# Patient Record
Sex: Female | Born: 1935 | Race: White | Hispanic: No | Marital: Married | Smoking: Never smoker
Health system: Southern US, Community
[De-identification: ages and names within clinical notes are randomized; demographics above are authoritative.]

## PROBLEM LIST (undated history)

## (undated) DIAGNOSIS — R29898 Other symptoms and signs involving the musculoskeletal system: Secondary | ICD-10-CM

## (undated) DIAGNOSIS — T8859XA Other complications of anesthesia, initial encounter: Secondary | ICD-10-CM

## (undated) DIAGNOSIS — I5020 Unspecified systolic (congestive) heart failure: Secondary | ICD-10-CM

## (undated) DIAGNOSIS — R0609 Other forms of dyspnea: Secondary | ICD-10-CM

## (undated) DIAGNOSIS — H269 Unspecified cataract: Secondary | ICD-10-CM

## (undated) DIAGNOSIS — N189 Chronic kidney disease, unspecified: Secondary | ICD-10-CM

## (undated) DIAGNOSIS — Z7901 Long term (current) use of anticoagulants: Secondary | ICD-10-CM

## (undated) DIAGNOSIS — R0600 Dyspnea, unspecified: Secondary | ICD-10-CM

## (undated) DIAGNOSIS — I499 Cardiac arrhythmia, unspecified: Secondary | ICD-10-CM

## (undated) DIAGNOSIS — E119 Type 2 diabetes mellitus without complications: Secondary | ICD-10-CM

## (undated) DIAGNOSIS — Z9889 Other specified postprocedural states: Secondary | ICD-10-CM

## (undated) DIAGNOSIS — I4891 Unspecified atrial fibrillation: Secondary | ICD-10-CM

## (undated) DIAGNOSIS — M1990 Unspecified osteoarthritis, unspecified site: Secondary | ICD-10-CM

## (undated) DIAGNOSIS — Z87440 Personal history of urinary (tract) infections: Secondary | ICD-10-CM

## (undated) DIAGNOSIS — I509 Heart failure, unspecified: Secondary | ICD-10-CM

## (undated) DIAGNOSIS — Z794 Long term (current) use of insulin: Secondary | ICD-10-CM

## (undated) DIAGNOSIS — B019 Varicella without complication: Secondary | ICD-10-CM

## (undated) DIAGNOSIS — G709 Myoneural disorder, unspecified: Secondary | ICD-10-CM

## (undated) DIAGNOSIS — M48061 Spinal stenosis, lumbar region without neurogenic claudication: Secondary | ICD-10-CM

## (undated) DIAGNOSIS — I700 Atherosclerosis of aorta: Secondary | ICD-10-CM

## (undated) DIAGNOSIS — N184 Chronic kidney disease, stage 4 (severe): Secondary | ICD-10-CM

## (undated) DIAGNOSIS — I447 Left bundle-branch block, unspecified: Secondary | ICD-10-CM

## (undated) DIAGNOSIS — N390 Urinary tract infection, site not specified: Secondary | ICD-10-CM

## (undated) DIAGNOSIS — M519 Unspecified thoracic, thoracolumbar and lumbosacral intervertebral disc disorder: Secondary | ICD-10-CM

## (undated) DIAGNOSIS — E785 Hyperlipidemia, unspecified: Secondary | ICD-10-CM

## (undated) DIAGNOSIS — I480 Paroxysmal atrial fibrillation: Secondary | ICD-10-CM

## (undated) DIAGNOSIS — M4800 Spinal stenosis, site unspecified: Secondary | ICD-10-CM

## (undated) DIAGNOSIS — R32 Unspecified urinary incontinence: Secondary | ICD-10-CM

## (undated) DIAGNOSIS — M5090 Cervical disc disorder, unspecified, unspecified cervical region: Secondary | ICD-10-CM

## (undated) DIAGNOSIS — T4145XA Adverse effect of unspecified anesthetic, initial encounter: Secondary | ICD-10-CM

## (undated) DIAGNOSIS — I10 Essential (primary) hypertension: Secondary | ICD-10-CM

## (undated) DIAGNOSIS — T884XXA Failed or difficult intubation, initial encounter: Secondary | ICD-10-CM

## (undated) DIAGNOSIS — R112 Nausea with vomiting, unspecified: Secondary | ICD-10-CM

## (undated) DIAGNOSIS — Z79891 Long term (current) use of opiate analgesic: Secondary | ICD-10-CM

## (undated) DIAGNOSIS — M5430 Sciatica, unspecified side: Secondary | ICD-10-CM

## (undated) DIAGNOSIS — K439 Ventral hernia without obstruction or gangrene: Secondary | ICD-10-CM

## (undated) DIAGNOSIS — D649 Anemia, unspecified: Secondary | ICD-10-CM

## (undated) HISTORY — PX: CATARACT EXTRACTION, BILATERAL: SHX1313

## (undated) HISTORY — PX: EYE SURGERY: SHX253

## (undated) HISTORY — PX: SHOULDER SURGERY: SHX246

## (undated) HISTORY — PX: CARDIOVERSION: SHX1299

## (undated) HISTORY — PX: TONSILLECTOMY: SUR1361

## (undated) HISTORY — PX: JOINT REPLACEMENT: SHX530

## (undated) HISTORY — PX: CERVICAL LAMINECTOMY: SHX94

## (undated) HISTORY — PX: BACK SURGERY: SHX140

---
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
## 2005-05-07 ENCOUNTER — Ambulatory Visit: Payer: Self-pay | Admitting: Internal Medicine

## 2005-06-16 ENCOUNTER — Ambulatory Visit: Payer: Self-pay | Admitting: Internal Medicine

## 2006-08-04 ENCOUNTER — Ambulatory Visit: Payer: Self-pay | Admitting: Internal Medicine

## 2007-04-21 ENCOUNTER — Ambulatory Visit: Payer: Self-pay | Admitting: Internal Medicine

## 2007-11-10 ENCOUNTER — Ambulatory Visit: Payer: Self-pay | Admitting: Internal Medicine

## 2008-01-11 ENCOUNTER — Other Ambulatory Visit: Payer: Self-pay

## 2008-01-11 ENCOUNTER — Ambulatory Visit: Payer: Self-pay | Admitting: Unknown Physician Specialty

## 2008-01-17 ENCOUNTER — Ambulatory Visit: Payer: Self-pay | Admitting: Unknown Physician Specialty

## 2008-12-28 HISTORY — PX: ROTATOR CUFF REPAIR: SHX139

## 2009-06-20 ENCOUNTER — Ambulatory Visit: Payer: Self-pay | Admitting: Internal Medicine

## 2009-12-11 ENCOUNTER — Ambulatory Visit: Payer: Self-pay | Admitting: Ophthalmology

## 2010-01-15 ENCOUNTER — Ambulatory Visit: Payer: Self-pay | Admitting: Internal Medicine

## 2010-01-28 HISTORY — PX: LATERAL FUSION LUMBAR SPINE, TRANSVERSE: SUR635

## 2010-02-07 ENCOUNTER — Ambulatory Visit: Payer: Self-pay | Admitting: Unknown Physician Specialty

## 2010-02-13 ENCOUNTER — Inpatient Hospital Stay: Payer: Self-pay | Admitting: Unknown Physician Specialty

## 2010-07-10 ENCOUNTER — Ambulatory Visit: Payer: Self-pay | Admitting: Internal Medicine

## 2011-10-01 ENCOUNTER — Ambulatory Visit: Payer: Self-pay | Admitting: Internal Medicine

## 2012-04-19 ENCOUNTER — Inpatient Hospital Stay: Payer: Self-pay | Admitting: Internal Medicine

## 2012-04-19 LAB — PROTIME-INR: INR: 0.9

## 2012-04-19 LAB — COMPREHENSIVE METABOLIC PANEL
Albumin: 3.8 g/dL (ref 3.4–5.0)
BUN: 25 mg/dL — ABNORMAL HIGH (ref 7–18)
Bilirubin,Total: 0.8 mg/dL (ref 0.2–1.0)
Calcium, Total: 9 mg/dL (ref 8.5–10.1)
Co2: 28 mmol/L (ref 21–32)
Creatinine: 1 mg/dL (ref 0.60–1.30)
EGFR (African American): >60
Glucose: 183 mg/dL — ABNORMAL HIGH (ref 65–99)
Osmolality: 285 (ref 275–301)
Potassium: 4 mmol/L (ref 3.5–5.1)
SGOT(AST): 9 U/L — ABNORMAL LOW (ref 15–37)
Sodium: 138 mmol/L (ref 136–145)

## 2012-04-19 LAB — TROPONIN I
Troponin-I: <0.02 ng/mL
Troponin-I: <0.02 ng/mL

## 2012-04-19 LAB — TSH: Thyroid Stimulating Horm: 0.886 u[IU]/mL

## 2012-04-19 LAB — CBC WITH DIFFERENTIAL/PLATELET
Basophil #: 0.1 10*3/uL (ref 0.0–0.1)
Eosinophil #: 0.4 10*3/uL (ref 0.0–0.7)
Lymphocyte #: 2.2 10*3/uL (ref 1.0–3.6)
MCH: 26.9 pg (ref 26.0–34.0)
MCV: 82 fL (ref 80–100)
Neutrophil #: 17.5 10*3/uL — ABNORMAL HIGH (ref 1.4–6.5)
RDW: 15.7 % — ABNORMAL HIGH (ref 11.5–14.5)
WBC: 21.4 10*3/uL — ABNORMAL HIGH (ref 3.6–11.0)

## 2012-04-19 LAB — APTT: Activated PTT: 24.9 secs (ref 23.6–35.9)

## 2012-04-19 LAB — MAGNESIUM: Magnesium: 1.6 mg/dL — ABNORMAL LOW

## 2012-04-19 LAB — CK TOTAL AND CKMB (NOT AT ARMC): CK, Total: 14 U/L — ABNORMAL LOW (ref 21–215)

## 2012-04-20 LAB — BASIC METABOLIC PANEL
Anion Gap: 8 (ref 7–16)
Calcium, Total: 8.7 mg/dL (ref 8.5–10.1)
Chloride: 106 mmol/L (ref 98–107)
Co2: 26 mmol/L (ref 21–32)
Creatinine: 0.86 mg/dL (ref 0.60–1.30)
EGFR (African American): >60
Glucose: 163 mg/dL — ABNORMAL HIGH (ref 65–99)
Sodium: 140 mmol/L (ref 136–145)

## 2012-04-20 LAB — URINALYSIS, COMPLETE
Bacteria: NONE SEEN
Blood: NEGATIVE
Glucose,UR: NEGATIVE mg/dL (ref 0–75)
Ketone: NEGATIVE
Leukocyte Esterase: NEGATIVE
Nitrite: NEGATIVE
RBC,UR: NONE SEEN /HPF (ref 0–5)
Specific Gravity: 1.008 (ref 1.003–1.030)
Squamous Epithelial: <1
WBC UR: 1 /HPF (ref 0–5)

## 2012-04-20 LAB — CBC WITH DIFFERENTIAL/PLATELET
Basophil %: 0.6 %
Eosinophil #: 0.5 10*3/uL (ref 0.0–0.7)
Lymphocyte #: 2.8 10*3/uL (ref 1.0–3.6)
Lymphocyte %: 18.9 %
MCH: 27.3 pg (ref 26.0–34.0)
MCHC: 33.3 g/dL (ref 32.0–36.0)
Monocyte #: 0.8 x10 3/mm (ref 0.2–0.9)
Monocyte %: 5.3 %
Neutrophil #: 10.8 10*3/uL — ABNORMAL HIGH (ref 1.4–6.5)
Neutrophil %: 71.7 %

## 2012-04-20 LAB — CK TOTAL AND CKMB (NOT AT ARMC)
CK, Total: 16 U/L — ABNORMAL LOW (ref 21–215)
CK-MB: <0.5 ng/mL — ABNORMAL LOW (ref 0.5–3.6)

## 2012-04-20 LAB — TROPONIN I: Troponin-I: <0.02 ng/mL

## 2012-04-21 LAB — CBC WITH DIFFERENTIAL/PLATELET
Basophil #: 0.1 10*3/uL (ref 0.0–0.1)
Basophil %: 1 %
Eosinophil #: 0.3 10*3/uL (ref 0.0–0.7)
Eosinophil %: 4.5 %
HCT: 34.2 % — ABNORMAL LOW (ref 35.0–47.0)
HGB: 11.6 g/dL — ABNORMAL LOW (ref 12.0–16.0)
Monocyte #: 0.5 x10 3/mm (ref 0.2–0.9)
Monocyte %: 7.4 %
Neutrophil #: 3.5 10*3/uL (ref 1.4–6.5)
Neutrophil %: 51.3 %
Platelet: 180 10*3/uL (ref 150–440)
RBC: 4.15 10*6/uL (ref 3.80–5.20)

## 2012-05-17 ENCOUNTER — Ambulatory Visit: Payer: Self-pay | Admitting: Cardiology

## 2012-06-14 ENCOUNTER — Ambulatory Visit: Payer: Self-pay | Admitting: Internal Medicine

## 2013-01-11 ENCOUNTER — Ambulatory Visit: Payer: Self-pay | Admitting: Internal Medicine

## 2014-01-17 ENCOUNTER — Ambulatory Visit: Payer: Self-pay | Admitting: Internal Medicine

## 2015-01-24 DIAGNOSIS — N186 End stage renal disease: Secondary | ICD-10-CM

## 2015-02-01 ENCOUNTER — Ambulatory Visit: Payer: Self-pay | Admitting: Cardiology

## 2015-02-06 ENCOUNTER — Ambulatory Visit: Payer: Self-pay | Admitting: Internal Medicine

## 2015-02-27 DIAGNOSIS — I2510 Atherosclerotic heart disease of native coronary artery without angina pectoris: Secondary | ICD-10-CM

## 2015-02-27 HISTORY — DX: Atherosclerotic heart disease of native coronary artery without angina pectoris: I25.10

## 2015-03-13 ENCOUNTER — Ambulatory Visit: Payer: Self-pay | Admitting: Cardiology

## 2015-03-13 HISTORY — PX: LEFT HEART CATH AND CORONARY ANGIOGRAPHY: CATH118249

## 2015-04-17 ENCOUNTER — Ambulatory Visit
Admit: 2015-04-17 | Disposition: A | Discharge status: Home / Self Care | Payer: Self-pay | Attending: Rheumatology | Admitting: Rheumatology

## 2015-04-21 NOTE — Consult Note (Signed)
General Aspect patient is a 81 year old female with history of diabetes, hyperlipidemia and hypertension as well as spinal stenosis who was admitted with atrial fibrillation with rapid ventricular response.  Patient said palpitations the past but no sustained atrial fibrillation and was able to be documented.  Her husband was a physician has noted irregular heartbeats in the past, however prior to getting to where elective cartographic to be taken, the patient always converted to sinus rhythm.  She currently remains in atrial fibrillation with controlled ventricular response.  She has no obvious contraindications to anticoagulation.  She is ruled out for myocardial infarction is currently stable however feels fatigue.  She states his fatigue has been present for several days to week.   Physical Exam:   GEN no acute distress    HEENT PERRL    NECK supple  No masses    RESP clear BS    CARD Irregular rate and rhythm  Tachycardic  Murmur    Murmur Systolic    Systolic Murmur axilla    ABD denies tenderness  normal BS    LYMPH negative neck, negative axillae    EXTR negative cyanosis/clubbing, negative edema    SKIN normal to palpation    NEURO cranial nerves intact, motor/sensory function intact    PSYCH A+O to time, place, person   Review of Systems:   Subjective/Chief Complaint weakness, fatigue and rapid heart rate    General: Fatigue  Weakness    Skin: No Complaints    ENT: No Complaints    Eyes: No Complaints    Neck: No Complaints    Respiratory: Short of breath    Cardiovascular: Palpitations    Gastrointestinal: No Complaints    Genitourinary: No Complaints    Vascular: No Complaints    Musculoskeletal: No Complaints    Neurologic: No Complaints    Hematologic: No Complaints    Endocrine: No Complaints    Psychiatric: No Complaints    Review of Systems: All other systems were reviewed and found to be negative    Medications/Allergies Reviewed  Medications/Allergies reviewed     Hypertension:    Back Pain, Chronic:    Diabetes Mellitus, Type II (NIDD):    Cervical Disk Surgery:    Rotator Cuff Surgery:    Cataract Extraction:   EKG:   Interpretation aatrial fibrillation with variable ventricular response    Codeine: N/V/Diarrhea    Impression 81 yo  female with no documented atrial fibrillation who was admitted with atrial fibrillation with variable ventricular response.  Patient states she has been weak and fatigued for several days.  Today she noted rapid irregular heartbeat.  She presented to her primary care provider's office and was noted B&H fibrillation with rapid ventricular response.  She was admitted for rate control and further workup of the etiology of her atrial fibrillation.  She does have some weakness and fatigue and mild shortness of breath but no obvious chest discomfort.  She is ruled out for myocardial infarction thus far.  We'll need to evaluate for evidence of structural and/or valvular and/or ischemic heart disease.  She was an echocardiogram as well as a functional study will continue control rate with beta blockers and consider chronic anticoagulation with Coumadin versus newer anticoagulant agents.    Plan 1.  Continue to control rate as you're doing 2.  IV heparin with conversion to oral Xarelto at time of discharge 3.  Echocardiogram to evaluate for structural or valvular disease 4.  Functional study to  assess for ischemic etiology of symptoms 5.  Rule out for myocardial infarction 6.  Further recommendations pending patient's course   Electronic Signatures: Teodoro Spray (MD)  (Signed 15-May-13 09:34)  Authored: General Aspect/Present Illness, History and Physical Exam, Review of System, Past Medical History, EKG , Allergies, Impression/Plan   Last Updated: 15-May-13 09:34 by Teodoro Spray (MD)

## 2015-04-21 NOTE — Discharge Summary (Signed)
PATIENT NAME:  Tiffany [name], Tiffany [name] MR#:  [id number] DATE OF BIRTH:  10/31/1933  DATE OF ADMISSION:  04/19/2012 DATE OF DISCHARGE:  04/21/2012   FINAL DIAGNOSES:  1. Atrial fibrillation with rapid ventricular rate.  2. Hypertension.  3. Hyperlipidemia.  4. Diabetes mellitus, uncontrolled.  5. Spinal stenosis with radiculopathy.   HISTORY AND PHYSICAL: Please see dictated admission history and physical.   Kelly: The patient was admitted with atrial fibrillation, worrisome for unstable angina given the nausea and fatigue that she was experiencing. Cardiac enzymes were followed, which were negative. She underwent echocardiogram which revealed preserved LV function. She underwent Myoview Lexiscan, which was negative for ischemia. Cardiology saw the patient and medications were adjusted for rate control in hopes that she would convert. Her rate improved into the 80's and 90's but she remained in atrial fibrillation. She was initially placed on Lovenox and was then started on Xarelto secondary to her increased stroke risk, [place] score of 3. She tolerated this well. With her heart rate in the 80's, she was able to ambulate with much improved energy level with resolution of the nausea. At this time we will anticipate having her discharged to home in stable condition with her physical activity to be up as tolerated. She will follow a 2 gram sodium, 1800 calorie ADA diet. We will anticipate her following up with Dr. Ubaldo Glassing in the next few weeks. Will need to monitor for any bradycardia should she break into normal sinus rhythm. This was discussed with the patient. The increased risk of bleeding and the lack of an anecdote to Xarelto was also discussed with the patient. She will check her blood sugars daily and record these as her blood sugars were uncontrolled during her hospitalization, however, we had to hold her metformin as we were concerned that she might need to go for cardiac catheterization.    DISCHARGE MEDICATIONS:  1. Norco 5/325 mg 1 p.o. q.6 hours p.r.n. severe pain.  2. Ranitidine 150 mg p.o. daily.  3. Lisinopril 40 mg p.o. b.i.d.  4. Lovastatin 40 mg p.o. at bedtime.  5. Lopressor 100 mg p.o. b.i.d.  6. Digoxin 0.125 mg p.o. daily. 7. Xarelto 20 mg p.o. daily.  8. Metformin 500 mg p.o. b.i.d.   DO NOT TAKE: The patient should stop Macrobid, nabumetone, and amlodipine.   Urinalysis was checked. There was no evidence of urinary tract infection. She was noted to have leukocytosis on admission, however, it was thought that this might represent the overall stress of situation and no other clear etiology was found. The white blood cell count normalized over the course of her hospitalization.  ____________________________ Adin Hector, MD bjk:drc D: 04/21/2012 13:19:48 ET T: 04/21/2012 15:36:20 ET JOB#: [id number]  cc: Adin Hector, MD, <Dictator> Ramonita Lab MD ELECTRONICALLY SIGNED 04/22/2012 13:17

## 2015-04-21 NOTE — Consult Note (Signed)
Brief Consult Note: Diagnosis: atrial fibrillation with lbbb.   Patient was seen by consultant.   Recommend further assessment or treatment.   Discussed with Attending MD.   Comments: Pt with history of brief episode of afib in the past now admitted after presenting to pcp office with weakness and dizziness. Noted to be in afib with fairly rapid ventricular response. Denies chest pain. No prior cardiac disease. Being evaluated for spinal stenosis with consideration for surgery in the near future. Will rule out for mi, proceed with echo to evaluate lv and atrial anatomy. Will proceed with functional study in am if she rules out for am. Will need lexiscan stress due to back pain and lbbb. Agree with lovenox. Will defer chronic oral anticoagulation until decisions regarding invasive evalluiatgion and/or surgical intervention is determined. Will control rate. Full note to follow.  Electronic Signatures: Teodoro Spray (MD)  (Signed 23-Apr-13 13:15)  Authored: Brief Consult Note   Last Updated: 23-Apr-13 13:15 by Teodoro Spray (MD)

## 2015-04-21 NOTE — H&P (Signed)
PATIENT NAME:  Tiffany [name], Tiffany [name] MR#:  [id number] DATE OF BIRTH:  21-Nov-1933  DATE OF ADMISSION:  04/19/2012  CHIEF COMPLAINT: Nausea and fatigue.   HISTORY OF PRESENT ILLNESS: 81 year old female with history of diabetes, hypertension, hyperlipidemia, spinal stenosis, who is undergoing evaluation for spinal stenosis through Dr. Mauri Pole. She has had an episode about a year ago where there was a question of whether she was in transient atrial fibrillation, by the time she arrived to the doctor this had resolved. She has history of left bundle branch block. This morning she awoke feeling generally fatigued, with some nausea, without chest pain. She had significant dyspnea on exertion, actually symptoms started yesterday morning. Her husband, who is a retired Engineer, drilling, checked her heart rate noticed it to be irregular and rapid. She came to the office today for evaluation, where EKG revealed atrial fibrillation with rapid ventricular rate, at 113, with left bundle branch block limiting the interpretation of this otherwise. She is admitted now with new diagnosis of atrial fibrillation as well as possible unstable angina, likely rate related.   PAST MEDICAL HISTORY:  1. Degenerative joint disease with spinal stenosis.  2. Diabetes mellitus.  3. Hypertension.  4. Hyperlipidemia.  5. History of anemia.  6. Recurrent urinary tract infections.  7. History of cervical laminectomy.  8. History of right rotator cuff surgery.  9. History of lumbar laminectomy.  10. History of cataract surgery.   ALLERGIES: Codeine causes unknown reaction.   MEDICATIONS:  1. Macrobid 100 mg p.o. b.i.d.  2. Tylenol as needed.  3. Lovastatin 40 mg p.o. at bedtime.  4. Metformin 500 mg p.o. b.i.d.  5. Lopressor 50 mg p.o. b.i.d.  6. Lisinopril 40 mg p.o. b.i.d.  7. Nabumetone 750 mg p.o. daily as needed for pain.  8. Amlodipine 5 mg p.o. daily.  9. Lasix 20 mg p.o. daily as needed for edema.  10. Norco 5/325 mg, 1 p.o.  every six hours p.r.n. severe pain.   SOCIAL HISTORY: No tobacco or alcohol.   FAMILY HISTORY: Patient is adopted.   REVIEW OF SYSTEMS: Please see history of present illness. No vision changes; no dysphagia; no neurologic changes; no bowel or bladder issues. Remainder of complete review of systems is negative.   PHYSICAL EXAMINATION:  VITAL SIGNS: Blood pressure 140/90, temperature 98.8, pulse 113, saturation 97% on room air.   GENERAL: Well-developed, well-nourished female, appears fatigued.   EYES: Pupils round and reactive to light. Extraocular motion intact.   EARS, NOSE, AND THROAT: External examination unremarkable. Oropharynx is moist without lesions.   NECK: Supple. Trachea midline. No thyromegaly.   CARDIOVASCULAR: Irregularly irregular without murmurs, gallops, or rubs. Carotid and radial pulses 2+.   LUNGS: Clear bilaterally. No retractions.   ABDOMEN: Soft, nontender, nondistended. Positive bowel sounds. No guard or rebound.   EXTREMITIES: No clubbing, cyanosis, or significant edema. Arthritic changes present.   NEUROLOGIC: Cranial nerves intact. Right leg antalgia with decreased light touch in the right lower extremity.   DERMATOLOGIC: No significant rashes or nodules.  LYMPH: No cervical or supraclavicular nodes.   IMPRESSION AND PLAN:  1. Atrial fibrillation with rapid ventricular rate/unstable angina. Will increase Lopressor to t.i.d. see if we can break the rhythm. Echocardiogram to be obtained. Will ask cardiology to see her as we need to consider stress testing in the event that she does wind up needing to go for surgery as well as to further investigate source of her atrial fibrillation. Will get electrolytes, thyroid and replace or  adjust any treatment that might be needed based on this.  2. Hypertension. Will hold amlodipine while we adjust the above, allowing [place] to use Cardizem if we need to add this.  3. Diabetes mellitus. Hold metformin in the event that  she winds up needing cardiac catheterization. Cover with sliding scale insulin.  4. History of anemia. Await her CBC. We will tentatively plan to put her on Lovenox for now and will readdress anticoagulation based on blood counts as well as findings on the echocardiogram.  5. Spinal stenosis. Pain control.   ____________________________ Adin Hector, MD bjk:cms D: 04/19/2012 10:24:58 ET T: 04/19/2012 10:53:43 ET JOB#: [id number]  cc: Adin Hector, MD, <Dictator> Ramonita Lab MD ELECTRONICALLY SIGNED 04/22/2012 13:17

## 2015-04-24 DIAGNOSIS — M5416 Radiculopathy, lumbar region: Secondary | ICD-10-CM | POA: Insufficient documentation

## 2016-01-30 DIAGNOSIS — N183 Chronic kidney disease, stage 3 (moderate): Secondary | ICD-10-CM | POA: Diagnosis not present

## 2016-01-30 DIAGNOSIS — M4806 Spinal stenosis, lumbar region: Secondary | ICD-10-CM | POA: Diagnosis not present

## 2016-01-30 DIAGNOSIS — D649 Anemia, unspecified: Secondary | ICD-10-CM | POA: Diagnosis not present

## 2016-01-30 DIAGNOSIS — I10 Essential (primary) hypertension: Secondary | ICD-10-CM | POA: Diagnosis not present

## 2016-01-30 DIAGNOSIS — I480 Paroxysmal atrial fibrillation: Secondary | ICD-10-CM | POA: Diagnosis not present

## 2016-01-30 DIAGNOSIS — E782 Mixed hyperlipidemia: Secondary | ICD-10-CM | POA: Diagnosis not present

## 2016-01-30 DIAGNOSIS — E119 Type 2 diabetes mellitus without complications: Secondary | ICD-10-CM | POA: Diagnosis not present

## 2016-04-02 DIAGNOSIS — M5136 Other intervertebral disc degeneration, lumbar region: Secondary | ICD-10-CM | POA: Diagnosis not present

## 2016-04-02 DIAGNOSIS — M4806 Spinal stenosis, lumbar region: Secondary | ICD-10-CM | POA: Diagnosis not present

## 2016-04-02 DIAGNOSIS — M5416 Radiculopathy, lumbar region: Secondary | ICD-10-CM | POA: Diagnosis not present

## 2016-06-03 DIAGNOSIS — N183 Chronic kidney disease, stage 3 (moderate): Secondary | ICD-10-CM | POA: Diagnosis not present

## 2016-06-03 DIAGNOSIS — D649 Anemia, unspecified: Secondary | ICD-10-CM | POA: Diagnosis not present

## 2016-06-03 DIAGNOSIS — E784 Other hyperlipidemia: Secondary | ICD-10-CM | POA: Diagnosis not present

## 2016-06-03 DIAGNOSIS — I480 Paroxysmal atrial fibrillation: Secondary | ICD-10-CM | POA: Diagnosis not present

## 2016-06-03 DIAGNOSIS — E1122 Type 2 diabetes mellitus with diabetic chronic kidney disease: Secondary | ICD-10-CM | POA: Diagnosis not present

## 2016-06-03 DIAGNOSIS — I10 Essential (primary) hypertension: Secondary | ICD-10-CM | POA: Diagnosis not present

## 2016-06-03 DIAGNOSIS — Z23 Encounter for immunization: Secondary | ICD-10-CM | POA: Diagnosis not present

## 2016-06-03 DIAGNOSIS — M150 Primary generalized (osteo)arthritis: Secondary | ICD-10-CM | POA: Diagnosis not present

## 2016-06-03 DIAGNOSIS — M4806 Spinal stenosis, lumbar region: Secondary | ICD-10-CM | POA: Diagnosis not present

## 2016-06-03 DIAGNOSIS — M1990 Unspecified osteoarthritis, unspecified site: Secondary | ICD-10-CM | POA: Diagnosis not present

## 2016-07-30 DIAGNOSIS — E1165 Type 2 diabetes mellitus with hyperglycemia: Secondary | ICD-10-CM | POA: Diagnosis not present

## 2016-07-30 DIAGNOSIS — E1122 Type 2 diabetes mellitus with diabetic chronic kidney disease: Secondary | ICD-10-CM | POA: Diagnosis not present

## 2016-07-30 DIAGNOSIS — N183 Chronic kidney disease, stage 3 (moderate): Secondary | ICD-10-CM | POA: Diagnosis not present

## 2016-09-02 DIAGNOSIS — E1122 Type 2 diabetes mellitus with diabetic chronic kidney disease: Secondary | ICD-10-CM | POA: Diagnosis not present

## 2016-09-02 DIAGNOSIS — D649 Anemia, unspecified: Secondary | ICD-10-CM | POA: Diagnosis not present

## 2016-09-02 DIAGNOSIS — M1990 Unspecified osteoarthritis, unspecified site: Secondary | ICD-10-CM | POA: Diagnosis not present

## 2016-09-02 DIAGNOSIS — N183 Chronic kidney disease, stage 3 (moderate): Secondary | ICD-10-CM | POA: Diagnosis not present

## 2016-09-02 DIAGNOSIS — I10 Essential (primary) hypertension: Secondary | ICD-10-CM | POA: Diagnosis not present

## 2016-09-18 DIAGNOSIS — E1122 Type 2 diabetes mellitus with diabetic chronic kidney disease: Secondary | ICD-10-CM | POA: Diagnosis not present

## 2016-09-18 DIAGNOSIS — E1165 Type 2 diabetes mellitus with hyperglycemia: Secondary | ICD-10-CM | POA: Diagnosis not present

## 2016-09-18 DIAGNOSIS — N183 Chronic kidney disease, stage 3 (moderate): Secondary | ICD-10-CM | POA: Diagnosis not present

## 2016-09-29 DIAGNOSIS — Z23 Encounter for immunization: Secondary | ICD-10-CM | POA: Diagnosis not present

## 2016-11-02 DIAGNOSIS — N183 Chronic kidney disease, stage 3 (moderate): Secondary | ICD-10-CM | POA: Diagnosis not present

## 2016-11-02 DIAGNOSIS — E1122 Type 2 diabetes mellitus with diabetic chronic kidney disease: Secondary | ICD-10-CM | POA: Diagnosis not present

## 2016-11-02 DIAGNOSIS — E1165 Type 2 diabetes mellitus with hyperglycemia: Secondary | ICD-10-CM | POA: Diagnosis not present

## 2016-11-09 DIAGNOSIS — E119 Type 2 diabetes mellitus without complications: Secondary | ICD-10-CM | POA: Diagnosis not present

## 2016-11-11 DIAGNOSIS — M064 Inflammatory polyarthropathy: Secondary | ICD-10-CM | POA: Diagnosis not present

## 2016-11-11 DIAGNOSIS — M545 Low back pain: Secondary | ICD-10-CM | POA: Diagnosis not present

## 2016-11-11 DIAGNOSIS — E784 Other hyperlipidemia: Secondary | ICD-10-CM | POA: Diagnosis not present

## 2016-11-11 DIAGNOSIS — N183 Chronic kidney disease, stage 3 (moderate): Secondary | ICD-10-CM | POA: Diagnosis not present

## 2016-11-11 DIAGNOSIS — G8929 Other chronic pain: Secondary | ICD-10-CM | POA: Diagnosis not present

## 2016-11-11 DIAGNOSIS — Z780 Asymptomatic menopausal state: Secondary | ICD-10-CM | POA: Diagnosis not present

## 2016-11-11 DIAGNOSIS — I10 Essential (primary) hypertension: Secondary | ICD-10-CM | POA: Diagnosis not present

## 2016-11-11 DIAGNOSIS — E1122 Type 2 diabetes mellitus with diabetic chronic kidney disease: Secondary | ICD-10-CM | POA: Diagnosis not present

## 2016-11-11 DIAGNOSIS — M25512 Pain in left shoulder: Secondary | ICD-10-CM | POA: Diagnosis not present

## 2016-11-11 DIAGNOSIS — Z0000 Encounter for general adult medical examination without abnormal findings: Secondary | ICD-10-CM | POA: Diagnosis not present

## 2016-11-11 DIAGNOSIS — I480 Paroxysmal atrial fibrillation: Secondary | ICD-10-CM | POA: Diagnosis not present

## 2016-11-11 DIAGNOSIS — D649 Anemia, unspecified: Secondary | ICD-10-CM | POA: Diagnosis not present

## 2016-11-26 DIAGNOSIS — Z780 Asymptomatic menopausal state: Secondary | ICD-10-CM | POA: Diagnosis not present

## 2016-12-04 DIAGNOSIS — M5416 Radiculopathy, lumbar region: Secondary | ICD-10-CM | POA: Diagnosis not present

## 2016-12-04 DIAGNOSIS — M48062 Spinal stenosis, lumbar region with neurogenic claudication: Secondary | ICD-10-CM | POA: Diagnosis not present

## 2016-12-04 DIAGNOSIS — M5136 Other intervertebral disc degeneration, lumbar region: Secondary | ICD-10-CM | POA: Diagnosis not present

## 2016-12-30 DIAGNOSIS — E1165 Type 2 diabetes mellitus with hyperglycemia: Secondary | ICD-10-CM | POA: Diagnosis not present

## 2017-01-04 DIAGNOSIS — E1122 Type 2 diabetes mellitus with diabetic chronic kidney disease: Secondary | ICD-10-CM | POA: Diagnosis not present

## 2017-01-04 DIAGNOSIS — Z794 Long term (current) use of insulin: Secondary | ICD-10-CM | POA: Diagnosis not present

## 2017-01-04 DIAGNOSIS — E1165 Type 2 diabetes mellitus with hyperglycemia: Secondary | ICD-10-CM | POA: Diagnosis not present

## 2017-01-04 DIAGNOSIS — N183 Chronic kidney disease, stage 3 (moderate): Secondary | ICD-10-CM | POA: Diagnosis not present

## 2017-01-08 ENCOUNTER — Other Ambulatory Visit: Payer: Self-pay | Admitting: Cardiology

## 2017-01-08 DIAGNOSIS — I10 Essential (primary) hypertension: Secondary | ICD-10-CM | POA: Diagnosis not present

## 2017-01-08 DIAGNOSIS — E784 Other hyperlipidemia: Secondary | ICD-10-CM | POA: Diagnosis not present

## 2017-01-08 DIAGNOSIS — I480 Paroxysmal atrial fibrillation: Secondary | ICD-10-CM | POA: Diagnosis not present

## 2017-01-08 MED ORDER — HEPARIN SODIUM (PORCINE) 1000 UNIT/ML IJ SOLN
INTRAMUSCULAR | Status: AC
  Filled 2017-01-08: qty 1

## 2017-01-08 MED ORDER — CEFAZOLIN IN D5W 1 GM/50ML IV SOLN
INTRAVENOUS | Status: AC
  Filled 2017-01-08: qty 50

## 2017-01-08 MED ORDER — MIDAZOLAM HCL 2 MG/2ML IJ SOLN
INTRAMUSCULAR | Status: AC
Start: 2017-01-08 — End: 2017-01-08
  Filled 2017-01-08: qty 2

## 2017-01-08 MED ORDER — FENTANYL CITRATE (PF) 100 MCG/2ML IJ SOLN
INTRAMUSCULAR | Status: AC
  Filled 2017-01-08: qty 2

## 2017-01-12 ENCOUNTER — Encounter
Admission: RE | Disposition: A | Discharge status: Home / Self Care | Payer: Self-pay | Source: Ambulatory Visit | Attending: Cardiology

## 2017-01-12 ENCOUNTER — Encounter: Payer: Self-pay | Admitting: *Deleted

## 2017-01-12 ENCOUNTER — Ambulatory Visit: Payer: PPO | Admitting: Anesthesiology

## 2017-01-12 ENCOUNTER — Ambulatory Visit
Admission: RE | Admit: 2017-01-12 | Discharge: 2017-01-12 | Disposition: A | Discharge status: Home / Self Care | Payer: PPO | Source: Ambulatory Visit | Attending: Cardiology | Admitting: Cardiology

## 2017-01-12 DIAGNOSIS — Z7901 Long term (current) use of anticoagulants: Secondary | ICD-10-CM | POA: Diagnosis not present

## 2017-01-12 DIAGNOSIS — Z794 Long term (current) use of insulin: Secondary | ICD-10-CM | POA: Insufficient documentation

## 2017-01-12 DIAGNOSIS — E782 Mixed hyperlipidemia: Secondary | ICD-10-CM | POA: Diagnosis not present

## 2017-01-12 DIAGNOSIS — N189 Chronic kidney disease, unspecified: Secondary | ICD-10-CM | POA: Diagnosis not present

## 2017-01-12 DIAGNOSIS — I129 Hypertensive chronic kidney disease with stage 1 through stage 4 chronic kidney disease, or unspecified chronic kidney disease: Secondary | ICD-10-CM | POA: Insufficient documentation

## 2017-01-12 DIAGNOSIS — Z7984 Long term (current) use of oral hypoglycemic drugs: Secondary | ICD-10-CM | POA: Diagnosis not present

## 2017-01-12 DIAGNOSIS — I480 Paroxysmal atrial fibrillation: Secondary | ICD-10-CM | POA: Insufficient documentation

## 2017-01-12 DIAGNOSIS — N183 Chronic kidney disease, stage 3 (moderate): Secondary | ICD-10-CM | POA: Diagnosis not present

## 2017-01-12 DIAGNOSIS — Z79899 Other long term (current) drug therapy: Secondary | ICD-10-CM | POA: Diagnosis not present

## 2017-01-12 DIAGNOSIS — E1122 Type 2 diabetes mellitus with diabetic chronic kidney disease: Secondary | ICD-10-CM | POA: Insufficient documentation

## 2017-01-12 DIAGNOSIS — E785 Hyperlipidemia, unspecified: Secondary | ICD-10-CM | POA: Diagnosis not present

## 2017-01-12 DIAGNOSIS — I4891 Unspecified atrial fibrillation: Secondary | ICD-10-CM | POA: Diagnosis not present

## 2017-01-12 HISTORY — DX: Chronic kidney disease, unspecified: N18.9

## 2017-01-12 HISTORY — DX: Urinary tract infection, site not specified: N39.0

## 2017-01-12 HISTORY — DX: Essential (primary) hypertension: I10

## 2017-01-12 HISTORY — DX: Anemia, unspecified: D64.9

## 2017-01-12 HISTORY — DX: Hyperlipidemia, unspecified: E78.5

## 2017-01-12 HISTORY — DX: Varicella without complication: B01.9

## 2017-01-12 HISTORY — DX: Cardiac arrhythmia, unspecified: I49.9

## 2017-01-12 HISTORY — DX: Unspecified osteoarthritis, unspecified site: M19.90

## 2017-01-12 HISTORY — PX: ELECTROPHYSIOLOGIC STUDY: SHX172A

## 2017-01-12 HISTORY — DX: Other symptoms and signs involving the musculoskeletal system: R29.898

## 2017-01-12 HISTORY — DX: Unspecified cataract: H26.9

## 2017-01-12 HISTORY — DX: Spinal stenosis, site unspecified: M48.00

## 2017-01-12 HISTORY — DX: Type 2 diabetes mellitus without complications: E11.9

## 2017-01-12 HISTORY — DX: Unspecified atrial fibrillation: I48.91

## 2017-01-12 SURGERY — CARDIOVERSION (CATH LAB)
Anesthesia: General

## 2017-01-12 MED ORDER — SODIUM CHLORIDE 0.9 % IV SOLN
250.0000 mL | INTRAVENOUS | Status: DC
  Administered 2017-01-12: 07:00:00 via INTRAVENOUS

## 2017-01-12 MED ORDER — PROPOFOL 500 MG/50ML IV EMUL
INTRAVENOUS | Status: AC
Start: 2017-01-12 — End: 2017-01-12
  Filled 2017-01-12: qty 50

## 2017-01-12 MED ORDER — PROPOFOL 10 MG/ML IV BOLUS
INTRAVENOUS | Status: AC
  Filled 2017-01-12: qty 20

## 2017-01-12 MED ORDER — HYDROCORTISONE 1 % EX CREA
1.0000 "application " | TOPICAL_CREAM | Freq: Three times a day (TID) | CUTANEOUS | Status: DC | PRN
  Filled 2017-01-12: qty 28

## 2017-01-12 MED ORDER — PROPOFOL 10 MG/ML IV BOLUS
INTRAVENOUS | Status: DC | PRN
  Administered 2017-01-12: 50 mg via INTRAVENOUS

## 2017-01-12 MED ORDER — SODIUM CHLORIDE 0.9% FLUSH: 3.0000 mL | INTRAVENOUS | Status: DC | PRN

## 2017-01-12 MED ORDER — SODIUM CHLORIDE 0.9% FLUSH: 3.0000 mL | Freq: Two times a day (BID) | INTRAVENOUS | Status: DC

## 2017-01-12 NOTE — Transfer of Care (Signed)
Immediate Anesthesia Transfer of Care Note  Patient: Tiffany [name]  Procedure(s) Performed: Procedure(s): Cardioversion (N/A)  Patient Location: PACU  Anesthesia Type:General  Level of Consciousness: awake and sedated  Airway & Oxygen Therapy: Patient Spontanous Breathing and Patient connected to nasal cannula oxygen  Post-op Assessment: Report given to RN and Post -op Vital signs reviewed and stable  Post vital signs: Reviewed and stable  Last Vitals:  Vitals:   01/12/17 0717  BP: (!) 149/90  Pulse: 72  Resp: 16    Last Pain:  Vitals:   01/12/17 0713  TempSrc: Oral         Complications: No apparent anesthesia complications

## 2017-01-12 NOTE — CV Procedure (Signed)
Cardioversion  Procedure:  DCCV Indication:  Symptomatic afib Sedation:  Per dept of anesthesia  After informed consent, time out protocol and adequate sedation, pt received a 150J synchronized Biphasic DC shock  With conversion to nsr. No immediate complications.

## 2017-01-12 NOTE — Anesthesia Preprocedure Evaluation (Signed)
Anesthesia Evaluation  Patient identified by MRN, date of birth, ID band Patient awake    Reviewed: Allergy & Precautions, H&P , NPO status , Patient's Chart, lab work & pertinent test results  History of Anesthesia Complications Negative for: history of anesthetic complications  Airway Mallampati: III  TM Distance: <3 FB Neck ROM: limited    Dental  (+) Poor Dentition, Chipped, Caps   Pulmonary neg pulmonary ROS, neg shortness of breath,    Pulmonary exam normal breath sounds clear to auscultation       Cardiovascular Exercise Tolerance: Good hypertension, (-) angina(-) Past MI and (-) DOE Normal cardiovascular exam+ dysrhythmias Atrial Fibrillation  Rhythm:regular Rate:Normal     Neuro/Psych negative neurological ROS  negative psych ROS   GI/Hepatic negative GI ROS, Neg liver ROS,   Endo/Other  diabetes, Type 2, Insulin Dependent  Renal/GU Renal disease  negative genitourinary   Musculoskeletal  (+) Arthritis ,   Abdominal   Peds  Hematology negative hematology ROS (+)   Anesthesia Other Findings Past Medical History: No date: A-fib (HCC) No date: Anemia No date: Arthritis No date: Cataracts, both eyes No date: Chickenpox No date: Chronic kidney disease No date: Diabetes mellitus without complication (HCC) No date: Dysrhythmia No date: Hyperlipidemia No date: Hypertension No date: Right leg weakness No date: Spinal stenosis No date: UTI (urinary tract infection)  Past Surgical History: No date: BACK SURGERY No date: CARDIOVERSION No date: CATARACT EXTRACTION, BILATERAL No date: CERVICAL LAMINECTOMY No date: SHOULDER SURGERY  BMI    Body Mass Index:  31.76 kg/m      Reproductive/Obstetrics negative OB ROS                             Anesthesia Physical Anesthesia Plan  ASA: III  Anesthesia Plan: General   Post-op Pain Management:    Induction:   Airway  Management Planned:   Additional Equipment:   Intra-op Plan:   Post-operative Plan:   Informed Consent: I have reviewed the patients History and Physical, chart, labs and discussed the procedure including the risks, benefits and alternatives for the proposed anesthesia with the patient or authorized representative who has indicated his/her understanding and acceptance.   Dental Advisory Given  Plan Discussed with: Anesthesiologist, CRNA and Surgeon  Anesthesia Plan Comments:         Anesthesia Quick Evaluation

## 2017-01-12 NOTE — Anesthesia Post-op Follow-up Note (Signed)
Anesthesia QCDR form completed.        

## 2017-01-12 NOTE — OR Nursing (Signed)
2 second pauses noted, Dr Ubaldo Glassing notified, continue to monitor for extended time, may discharge at 9 if pt is stable and rhythm remains unchanged

## 2017-01-12 NOTE — Anesthesia Procedure Notes (Signed)
Date/Time: 01/12/2017 7:27 AM Performed by: Nelda Marseille Pre-anesthesia Checklist: Patient identified, Emergency Drugs available, Suction available, Patient being monitored and Timeout performed Oxygen Delivery Method: Nasal cannula

## 2017-01-12 NOTE — Addendum Note (Signed)
Addendum  created 01/12/17 1212 by Nelda Marseille, CRNA   Anesthesia Event edited

## 2017-01-12 NOTE — Anesthesia Postprocedure Evaluation (Signed)
Anesthesia Post Note  Patient: Tiffany [name]  Procedure(s) Performed: Procedure(s) (LRB): Cardioversion (N/A)  Patient location during evaluation: Cath Lab Anesthesia Type: General Level of consciousness: awake and alert Pain management: pain level controlled Vital Signs Assessment: post-procedure vital signs reviewed and stable Respiratory status: spontaneous breathing, nonlabored ventilation, respiratory function stable and patient connected to nasal cannula oxygen Cardiovascular status: blood pressure returned to baseline and stable Postop Assessment: no signs of nausea or vomiting Anesthetic complications: no     Last Vitals:  Vitals:   01/12/17 0845 01/12/17 0900  BP:  (!) 153/66  Pulse: (!) 58 (!) 59  Resp: 17 17    Last Pain:  Vitals:   01/12/17 0713  TempSrc: Oral                 Precious Haws Isidore Margraf

## 2017-01-12 NOTE — H&P (Signed)
Chief Complaint: Chief Complaint  Patient presents with  . Atrial Fibrillation  woke up with it  . Fatigue  when I have the afib Im just weak  . Shortness of Breath  I do have alittle  Date of Service: 01/08/2017 Date of Birth: [birth year] PCP: Cheryll Cockayne, MD  History of Present Illness: Tiffany [name] is a 83 y.o.female patient who returns for a: Follow-up visit. She has been in sinus rhythm after cardioversion on low-dose flecainide which she has been on for some time along with beta-blocker. She felt somewhat weak and felt that she may be back in atrial fibrillation. Electrocardiogram today reveals atrial fibrillation at a rate of 61 with a QRS duration 164 ms a QTC of 450 ms with QRS axis -39. She denies syncope chest pain but does have some weakness and fatigue. Her rate is well controlled.  Past Medical and Surgical History  Past Medical History Past Medical History:  Diagnosis Date  . Anemia  . Atrial fibrillation (CMS-HCC)  . Bilateral cataracts  . Chickenpox  . Chronic kidney disease, stage III  . DOE (dyspnea on exertion) 02/04/2015  . Hyperlipidemia  . Hypertension  . Osteoarthritis  . Right leg weakness  . Spinal stenosis  . Type 2 diabetes mellitus (CMS-HCC)  . Urinary tract bacterial infections   Past Surgical History She has a past surgical history that includes Back surgery (02/03/10); Shoulder arthroscopy distal clavicle excision and open rotator cuff repair; Cataract extraction (Bilateral); Cervical laminectomy; and Cardioversion External.   Medications and Allergies  Current Medications  Current Outpatient Prescriptions  Medication Sig Dispense Refill  . CONCENTRATED insulin glargine (TOUJEO SOLOSTAR) pen injector (concentration 300 units/mL) Inject 20 Units subcutaneously nightly. 4.5 mL 5  . DIGITEK 125 mcg tablet take 1 tablet by mouth once daily 90 tablet 4  . flecainide (TAMBOCOR) 100 MG tablet take 1/2 tablet by mouth twice a day 60 tablet 1  .  glimepiride (AMARYL) 2 MG tablet Take 1 tablet (2 mg total) by mouth daily with breakfast. 90 tablet 3  . HYDROcodone-acetaminophen (NORCO) 5-325 mg tablet 1/2-1 po bid prn 20 tablet 0  . ibuprofen (ADVIL,MOTRIN) 200 MG tablet Take 200 mg by mouth 2 (two) times daily.  . insulin ASPART (NOVOLOG FLEXPEN) pen injector (concentration 100 units/mL) Take 10 units before supper. Take 10 minutes before the meal. 15 mL 12  . MELATONIN ORAL Take 10 mg by mouth once daily. One at night   . metoprolol succinate (TOPROL-XL) 50 MG XL tablet take 1 tablet by mouth once daily 90 tablet 4  . valsartan-hydrochlorothiazide (DIOVAN-HCT) 320-12.5 mg tablet take 1 tablet by mouth once daily 90 tablet 4  . XARELTO 20 mg tablet take 1 tablet by mouth once daily with BREAKFAST 90 tablet 4  . blood glucose diagnostic (ONETOUCH VERIO) test strip Test TWICE daily 100 each 3   No current facility-administered medications for this visit.   Allergies: Amlodipine and Codeine  Social and Family History  Social History reports that she has never smoked. She has never used smokeless tobacco. She reports that she does not drink alcohol or use drugs.  Family History Family History  Problem Relation Age of Onset  . Adopted: Yes  . Family history unknown: Yes   Review of Systems  Review of Systems  Constitutional: Negative for chills, diaphoresis, fever and weight loss.  HENT: Negative for congestion, ear discharge, hearing loss and tinnitus.  Eyes: Negative for blurred vision.  Respiratory: Negative for cough,  hemoptysis, sputum production, shortness of breath and wheezing.  Cardiovascular: Negative for chest pain, palpitations, orthopnea, claudication, leg swelling and PND.  Gastrointestinal: Negative for abdominal pain, blood in stool, constipation, diarrhea, heartburn, melena, nausea and vomiting.  Genitourinary: Negative for dysuria, frequency, hematuria and urgency.  Musculoskeletal: Negative for back pain, falls,  joint pain and myalgias.  Skin: Negative for itching and rash.  Neurological: Negative for dizziness, tingling, focal weakness, loss of consciousness and headaches.  Endo/Heme/Allergies: Negative for polydipsia. Does not bruise/bleed easily.  Psychiatric/Behavioral: Negative for depression, memory loss and substance abuse. The patient is not nervous/anxious.    Physical Examination   Vitals:BP 110/68  Pulse 84  Resp 12  Ht 162.6 cm (5\' 4" )  Wt 84.4 kg (186 lb)  BMI 31.93 kg/m  Ht:162.6 cm (5\' 4" ) Wt:84.4 kg (186 lb) STM:HDQQ surface area is 1.95 meters squared. Body mass index is 31.93 kg/m.  Wt Readings from Last 3 Encounters:  01/08/17 84.4 kg (186 lb)  01/04/17 84.4 kg (186 lb)  12/04/16 84.4 kg (186 lb)   BP Readings from Last 3 Encounters:  01/08/17 110/68  01/04/17 144/82  12/04/16 (!) 182/72   General appearance appears in no acute distress  Head Mouth and Eye exam Normocephalic, without obvious abnormality, atraumatic Dentition is good Eyes appear anicteric   Neck exam Thyroid: normal  Nodes: no obvious adenopathy  LUNGS Breath Sounds: Normal Percussion: Normal  CARDIOVASCULAR JVP CV wave: no HJR: no Elevation at 90 degrees: None Carotid Pulse: normal pulsation bilaterally Bruit: None Apex: apical impulse normal  Auscultation Rhythm: atrial fibrillation S1: normal S2: normal Clicks: no Rub: no Murmurs: no murmurs  Gallop: None ABDOMEN Liver enlargement: no Pulsatile aorta: no Ascites: no Bruits: no  EXTREMITIES Clubbing: no Edema: trace to 1+ bilateral pedal edema Pulses: peripheral pulses symmetrical Femoral Bruits: no Amputation: no SKIN Rash: no Cyanosis: no Embolic phemonenon: no Bruising: no NEURO Alert and Oriented to person, place and time: yes Non focal: yes  PSYCH: Pt appears to have normal affect  LABS REVIEWED Last 3 CBC results: Lab Results  Component Value Date  WBC 7.3 09/02/2016  WBC 9.3 06/03/2016   WBC 9.5 01/30/2016   Lab Results  Component Value Date  HGB 11.5 (L) 09/02/2016  HGB 12.6 06/03/2016  HGB 12.5 01/30/2016   Lab Results  Component Value Date  HCT 34.0 (L) 09/02/2016  HCT 38.1 06/03/2016  HCT 38.1 01/30/2016   Lab Results  Component Value Date  PLT 244 09/02/2016  PLT 248 06/03/2016  PLT 269 01/30/2016   Lab Results  Component Value Date  CREATININE 1.6 (H) 12/30/2016  BUN 41 (H) 12/30/2016  NA 141 12/30/2016  K 4.3 12/30/2016  CL 102 12/30/2016  CO2 29.4 12/30/2016   Lab Results  Component Value Date  HGBA1C 8.5 (H) 12/30/2016   Lab Results  Component Value Date  ALT 14 09/02/2016  AST 10 09/02/2016  ALKPHOS 56 09/02/2016   Lab Results  Component Value Date  TSH 2.054 06/03/2016   Assessment and Plan   83 y.o. female with  ICD-10-CM ICD-9-CM  1. Essential hypertension-blood pressure is controlled currently with metoprolol as well as valsartan-hydrochlorothiazide. Will continue with this and dash diet. I10 401.9  2. Paroxysmal atrial fibrillation-currently back in recurrent atrial fibrillation with 50 mg twice daily of flecainide and metoprolol . Will continue to anticoagulate with Xarelto. We will proceed with reattempt at cardioversion with further recommendations based on results of this. Will increase flecainide to 100 twice daily. May  need to alter medications as patient gets older due to side effects however for now given her tolerance will continue with this. I48.0 427.31  3. Mixed hyperlipidemia the-continue with diet control E78.2 272.2  4. CKD (chronic kidney disease) stage 3, GFR 30-59 ml/min the-currently stable N18.3 585.3  5. Diabetes mellitus type 2, uncomplicated-continue with metformin and glimepiride with a hemoglobin A1c goal of less than 6. E11.9 250.00   Return in about 4 weeks (around 02/05/2017).  These notes generated with voice recognition software. I apologize for typographical errors.  Sydnee Levans, MD    H  and P reviewed. No changes.

## 2017-01-12 NOTE — Discharge Instructions (Signed)
Electrical Cardioversion, Care After °This sheet gives you information about how to care for yourself after your procedure. Your health care provider may also give you more specific instructions. If you have problems or questions, contact your health care provider. °What can I expect after the procedure? °After the procedure, it is common to have: °· Some redness on the skin where the shocks were given. °Follow these instructions at home: °· Do not drive for 24 hours if you were given a medicine to help you relax (sedative). °· Take over-the-counter and prescription medicines only as told by your health care provider. °· Ask your health care provider how to check your pulse. Check it often. °· Rest for 48 hours after the procedure or as told by your health care provider. °· Avoid or limit your caffeine use as told by your health care provider. °Contact a health care provider if: °· You feel like your heart is beating too quickly or your pulse is not regular. °· You have a serious muscle cramp that does not go away. °Get help right away if: °· You have discomfort in your chest. °· You are dizzy or you feel faint. °· You have trouble breathing or you are short of breath. °· Your speech is slurred. °· You have trouble moving an arm or leg on one side of your body. °· Your fingers or toes turn cold or blue. °This information is not intended to replace advice given to you by your health care provider. Make sure you discuss any questions you have with your health care provider. °Document Released: 10/04/2013 Document Revised: 07/17/2016 Document Reviewed: 06/19/2016 °Elsevier Interactive Patient Education © 2017 [employer] ° °

## 2017-01-18 DIAGNOSIS — I10 Essential (primary) hypertension: Secondary | ICD-10-CM | POA: Diagnosis not present

## 2017-01-18 DIAGNOSIS — I4891 Unspecified atrial fibrillation: Secondary | ICD-10-CM | POA: Diagnosis not present

## 2017-01-18 DIAGNOSIS — E784 Other hyperlipidemia: Secondary | ICD-10-CM | POA: Diagnosis not present

## 2017-01-28 NOTE — Discharge Summary (Signed)
Patient with symptomatic recurrent atrial fibrillation. She was treated with flecainide and anticoagulated with xarelto.  She underwent cardioversion to normal sinus rhythm. Were no complications. Patient was discharged on current home medications. Follow-up in one week

## 2017-03-05 DIAGNOSIS — Z794 Long term (current) use of insulin: Secondary | ICD-10-CM | POA: Diagnosis not present

## 2017-03-05 DIAGNOSIS — N183 Chronic kidney disease, stage 3 (moderate): Secondary | ICD-10-CM | POA: Diagnosis not present

## 2017-03-05 DIAGNOSIS — I10 Essential (primary) hypertension: Secondary | ICD-10-CM | POA: Diagnosis not present

## 2017-03-05 DIAGNOSIS — E1122 Type 2 diabetes mellitus with diabetic chronic kidney disease: Secondary | ICD-10-CM | POA: Diagnosis not present

## 2017-06-17 DIAGNOSIS — Z794 Long term (current) use of insulin: Secondary | ICD-10-CM | POA: Diagnosis not present

## 2017-06-17 DIAGNOSIS — N183 Chronic kidney disease, stage 3 (moderate): Secondary | ICD-10-CM | POA: Diagnosis not present

## 2017-06-17 DIAGNOSIS — E1122 Type 2 diabetes mellitus with diabetic chronic kidney disease: Secondary | ICD-10-CM | POA: Diagnosis not present

## 2017-06-24 DIAGNOSIS — I10 Essential (primary) hypertension: Secondary | ICD-10-CM | POA: Diagnosis not present

## 2017-06-24 DIAGNOSIS — E1122 Type 2 diabetes mellitus with diabetic chronic kidney disease: Secondary | ICD-10-CM | POA: Diagnosis not present

## 2017-06-24 DIAGNOSIS — Z794 Long term (current) use of insulin: Secondary | ICD-10-CM | POA: Diagnosis not present

## 2017-06-24 DIAGNOSIS — N183 Chronic kidney disease, stage 3 (moderate): Secondary | ICD-10-CM | POA: Diagnosis not present

## 2017-08-16 ENCOUNTER — Other Ambulatory Visit: Payer: Self-pay | Admitting: Internal Medicine

## 2017-08-16 DIAGNOSIS — I10 Essential (primary) hypertension: Secondary | ICD-10-CM | POA: Diagnosis not present

## 2017-08-16 DIAGNOSIS — N183 Chronic kidney disease, stage 3 (moderate): Secondary | ICD-10-CM | POA: Diagnosis not present

## 2017-08-16 DIAGNOSIS — W1800XA Striking against unspecified object with subsequent fall, initial encounter: Secondary | ICD-10-CM | POA: Diagnosis not present

## 2017-08-16 DIAGNOSIS — S199XXA Unspecified injury of neck, initial encounter: Secondary | ICD-10-CM | POA: Diagnosis not present

## 2017-08-16 DIAGNOSIS — M542 Cervicalgia: Secondary | ICD-10-CM | POA: Diagnosis not present

## 2017-08-16 DIAGNOSIS — D649 Anemia, unspecified: Secondary | ICD-10-CM | POA: Diagnosis not present

## 2017-08-16 DIAGNOSIS — M48061 Spinal stenosis, lumbar region without neurogenic claudication: Secondary | ICD-10-CM | POA: Diagnosis not present

## 2017-08-16 DIAGNOSIS — I480 Paroxysmal atrial fibrillation: Secondary | ICD-10-CM | POA: Diagnosis not present

## 2017-08-16 DIAGNOSIS — E11621 Type 2 diabetes mellitus with foot ulcer: Secondary | ICD-10-CM | POA: Diagnosis not present

## 2017-08-16 DIAGNOSIS — M064 Inflammatory polyarthropathy: Secondary | ICD-10-CM | POA: Diagnosis not present

## 2017-08-16 DIAGNOSIS — M5416 Radiculopathy, lumbar region: Secondary | ICD-10-CM | POA: Diagnosis not present

## 2017-08-16 DIAGNOSIS — Z1231 Encounter for screening mammogram for malignant neoplasm of breast: Secondary | ICD-10-CM

## 2017-08-16 DIAGNOSIS — Z0000 Encounter for general adult medical examination without abnormal findings: Secondary | ICD-10-CM | POA: Diagnosis not present

## 2017-08-16 DIAGNOSIS — E784 Other hyperlipidemia: Secondary | ICD-10-CM | POA: Diagnosis not present

## 2017-08-16 DIAGNOSIS — E1122 Type 2 diabetes mellitus with diabetic chronic kidney disease: Secondary | ICD-10-CM | POA: Diagnosis not present

## 2017-08-27 ENCOUNTER — Encounter: Payer: Self-pay | Admitting: Radiology

## 2017-08-27 ENCOUNTER — Ambulatory Visit
Admission: RE | Admit: 2017-08-27 | Discharge: 2017-08-27 | Disposition: A | Discharge status: Home / Self Care | Payer: PPO | Source: Ambulatory Visit | Attending: Internal Medicine | Admitting: Internal Medicine

## 2017-08-27 DIAGNOSIS — R928 Other abnormal and inconclusive findings on diagnostic imaging of breast: Secondary | ICD-10-CM | POA: Diagnosis not present

## 2017-08-27 DIAGNOSIS — Z1231 Encounter for screening mammogram for malignant neoplasm of breast: Secondary | ICD-10-CM | POA: Insufficient documentation

## 2017-08-27 DIAGNOSIS — R921 Mammographic calcification found on diagnostic imaging of breast: Secondary | ICD-10-CM | POA: Insufficient documentation

## 2017-09-01 ENCOUNTER — Other Ambulatory Visit: Payer: Self-pay | Admitting: Internal Medicine

## 2017-09-01 DIAGNOSIS — R921 Mammographic calcification found on diagnostic imaging of breast: Secondary | ICD-10-CM

## 2017-09-01 DIAGNOSIS — R928 Other abnormal and inconclusive findings on diagnostic imaging of breast: Secondary | ICD-10-CM

## 2017-09-09 ENCOUNTER — Ambulatory Visit
Admission: RE | Admit: 2017-09-09 | Discharge: 2017-09-09 | Disposition: A | Discharge status: Home / Self Care | Payer: PPO | Source: Ambulatory Visit | Attending: Internal Medicine | Admitting: Internal Medicine

## 2017-09-09 ENCOUNTER — Other Ambulatory Visit: Payer: Self-pay | Admitting: Internal Medicine

## 2017-09-09 DIAGNOSIS — R921 Mammographic calcification found on diagnostic imaging of breast: Secondary | ICD-10-CM

## 2017-09-09 DIAGNOSIS — R928 Other abnormal and inconclusive findings on diagnostic imaging of breast: Secondary | ICD-10-CM

## 2017-09-23 DIAGNOSIS — Z23 Encounter for immunization: Secondary | ICD-10-CM | POA: Diagnosis not present

## 2017-09-27 DIAGNOSIS — Z794 Long term (current) use of insulin: Secondary | ICD-10-CM | POA: Diagnosis not present

## 2017-09-27 DIAGNOSIS — E1122 Type 2 diabetes mellitus with diabetic chronic kidney disease: Secondary | ICD-10-CM | POA: Diagnosis not present

## 2017-09-27 DIAGNOSIS — N183 Chronic kidney disease, stage 3 (moderate): Secondary | ICD-10-CM | POA: Diagnosis not present

## 2017-10-04 DIAGNOSIS — E1122 Type 2 diabetes mellitus with diabetic chronic kidney disease: Secondary | ICD-10-CM | POA: Diagnosis not present

## 2017-10-04 DIAGNOSIS — I10 Essential (primary) hypertension: Secondary | ICD-10-CM | POA: Diagnosis not present

## 2017-10-04 DIAGNOSIS — Z794 Long term (current) use of insulin: Secondary | ICD-10-CM | POA: Diagnosis not present

## 2017-10-04 DIAGNOSIS — N183 Chronic kidney disease, stage 3 (moderate): Secondary | ICD-10-CM | POA: Diagnosis not present

## 2017-12-01 DIAGNOSIS — E119 Type 2 diabetes mellitus without complications: Secondary | ICD-10-CM | POA: Diagnosis not present

## 2018-01-31 DIAGNOSIS — J209 Acute bronchitis, unspecified: Secondary | ICD-10-CM | POA: Diagnosis not present

## 2018-01-31 DIAGNOSIS — R0602 Shortness of breath: Secondary | ICD-10-CM | POA: Diagnosis not present

## 2018-01-31 DIAGNOSIS — R062 Wheezing: Secondary | ICD-10-CM | POA: Diagnosis not present

## 2018-01-31 DIAGNOSIS — J219 Acute bronchiolitis, unspecified: Secondary | ICD-10-CM | POA: Diagnosis not present

## 2018-02-09 DIAGNOSIS — M064 Inflammatory polyarthropathy: Secondary | ICD-10-CM | POA: Diagnosis not present

## 2018-02-09 DIAGNOSIS — E11621 Type 2 diabetes mellitus with foot ulcer: Secondary | ICD-10-CM | POA: Diagnosis not present

## 2018-02-09 DIAGNOSIS — D649 Anemia, unspecified: Secondary | ICD-10-CM | POA: Diagnosis not present

## 2018-02-09 DIAGNOSIS — E1122 Type 2 diabetes mellitus with diabetic chronic kidney disease: Secondary | ICD-10-CM | POA: Diagnosis not present

## 2018-02-09 DIAGNOSIS — L97521 Non-pressure chronic ulcer of other part of left foot limited to breakdown of skin: Secondary | ICD-10-CM | POA: Diagnosis not present

## 2018-02-09 DIAGNOSIS — I480 Paroxysmal atrial fibrillation: Secondary | ICD-10-CM | POA: Diagnosis not present

## 2018-02-09 DIAGNOSIS — E7849 Other hyperlipidemia: Secondary | ICD-10-CM | POA: Diagnosis not present

## 2018-02-09 DIAGNOSIS — N183 Chronic kidney disease, stage 3 (moderate): Secondary | ICD-10-CM | POA: Diagnosis not present

## 2018-02-09 DIAGNOSIS — R001 Bradycardia, unspecified: Secondary | ICD-10-CM | POA: Diagnosis not present

## 2018-02-09 DIAGNOSIS — I10 Essential (primary) hypertension: Secondary | ICD-10-CM | POA: Diagnosis not present

## 2018-02-09 DIAGNOSIS — M48061 Spinal stenosis, lumbar region without neurogenic claudication: Secondary | ICD-10-CM | POA: Diagnosis not present

## 2018-02-16 DIAGNOSIS — D649 Anemia, unspecified: Secondary | ICD-10-CM | POA: Diagnosis not present

## 2018-02-16 DIAGNOSIS — I10 Essential (primary) hypertension: Secondary | ICD-10-CM | POA: Diagnosis not present

## 2018-02-16 DIAGNOSIS — M5416 Radiculopathy, lumbar region: Secondary | ICD-10-CM | POA: Diagnosis not present

## 2018-02-16 DIAGNOSIS — M150 Primary generalized (osteo)arthritis: Secondary | ICD-10-CM | POA: Diagnosis not present

## 2018-02-16 DIAGNOSIS — E1122 Type 2 diabetes mellitus with diabetic chronic kidney disease: Secondary | ICD-10-CM | POA: Diagnosis not present

## 2018-02-16 DIAGNOSIS — N183 Chronic kidney disease, stage 3 (moderate): Secondary | ICD-10-CM | POA: Diagnosis not present

## 2018-02-16 DIAGNOSIS — E7849 Other hyperlipidemia: Secondary | ICD-10-CM | POA: Diagnosis not present

## 2018-02-16 DIAGNOSIS — R001 Bradycardia, unspecified: Secondary | ICD-10-CM | POA: Diagnosis not present

## 2018-02-16 DIAGNOSIS — I480 Paroxysmal atrial fibrillation: Secondary | ICD-10-CM | POA: Diagnosis not present

## 2018-02-16 DIAGNOSIS — M064 Inflammatory polyarthropathy: Secondary | ICD-10-CM | POA: Diagnosis not present

## 2018-05-16 DIAGNOSIS — N183 Chronic kidney disease, stage 3 (moderate): Secondary | ICD-10-CM | POA: Diagnosis not present

## 2018-05-16 DIAGNOSIS — E1122 Type 2 diabetes mellitus with diabetic chronic kidney disease: Secondary | ICD-10-CM | POA: Diagnosis not present

## 2018-05-17 DIAGNOSIS — M1711 Unilateral primary osteoarthritis, right knee: Secondary | ICD-10-CM | POA: Diagnosis not present

## 2018-05-17 DIAGNOSIS — E669 Obesity, unspecified: Secondary | ICD-10-CM | POA: Diagnosis not present

## 2018-05-17 DIAGNOSIS — N183 Chronic kidney disease, stage 3 (moderate): Secondary | ICD-10-CM | POA: Diagnosis not present

## 2018-05-17 DIAGNOSIS — E1122 Type 2 diabetes mellitus with diabetic chronic kidney disease: Secondary | ICD-10-CM | POA: Diagnosis not present

## 2018-05-20 DIAGNOSIS — Z794 Long term (current) use of insulin: Secondary | ICD-10-CM | POA: Diagnosis not present

## 2018-05-20 DIAGNOSIS — E1122 Type 2 diabetes mellitus with diabetic chronic kidney disease: Secondary | ICD-10-CM | POA: Diagnosis not present

## 2018-05-20 DIAGNOSIS — N183 Chronic kidney disease, stage 3 (moderate): Secondary | ICD-10-CM | POA: Diagnosis not present

## 2018-06-07 DIAGNOSIS — E1122 Type 2 diabetes mellitus with diabetic chronic kidney disease: Secondary | ICD-10-CM | POA: Diagnosis not present

## 2018-06-07 DIAGNOSIS — M7542 Impingement syndrome of left shoulder: Secondary | ICD-10-CM | POA: Diagnosis not present

## 2018-06-07 DIAGNOSIS — E669 Obesity, unspecified: Secondary | ICD-10-CM | POA: Diagnosis not present

## 2018-06-07 DIAGNOSIS — N183 Chronic kidney disease, stage 3 (moderate): Secondary | ICD-10-CM | POA: Diagnosis not present

## 2018-09-07 DIAGNOSIS — Z794 Long term (current) use of insulin: Secondary | ICD-10-CM | POA: Diagnosis not present

## 2018-09-07 DIAGNOSIS — E1122 Type 2 diabetes mellitus with diabetic chronic kidney disease: Secondary | ICD-10-CM | POA: Diagnosis not present

## 2018-09-07 DIAGNOSIS — N183 Chronic kidney disease, stage 3 (moderate): Secondary | ICD-10-CM | POA: Diagnosis not present

## 2018-09-14 DIAGNOSIS — E11649 Type 2 diabetes mellitus with hypoglycemia without coma: Secondary | ICD-10-CM | POA: Diagnosis not present

## 2018-09-14 DIAGNOSIS — E1122 Type 2 diabetes mellitus with diabetic chronic kidney disease: Secondary | ICD-10-CM | POA: Diagnosis not present

## 2018-09-14 DIAGNOSIS — I10 Essential (primary) hypertension: Secondary | ICD-10-CM | POA: Diagnosis not present

## 2018-09-14 DIAGNOSIS — Z794 Long term (current) use of insulin: Secondary | ICD-10-CM | POA: Diagnosis not present

## 2018-09-14 DIAGNOSIS — N183 Chronic kidney disease, stage 3 (moderate): Secondary | ICD-10-CM | POA: Diagnosis not present

## 2018-10-05 DIAGNOSIS — E1122 Type 2 diabetes mellitus with diabetic chronic kidney disease: Secondary | ICD-10-CM | POA: Diagnosis not present

## 2018-10-05 DIAGNOSIS — M25562 Pain in left knee: Secondary | ICD-10-CM | POA: Diagnosis not present

## 2018-10-05 DIAGNOSIS — E669 Obesity, unspecified: Secondary | ICD-10-CM | POA: Diagnosis not present

## 2018-10-05 DIAGNOSIS — N183 Chronic kidney disease, stage 3 (moderate): Secondary | ICD-10-CM | POA: Diagnosis not present

## 2018-10-12 DIAGNOSIS — M5136 Other intervertebral disc degeneration, lumbar region: Secondary | ICD-10-CM | POA: Diagnosis not present

## 2018-10-12 DIAGNOSIS — M48062 Spinal stenosis, lumbar region with neurogenic claudication: Secondary | ICD-10-CM | POA: Diagnosis not present

## 2018-10-12 DIAGNOSIS — M5416 Radiculopathy, lumbar region: Secondary | ICD-10-CM | POA: Diagnosis not present

## 2018-10-18 DIAGNOSIS — N3945 Continuous leakage: Secondary | ICD-10-CM | POA: Diagnosis not present

## 2018-11-01 ENCOUNTER — Other Ambulatory Visit
Admission: RE | Admit: 2018-11-01 | Discharge: 2018-11-01 | Disposition: A | Discharge status: Home / Self Care | Payer: PPO | Source: Ambulatory Visit | Attending: Internal Medicine | Admitting: Internal Medicine

## 2018-11-01 DIAGNOSIS — I10 Essential (primary) hypertension: Secondary | ICD-10-CM | POA: Diagnosis not present

## 2018-11-01 DIAGNOSIS — R0609 Other forms of dyspnea: Secondary | ICD-10-CM | POA: Diagnosis not present

## 2018-11-01 DIAGNOSIS — D649 Anemia, unspecified: Secondary | ICD-10-CM | POA: Diagnosis not present

## 2018-11-01 DIAGNOSIS — E7849 Other hyperlipidemia: Secondary | ICD-10-CM | POA: Diagnosis not present

## 2018-11-01 DIAGNOSIS — I480 Paroxysmal atrial fibrillation: Secondary | ICD-10-CM | POA: Insufficient documentation

## 2018-11-01 DIAGNOSIS — M5416 Radiculopathy, lumbar region: Secondary | ICD-10-CM | POA: Diagnosis not present

## 2018-11-01 DIAGNOSIS — N183 Chronic kidney disease, stage 3 (moderate): Secondary | ICD-10-CM | POA: Diagnosis not present

## 2018-11-01 DIAGNOSIS — Z0000 Encounter for general adult medical examination without abnormal findings: Secondary | ICD-10-CM | POA: Diagnosis not present

## 2018-11-01 DIAGNOSIS — E1122 Type 2 diabetes mellitus with diabetic chronic kidney disease: Secondary | ICD-10-CM | POA: Diagnosis not present

## 2018-11-01 LAB — BRAIN NATRIURETIC PEPTIDE: B Natriuretic Peptide: 138 pg/mL — ABNORMAL HIGH (ref 0.0–100.0)

## 2018-11-02 ENCOUNTER — Other Ambulatory Visit: Payer: Self-pay | Admitting: Internal Medicine

## 2018-11-02 DIAGNOSIS — N184 Chronic kidney disease, stage 4 (severe): Secondary | ICD-10-CM

## 2018-11-08 ENCOUNTER — Ambulatory Visit
Admission: RE | Admit: 2018-11-08 | Discharge: 2018-11-08 | Disposition: A | Discharge status: Home / Self Care | Payer: PPO | Source: Ambulatory Visit | Attending: Internal Medicine | Admitting: Internal Medicine

## 2018-11-08 DIAGNOSIS — N184 Chronic kidney disease, stage 4 (severe): Secondary | ICD-10-CM | POA: Diagnosis not present

## 2018-11-08 DIAGNOSIS — G319 Degenerative disease of nervous system, unspecified: Secondary | ICD-10-CM | POA: Insufficient documentation

## 2018-11-14 DIAGNOSIS — I480 Paroxysmal atrial fibrillation: Secondary | ICD-10-CM | POA: Diagnosis not present

## 2018-11-16 DIAGNOSIS — D649 Anemia, unspecified: Secondary | ICD-10-CM | POA: Diagnosis not present

## 2018-11-16 DIAGNOSIS — Z794 Long term (current) use of insulin: Secondary | ICD-10-CM | POA: Diagnosis not present

## 2018-11-16 DIAGNOSIS — E1122 Type 2 diabetes mellitus with diabetic chronic kidney disease: Secondary | ICD-10-CM | POA: Diagnosis not present

## 2018-11-16 DIAGNOSIS — N184 Chronic kidney disease, stage 4 (severe): Secondary | ICD-10-CM | POA: Diagnosis not present

## 2018-11-16 DIAGNOSIS — E7849 Other hyperlipidemia: Secondary | ICD-10-CM | POA: Diagnosis not present

## 2018-11-16 DIAGNOSIS — I10 Essential (primary) hypertension: Secondary | ICD-10-CM | POA: Diagnosis not present

## 2018-11-16 DIAGNOSIS — I480 Paroxysmal atrial fibrillation: Secondary | ICD-10-CM | POA: Diagnosis not present

## 2018-11-16 DIAGNOSIS — I5021 Acute systolic (congestive) heart failure: Secondary | ICD-10-CM | POA: Diagnosis not present

## 2018-11-28 DIAGNOSIS — R079 Chest pain, unspecified: Secondary | ICD-10-CM | POA: Diagnosis not present

## 2018-11-29 DIAGNOSIS — Z794 Long term (current) use of insulin: Secondary | ICD-10-CM | POA: Diagnosis not present

## 2018-11-29 DIAGNOSIS — E669 Obesity, unspecified: Secondary | ICD-10-CM | POA: Diagnosis not present

## 2018-11-29 DIAGNOSIS — M1712 Unilateral primary osteoarthritis, left knee: Secondary | ICD-10-CM | POA: Diagnosis not present

## 2018-11-29 DIAGNOSIS — E1122 Type 2 diabetes mellitus with diabetic chronic kidney disease: Secondary | ICD-10-CM | POA: Diagnosis not present

## 2018-11-29 DIAGNOSIS — N184 Chronic kidney disease, stage 4 (severe): Secondary | ICD-10-CM | POA: Diagnosis not present

## 2018-12-07 ENCOUNTER — Other Ambulatory Visit: Payer: Self-pay | Admitting: Unknown Physician Specialty

## 2018-12-07 DIAGNOSIS — M25562 Pain in left knee: Secondary | ICD-10-CM

## 2018-12-14 ENCOUNTER — Ambulatory Visit: Payer: PPO

## 2018-12-16 ENCOUNTER — Ambulatory Visit
Admission: RE | Admit: 2018-12-16 | Discharge: 2018-12-16 | Disposition: A | Discharge status: Home / Self Care | Payer: PPO | Source: Ambulatory Visit | Attending: Unknown Physician Specialty | Admitting: Unknown Physician Specialty

## 2018-12-16 DIAGNOSIS — S83242A Other tear of medial meniscus, current injury, left knee, initial encounter: Secondary | ICD-10-CM | POA: Diagnosis not present

## 2018-12-16 DIAGNOSIS — M6752 Plica syndrome, left knee: Secondary | ICD-10-CM | POA: Diagnosis not present

## 2018-12-16 DIAGNOSIS — R600 Localized edema: Secondary | ICD-10-CM | POA: Diagnosis not present

## 2018-12-16 DIAGNOSIS — S83282A Other tear of lateral meniscus, current injury, left knee, initial encounter: Secondary | ICD-10-CM | POA: Diagnosis not present

## 2018-12-16 DIAGNOSIS — M1712 Unilateral primary osteoarthritis, left knee: Secondary | ICD-10-CM | POA: Diagnosis not present

## 2018-12-16 DIAGNOSIS — M25462 Effusion, left knee: Secondary | ICD-10-CM | POA: Insufficient documentation

## 2018-12-16 DIAGNOSIS — M65862 Other synovitis and tenosynovitis, left lower leg: Secondary | ICD-10-CM | POA: Diagnosis not present

## 2018-12-16 DIAGNOSIS — X58XXXA Exposure to other specified factors, initial encounter: Secondary | ICD-10-CM | POA: Insufficient documentation

## 2018-12-16 DIAGNOSIS — M25562 Pain in left knee: Secondary | ICD-10-CM | POA: Diagnosis present

## 2019-01-18 DIAGNOSIS — M25562 Pain in left knee: Secondary | ICD-10-CM | POA: Diagnosis not present

## 2019-01-18 DIAGNOSIS — M1712 Unilateral primary osteoarthritis, left knee: Secondary | ICD-10-CM | POA: Diagnosis not present

## 2019-01-18 DIAGNOSIS — G8929 Other chronic pain: Secondary | ICD-10-CM | POA: Diagnosis not present

## 2019-01-21 IMAGING — MR MR KNEE*L* W/O CM
6 series · 40 of 40 positions shown · non-contrast
Comparison: None.

CLINICAL DATA: Twisting knee injury 3 weeks ago with anterior pain.

EXAM:
MRI OF THE LEFT KNEE WITHOUT CONTRAST
TECHNIQUE: Multiplanar, multisequence MR imaging of the knee was performed. No
intravenous contrast was administered.

[Series 8: T2 fat-sat · axial · left · 4.0mm · 0.50mm/px · z∈[-87,+37]mm · 7 of 26 slices shown (1 of 3)]
[im 1/26]
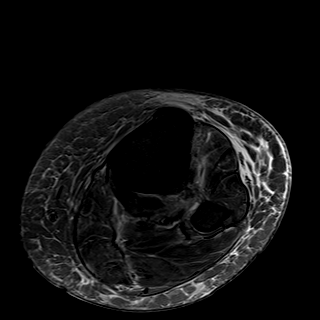
[im 5/26]
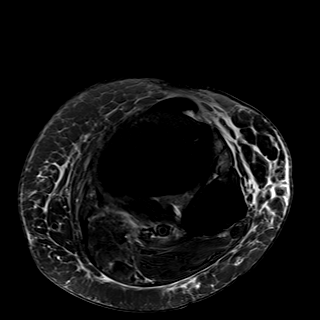
[im 9/26]
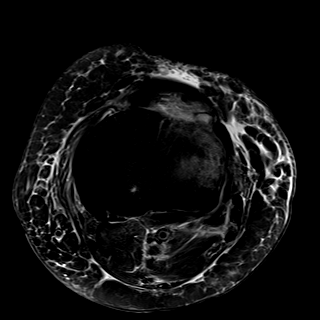
[im 13/26]
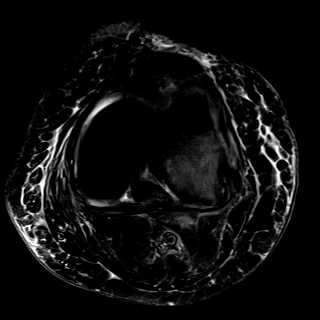
[im 17/26]
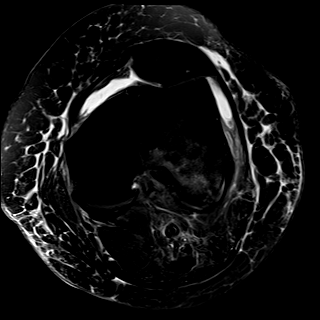
[im 21/26]
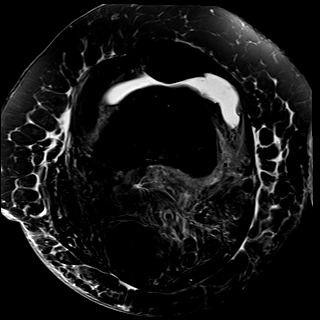
[im 26/26]
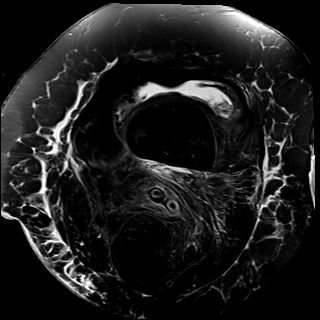

[Series 9: T1 · coronal · left · 4.0mm · 0.59mm/px · 7 of 24 slices shown]
[im 1/24]
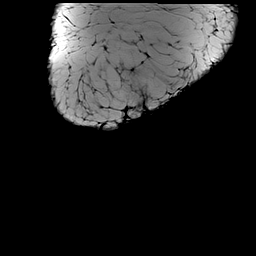
[im 4/24]
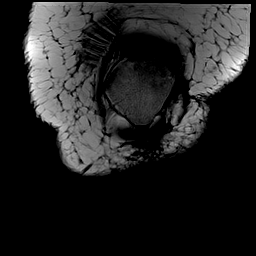
[im 8/24]
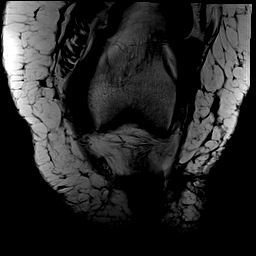
[im 12/24]
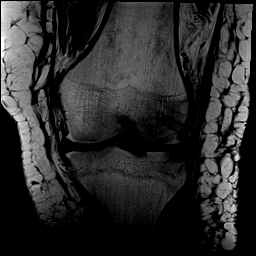
[im 16/24]
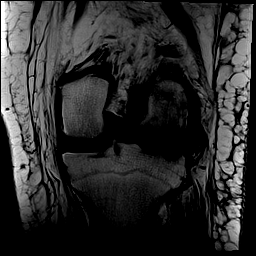
[im 20/24]
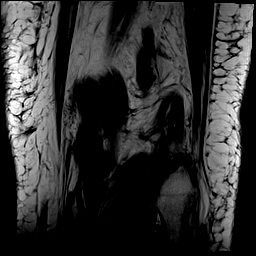
[im 24/24]
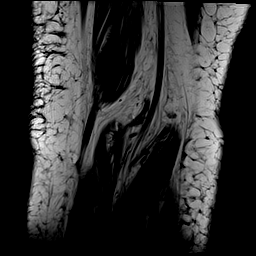

[Series 10: T2 fat-sat · coronal · left · 4.0mm · 0.59mm/px · 7 of 24 slices shown (2 of 3)]
[im 1/24]
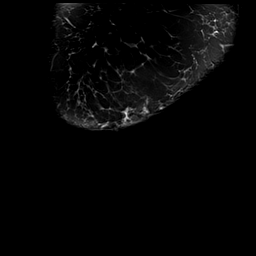
[im 4/24]
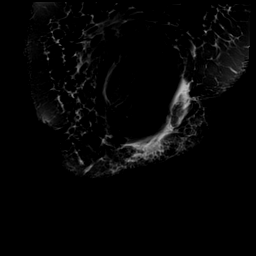
[im 8/24]
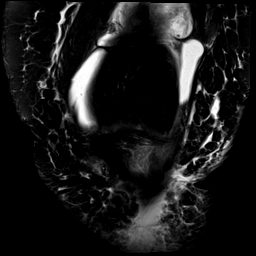
[im 12/24]
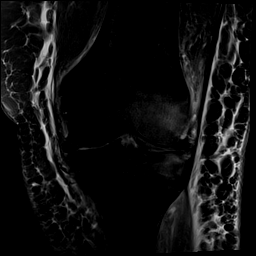
[im 16/24]
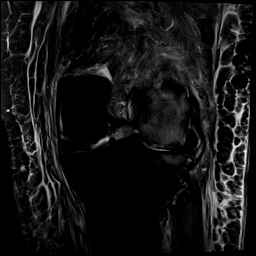
[im 20/24]
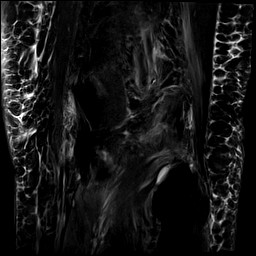
[im 24/24]
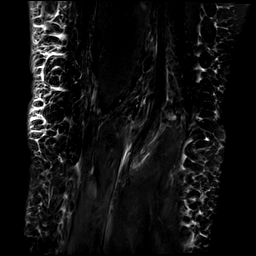

[Series 11: PD fat-sat · coronal · left · 4.0mm · 0.59mm/px · 7 of 24 slices shown (1 of 2)]
[im 1/24]
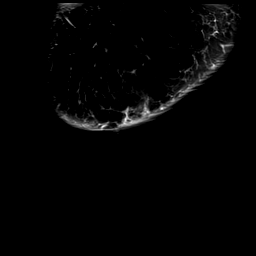
[im 4/24]
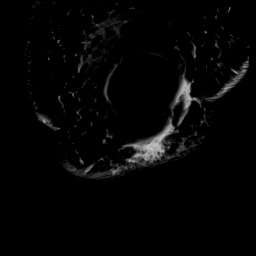
[im 8/24]
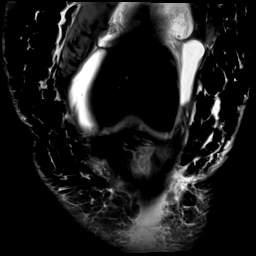
[im 12/24]
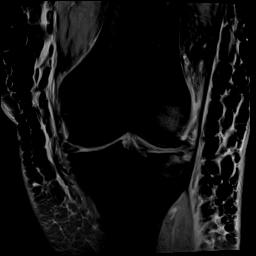
[im 16/24]
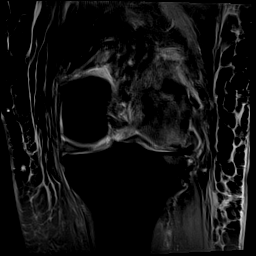
[im 20/24]
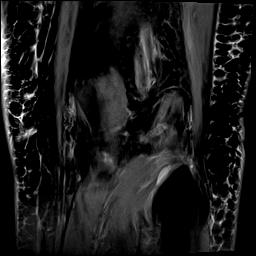
[im 24/24]
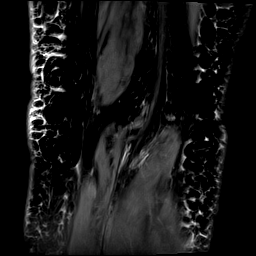

[Series 12: PD fat-sat · sagittal · left · 4.0mm · 0.59mm/px · 6 of 21 slices shown (2 of 2)]
[im 1/21]
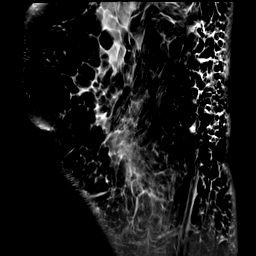
[im 5/21]
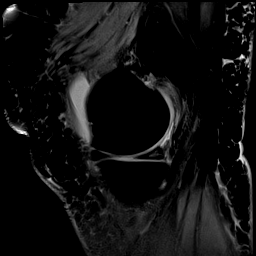
[im 9/21]
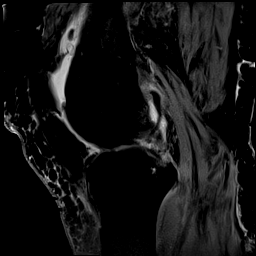
[im 13/21]
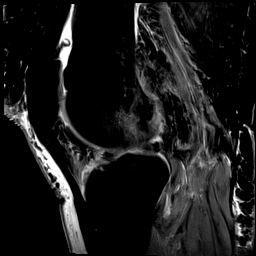
[im 17/21]
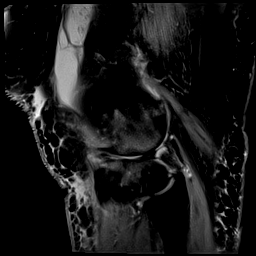
[im 21/21]
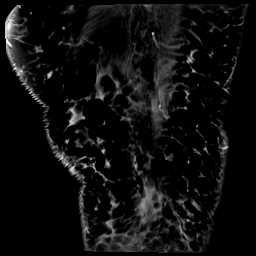

[Series 13: T2 fat-sat · sagittal · left · 4.0mm · 0.59mm/px · 6 of 22 slices shown (3 of 3)]
[im 1/22]
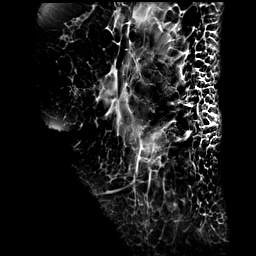
[im 5/22]
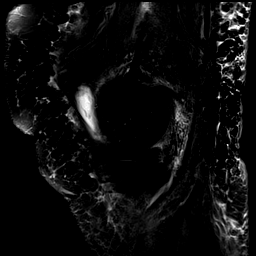
[im 9/22]
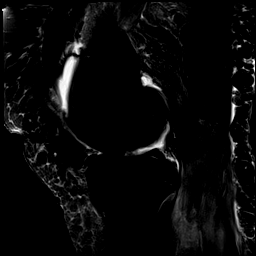
[im 13/22]
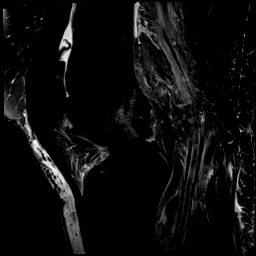
[im 17/22]
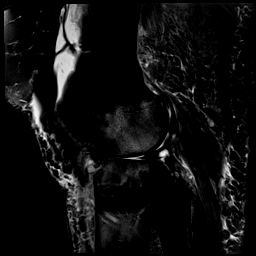
[im 22/22]
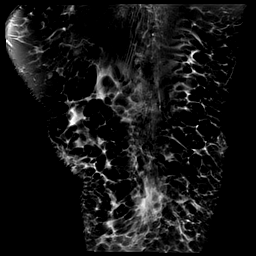

[40 of 40 positions shown; findings below may reference images not displayed]

FINDINGS: MENISCI

Medial meniscus: Considerable grade 1 signal throughout the medial
meniscus, without surface extension observed.

Lateral meniscus: Diminutive size and diffuse abnormal signal in the
anterior horn lateral meniscus compatible with degenerative tearing.
This extends back into the midbody especially along the free edge
and to the periphery. Flattened appearance of the anterior horn
lateral meniscus with some diminution in size. Lateral extrusion of
the meniscus.

LIGAMENTS

Cruciates:  Unremarkable

Collaterals:  Unremarkable

CARTILAGE

Patellofemoral: Full-thickness chondral defects along the posterior
patellar ridge with mild underlying subcortical marrow edema. Mild
chondral thinning and irregularity along the medial femoral
trochlear groove. Marginal spurring.

Medial:  Moderate degenerative chondral thinning.

Lateral: Non-fragmented osteochondral lesions or mild subcortical
stress fracture posteriorly along the lateral femoral condyle for
example on image [DATE], with surrounding marrow edema. Is also
marrow edema in the lateral tibial plateau. Severe degenerative
chondral thinning.

Joint:  Moderate knee effusion with synovitis.  Superior plica

Popliteal Fossa:  Infiltrative edema.

Extensor Mechanism:  Unremarkable

Bones: No significant extra-articular osseous abnormalities
identified.

Other: Diffuse subcutaneous edema with some confluence in the
prepatellar region.
IMPRESSION: 1. Extensive degenerative tearing of the anterior horn midbody
lateral meniscus. There is meniscal extrusion cause by the severe
chondral thinning in the lateral compartment.
2. Mild subcortical stress fracture or non-fragmented osteochondral
lesion posteriorly along the lateral femoral condyle with extensive
surrounding marrow edema. There is also adjacent marrow edema in the
lateral tibial plateau.
3. Full-thickness chondral defects with small foci of subcortical
marrow edema along the posterior patellar ridge.
4. Moderate degenerative chondral thinning in the medial
compartment.
5. Moderate knee effusion with synovitis and a superior plica.
6. Subcutaneous edema.

## 2019-01-23 DIAGNOSIS — M25569 Pain in unspecified knee: Secondary | ICD-10-CM | POA: Diagnosis not present

## 2019-01-23 DIAGNOSIS — I480 Paroxysmal atrial fibrillation: Secondary | ICD-10-CM | POA: Diagnosis not present

## 2019-01-23 DIAGNOSIS — D649 Anemia, unspecified: Secondary | ICD-10-CM | POA: Diagnosis not present

## 2019-01-23 DIAGNOSIS — E1122 Type 2 diabetes mellitus with diabetic chronic kidney disease: Secondary | ICD-10-CM | POA: Diagnosis not present

## 2019-01-23 DIAGNOSIS — E7849 Other hyperlipidemia: Secondary | ICD-10-CM | POA: Diagnosis not present

## 2019-01-23 DIAGNOSIS — N183 Chronic kidney disease, stage 3 (moderate): Secondary | ICD-10-CM | POA: Diagnosis not present

## 2019-01-31 DIAGNOSIS — I480 Paroxysmal atrial fibrillation: Secondary | ICD-10-CM | POA: Diagnosis not present

## 2019-01-31 DIAGNOSIS — M48061 Spinal stenosis, lumbar region without neurogenic claudication: Secondary | ICD-10-CM | POA: Diagnosis not present

## 2019-01-31 DIAGNOSIS — M25562 Pain in left knee: Secondary | ICD-10-CM | POA: Diagnosis not present

## 2019-01-31 DIAGNOSIS — Z794 Long term (current) use of insulin: Secondary | ICD-10-CM | POA: Diagnosis not present

## 2019-01-31 DIAGNOSIS — D649 Anemia, unspecified: Secondary | ICD-10-CM | POA: Diagnosis not present

## 2019-01-31 DIAGNOSIS — E669 Obesity, unspecified: Secondary | ICD-10-CM | POA: Diagnosis not present

## 2019-01-31 DIAGNOSIS — E7849 Other hyperlipidemia: Secondary | ICD-10-CM | POA: Diagnosis not present

## 2019-01-31 DIAGNOSIS — I10 Essential (primary) hypertension: Secondary | ICD-10-CM | POA: Diagnosis not present

## 2019-01-31 DIAGNOSIS — I5022 Chronic systolic (congestive) heart failure: Secondary | ICD-10-CM | POA: Diagnosis not present

## 2019-01-31 DIAGNOSIS — N184 Chronic kidney disease, stage 4 (severe): Secondary | ICD-10-CM | POA: Diagnosis not present

## 2019-01-31 DIAGNOSIS — E1122 Type 2 diabetes mellitus with diabetic chronic kidney disease: Secondary | ICD-10-CM | POA: Diagnosis not present

## 2019-02-06 DIAGNOSIS — N184 Chronic kidney disease, stage 4 (severe): Secondary | ICD-10-CM | POA: Diagnosis not present

## 2019-02-08 ENCOUNTER — Encounter: Payer: Self-pay | Admitting: Student in an Organized Health Care Education/Training Program

## 2019-02-08 ENCOUNTER — Ambulatory Visit
Payer: PPO | Attending: Student in an Organized Health Care Education/Training Program | Admitting: Student in an Organized Health Care Education/Training Program

## 2019-02-08 ENCOUNTER — Other Ambulatory Visit: Payer: Self-pay

## 2019-02-08 VITALS — BP 155/76 | HR 69 | Temp 97.7°F | Resp 18 | Ht 63.0 in | Wt 201.0 lb

## 2019-02-08 DIAGNOSIS — M23301 Other meniscus derangements, unspecified lateral meniscus, left knee: Secondary | ICD-10-CM | POA: Insufficient documentation

## 2019-02-08 DIAGNOSIS — G894 Chronic pain syndrome: Secondary | ICD-10-CM | POA: Diagnosis not present

## 2019-02-08 DIAGNOSIS — G8929 Other chronic pain: Secondary | ICD-10-CM

## 2019-02-08 DIAGNOSIS — M1712 Unilateral primary osteoarthritis, left knee: Secondary | ICD-10-CM | POA: Diagnosis not present

## 2019-02-08 DIAGNOSIS — M25562 Pain in left knee: Secondary | ICD-10-CM | POA: Insufficient documentation

## 2019-02-08 NOTE — Patient Instructions (Addendum)
1.Cardiac clearance to stop Xarelto 3 days prior to scheduled procedure  2. Tentative plan for LEFT KNEE GNB without sedation.    ____________________________________________________________________________________________  Preparing for your procedure (without sedation)  Instructions: . Oral Intake: Do not eat or drink anything for at least 3 hours prior to your procedure. . Transportation: Unless otherwise stated by your physician, you may drive yourself after the procedure. . Blood Pressure Medicine: Take your blood pressure medicine with a sip of water the morning of the procedure. . Blood thinners: Notify our staff if you are taking any blood thinners. Depending on which one you take, there will be specific instructions on how and when to stop it. . Diabetics on insulin: Notify the staff so that you can be scheduled 1st case in the morning. If your diabetes requires high dose insulin, take only  of your normal insulin dose the morning of the procedure and notify the staff that you have done so. . Preventing infections: Shower with an antibacterial soap the morning of your procedure.  . Build-up your immune system: Take 1000 mg of Vitamin C with every meal (3 times a day) the day prior to your procedure. Marland Kitchen Antibiotics: Inform the staff if you have a condition or reason that requires you to take antibiotics before dental procedures. . Pregnancy: If you are pregnant, call and cancel the procedure. . Sickness: If you have a cold, fever, or any active infections, call and cancel the procedure. . Arrival: You must be in the facility at least 30 minutes prior to your scheduled procedure. . Children: Do not bring any children with you. . Dress appropriately: Bring dark clothing that you would not mind if they get stained. . Valuables: Do not bring any jewelry or valuables.  Procedure appointments are reserved for interventional treatments only. Marland Kitchen No Prescription Refills. . No medication  changes will be discussed during procedure appointments. . No disability issues will be discussed.  Reasons to call and reschedule or cancel your procedure: (Following these recommendations will minimize the risk of a serious complication.) . Surgeries: Avoid having procedures within 2 weeks of any surgery. (Avoid for 2 weeks before or after any surgery). . Flu Shots: Avoid having procedures within 2 weeks of a flu shots or . (Avoid for 2 weeks before or after immunizations). . Barium: Avoid having a procedure within 7-10 days after having had a radiological study involving the use of radiological contrast. (Myelograms, Barium swallow or enema study). . Heart attacks: Avoid any elective procedures or surgeries for the initial 6 months after a "Myocardial Infarction" (Heart Attack). . Blood thinners: It is imperative that you stop these medications before procedures. Let us know if you if you take any blood thinner.  . Infection: Avoid procedures during or within two weeks of an infection (including chest colds or gastrointestinal problems). Symptoms associated with infections include: Localized redness, fever, chills, night sweats or profuse sweating, burning sensation when voiding, cough, congestion, stuffiness, runny nose, sore throat, diarrhea, nausea, vomiting, cold or Flu symptoms, recent or current infections. It is specially important if the infection is over the area that we intend to treat. Marland Kitchen Heart and lung problems: Symptoms that may suggest an active cardiopulmonary problem include: cough, chest pain, breathing difficulties or shortness of breath, dizziness, ankle swelling, uncontrolled high or unusually low blood pressure, and/or palpitations. If you are experiencing any of these symptoms, cancel your procedure and contact your primary care physician for an evaluation.  Remember:  Regular Business hours are:  Monday to Thursday 8:00 AM to 4:00 PM  Provider's Schedule: Milinda Pointer,  MD:  Procedure days: Tuesday and Thursday 7:30 AM to 4:00 PM  Gillis Santa, MD:  Procedure days: Monday and Wednesday 7:30 AM to 4:00 PM ____________________________________________________________________________________________

## 2019-02-08 NOTE — Progress Notes (Signed)
Safety precautions to be maintained throughout the outpatient stay will include: orient to surroundings, keep bed in low position, maintain call bell within reach at all times, provide assistance with transfer out of bed and ambulation.  

## 2019-02-08 NOTE — Progress Notes (Signed)
Patient's Name: Tiffany [name]  MRN: [id number]  Referring Provider: Leanor Kail, MD  DOB: 04-26-1933  PCP: Adin Hector, MD  DOS: 02/08/2019  Note by: Gillis Santa, MD  Service setting: Ambulatory outpatient  Specialty: Interventional Pain Management  Location: [facility] (AMB) Pain Management Facility  Visit type: Initial Patient Evaluation  Patient type: New Patient   Primary Reason(s) for Visit: Encounter for initial evaluation of one or more chronic problems (new to examiner) potentially causing chronic pain, and posing a threat to normal musculoskeletal function. (Level of risk: High) CC: New Patient (Initial Visit)  HPI  Tiffany [name] is a 85 y.o. year old, female patient, who comes today to see [place] for the first time for an initial evaluation of her chronic pain. She has Chronic pain of left knee; Primary osteoarthritis of left knee; Derangement of lateral meniscus, left; and Chronic pain syndrome on their problem list. Today she comes in for evaluation of her New Patient (Initial Visit)  Pain Assessment: Location: Left Knee Radiating: Radiates to back of knee and down to mid leg  Onset: More than a month ago Duration: Chronic pain Quality: Sharp, Stabbing, Throbbing Severity: 2/10 (subjective, self-reported pain score)  Note: Reported level is compatible with observation.                         When using our objective Pain Scale, levels between 6 and 10/10 are said to belong in an emergency room, as it progressively worsens from a 6/10, described as severely limiting, requiring emergency care not usually available at an outpatient pain management facility. At a 6/10 level, communication becomes difficult and requires great effort. Assistance to reach the emergency department may be required. Facial flushing and profuse sweating along with potentially dangerous increases in heart rate and blood pressure will be evident. Effect on ADL: "I have to sit in the chair all day"  Timing:  Intermittent Modifying factors: Sitting and tylenol  BP: (!) 155/76  HR: 69  Onset and Duration: Sudden and Present longer than 3 months Cause of pain: Trauma- Twisted knee Severity: Getting worse, NAS-11 at its worse: 10/10, NAS-11 at its best: 4/10, NAS-11 now: 10/10 and NAS-11 on the average: 8/10 Timing: Not influenced by the time of the day, During activity or exercise and After activity or exercise Aggravating Factors: Motion, Prolonged standing, Twisting, Walking, Walking uphill and Walking downhill Alleviating Factors: Cold packs, Medications, Sitting and Sleeping Associated Problems: Inability to control bladder (urine) Quality of Pain: Agonizing, Intermittent, Lancinating, Sharp, Shooting, Stabbing, Tender, Throbbing and Uncomfortable Previous Examinations or Tests: MRI scan, X-rays and Orthopedic evaluation Previous Treatments: The patient denies previous treatment   The patient comes into the clinics today for the first time for a chronic pain management evaluation.   Patient is 85 year old female with a history of left knee osteoarthritis.  MRI evidence of extensive degenerative tearing of anterior horn of mid body of lateral meniscus along with subcortical stress fractures along the lateral femoral condyle with extensive surrounding marrow edema.  Patient also has full-thickness chondral defects along the posterior patellar ridge.  Patient has tried intra-articular knee injections with Dr. Jefm Bryant as well as drainage of effusions.  She is referred here for consideration of left knee genicular nerve block and possible cooled radiofrequency ablation.   Meds   Current Outpatient Medications:  .  flecainide (TAMBOCOR) 100 MG tablet, Take 100 mg by mouth 2 (two) times daily., Disp: , Rfl:  .  glimepiride (AMARYL) 2 MG tablet, Take 2 mg by mouth daily with breakfast., Disp: , Rfl:  .  HYDROcodone-acetaminophen (NORCO/VICODIN) 5-325 MG tablet, Take 0.5 tablets by mouth daily as  needed for pain., Disp: , Rfl:  .  insulin aspart (NOVOLOG) 100 UNIT/ML injection, Inject 10 Units into the skin every evening., Disp: , Rfl:  .  Insulin Glargine (TOUJEO SOLOSTAR) 300 UNIT/ML SOPN, Inject 20 Units into the skin every evening., Disp: , Rfl:  .  Melatonin 5 MG TABS, Take 5 mg by mouth at bedtime., Disp: , Rfl:  .  rivaroxaban (XARELTO) 20 MG TABS tablet, Take 20 mg by mouth daily with breakfast., Disp: , Rfl:  .  valsartan-hydrochlorothiazide (DIOVAN-HCT) 320-12.5 MG tablet, Take 1 tablet by mouth daily., Disp: , Rfl:  .  DIGITEK 125 MCG tablet, Take 125 mcg by mouth daily., Disp: , Rfl:  .  docusate sodium (COLACE) 100 MG capsule, Take 100 mg by mouth 2 (two) times daily., Disp: , Rfl:  .  ibuprofen (ADVIL,MOTRIN) 200 MG tablet, Take 200 mg by mouth 2 (two) times daily., Disp: , Rfl:  .  metoprolol succinate (TOPROL-XL) 50 MG 24 hr tablet, Take 50 mg by mouth daily. Take with or immediately following a meal., Disp: , Rfl:   Imaging Review   Results for orders placed during the hospital encounter of 12/16/18  MR KNEE LEFT WO CONTRAST   Narrative CLINICAL DATA:  Twisting knee injury 3 weeks ago with anterior pain.  EXAM: MRI OF THE LEFT KNEE WITHOUT CONTRAST  TECHNIQUE: Multiplanar, multisequence MR imaging of the knee was performed. No intravenous contrast was administered.  COMPARISON:  None.  FINDINGS: MENISCI  Medial meniscus: Considerable grade 1 signal throughout the medial meniscus, without surface extension observed.  Lateral meniscus: Diminutive size and diffuse abnormal signal in the anterior horn lateral meniscus compatible with degenerative tearing. This extends back into the midbody especially along the free edge and to the periphery. Flattened appearance of the anterior horn lateral meniscus with some diminution in size. Lateral extrusion of the meniscus.  LIGAMENTS  Cruciates:  Unremarkable  Collaterals:   Unremarkable  CARTILAGE  Patellofemoral: Full-thickness chondral defects along the posterior patellar ridge with mild underlying subcortical marrow edema. Mild chondral thinning and irregularity along the medial femoral trochlear groove. Marginal spurring.  Medial:  Moderate degenerative chondral thinning.  Lateral: Non-fragmented osteochondral lesions or mild subcortical stress fracture posteriorly along the lateral femoral condyle for example on image 10/10, with surrounding marrow edema. Is also marrow edema in the lateral tibial plateau. Severe degenerative chondral thinning.  Joint:  Moderate knee effusion with synovitis.  Superior plica  Popliteal Fossa:  Infiltrative edema.  Extensor Mechanism:  Unremarkable  Bones: No significant extra-articular osseous abnormalities identified.  Other: Diffuse subcutaneous edema with some confluence in the prepatellar region.  IMPRESSION: 1. Extensive degenerative tearing of the anterior horn midbody lateral meniscus. There is meniscal extrusion cause by the severe chondral thinning in the lateral compartment. 2. Mild subcortical stress fracture or non-fragmented osteochondral lesion posteriorly along the lateral femoral condyle with extensive surrounding marrow edema. There is also adjacent marrow edema in the lateral tibial plateau. 3. Full-thickness chondral defects with small foci of subcortical marrow edema along the posterior patellar ridge. 4. Moderate degenerative chondral thinning in the medial compartment. 5. Moderate knee effusion with synovitis and a superior plica. 6. Subcutaneous edema.   Electronically Signed   By: Van Clines M.D.   On: 12/17/2018 10:15     Complexity Note:  Imaging results reviewed. Results shared with Ms. Eulas Post, using Layman's terms.                         ROS  Cardiovascular: Heart trouble, abnormal heart rhythm, high blood pressure, blood thinner: Xarelto  Pulmonary or  Respiratory: Shortness of breath Neurological: No reported neurological signs or symptoms such as seizures, abnormal skin sensations, urinary and/or fecal incontinence, being born with an abnormal open spine and/or a tethered spinal cord Review of Past Neurological Studies: No results found for this or any previous visit. Psychological-Psychiatric: No reported psychological or psychiatric signs or symptoms such as difficulty sleeping, anxiety, depression, delusions or hallucinations (schizophrenial), mood swings (bipolar disorders) or suicidal ideations or attempts Gastrointestinal: Irregular, infrequent bowel movements (Constipation) Genitourinary: Kidney disease, Difficulty producing urine (Renal failure) and Recurrent Urinary Tract infections Hematological: No reported hematological signs or symptoms such as prolonged bleeding, low or poor functioning platelets, bruising or bleeding easily, hereditary bleeding problems, low energy levels due to low hemoglobin or being anemic Endocrine: High blood sugar requiring insulin (IDDM) Rheumatologic: No reported rheumatological signs and symptoms such as fatigue, joint pain, tenderness, swelling, redness, heat, stiffness, decreased range of motion, with or without associated rash Musculoskeletal: Negative for myasthenia gravis, muscular dystrophy, multiple sclerosis or malignant hyperthermia Work History: Retired  Allergies  Ms. Kemmerer is allergic to codeine.  Laboratory Chemistry  Inflammation Markers (CRP: Acute Phase) (ESR: Chronic Phase) No results found for: CRP, ESRSEDRATE, LATICACIDVEN                       Rheumatology Markers No results found for: RF, ANA, LABURIC, URICUR, LYMEIGGIGMAB, LYMEABIGMQN, HLAB27                      Renal Function Markers Lab Results  Component Value Date   BUN 19 (H) 04/20/2012   CREATININE 0.86 04/20/2012   GFRAA >60 04/20/2012   GFRNONAA >60 04/20/2012                             Hepatic Function  Markers Lab Results  Component Value Date   AST 9 (L) 04/19/2012   ALT 20 04/19/2012   ALBUMIN 3.8 04/19/2012   ALKPHOS 67 04/19/2012                        Electrolytes Lab Results  Component Value Date   NA 140 04/20/2012   K 3.5 04/20/2012   CL 106 04/20/2012   CALCIUM 8.7 04/20/2012   MG 1.6 (L) 04/19/2012                        Neuropathy Markers No results found for: VITAMINB12, FOLATE, HGBA1C, HIV                      CNS Tests No results found for: COLORCSF, APPEARCSF, RBCCOUNTCSF, WBCCSF, POLYSCSF, LYMPHSCSF, EOSCSF, PROTEINCSF, GLUCCSF, JCVIRUS, CSFOLI, IGGCSF                      Bone Pathology Markers No results found for: [place], [id number], [id number], GB2010OF1, 25OHVITD1, 25OHVITD2, 25OHVITD3, TESTOFREE, TESTOSTERONE                       Coagulation Parameters Lab Results  Component Value Date   INR 0.9  04/19/2012   LABPROT 12.8 04/19/2012   APTT 24.9 04/19/2012   PLT 180 04/21/2012                        Cardiovascular Markers Lab Results  Component Value Date   BNP 138.0 (H) 11/01/2018   CKTOTAL 16 (L) 04/20/2012   CKMB < 0.5 (L) 04/20/2012   TROPONINI < 0.02 04/20/2012   HGB 11.6 (L) 04/21/2012   HCT 34.2 (L) 04/21/2012                         CA Markers No results found for: CEA, CA125, LABCA2                      Endocrine Markers Lab Results  Component Value Date   TSH 0.886 04/19/2012                        Note: Lab results reviewed.  [place]  Drug: Ms. Wales  reports current drug use. Alcohol:  reports current alcohol use. Tobacco:  reports that she has never smoked. She has never used smokeless tobacco. Medical:  has a past medical history of A-fib ([place]), Anemia, Arthritis, Cataracts, both eyes, Chickenpox, Chronic kidney disease, Diabetes mellitus without complication ([place]), Dysrhythmia, Hyperlipidemia, Hypertension, Right leg weakness, Spinal stenosis, and UTI (urinary tract infection). Family: family history is not on file.  She was adopted.  Past Surgical History:  Procedure Laterality Date  . BACK SURGERY    . CARDIOVERSION    . CATARACT EXTRACTION, BILATERAL    . CERVICAL LAMINECTOMY    . ELECTROPHYSIOLOGIC STUDY N/A 01/12/2017   Procedure: Cardioversion;  Surgeon: Teodoro Spray, MD;  Location: [facility] ORS;  Service: Cardiovascular;  Laterality: N/A;  . SHOULDER SURGERY     Active Ambulatory Problems    Diagnosis Date Noted  . Chronic pain of left knee 02/08/2019  . Primary osteoarthritis of left knee 02/08/2019  . Derangement of lateral meniscus, left 02/08/2019  . Chronic pain syndrome 02/08/2019   Resolved Ambulatory Problems    Diagnosis Date Noted  . No Resolved Ambulatory Problems   Past Medical History:  Diagnosis Date  . A-fib ([place])   . Anemia   . Arthritis   . Cataracts, both eyes   . Chickenpox   . Chronic kidney disease   . Diabetes mellitus without complication ([place])   . Dysrhythmia   . Hyperlipidemia   . Hypertension   . Right leg weakness   . Spinal stenosis   . UTI (urinary tract infection)    Constitutional Exam  General appearance: Well nourished, well developed, and well hydrated. In no apparent acute distress Vitals:   02/08/19 1323  BP: (!) 155/76  Pulse: 69  Resp: 18  Temp: 97.7 F (36.5 C)  SpO2: 99%  Weight: 201 lb (91.2 kg)  Height: '5\' 3"'  (1.6 m)   BMI Assessment: Estimated body mass index is 35.61 kg/m as calculated from the following:   Height as of this encounter: '5\' 3"'  (1.6 m).   Weight as of this encounter: 201 lb (91.2 kg).  BMI interpretation table: BMI level Category Range association with higher incidence of chronic pain  <18 kg/m2 Underweight   18.5-24.9 kg/m2 Ideal body weight   25-29.9 kg/m2 Overweight Increased incidence by 20%  30-34.9 kg/m2 Obese (Class I) Increased incidence by 68%  35-39.9 kg/m2 Severe obesity (Class II) Increased  incidence by 136%  >40 kg/m2 Extreme obesity (Class III) Increased incidence by 254%   Patient's  current BMI Ideal Body weight  Body mass index is 35.61 kg/m. Ideal body weight: 52.4 kg (115 lb 8.3 oz) Adjusted ideal body weight: 67.9 kg (149 lb 11.4 oz)   BMI Readings from Last 4 Encounters:  02/08/19 35.61 kg/m  01/12/17 31.76 kg/m   Wt Readings from Last 4 Encounters:  02/08/19 201 lb (91.2 kg)  01/12/17 185 lb (83.9 kg)  Psych/Mental status: Alert, oriented x 3 (person, place, & time)       Eyes: PERLA Respiratory: No evidence of acute respiratory distress  Cervical Spine Area Exam  Skin & Axial Inspection: No masses, redness, edema, swelling, or associated skin lesions Alignment: Symmetrical Functional ROM: Unrestricted ROM      Stability: No instability detected Muscle Tone/Strength: Functionally intact. No obvious neuro-muscular anomalies detected. Sensory (Neurological): Unimpaired Palpation: No palpable anomalies              Upper Extremity (UE) Exam    Side: Right upper extremity  Side: Left upper extremity  Skin & Extremity Inspection: Skin color, temperature, and hair growth are WNL. No peripheral edema or cyanosis. No masses, redness, swelling, asymmetry, or associated skin lesions. No contractures.  Skin & Extremity Inspection: Skin color, temperature, and hair growth are WNL. No peripheral edema or cyanosis. No masses, redness, swelling, asymmetry, or associated skin lesions. No contractures.  Functional ROM: Unrestricted ROM          Functional ROM: Unrestricted ROM          Muscle Tone/Strength: Functionally intact. No obvious neuro-muscular anomalies detected.  Muscle Tone/Strength: Functionally intact. No obvious neuro-muscular anomalies detected.  Sensory (Neurological): Unimpaired          Sensory (Neurological): Unimpaired          Palpation: No palpable anomalies              Palpation: No palpable anomalies              Provocative Test(s):  Phalen's test: deferred Tinel's test: deferred Apley's scratch test (touch opposite shoulder):  Action 1  (Across chest): deferred Action 2 (Overhead): deferred Action 3 (LB reach): deferred   Provocative Test(s):  Phalen's test: deferred Tinel's test: deferred Apley's scratch test (touch opposite shoulder):  Action 1 (Across chest): deferred Action 2 (Overhead): deferred Action 3 (LB reach): deferred    Thoracic Spine Area Exam  Skin & Axial Inspection: No masses, redness, or swelling Alignment: Symmetrical Functional ROM: Unrestricted ROM Stability: No instability detected Muscle Tone/Strength: Functionally intact. No obvious neuro-muscular anomalies detected. Sensory (Neurological): Unimpaired Muscle strength & Tone: No palpable anomalies  Lumbar Spine Area Exam  Skin & Axial Inspection: No masses, redness, or swelling Alignment: Symmetrical Functional ROM: Decreased ROM       Stability: No instability detected Muscle Tone/Strength: Functionally intact. No obvious neuro-muscular anomalies detected. Sensory (Neurological): Dermatomal pain pattern and musculoskeletal Palpation: No palpable anomalies       Provocative Tests: Hyperextension/rotation test: (+) due to pain. Lumbar quadrant test (Kemp's test): deferred today       Lateral bending test: deferred today       Patrick's Maneuver: deferred today                   FABER* test: deferred today                   S-I anterior distraction/compression test: deferred  today         S-I lateral compression test: deferred today         S-I Thigh-thrust test: deferred today         S-I Gaenslen's test: deferred today         *(Flexion, ABduction and External Rotation)  Gait & Posture Assessment  Ambulation: Patient brought in today on a stretcher Gait: Limited. Using assistive device to ambulate Posture: Difficulty standing up straight, due to pain   Lower Extremity Exam    Side: Right lower extremity  Side: Left lower extremity  Stability: No instability observed          Stability: No instability observed          Skin &  Extremity Inspection: Skin color, temperature, and hair growth are WNL. No peripheral edema or cyanosis. No masses, redness, swelling, asymmetry, or associated skin lesions. No contractures.  Skin & Extremity Inspection: Skin color, temperature, and hair growth are WNL. No peripheral edema or cyanosis. No masses, redness, swelling, asymmetry, or associated skin lesions. No contractures.  Functional ROM: Decreased ROM for hip and knee joints          Functional ROM: Decreased ROM for hip and knee joints          Muscle Tone/Strength: Mild-to-moderate deconditioning  Muscle Tone/Strength: Mild-to-moderate deconditioning  Sensory (Neurological): Arthropathic arthralgia        Sensory (Neurological): Arthropathic arthralgia        DTR: Patellar: 1+: trace Achilles: deferred today Plantar: deferred today  DTR: Patellar: 0: absent Achilles: deferred today Plantar: deferred today  Palpation: No palpable anomalies  Palpation: No palpable anomalies   Assessment  Primary Diagnosis & Pertinent Problem List: The primary encounter diagnosis was Chronic pain of left knee. Diagnoses of Primary osteoarthritis of left knee, Derangement of lateral meniscus, left, and Chronic pain syndrome were also pertinent to this visit.  Visit Diagnosis (New problems to examiner): 1. Chronic pain of left knee   2. Primary osteoarthritis of left knee   3. Derangement of lateral meniscus, left   4. Chronic pain syndrome    Patient is 85 year old female with a history of left knee osteoarthritis.  MRI evidence of extensive degenerative tearing of anterior horn of mid body of lateral meniscus along with subcortical stress fractures along the lateral femoral condyle with extensive surrounding marrow edema.  Patient also has full-thickness chondral defects along the posterior patellar ridge.  Patient has tried intra-articular knee injections with Dr. Jefm Bryant as well as drainage of effusions.  She is referred here for  consideration of left knee genicular nerve block and possible cooled radiofrequency ablation.  Risks and benefits of this procedure were discussed in great detail with the patient and patient would like to proceed.  Of note the patient will need cardiac clearance to stop her Xarelto 3 days prior to scheduled procedure.  Plan: -Left knee genicular nerve block without sedation.  Cardiac clearance to stop Xarelto 3 days prior to scheduled procedure.  Ordered Lab-work, Procedure(s), Referral(s), & Consult(s): Orders Placed This Encounter  Procedures  . GENICULAR NERVE BLOCK   Interventional management options: Ms. Finkel was informed that there is no guarantee that she would be a candidate for interventional therapies. The decision will be based on the results of diagnostic studies, as well as Ms. Paget's risk profile.  Procedure(s) under consideration:  Left knee genicular nerve block with possible cooled radiofrequency lesioning Lumbar epidural steroid injections Lumbar facet medial branch nerve blocks.  Provider-requested follow-up: Return in about 1 week (around 02/15/2019) for Procedure, Blood Thinner Protocol.  Future Appointments  Date Time Provider [place]  02/15/2019 11:00 AM Gillis Santa, MD [facility] None    Primary Care Physician: Adin Hector, MD Location: [facility] Note by: Gillis Santa, M.D, Date: 02/08/2019; Time: 3:03 PM  Patient Instructions  1.Cardiac clearance to stop Xarelto 3 days prior to scheduled procedure  2. Tentative plan for LEFT KNEE GNB without sedation.    ____________________________________________________________________________________________  Preparing for your procedure (without sedation)  Instructions: . Oral Intake: Do not eat or drink anything for at least 3 hours prior to your procedure. . Transportation: Unless otherwise stated by your physician, you may drive yourself after the  procedure. . Blood Pressure Medicine: Take your blood pressure medicine with a sip of water the morning of the procedure. . Blood thinners: Notify our staff if you are taking any blood thinners. Depending on which one you take, there will be specific instructions on how and when to stop it. . Diabetics on insulin: Notify the staff so that you can be scheduled 1st case in the morning. If your diabetes requires high dose insulin, take only  of your normal insulin dose the morning of the procedure and notify the staff that you have done so. . Preventing infections: Shower with an antibacterial soap the morning of your procedure.  . Build-up your immune system: Take 1000 mg of Vitamin C with every meal (3 times a day) the day prior to your procedure. Marland Kitchen Antibiotics: Inform the staff if you have a condition or reason that requires you to take antibiotics before dental procedures. . Pregnancy: If you are pregnant, call and cancel the procedure. . Sickness: If you have a cold, fever, or any active infections, call and cancel the procedure. . Arrival: You must be in the facility at least 30 minutes prior to your scheduled procedure. . Children: Do not bring any children with you. . Dress appropriately: Bring dark clothing that you would not mind if they get stained. . Valuables: Do not bring any jewelry or valuables.  Procedure appointments are reserved for interventional treatments only. Mar[place] Kitchen No Prescription Refills. . No medication changes will be discussed during procedure appointments. . No disability issues will be discussed.  Reasons to call and reschedule or cancel your procedure: (Following these recommendations will minimize the risk of a serious complication.) . Surgeries: Avoid having procedures within 2 weeks of any surgery. (Avoid for 2 weeks before or after any surgery). . Flu Shots: Avoid having procedures within 2 weeks of a flu shots or . (Avoid for 2 weeks before or after  immunizations). . Barium: Avoid having a procedure within 7-10 days after having had a radiological study involving the use of radiological contrast. (Myelograms, Barium swallow or enema study). . Heart attacks: Avoid any elective procedures or surgeries for the initial 6 months after a "Myocardial Infarction" (Heart Attack). . Blood thinners: It is imperative that you stop these medications before procedures. Let us know if you if you take any blood thinner.  . Infection: Avoid procedures during or within two weeks of an infection (including chest colds or gastrointestinal problems). Symptoms associated with infections include: Localized redness, fever, chills, night sweats or profuse sweating, burning sensation when voiding, cough, congestion, stuffiness, runny nose, sore throat, diarrhea, nausea, vomiting, cold or Flu symptoms, recent or current infections. It is specially important if the infection is over the area that we intend  to treat. Marland Kitchen Heart and lung problems: Symptoms that may suggest an active cardiopulmonary problem include: cough, chest pain, breathing difficulties or shortness of breath, dizziness, ankle swelling, uncontrolled high or unusually low blood pressure, and/or palpitations. If you are experiencing any of these symptoms, cancel your procedure and contact your primary care physician for an evaluation.  Remember:  Regular Business hours are:  Monday to Thursday 8:00 AM to 4:00 PM  Provider's Schedule: Milinda Pointer, MD:  Procedure days: Tuesday and Thursday 7:30 AM to 4:00 PM  Gillis Santa, MD:  Procedure days: Monday and Wednesday 7:30 AM to 4:00 PM ____________________________________________________________________________________________

## 2019-02-09 DIAGNOSIS — M150 Primary generalized (osteo)arthritis: Secondary | ICD-10-CM | POA: Diagnosis not present

## 2019-02-09 DIAGNOSIS — E1122 Type 2 diabetes mellitus with diabetic chronic kidney disease: Secondary | ICD-10-CM | POA: Diagnosis not present

## 2019-02-09 DIAGNOSIS — E790 Hyperuricemia without signs of inflammatory arthritis and tophaceous disease: Secondary | ICD-10-CM | POA: Diagnosis not present

## 2019-02-09 DIAGNOSIS — D649 Anemia, unspecified: Secondary | ICD-10-CM | POA: Diagnosis not present

## 2019-02-09 DIAGNOSIS — I10 Essential (primary) hypertension: Secondary | ICD-10-CM | POA: Diagnosis not present

## 2019-02-09 DIAGNOSIS — E7849 Other hyperlipidemia: Secondary | ICD-10-CM | POA: Diagnosis not present

## 2019-02-09 DIAGNOSIS — I480 Paroxysmal atrial fibrillation: Secondary | ICD-10-CM | POA: Diagnosis not present

## 2019-02-09 DIAGNOSIS — M5416 Radiculopathy, lumbar region: Secondary | ICD-10-CM | POA: Diagnosis not present

## 2019-02-09 DIAGNOSIS — I5022 Chronic systolic (congestive) heart failure: Secondary | ICD-10-CM | POA: Diagnosis not present

## 2019-02-09 DIAGNOSIS — M48061 Spinal stenosis, lumbar region without neurogenic claudication: Secondary | ICD-10-CM | POA: Diagnosis not present

## 2019-02-09 DIAGNOSIS — N184 Chronic kidney disease, stage 4 (severe): Secondary | ICD-10-CM | POA: Diagnosis not present

## 2019-02-15 ENCOUNTER — Ambulatory Visit: Payer: PPO | Admitting: Student in an Organized Health Care Education/Training Program

## 2019-02-20 ENCOUNTER — Other Ambulatory Visit: Payer: Self-pay

## 2019-02-20 ENCOUNTER — Encounter: Payer: Self-pay | Admitting: Student in an Organized Health Care Education/Training Program

## 2019-02-20 ENCOUNTER — Ambulatory Visit
Admission: RE | Admit: 2019-02-20 | Discharge: 2019-02-20 | Disposition: A | Discharge status: Home / Self Care | Payer: PPO | Source: Ambulatory Visit | Attending: Student in an Organized Health Care Education/Training Program | Admitting: Student in an Organized Health Care Education/Training Program

## 2019-02-20 ENCOUNTER — Ambulatory Visit (HOSPITAL_BASED_OUTPATIENT_CLINIC_OR_DEPARTMENT_OTHER): Payer: PPO | Admitting: Student in an Organized Health Care Education/Training Program

## 2019-02-20 VITALS — BP 173/97 | HR 78 | Temp 98.2°F | Resp 20 | Wt 201.0 lb

## 2019-02-20 DIAGNOSIS — M25562 Pain in left knee: Secondary | ICD-10-CM | POA: Diagnosis not present

## 2019-02-20 DIAGNOSIS — M1712 Unilateral primary osteoarthritis, left knee: Secondary | ICD-10-CM | POA: Insufficient documentation

## 2019-02-20 DIAGNOSIS — G8929 Other chronic pain: Secondary | ICD-10-CM

## 2019-02-20 MED ORDER — ROPIVACAINE HCL 2 MG/ML IJ SOLN
10.0000 mL | Freq: Once | INTRAMUSCULAR | Status: AC
  Administered 2019-02-20: 10 mL

## 2019-02-20 MED ORDER — LIDOCAINE HCL 2 % IJ SOLN
20.0000 mL | Freq: Once | INTRAMUSCULAR | Status: AC
  Administered 2019-02-20: 400 mg

## 2019-02-20 MED ORDER — DEXAMETHASONE SODIUM PHOSPHATE 10 MG/ML IJ SOLN
INTRAMUSCULAR | Status: AC
  Filled 2019-02-20: qty 1

## 2019-02-20 MED ORDER — DEXAMETHASONE SODIUM PHOSPHATE 10 MG/ML IJ SOLN
10.0000 mg | Freq: Once | INTRAMUSCULAR | Status: AC
  Administered 2019-02-20: 10 mg

## 2019-02-20 MED ORDER — LIDOCAINE HCL 2 % IJ SOLN
INTRAMUSCULAR | Status: AC
  Filled 2019-02-20: qty 20

## 2019-02-20 MED ORDER — ROPIVACAINE HCL 2 MG/ML IJ SOLN
INTRAMUSCULAR | Status: AC
  Filled 2019-02-20: qty 10

## 2019-02-20 NOTE — Progress Notes (Signed)
Patient's Name: Tiffany [name]  MRN: [id number]  Referring Provider: Adin Hector, MD  DOB: May 10, 1933  PCP: Adin Hector, MD  DOS: 02/20/2019  Note by: Gillis Santa, MD  Service setting: Ambulatory outpatient  Specialty: Interventional Pain Management  Patient type: Established  Location: [facility] (AMB) Pain Management Facility  Visit type: Interventional Procedure   Primary Reason for Visit: Interventional Pain Management Treatment. CC: Procedure and Knee Pain  Procedure:          Anesthesia, Analgesia, Anxiolysis:  Type: Genicular Nerves Block (Superior-lateral, Superior-medial, and Inferior-medial Genicular Nerves) #1  CPT: 40981      Primary Purpose: Diagnostic Region: Lateral, Anterior, and Medial aspects of the knee joint, above and below the knee joint proper. Level: Superior and inferior to the knee joint. Target Area: For Genicular Nerve block(s), the targets are: the superior-lateral genicular nerve, located in the lateral distal portion of the femoral shaft as it curves to form the lateral epicondyle, in the region of the distal femoral metaphysis; the superior-medial genicular nerve, located in the medial distal portion of the femoral shaft as it curves to form the medial epicondyle; and the inferior-medial genicular nerve, located in the medial, proximal portion of the tibial shaft, as it curves to form the medial epicondyle, in the region of the proximal tibial metaphysis. Approach: Anterior, percutaneous, ipsilateral approach. Laterality: Left knee   Local Anesthetic: Lidocaine 1-2%  Position: Modified Fowler's position with pillows under the targeted knee(s).   Indications: 1. Chronic pain of left knee   2. Primary osteoarthritis of left knee    Pain Score: Pre-procedure: 9 /10 Post-procedure: 0-No pain/10  Patient stopped Xarelto 3 days prior to scheduled procedure.  Pre-op Assessment:  Tiffany [name] is a 85 y.o. (year old), female patient, seen today for  interventional treatment. She  has a past surgical history that includes Cataract extraction, bilateral; Back surgery; Shoulder surgery; Cervical laminectomy; Cardioversion; and Cardiac catheterization (N/A, 01/12/2017). Tiffany [name] has a current medication list which includes the following prescription(s): acetaminophen, flecainide, glimepiride, hydrocodone-acetaminophen, insulin aspart, insulin glargine, melatonin, metoprolol succinate, digitek, docusate sodium, ibuprofen, rivaroxaban, and valsartan-hydrochlorothiazide. Her primarily concern today is the Procedure and Knee Pain  Initial Vital Signs:  Pulse/HCG Rate: 78ECG Heart Rate: 82 Temp: 98.2 F (36.8 C) Resp: 18 BP: (!) 148/118 SpO2: 98 %  BMI: Estimated body mass index is 35.61 kg/m as calculated from the following:   Height as of 02/08/19: 5\' 3"  (1.6 m).   Weight as of this encounter: 201 lb (91.2 kg).  Risk Assessment: Allergies: Reviewed. She is allergic to codeine.  Allergy Precautions: None required Coagulopathies: Reviewed. None identified.  Blood-thinner therapy: None at this time Active Infection(s): Reviewed. None identified. Tiffany [name] is afebrile  Site Confirmation: Tiffany [name] was asked to confirm the procedure and laterality before marking the site Procedure checklist: Completed Consent: Before the procedure and under the influence of no sedative(s), amnesic(s), or anxiolytics, the patient was informed of the treatment options, risks and possible complications. To fulfill our ethical and legal obligations, as recommended by the American Medical Association's Code of Ethics, I have informed the patient of my clinical impression; the nature and purpose of the treatment or procedure; the risks, benefits, and possible complications of the intervention; the alternatives, including doing nothing; the risk(s) and benefit(s) of the alternative treatment(s) or procedure(s); and the risk(s) and benefit(s) of doing nothing. The  patient was provided information about the general risks and possible complications associated with the  procedure. These may include, but are not limited to: failure to achieve desired goals, infection, bleeding, organ or nerve damage, allergic reactions, paralysis, and death. In addition, the patient was informed of those risks and complications associated to the procedure, such as failure to decrease pain; infection; bleeding; organ or nerve damage with subsequent damage to sensory, motor, and/or autonomic systems, resulting in permanent pain, numbness, and/or weakness of one or several areas of the body; allergic reactions; (i.e.: anaphylactic reaction); and/or death. Furthermore, the patient was informed of those risks and complications associated with the medications. These include, but are not limited to: allergic reactions (i.e.: anaphylactic or anaphylactoid reaction(s)); adrenal axis suppression; blood sugar elevation that in diabetics may result in ketoacidosis or comma; water retention that in patients with history of congestive heart failure may result in shortness of breath, pulmonary edema, and decompensation with resultant heart failure; weight gain; swelling or edema; medication-induced neural toxicity; particulate matter embolism and blood vessel occlusion with resultant organ, and/or nervous system infarction; and/or aseptic necrosis of one or more joints. Finally, the patient was informed that Medicine is not an exact science; therefore, there is also the possibility of unforeseen or unpredictable risks and/or possible complications that may result in a catastrophic outcome. The patient indicated having understood very clearly. We have given the patient no guarantees and we have made no promises. Enough time was given to the patient to ask questions, all of which were answered to the patient's satisfaction. Tiffany [name] has indicated that she wanted to continue with the procedure. Attestation:  I, the ordering provider, attest that I have discussed with the patient the benefits, risks, side-effects, alternatives, likelihood of achieving goals, and potential problems during recovery for the procedure that I have provided informed consent. Date  Time: 02/20/2019 11:34 AM  Pre-Procedure Preparation:  Monitoring: As per clinic protocol. Respiration, ETCO2, SpO2, BP, heart rate and rhythm monitor placed and checked for adequate function Safety Precautions: Patient was assessed for positional comfort and pressure points before starting the procedure. Time-out: I initiated and conducted the "Time-out" before starting the procedure, as per protocol. The patient was asked to participate by confirming the accuracy of the "Time Out" information. Verification of the correct person, site, and procedure were performed and confirmed by me, the nursing staff, and the patient. "Time-out" conducted as per Joint Commission's Universal Protocol (UP.01.01.01). Time: 1230  Description of Procedure:          Area Prepped: Entire knee area, from mid-thigh to mid-shin, lateral, anterior, and medial aspects. Prepping solution: ChloraPrep (2% chlorhexidine gluconate and 70% isopropyl alcohol) Safety Precautions: Aspiration looking for blood return was conducted prior to all injections. At no point did we inject any substances, as a needle was being advanced. No attempts were made at seeking any paresthesias. Safe injection practices and needle disposal techniques used. Medications properly checked for expiration dates. SDV (single dose vial) medications used. Description of the Procedure: Protocol guidelines were followed. The patient was placed in position over the procedure table. The target area was identified and the area prepped in the usual manner. Skin & deeper tissues infiltrated with local anesthetic. Appropriate amount of time allowed to pass for local anesthetics to take effect. The procedure needles were  then advanced to the target area. Proper needle placement secured. Negative aspiration confirmed. Solution injected in intermittent fashion, asking for systemic symptoms every 0.5cc of injectate. The needles were then removed and the area cleansed, making sure to leave some of the prepping solution  back to take advantage of its long term bactericidal properties.  Vitals:   02/20/19 1152 02/20/19 1230 02/20/19 1235 02/20/19 1237  BP: (!) 158/90 (!) 178/97 (!) 173/107 (!) 173/97  Pulse: 78     Resp:  20 20 20   Temp:      SpO2:  100% 99% 98%  Weight:        Start Time: 1230 hrs. End Time: 1237 hrs. Materials:  Needle(s) Type: Spinal Needle Gauge: 25G Length: 3.5-in Medication(s): Please see orders for medications and dosing details. 6 cc solution consisting of 5 cc of 0.2% ropivacaine, 1 cc of Decadron 10 mg/cc.  2 cc injected at each level above for the left knee.  Imaging Guidance (Non-Spinal):          Type of Imaging Technique: Fluoroscopy Guidance (Non-Spinal) Indication(s): Assistance in needle guidance and placement for procedures requiring needle placement in or near specific anatomical locations not easily accessible without such assistance. Exposure Time: Please see nurses notes. Contrast: None used. Fluoroscopic Guidance: I was personally present during the use of fluoroscopy. "Tunnel Vision Technique" used to obtain the best possible view of the target area. Parallax error corrected before commencing the procedure. "Direction-depth-direction" technique used to introduce the needle under continuous pulsed fluoroscopy. Once target was reached, antero-posterior, oblique, and lateral fluoroscopic projection used confirm needle placement in all planes. Images permanently stored in EMR. Interpretation: No contrast injected. I personally interpreted the imaging intraoperatively. Adequate needle placement confirmed in multiple planes. Permanent images saved into the patient's  record.  Antibiotic Prophylaxis:   Anti-infectives (From admission, onward)   None     Indication(s): None identified  Post-operative Assessment:  Post-procedure Vital Signs:  Pulse/HCG Rate: 7885 Temp: 98.2 F (36.8 C) Resp: 20 BP: (!) 173/97 SpO2: 98 %  EBL: None  Complications: No immediate post-treatment complications observed by team, or reported by patient.  Note: The patient tolerated the entire procedure well. A repeat set of vitals were taken after the procedure and the patient was kept under observation following institutional policy, for this type of procedure. Post-procedural neurological assessment was performed, showing return to baseline, prior to discharge. The patient was provided with post-procedure discharge instructions, including a section on how to identify potential problems. Should any problems arise concerning this procedure, the patient was given instructions to immediately contact us, at any time, without hesitation. In any case, we plan to contact the patient by telephone for a follow-up status report regarding this interventional procedure.  Comments:  No additional relevant information.  Plan of Care   Imaging Orders     DG C-Arm 1-60 Min-No Report Procedure Orders    No procedure(s) ordered today   Patient instructed to restart Xarelto tomorrow so long as she is not having any new onset lower extremity weakness or excessive bleeding/bruising around her knee.  Medications ordered for procedure: Meds ordered this encounter  Medications  . ropivacaine (PF) 2 mg/mL (0.2%) (NAROPIN) injection 10 mL  . lidocaine (XYLOCAINE) 2 % (with pres) injection 400 mg  . dexamethasone (DECADRON) injection 10 mg   Medications administered: We administered ropivacaine (PF) 2 mg/mL (0.2%), lidocaine, and dexamethasone.  See the medical record for exact dosing, route, and time of administration.  Disposition: Discharge home  Discharge Date & Time: 02/20/2019;  1245 hrs.   Physician-requested Follow-up: Return in about 3 weeks (around 03/13/2019) for Post Procedure Evaluation.  Future Appointments  Date Time Provider [place]  03/15/2019  1:45 PM Gillis Santa, MD [facility] None  Primary Care Physician: Adin Hector, MD Location: [facility] Outpatient [facility] Management Facility Note by: Gillis Santa, MD Date: 02/20/2019; Time: 2:31 PM  Disclaimer:  Medicine is not an exact science. The only guarantee in medicine is that nothing is guaranteed. It is important to note that the decision to proceed with this intervention was based on the information collected from the patient. The Data and conclusions were drawn from the patient's questionnaire, the interview, and the physical examination. Because the information was provided in large part by the patient, it cannot be guaranteed that it has not been purposely or unconsciously manipulated. Every effort has been made to obtain as much relevant data as possible for this evaluation. It is important to note that the conclusions that lead to this procedure are derived in large part from the available data. Always take into account that the treatment will also be dependent on availability of resources and existing treatment guidelines, considered by other Pain Management Practitioners as being common knowledge and practice, at the time of the intervention. For Medico-Legal purposes, it is also important to point out that variation in procedural techniques and pharmacological choices are the acceptable norm. The indications, contraindications, technique, and results of the above procedure should only be interpreted and judged by a Board-Certified Interventional Pain Specialist with extensive familiarity and expertise in the same exact procedure and technique.

## 2019-02-20 NOTE — Patient Instructions (Addendum)
You may restart your Xarelto tomorrow. ____________________________________________________________________________________________  Post-procedure Information What to expect: Most procedures involve the use of a local anesthetic (numbing medicine), and a steroid (anti-inflammatory medicine).  The local anesthetics may cause temporary numbness and weakness of the legs or arms, depending on the location of the block. This numbness/weakness may last 4-6 hours, depending on the local anesthetic used. In rare instances, it can last up to 24 hours. While numb, you must be very careful not to injure the extremity.  After any procedure, you could expect the pain to get better within 15-20 minutes. This relief is temporary and may last 4-6 hours. Once the local anesthetics wears off, you could experience discomfort, possibly more than usual, for up to 10 (ten) days. In the case of radiofrequencies, it may last up to 6 weeks. Surgeries may take up to 8 weeks for the healing process. The discomfort is due to the irritation caused by needles going through skin and muscle. To minimize the discomfort, we recommend using ice the first day, and heat from then on. The ice should be applied for 15 minutes on, and 15 minutes off. Keep repeating this cycle until bedtime. Avoid applying the ice directly to the skin, to prevent frostbite. Heat should be used daily, until the pain improves (4-10 days). Be careful not to burn yourself.  Occasionally you may experience muscle spasms or cramps. These occur as a consequence of the irritation caused by the needle sticks to the muscle and the blood that will inevitably be lost into the surrounding muscle tissue. Blood tends to be very irritating to tissues, which tend to react by going into spasm. These spasms may start the same day of your procedure, but they may also take days to develop. This late onset type of spasm or cramp is usually caused by electrolyte imbalances triggered  by the steroids, at the level of the kidney. Cramps and spasms tend to respond well to muscle relaxants, multivitamins (some are triggered by the procedure, but may have their origins in vitamin deficiencies), and "Gatorade", or any sports drinks that can replenish any electrolyte imbalances. (If you are a diabetic, ask your pharmacist to get you a sugar-free brand.) Warm showers or baths may also be helpful. Stretching exercises are highly recommended.  General Instructions:  Be alert for signs of possible infection: redness, swelling, heat, red streaks, elevated temperature, and/or fever. These typically appear 4 to 6 days after the procedure. Immediately notify your doctor if you experience unusual bleeding, difficulty breathing, or loss of bowel or bladder control. If you experience increased pain, do not increase your pain medicine intake, unless instructed by your pain physician.  Post-Procedure Care:  Be careful in moving about. Muscle spasms in the area of the injection may occur. Applying ice or heat to the area is often helpful. The incidence of spinal headaches after epidural injections ranges between 1.4% and 6%. If you develop a headache that does not seem to respond to conservative therapy, please let your physician know. This can be treated with an epidural blood patch.   Post-procedure numbness or redness is to be expected, however it should average 4 to 6 hours. If numbness and weakness of your extremities begins to develop 4 to 6 hours after your procedure, and is felt to be progressing and worsening, immediately contact your physician.  Diet:  If you experience nausea, do not eat until this sensation goes away. If you had a "Stellate Ganglion Block" for upper extremity "Reflex  Sympathetic Dystrophy", do not eat or drink until your hoarseness goes away. In any case, always start with liquids first and if you tolerate them well, then slowly progress to more solid foods.  Activity:  For  the first 4 to 6 hours after the procedure, use caution in moving about as you may experience numbness and/or weakness. Use caution in cooking, using household electrical appliances, and climbing steps. If you need to reach your Doctor call our office: [phone] (During business hours) or [phone] (After business hours).  Business Hours: Monday-Thursday 8:00 am - 4:00 PM    Fridays: Closed     In case of an emergency: In case of emergency, call 911 or go to the nearest emergency room and have the physician there call us.  Interpretation of Procedure Every nerve block has two components: a diagnostic component, and a treatment component. Unrealistic expectations are the most common causes of "perceived failure".  In a perfect world, a single nerve block should be able to completely and permanently eliminate the pain. Sadly, the world is not perfect.  Most pain management nerve blocks are performed using local anesthetics and steroids. Steroids are responsible for any long-term benefit that you may experience. Their purpose is to decrease any chronic swelling that may exist in the area. Steroids begin to work immediately after being injected. However, most patients will not experience any benefits until 5 to 10 days after the injection, when the swelling has come down to the point where they can tell a difference. Steroids will only help if there is swelling to be treated. As such, they can assist with the diagnosis. If effective, they suggest an inflammatory component to the pain, and if ineffective, they rule out inflammation as the main cause or component of the problem. If the problem is one of mechanical compression, you will get no benefit from those steroids.   In the case of local anesthetics, they have a crucial role in the diagnosis of your condition. Most will begin to work within15 to 20 minutes after injection. The duration will depend on the type used (short- vs. Long-acting).  It is of outmost importance that patients keep tract of their pain, after the procedure. To assist with this matter, a "Post-procedure Pain Diary" is provided. Make sure to complete it and to bring it back to your follow-up appointment.  As long as the patient keeps accurate, detailed records of their symptoms after every procedure, and returns to have those interpreted, every procedure will provide [place] with invaluable information. Even a block that does not provide the patient with any relief, will always provide [place] with information about the mechanism and the origin of the pain. The only time a nerve block can be considered a waste of time is when patients do not keep track of the results, or do not keep their post-procedure appointment.  Reporting the results back to your physician The Pain Score  Pain is a subjective complaint. It cannot be seen, touched, or measured. We depend entirely on the patient's report of the pain in order to assess your condition and treatment. To evaluate the pain, we use a pain scale, where "0" means "No Pain", and a "10" is "the worst possible pain that you can even imagine" (i.e. something like been eaten alive by a shark or being torn apart by a lion).   Use the Pain Scale provided. You will frequently be asked to rate your pain. Please be accurate, remember that medical  decisions will be based on your responses. Please do not rate your pain above a 10. Doing so is actually interpreted as "symptom magnification" (exaggeration). To put this into perspective, when you tell us that your pain is at a 10 (ten), what you are saying is that there is nothing we can do to make this pain any worse. (Carefully think about that.) ____________________________________________________________________________________________   Pain Management Discharge Instructions  General Discharge Instructions :  If you need to reach your doctor call: Monday-Friday 8:00 am - 4:00 pm at  [phone] or toll free [phone].  After clinic hours [phone] to have operator reach doctor.  Bring all of your medication bottles to all your appointments in the pain clinic.  To cancel or reschedule your appointment with Pain Management please remember to call 24 hours in advance to avoid a fee.  Refer to the educational materials which you have been given on: General Risks, I had my Procedure. Discharge Instructions, Post Sedation.  Post Procedure Instructions:  Please notify your doctor immediately if you have any unusual bleeding, trouble breathing or pain that is not related to your normal pain.  Depending on the type of procedure that was done, some parts of your body may feel week and/or numb.  This usually clears up by tonight or the next day.  Walk with the use of an assistive device or accompanied by an adult for the 24 hours.  You may use ice on the affected area for the first 24 hours.  Put ice in a Ziploc bag and cover with a towel and place against area 15 minutes on 15 minutes off.  You may switch to heat after 24 hours.

## 2019-02-20 NOTE — Progress Notes (Signed)
Safety precautions to be maintained throughout the outpatient stay will include: orient to surroundings, keep bed in low position, maintain call bell within reach at all times, provide assistance with transfer out of bed and ambulation.  

## 2019-02-21 ENCOUNTER — Telehealth: Payer: Self-pay

## 2019-02-21 NOTE — Telephone Encounter (Signed)
Post procedure phone call. Patient states she is doing good.  

## 2019-02-23 DIAGNOSIS — E1129 Type 2 diabetes mellitus with other diabetic kidney complication: Secondary | ICD-10-CM | POA: Diagnosis not present

## 2019-02-23 DIAGNOSIS — N184 Chronic kidney disease, stage 4 (severe): Secondary | ICD-10-CM | POA: Diagnosis not present

## 2019-02-23 DIAGNOSIS — I129 Hypertensive chronic kidney disease with stage 1 through stage 4 chronic kidney disease, or unspecified chronic kidney disease: Secondary | ICD-10-CM | POA: Diagnosis not present

## 2019-02-23 DIAGNOSIS — R600 Localized edema: Secondary | ICD-10-CM | POA: Diagnosis not present

## 2019-03-03 DIAGNOSIS — M1712 Unilateral primary osteoarthritis, left knee: Secondary | ICD-10-CM | POA: Diagnosis not present

## 2019-03-07 ENCOUNTER — Telehealth: Payer: Self-pay

## 2019-03-07 NOTE — Telephone Encounter (Signed)
FYI

## 2019-03-07 NOTE — Telephone Encounter (Signed)
Her husband called in to cancel her follow up appt. He said that her knee is worse than it was before the genicular RF and she feels like she cant do it again and wants to have a knee replacement.  They will call back to schedule a follow up if she cant get cleared for surgery or if her knee starts feeling a lot better soon. They just wanted Dr. Holley Raring to know.

## 2019-03-09 DIAGNOSIS — I10 Essential (primary) hypertension: Secondary | ICD-10-CM | POA: Diagnosis not present

## 2019-03-09 DIAGNOSIS — Z794 Long term (current) use of insulin: Secondary | ICD-10-CM | POA: Diagnosis not present

## 2019-03-09 DIAGNOSIS — N184 Chronic kidney disease, stage 4 (severe): Secondary | ICD-10-CM | POA: Diagnosis not present

## 2019-03-09 DIAGNOSIS — E1122 Type 2 diabetes mellitus with diabetic chronic kidney disease: Secondary | ICD-10-CM | POA: Diagnosis not present

## 2019-03-09 DIAGNOSIS — I480 Paroxysmal atrial fibrillation: Secondary | ICD-10-CM | POA: Diagnosis not present

## 2019-03-09 DIAGNOSIS — I5022 Chronic systolic (congestive) heart failure: Secondary | ICD-10-CM | POA: Diagnosis not present

## 2019-03-15 ENCOUNTER — Ambulatory Visit: Payer: PPO | Admitting: Student in an Organized Health Care Education/Training Program

## 2019-03-16 DIAGNOSIS — R079 Chest pain, unspecified: Secondary | ICD-10-CM | POA: Diagnosis not present

## 2019-03-27 DIAGNOSIS — M1712 Unilateral primary osteoarthritis, left knee: Secondary | ICD-10-CM | POA: Diagnosis not present

## 2019-05-03 DIAGNOSIS — Z794 Long term (current) use of insulin: Secondary | ICD-10-CM | POA: Diagnosis not present

## 2019-05-03 DIAGNOSIS — E790 Hyperuricemia without signs of inflammatory arthritis and tophaceous disease: Secondary | ICD-10-CM | POA: Diagnosis not present

## 2019-05-03 DIAGNOSIS — E1122 Type 2 diabetes mellitus with diabetic chronic kidney disease: Secondary | ICD-10-CM | POA: Diagnosis not present

## 2019-05-03 DIAGNOSIS — E7849 Other hyperlipidemia: Secondary | ICD-10-CM | POA: Diagnosis not present

## 2019-05-03 DIAGNOSIS — D649 Anemia, unspecified: Secondary | ICD-10-CM | POA: Diagnosis not present

## 2019-05-03 DIAGNOSIS — N184 Chronic kidney disease, stage 4 (severe): Secondary | ICD-10-CM | POA: Diagnosis not present

## 2019-05-10 DIAGNOSIS — D649 Anemia, unspecified: Secondary | ICD-10-CM | POA: Diagnosis not present

## 2019-05-10 DIAGNOSIS — I480 Paroxysmal atrial fibrillation: Secondary | ICD-10-CM | POA: Diagnosis not present

## 2019-05-10 DIAGNOSIS — N184 Chronic kidney disease, stage 4 (severe): Secondary | ICD-10-CM | POA: Diagnosis not present

## 2019-05-10 DIAGNOSIS — I5022 Chronic systolic (congestive) heart failure: Secondary | ICD-10-CM | POA: Diagnosis not present

## 2019-05-10 DIAGNOSIS — E7849 Other hyperlipidemia: Secondary | ICD-10-CM | POA: Diagnosis not present

## 2019-05-10 DIAGNOSIS — E1122 Type 2 diabetes mellitus with diabetic chronic kidney disease: Secondary | ICD-10-CM | POA: Diagnosis not present

## 2019-05-10 DIAGNOSIS — I10 Essential (primary) hypertension: Secondary | ICD-10-CM | POA: Diagnosis not present

## 2019-05-10 DIAGNOSIS — M5416 Radiculopathy, lumbar region: Secondary | ICD-10-CM | POA: Diagnosis not present

## 2019-05-10 DIAGNOSIS — M48061 Spinal stenosis, lumbar region without neurogenic claudication: Secondary | ICD-10-CM | POA: Diagnosis not present

## 2019-05-10 DIAGNOSIS — Z794 Long term (current) use of insulin: Secondary | ICD-10-CM | POA: Diagnosis not present

## 2019-05-18 ENCOUNTER — Other Ambulatory Visit: Payer: Self-pay

## 2019-05-18 ENCOUNTER — Encounter
Admission: RE | Admit: 2019-05-18 | Discharge: 2019-05-18 | Disposition: A | Discharge status: Home / Self Care | Payer: PPO | Source: Ambulatory Visit | Attending: Surgery | Admitting: Surgery

## 2019-05-18 DIAGNOSIS — Z01812 Encounter for preprocedural laboratory examination: Secondary | ICD-10-CM | POA: Diagnosis not present

## 2019-05-18 DIAGNOSIS — Z1159 Encounter for screening for other viral diseases: Secondary | ICD-10-CM | POA: Diagnosis not present

## 2019-05-18 HISTORY — DX: Other specified postprocedural states: Z98.890

## 2019-05-18 HISTORY — DX: Other specified postprocedural states: R11.2

## 2019-05-18 HISTORY — DX: Dyspnea, unspecified: R06.00

## 2019-05-18 HISTORY — DX: Other complications of anesthesia, initial encounter: T88.59XA

## 2019-05-18 LAB — BASIC METABOLIC PANEL
Anion gap: 11 (ref 5–15)
BUN: 33 mg/dL — ABNORMAL HIGH (ref 8–23)
CO2: 21 mmol/L — ABNORMAL LOW (ref 22–32)
Calcium: 8.8 mg/dL — ABNORMAL LOW (ref 8.9–10.3)
Chloride: 107 mmol/L (ref 98–111)
Creatinine, Ser: 1.7 mg/dL — ABNORMAL HIGH (ref 0.44–1.00)
GFR calc Af Amer: 32 mL/min — ABNORMAL LOW (ref >60)
GFR calc non Af Amer: 27 mL/min — ABNORMAL LOW (ref >60)
Glucose, Bld: 144 mg/dL — ABNORMAL HIGH (ref 70–99)
Potassium: 4.3 mmol/L (ref 3.5–5.1)
Sodium: 139 mmol/L (ref 135–145)

## 2019-05-18 LAB — CBC
HCT: 42.7 % (ref 36.0–46.0)
Hemoglobin: 13.5 g/dL (ref 12.0–15.0)
MCH: 27.2 pg (ref 26.0–34.0)
MCHC: 31.6 g/dL (ref 30.0–36.0)
MCV: 85.9 fL (ref 80.0–100.0)
Platelets: 215 10*3/uL (ref 150–400)
RBC: 4.97 MIL/uL (ref 3.87–5.11)
RDW: 14.1 % (ref 11.5–15.5)
WBC: 9.3 10*3/uL (ref 4.0–10.5)
nRBC: 0 % (ref 0.0–0.2)

## 2019-05-18 LAB — URINALYSIS, ROUTINE W REFLEX MICROSCOPIC
Bilirubin Urine: NEGATIVE
Glucose, UA: NEGATIVE mg/dL
Hgb urine dipstick: NEGATIVE
Ketones, ur: NEGATIVE mg/dL
Leukocytes,Ua: NEGATIVE
Nitrite: NEGATIVE
Protein, ur: NEGATIVE mg/dL
Specific Gravity, Urine: 1.016 (ref 1.005–1.030)
pH: 5 (ref 5.0–8.0)

## 2019-05-18 LAB — TYPE AND SCREEN
ABO/RH(D): A POS
Antibody Screen: NEGATIVE

## 2019-05-18 NOTE — Patient Instructions (Signed)
Your procedure is scheduled on: 05-23-19 TUESDAY Report to Same Day Surgery 2nd floor medical mall [facility] Entrance-take elevator on left to 2nd floor.  Check in with surgery information desk.) To find out your arrival time please call [phone] between 1PM - 3PM on 05-19-19 FRIDAY  Remember: Instructions that are not followed completely may result in serious medical risk, up to and including death, or upon the discretion of your surgeon and anesthesiologist your surgery may need to be rescheduled.    _x___ 1. Do not eat food after midnight the night before your procedure. NO GUM OR CANDY AFTER MIDNIGHT. You may drink WATER up to 2 hours before you are scheduled to arrive at the hospital for your procedure.  Do not drink WATER  within 2 hours of your scheduled arrival to the hospital.  Type 1 and type 2 diabetics should only drink water.   ____Ensure clear carbohydrate drink on the way to the hospital for bariatric patients  ____Ensure clear carbohydrate drink 3 hours before surgery for Dr Dwyane Luo patients if physician instructed.     __x__ 2. No Alcohol for 24 hours before or after surgery.   __x__3. No Smoking or e-cigarettes for 24 prior to surgery.  Do not use any chewable tobacco products for at least 6 hour prior to surgery   ____  4. Bring all medications with you on the day of surgery if instructed.    __x__ 5. Notify your doctor if there is any change in your medical condition     (cold, fever, infections).    x___6. On the morning of surgery brush your teeth with toothpaste and water.  You may rinse your mouth with mouth wash if you wish.  Do not swallow any toothpaste or mouthwash.   Do not wear jewelry, make-up, hairpins, clips or nail polish.  Do not wear lotions, powders, or perfumes. You may wear deodorant.  Do not shave 48 hours prior to surgery. Men may shave face and neck.  Do not bring valuables to the hospital.    [facility] is not responsible for any  belongings or valuables.               Contacts, dentures or bridgework may not be worn into surgery.  Leave your suitcase in the car. After surgery it may be brought to your room.  For patients admitted to the hospital, discharge time is determined by your treatment team.  _  Patients discharged the day of surgery will not be allowed to drive home.  You will need someone to drive you home and stay with you the night of your procedure.    Please read over the following fact sheets that you were given:   [facility] Preparing for Surgery and or MRSA Information   _x___ TAKE THE FOLLOWING MEDICATION THE MORNING OF SURGERY WITH A SMALL SIP OF WATER. These include:  1. FLECAINIDE  2. METOPROLOL  3.  4.  5.  6.  ____Fleets enema or Magnesium Citrate as directed.   _x___ Use CHG Soap or sage wipes as directed on instruction sheet   _X___ Use inhalers on the day of surgery and bring to hospital day of surgery-BRING YOUR ALBUTEROL [place]  ____ Stop Metformin and Janumet 2 days prior to surgery.    _X___ Take 1/2 of usual insulin dose the night before surgery and none on the morning surgery-TAKE HALF OF YOUR TOUJEO THE NIGHT BEFORE SURGERY AND NO INSULIN THE MORNING  OF SURGERY  _x___ Follow recommendations from Cardiologist, Pulmonologist or PCP regarding stopping Aspirin, Coumadin, Plavix ,Eliquis, Effient, or Pradaxa, and Pletal-STOP XARELTO 3 DAYS PRIOR TO SURGERY AS INSTRUCTED BY OFFICE-LAST DOSE ON Friday, MAY 22ND  X____Stop Anti-inflammatories such as Advil, Aleve, Ibuprofen, Motrin, Naproxen, Naprosyn, Goodies powders or aspirin products NOW-OK to take Tylenol OR HYDROCODONE IF NEEDED   ____ Stop supplements until after surgery.    ____ Bring C-Pap to the hospital.

## 2019-05-19 LAB — URINE CULTURE: Culture: 40000 — AB

## 2019-05-19 LAB — SURGICAL PCR SCREEN
MRSA, PCR: NEGATIVE
Staphylococcus aureus: NEGATIVE

## 2019-05-19 LAB — NOVEL CORONAVIRUS, NAA (HOSP ORDER, SEND-OUT TO REF LAB; TAT 18-24 HRS): SARS-CoV-2, NAA: NOT DETECTED

## 2019-05-19 NOTE — Pre-Procedure Instructions (Signed)
NM myocardial perfusion SPECT multiple (stress and rest)03/16/2019 [place] Result Impression  Negative Lexiscan stress. LV function normal. No evidence of ischemia.  Low risk study.  Result Narrative  CARDIOLOGY DEPARTMENT [facility] [address], [place], [place] [address] [phone]  Procedure: Pharmacologic Myocardial Perfusion Imaging  ONE day procedure  Indication: Chest pain with moderate risk of acute coronary syndrome Plan: NM myocardial perfusion SPECT multiple (stress     and rest), ECG stress test only  Ordering Physician:   Dr. Bartholome Bill   Clinical History: 85 y.o. year old female Vitals: Height: 104 in Weight: 209 lb Cardiac risk factors include:   AFIB, CHF, Hyperlipidemia, Diabetes, HTN and Obesity    Procedure:  Pharmacologic stress testing was performed with Regadenoson using a single  use 0.4mg /66ml (0.08 mg/ml) prefilled syringe intravenously infused as a  bolus dose. The stress test was stopped due to Infusion completion. Blood  pressure response was normal. The patient did not develop any symptoms  other than fatigue during the procedure.   Rest HR: 68bpm Rest BP: 120/47mmHg Max HR: 83bpm Min BP: 118/19mmHg  Stress Test Administered by: Esperanza Richters, CMA  ECG Interpretation: Rest ECG: normal sinus rhythm, left bundle branch block (LBBB) Stress ECG: normal sinus rhythm, no arrhythmia or ischemia Recovery ECG: normal sinus rhythm ECG Interpretation: non-diagnostic due to pharmacologic testing.   Administrations This Visit   regadenoson (LEXISCAN) 0.4 mg/5 mL inj syringe 0.4 mg   Admin Date 03/16/2019 [place] Given Dose 0.4 mg Route Intravenous Administered By Herbert Seta, CNMT     technetium Tc44m sestamibi (CARDIOLITE) injection 94.58 millicurie   Admin Date 03/16/2019 Action Given Dose 59.29 millicurie Route Intravenous Administered By Herbert Seta, CNMT     technetium Tc45m sestamibi (CARDIOLITE) injection 2.44 millicurie   Admin Date 03/16/2019 Action Given Dose 6.28 millicurie Route Intravenous Administered By Herbert Seta, CNMT       Gated post-stress perfusion imaging was performed 30 minutes after stress.  Rest images were performed 30 minutes after injection.  Gated LV Analysis:  TID Ratio: 1.11  LVEF= 56%  FINDINGS: Regional wall motion: reveals normal myocardial thickening and wall  motion. The overall quality of the study is good.  Artifacts noted: no Left ventricular cavity: normal.  Perfusion Analysis: SPECT images demonstrate homogeneous tracer  distribution throughout the myocardium.   Status Results Details   Encounter Summary

## 2019-05-19 NOTE — Pre-Procedure Instructions (Signed)
Progress Notes - documented in this encounter Fath, Aloha Gell, MD - 03/09/2019 10:45 AM EDT Formatting of this note might be different from the original.   Chief Complaint: Chief Complaint  Patient presents with  . Follow-up  a.fib. pulse rate went up at 1:40am this morning, not feeling good at all during episode  . Edema  legs and feet (chronic)  Date of Service: 03/09/2019 Date of Birth: January 14, 1933 PCP: Cheryll Cockayne, MD  History of Present Illness: Tiffany [name] is a 85 y.o.female patient who returns for follow-up visit. She that she was in atrial fibrillation earlier this morning. She did not feel well during the episode. She recently has changed from furosemide to torsemide. She has minor reduced LV function EF 40 to 45%. She is contemplating undergoing knee surgery and has a functional study scheduled for next week to evaluate her ischemic risk for elective surgery. She has noted brief episodes of atrial fibrillation but none that are very sustained. Her husband who is a retired physician has been keeping track of her heart rhythm . She denies syncope or presyncope. She has been getting injections of steroids in her joints recently. He has been compliant with reduced dose metoprolol and valsartan-hydrochlorothiazide. She was taken off of digoxin due to bradycardia. She continues with flecainide 100 mg twice daily. Discussed taking an extra 50 mg of flecainide for breakthrough A. fib. She is anticoagulated with rivaroxaban at 20 mg daily. He is being followed by nephrology for chronic kidney disease grade 4. KG today revealed sinus rhythm with first-degree AV block and left bundle branch block at a rate of 79 with a PR interval of 266 ms, QRS duration 176 ms with a QTC of 534 ms and a QRS axis of -36 degrees. There is no ischemia. Past Medical and Surgical History  Past Medical History Past Medical History:  Diagnosis Date  . Anemia  . Atrial fibrillation (CMS-HCC)  . Bilateral  cataracts  . Chickenpox  . Chronic kidney disease, stage III  . DOE (dyspnea on exertion) 02/04/2015  . Hyperlipidemia  . Hypertension  . Osteoarthritis  . Right leg weakness  . Spinal stenosis  . Type 2 diabetes mellitus (CMS-HCC)  . Urinary tract bacterial infections   Past Surgical History She has a past surgical history that includes Back surgery (02/03/10); Shoulder arthroscopy distal clavicle excision and open rotator cuff repair; Cataract extraction (Bilateral); Cervical laminectomy; and Cardioversion External.   Medications and Allergies  Current Medications  Current Outpatient Medications  Medication Sig Dispense Refill  . acetaminophen (TYLENOL) 500 MG tablet Take 500 mg by mouth 2 (two) times daily Takes twice a day and then adds additional as needed  . albuterol sulfate (PROAIR RESPICLICK) 90 mcg/actuation AePB Inhale 2 inhalations into the lungs 4 (four) times daily as needed 1 each 11  . CONCENTRATED insulin glargine (TOUJEO SOLOSTAR U-300 INSULIN) pen injector (concentration 300 units/mL) Inject 20 Units subcutaneously nightly (Patient taking differently: Inject 20 Units subcutaneously nightly Averages 15 units (bases on blood sugar reading)) 4.5 mL 5  . flecainide (TAMBOCOR) 100 MG tablet TAKE 1 TABLET BY MOUTH TWICE DAILY 180 tablet 1  . glimepiride (AMARYL) 2 MG tablet TAKE 1 TABLET BY MOUTH ONCE DAILY WITH BREAKFAST 90 tablet 3  . HYDROcodone-acetaminophen (NORCO) 5-325 mg tablet Take 0.5 tablets by mouth every 8 (eight) hours as needed for Pain 30 tablet 0  . insulin ASPART (NOVOLOG FLEXPEN U-100 INSULIN) pen injector (concentration 100 units/mL) Take 8 units before supper.  Take 10 minutes before the meal. 15 mL 5  . metoprolol succinate (TOPROL-XL) 50 MG XL tablet Take 1 tablet (50 mg total) by mouth once daily (Patient taking differently: Take 25 mg by mouth once daily ) 90 tablet 4  . ONETOUCH VERIO test strip TEST TWICE DAILY 200 each 3  . pen needle, diabetic (BD  ULTRA-FINE NANO PEN NEEDLE) 32 gauge x 5/32" Ndle USE WITH INSULIN PEN TWICE DAILY OR AS DIRECTED 200 each 3  . polyethylene glycol (MIRALAX) powder Take 17 g by mouth once daily Mix in 4-8ounces of fluid prior to taking.  . rivaroxaban (XARELTO) 20 mg tablet Take 1 tablet (20 mg total) by mouth daily with breakfast 90 tablet 4  . TORsemide (DEMADEX) 20 MG tablet Take 20 mg by mouth once daily   No current facility-administered medications for this visit.   Allergies: Amlodipine; Codeine; and Doxycycline  Social and Family History  Social History reports that she has never smoked. She has never used smokeless tobacco. She reports that she does not drink alcohol or use drugs.  Family History Family History  Adopted: Yes  Family history unknown: Yes   Review of Systems  Review of Systems  Constitutional: Negative for chills, diaphoresis, fever and weight loss.  HENT: Negative for congestion, ear discharge, hearing loss and tinnitus.  Eyes: Negative for blurred vision.  Respiratory: Negative for cough, hemoptysis, sputum production, shortness of breath and wheezing.  Cardiovascular: Positive for palpitations and leg swelling. Negative for chest pain, orthopnea, claudication and PND.  Gastrointestinal: Negative for abdominal pain, blood in stool, constipation, diarrhea, heartburn, melena, nausea and vomiting.  Genitourinary: Negative for dysuria, frequency, hematuria and urgency.  Musculoskeletal: Positive for joint pain. Negative for back pain, falls and myalgias.  Skin: Negative for itching and rash.  Neurological: Negative for dizziness, tingling, focal weakness, loss of consciousness and headaches.  Endo/Heme/Allergies: Negative for polydipsia. Does not bruise/bleed easily.  Psychiatric/Behavioral: Negative for depression, memory loss and substance abuse. The patient is not nervous/anxious.    Physical Examination   Vitals:BP 118/72  Pulse 78  Ht 157.5 cm (5\' 2" )  Wt 94.8 kg  (209 lb)  SpO2 97%  BMI 38.23 kg/m  Ht:157.5 cm (5\' 2" ) Wt:94.8 kg (209 lb) VZS:MOLM surface area is 2.04 meters squared. Body mass index is 38.23 kg/m.  Wt Readings from Last 3 Encounters:  03/09/19 94.8 kg (209 lb)  03/03/19 97.1 kg (214 lb)  02/09/19 97.1 kg (214 lb)   BP Readings from Last 3 Encounters:  03/09/19 118/72  03/03/19 110/76  02/09/19 120/78   General appearance appears in no acute distress  Head Mouth and Eye exam Normocephalic, without obvious abnormality, atraumatic Dentition is good Eyes appear anicteric   LUNGS Breath Sounds: Normal Percussion: Normal  CARDIOVASCULAR JVP CV wave: no HJR: no Elevation at 90 degrees: None Carotid Pulse: normal pulsation bilaterally Bruit: None Apex: apical impulse normal  Auscultation Rhythm: normal sinus rhythm S1: normal S2: normal Clicks: no Rub: no Murmurs: 1/6 to 2/6 systolic murmur radiating to the left lower sternal border and axilla. Gallop: None ABDOMEN Liver enlargement: no Pulsatile aorta: no Ascites: no Bruits: no  EXTREMITIES Clubbing: no Edema: 2-3 + bilateral pedal edema Pulses: peripheral pulses symmetrical Femoral Bruits: no Amputation: no SKIN Rash: no Cyanosis: no Embolic phemonenon: no Bruising: no NEURO Alert and Oriented to person, place and time: yes Non focal: yes  PSYCH: Pt appears to have normal affect  LABS REVIEWED Last 3 CBC results: Lab Results  Component Value Date  WBC 12.7 (H) 01/23/2019  WBC 7.9 11/01/2018  WBC 11.4 (H) 02/09/2018   Lab Results  Component Value Date  HGB 13.0 01/23/2019  HGB 12.8 11/01/2018  HGB 12.6 02/09/2018   Lab Results  Component Value Date  HCT 41.2 01/23/2019  HCT 40.5 11/01/2018  HCT 39.7 02/09/2018   Lab Results  Component Value Date  PLT 274 01/23/2019  PLT 228 11/01/2018  PLT 299 02/09/2018   Lab Results  Component Value Date  CREATININE 1.8 (H) 02/06/2019  BUN 30 (H) 02/06/2019  NA 140 02/06/2019  K  4.1 02/06/2019  CL 104 02/06/2019  CO2 27.5 02/06/2019   Lab Results  Component Value Date  HGBA1C 7.0 (H) 09/07/2018   Lab Results  Component Value Date  ALT 12 01/23/2019  AST 9 01/23/2019  ALKPHOS 54 01/23/2019   Lab Results  Component Value Date  TSH 4.757 01/23/2019   Assessment and Plan   85 y.o. female with  ICD-10-CM ICD-9-CM  1. Essential hypertension-blood pressure is controlled currently with reduced dose metoprolol as well as reduced dose valsartan-hydrochlorothiazide. Will continue with this and dash diet. I10 401.9  2. Paroxysmal atrial fibrillation-currently back in sinus rhythm after cardioversion. Will continue with 100 mg twice daily of flecainide. Will continue to anticoagulate with Xarelto. May need to alter medications as patient gets older due to side effects however for now given her tolerance will continue with this. I48.0 427.31  3. Mixed hyperlipidemia the-continue with diet control E78.2 272.2  4. CKD (chronic kidney disease) stage 3, GFR 30-59 ml/min the-currently stable. We will carefully diurese with Lasix closely following her renal function. N18.3 585.3  5. Diabetes mellitus type 2, uncomplicated-continue with metformin and glimepiride with a hemoglobin A1c goal of less than 6. E11.9 250.00  6. Will proceed with myoview to determine if EF is diminished or if she has ischemia. If so we will need to come off of flecainide.  Return in about 6 months (around 09/09/2019).  These notes generated with voice recognition software. I apologize for typographical errors.  Sydnee Levans, MD    Electronically signed by Sydnee Levans, MD at 03/09/2019 11:17 AM EDT

## 2019-05-19 NOTE — Pre-Procedure Instructions (Signed)
ECG 12-lead3/11/2019 [place] Component Name Value Ref Range  Vent Rate (bpm) 79   PR Interval (msec) 266   QRS Interval (msec) 176   QT Interval (msec) 466   QTc (msec) 534   Other Result Information  This result has an attachment that is not available.  Result Narrative  Sinus rhythm 1st degree AV block Left axis deviation Left bundle branch block   When compared with ECG of 09-Feb-2018 12:29, Vent. rate has increased BY 36 BPM    I reviewed and concur with this report. Electronically signed RF:VOHK MD, KEN [phone]) on 03/21/2019 3:50:44 PM  Status Results Details

## 2019-05-22 MED ORDER — CEFAZOLIN SODIUM-DEXTROSE 2-4 GM/100ML-% IV SOLN
2.0000 g | Freq: Once | INTRAVENOUS | Status: AC
  Administered 2019-05-23: 11:00:00 2 g via INTRAVENOUS

## 2019-05-22 NOTE — TOC Progression Note (Signed)
Transition of Care [facility]) - Progression Note    Patient Details  Name: Tiffany [name] MRN: [id number] Date of Birth: 06/05/1933  Transition of Care [facility]) CM/SW [place], RN Phone Number: 05/22/2019, 9:43 AM  Clinical Narrative:     Lovenox price check requested from [facility] via email, will notify the patient of the price once obtained       Expected Discharge Plan and Services                                                 Social Determinants of Health (SDOH) Interventions    Readmission Risk Interventions No flowsheet data found.

## 2019-05-23 ENCOUNTER — Inpatient Hospital Stay: Payer: PPO

## 2019-05-23 ENCOUNTER — Inpatient Hospital Stay: Payer: PPO | Admitting: Anesthesiology

## 2019-05-23 ENCOUNTER — Other Ambulatory Visit: Payer: Self-pay

## 2019-05-23 ENCOUNTER — Encounter
Admission: RE | Disposition: A | Discharge status: Skilled Nursing Facility | Payer: Self-pay | Source: Home / Self Care | Attending: Surgery

## 2019-05-23 ENCOUNTER — Inpatient Hospital Stay
Admission: RE | Admit: 2019-05-23 | Discharge: 2019-05-26 | DRG: 470 | Disposition: A | Discharge status: Skilled Nursing Facility | Payer: PPO | Attending: Surgery | Admitting: Surgery

## 2019-05-23 DIAGNOSIS — Z9841 Cataract extraction status, right eye: Secondary | ICD-10-CM | POA: Diagnosis not present

## 2019-05-23 DIAGNOSIS — I5022 Chronic systolic (congestive) heart failure: Secondary | ICD-10-CM | POA: Diagnosis not present

## 2019-05-23 DIAGNOSIS — N390 Urinary tract infection, site not specified: Secondary | ICD-10-CM | POA: Diagnosis not present

## 2019-05-23 DIAGNOSIS — I129 Hypertensive chronic kidney disease with stage 1 through stage 4 chronic kidney disease, or unspecified chronic kidney disease: Secondary | ICD-10-CM | POA: Diagnosis present

## 2019-05-23 DIAGNOSIS — M1712 Unilateral primary osteoarthritis, left knee: Secondary | ICD-10-CM | POA: Diagnosis not present

## 2019-05-23 DIAGNOSIS — Z96652 Presence of left artificial knee joint: Secondary | ICD-10-CM

## 2019-05-23 DIAGNOSIS — I130 Hypertensive heart and chronic kidney disease with heart failure and stage 1 through stage 4 chronic kidney disease, or unspecified chronic kidney disease: Secondary | ICD-10-CM | POA: Diagnosis not present

## 2019-05-23 DIAGNOSIS — E1122 Type 2 diabetes mellitus with diabetic chronic kidney disease: Secondary | ICD-10-CM | POA: Diagnosis not present

## 2019-05-23 DIAGNOSIS — Z9842 Cataract extraction status, left eye: Secondary | ICD-10-CM | POA: Diagnosis not present

## 2019-05-23 DIAGNOSIS — Z794 Long term (current) use of insulin: Secondary | ICD-10-CM | POA: Diagnosis not present

## 2019-05-23 DIAGNOSIS — D649 Anemia, unspecified: Secondary | ICD-10-CM | POA: Diagnosis not present

## 2019-05-23 DIAGNOSIS — I4891 Unspecified atrial fibrillation: Secondary | ICD-10-CM | POA: Diagnosis present

## 2019-05-23 DIAGNOSIS — M25812 Other specified joint disorders, left shoulder: Secondary | ICD-10-CM | POA: Diagnosis not present

## 2019-05-23 DIAGNOSIS — N183 Chronic kidney disease, stage 3 (moderate): Secondary | ICD-10-CM | POA: Diagnosis not present

## 2019-05-23 DIAGNOSIS — R5381 Other malaise: Secondary | ICD-10-CM | POA: Diagnosis not present

## 2019-05-23 DIAGNOSIS — G894 Chronic pain syndrome: Secondary | ICD-10-CM | POA: Diagnosis not present

## 2019-05-23 DIAGNOSIS — Z87440 Personal history of urinary (tract) infections: Secondary | ICD-10-CM

## 2019-05-23 DIAGNOSIS — M5416 Radiculopathy, lumbar region: Secondary | ICD-10-CM | POA: Diagnosis not present

## 2019-05-23 DIAGNOSIS — Z7401 Bed confinement status: Secondary | ICD-10-CM | POA: Diagnosis not present

## 2019-05-23 DIAGNOSIS — E785 Hyperlipidemia, unspecified: Secondary | ICD-10-CM | POA: Diagnosis present

## 2019-05-23 DIAGNOSIS — Z79899 Other long term (current) drug therapy: Secondary | ICD-10-CM | POA: Diagnosis not present

## 2019-05-23 DIAGNOSIS — Z20828 Contact with and (suspected) exposure to other viral communicable diseases: Secondary | ICD-10-CM | POA: Diagnosis not present

## 2019-05-23 DIAGNOSIS — I480 Paroxysmal atrial fibrillation: Secondary | ICD-10-CM | POA: Diagnosis not present

## 2019-05-23 DIAGNOSIS — Z471 Aftercare following joint replacement surgery: Secondary | ICD-10-CM | POA: Diagnosis not present

## 2019-05-23 DIAGNOSIS — M1711 Unilateral primary osteoarthritis, right knee: Secondary | ICD-10-CM | POA: Diagnosis not present

## 2019-05-23 DIAGNOSIS — N189 Chronic kidney disease, unspecified: Secondary | ICD-10-CM | POA: Diagnosis not present

## 2019-05-23 DIAGNOSIS — M2550 Pain in unspecified joint: Secondary | ICD-10-CM | POA: Diagnosis not present

## 2019-05-23 DIAGNOSIS — M1990 Unspecified osteoarthritis, unspecified site: Secondary | ICD-10-CM | POA: Diagnosis not present

## 2019-05-23 HISTORY — PX: TOTAL KNEE ARTHROPLASTY: SHX125

## 2019-05-23 LAB — GLUCOSE, CAPILLARY
Glucose-Capillary: 149 mg/dL — ABNORMAL HIGH (ref 70–99)
Glucose-Capillary: 121 mg/dL — ABNORMAL HIGH (ref 70–99)
Glucose-Capillary: 185 mg/dL — ABNORMAL HIGH (ref 70–99)

## 2019-05-23 LAB — ABO/RH: ABO/RH(D): A POS

## 2019-05-23 SURGERY — ARTHROPLASTY, KNEE, TOTAL
Anesthesia: Spinal | Laterality: Left

## 2019-05-23 MED ORDER — BUPIVACAINE LIPOSOME 1.3 % IJ SUSP: 20.0000 mL | Freq: Once | INTRAMUSCULAR | Status: DC

## 2019-05-23 MED ORDER — DOCUSATE SODIUM 100 MG PO CAPS
100.0000 mg | ORAL_CAPSULE | Freq: Two times a day (BID) | ORAL | Status: DC
  Administered 2019-05-23 – 2019-05-26 (×6): 100 mg via ORAL
  Filled 2019-05-23 (×6): qty 1

## 2019-05-23 MED ORDER — FAMOTIDINE 20 MG PO TABS
20.0000 mg | ORAL_TABLET | Freq: Once | ORAL | Status: AC
  Administered 2019-05-23: 09:00:00 20 mg via ORAL

## 2019-05-23 MED ORDER — FLECAINIDE ACETATE 100 MG PO TABS
100.0000 mg | ORAL_TABLET | Freq: Two times a day (BID) | ORAL | Status: DC
  Administered 2019-05-23 – 2019-05-26 (×6): 100 mg via ORAL
  Filled 2019-05-23 (×7): qty 1

## 2019-05-23 MED ORDER — ONDANSETRON HCL 4 MG PO TABS
4.0000 mg | ORAL_TABLET | Freq: Four times a day (QID) | ORAL | Status: DC | PRN
  Administered 2019-05-25: 4 mg via ORAL
  Filled 2019-05-23: qty 1

## 2019-05-23 MED ORDER — BISACODYL 10 MG RE SUPP
10.0000 mg | Freq: Every day | RECTAL | Status: DC | PRN
  Administered 2019-05-25: 10 mg via RECTAL
  Filled 2019-05-23: qty 1

## 2019-05-23 MED ORDER — BUPIVACAINE-EPINEPHRINE (PF) 0.5% -1:200000 IJ SOLN
INTRAMUSCULAR | Status: DC | PRN
  Administered 2019-05-23: 30 mL

## 2019-05-23 MED ORDER — ALBUTEROL SULFATE (2.5 MG/3ML) 0.083% IN NEBU: 2.5000 mg | INHALATION_SOLUTION | RESPIRATORY_TRACT | Status: DC | PRN

## 2019-05-23 MED ORDER — ONDANSETRON HCL 4 MG/2ML IJ SOLN
4.0000 mg | Freq: Four times a day (QID) | INTRAMUSCULAR | Status: DC | PRN
  Administered 2019-05-24: 07:00:00 4 mg via INTRAVENOUS
  Filled 2019-05-23: qty 2

## 2019-05-23 MED ORDER — BUPIVACAINE HCL (PF) 0.5 % IJ SOLN
INTRAMUSCULAR | Status: AC
  Filled 2019-05-23: qty 10

## 2019-05-23 MED ORDER — EPINEPHRINE PF 1 MG/ML IJ SOLN
INTRAMUSCULAR | Status: AC
  Filled 2019-05-23: qty 1

## 2019-05-23 MED ORDER — INSULIN ASPART 100 UNIT/ML ~~LOC~~ SOLN
8.0000 [IU] | Freq: Every evening | SUBCUTANEOUS | Status: DC
  Administered 2019-05-24: 8 [IU] via SUBCUTANEOUS
  Filled 2019-05-23: qty 1

## 2019-05-23 MED ORDER — TRAMADOL HCL 50 MG PO TABS
50.0000 mg | ORAL_TABLET | Freq: Four times a day (QID) | ORAL | Status: DC | PRN
  Administered 2019-05-24: 50 mg via ORAL
  Filled 2019-05-23: qty 1

## 2019-05-23 MED ORDER — TORSEMIDE 20 MG PO TABS
10.0000 mg | ORAL_TABLET | Freq: Every day | ORAL | Status: DC
  Administered 2019-05-24 – 2019-05-26 (×3): 10 mg via ORAL
  Filled 2019-05-23 (×4): qty 1

## 2019-05-23 MED ORDER — BUPIVACAINE-EPINEPHRINE (PF) 0.5% -1:200000 IJ SOLN
INTRAMUSCULAR | Status: AC
  Filled 2019-05-23: qty 30

## 2019-05-23 MED ORDER — FAMOTIDINE 20 MG PO TABS
ORAL_TABLET | ORAL | Status: AC
  Filled 2019-05-23: qty 1

## 2019-05-23 MED ORDER — TRANEXAMIC ACID 1000 MG/10ML IV SOLN
INTRAVENOUS | Status: AC
  Filled 2019-05-23: qty 10

## 2019-05-23 MED ORDER — MAGNESIUM HYDROXIDE 400 MG/5ML PO SUSP
30.0000 mL | Freq: Every day | ORAL | Status: DC | PRN
  Administered 2019-05-26: 30 mL via ORAL
  Filled 2019-05-23 (×3): qty 30

## 2019-05-23 MED ORDER — ACETAMINOPHEN 325 MG PO TABS: 325.0000 mg | ORAL_TABLET | Freq: Four times a day (QID) | ORAL | Status: DC | PRN

## 2019-05-23 MED ORDER — METOCLOPRAMIDE HCL 5 MG/ML IJ SOLN: 5.0000 mg | Freq: Three times a day (TID) | INTRAMUSCULAR | Status: DC | PRN

## 2019-05-23 MED ORDER — SODIUM CHLORIDE 0.9 % IV SOLN
INTRAVENOUS | Status: DC | PRN
  Administered 2019-05-23: 12:00:00 60 mL

## 2019-05-23 MED ORDER — METOPROLOL SUCCINATE ER 25 MG PO TB24
25.0000 mg | ORAL_TABLET | ORAL | Status: DC
  Administered 2019-05-24 – 2019-05-26 (×3): 25 mg via ORAL
  Filled 2019-05-23 (×4): qty 1

## 2019-05-23 MED ORDER — HYDROMORPHONE HCL 1 MG/ML IJ SOLN: 0.2500 mg | INTRAMUSCULAR | Status: DC | PRN

## 2019-05-23 MED ORDER — LIDOCAINE HCL (PF) 2 % IJ SOLN
INTRAMUSCULAR | Status: AC
  Filled 2019-05-23: qty 10

## 2019-05-23 MED ORDER — SODIUM CHLORIDE 0.9 % IV SOLN
INTRAVENOUS | Status: DC
  Administered 2019-05-23: 18:00:00 via INTRAVENOUS

## 2019-05-23 MED ORDER — ACETAMINOPHEN 500 MG PO TABS
1000.0000 mg | ORAL_TABLET | Freq: Four times a day (QID) | ORAL | Status: AC
  Administered 2019-05-23 – 2019-05-24 (×3): 1000 mg via ORAL
  Filled 2019-05-23 (×3): qty 2

## 2019-05-23 MED ORDER — TRANEXAMIC ACID 1000 MG/10ML IV SOLN
INTRAVENOUS | Status: DC | PRN
  Administered 2019-05-23: 180 mg via TOPICAL

## 2019-05-23 MED ORDER — PROPOFOL 500 MG/50ML IV EMUL
INTRAVENOUS | Status: AC
  Filled 2019-05-23: qty 50

## 2019-05-23 MED ORDER — FLEET ENEMA 7-19 GM/118ML RE ENEM: 1.0000 | ENEMA | Freq: Once | RECTAL | Status: DC | PRN

## 2019-05-23 MED ORDER — CEFAZOLIN SODIUM-DEXTROSE 2-4 GM/100ML-% IV SOLN
2.0000 g | Freq: Four times a day (QID) | INTRAVENOUS | Status: AC
  Administered 2019-05-23 – 2019-05-24 (×3): 2 g via INTRAVENOUS
  Filled 2019-05-23 (×2): qty 100

## 2019-05-23 MED ORDER — PROPOFOL 500 MG/50ML IV EMUL
INTRAVENOUS | Status: DC | PRN
  Administered 2019-05-23: 25 ug/kg/min via INTRAVENOUS

## 2019-05-23 MED ORDER — CEFAZOLIN SODIUM-DEXTROSE 2-4 GM/100ML-% IV SOLN
INTRAVENOUS | Status: AC
  Filled 2019-05-23: qty 100

## 2019-05-23 MED ORDER — GLIMEPIRIDE 2 MG PO TABS
2.0000 mg | ORAL_TABLET | Freq: Every day | ORAL | Status: DC
  Administered 2019-05-24 – 2019-05-26 (×3): 2 mg via ORAL
  Filled 2019-05-23 (×3): qty 1

## 2019-05-23 MED ORDER — DIPHENHYDRAMINE HCL 12.5 MG/5ML PO ELIX
12.5000 mg | ORAL_SOLUTION | ORAL | Status: DC | PRN
  Filled 2019-05-23: qty 10

## 2019-05-23 MED ORDER — MIDAZOLAM HCL 5 MG/5ML IJ SOLN
INTRAMUSCULAR | Status: DC | PRN
  Administered 2019-05-23: 1 mg via INTRAVENOUS

## 2019-05-23 MED ORDER — GENTAMICIN SULFATE 40 MG/ML IJ SOLN
INTRAMUSCULAR | Status: AC
  Filled 2019-05-23: qty 8

## 2019-05-23 MED ORDER — METOCLOPRAMIDE HCL 10 MG PO TABS
5.0000 mg | ORAL_TABLET | Freq: Three times a day (TID) | ORAL | Status: DC | PRN
  Filled 2019-05-23: qty 1

## 2019-05-23 MED ORDER — ONDANSETRON HCL 4 MG/2ML IJ SOLN
INTRAMUSCULAR | Status: AC
  Filled 2019-05-23: qty 2

## 2019-05-23 MED ORDER — RIVAROXABAN 20 MG PO TABS
20.0000 mg | ORAL_TABLET | Freq: Every day | ORAL | Status: DC
  Administered 2019-05-24: 20 mg via ORAL
  Filled 2019-05-23: qty 1

## 2019-05-23 MED ORDER — MIDAZOLAM HCL 2 MG/2ML IJ SOLN
INTRAMUSCULAR | Status: AC
  Filled 2019-05-23: qty 2

## 2019-05-23 MED ORDER — PROPOFOL 10 MG/ML IV BOLUS
INTRAVENOUS | Status: DC | PRN
  Administered 2019-05-23: 10 mg via INTRAVENOUS
  Administered 2019-05-23: 40 mg via INTRAVENOUS

## 2019-05-23 MED ORDER — SODIUM CHLORIDE (PF) 0.9 % IJ SOLN
INTRAMUSCULAR | Status: AC
  Filled 2019-05-23: qty 50

## 2019-05-23 MED ORDER — SODIUM CHLORIDE 0.9 % IV SOLN
INTRAVENOUS | Status: DC | PRN
  Administered 2019-05-23: 11:00:00 30 ug/min via INTRAVENOUS

## 2019-05-23 MED ORDER — EPHEDRINE SULFATE 50 MG/ML IJ SOLN
INTRAMUSCULAR | Status: DC | PRN
  Administered 2019-05-23 (×3): 10 mg via INTRAVENOUS

## 2019-05-23 MED ORDER — LIDOCAINE HCL (PF) 2 % IJ SOLN
INTRAMUSCULAR | Status: DC | PRN
  Administered 2019-05-23: 25 mg

## 2019-05-23 MED ORDER — EPINEPHRINE PF 1 MG/ML IJ SOLN
INTRAMUSCULAR | Status: DC | PRN
  Administered 2019-05-23: .0001 mL via INTRATHECAL

## 2019-05-23 MED ORDER — OXYCODONE HCL 5 MG PO TABS
5.0000 mg | ORAL_TABLET | ORAL | Status: DC | PRN
  Administered 2019-05-23 – 2019-05-24 (×3): 10 mg via ORAL
  Administered 2019-05-24 – 2019-05-25 (×3): 5 mg via ORAL
  Filled 2019-05-23: qty 1
  Filled 2019-05-23: qty 2
  Filled 2019-05-23: qty 1
  Filled 2019-05-23: qty 2
  Filled 2019-05-23 (×3): qty 1

## 2019-05-23 MED ORDER — SODIUM CHLORIDE 0.9 % IV SOLN
INTRAVENOUS | Status: DC
  Administered 2019-05-23: 12:00:00 via INTRAVENOUS
  Administered 2019-05-23: 1000 mL via INTRAVENOUS
  Administered 2019-05-23: 13:00:00 via INTRAVENOUS

## 2019-05-23 MED ORDER — ONDANSETRON HCL 4 MG/2ML IJ SOLN
INTRAMUSCULAR | Status: DC | PRN
  Administered 2019-05-23: 4 mg via INTRAVENOUS

## 2019-05-23 MED ORDER — FENTANYL CITRATE (PF) 100 MCG/2ML IJ SOLN: 25.0000 ug | INTRAMUSCULAR | Status: DC | PRN

## 2019-05-23 MED ORDER — PHENYLEPHRINE HCL (PRESSORS) 10 MG/ML IV SOLN
INTRAVENOUS | Status: DC | PRN
  Administered 2019-05-23 (×4): 100 ug via INTRAVENOUS

## 2019-05-23 MED ORDER — BUPIVACAINE HCL (PF) 0.5 % IJ SOLN
INTRAMUSCULAR | Status: DC | PRN
  Administered 2019-05-23: 3 mL via INTRATHECAL

## 2019-05-23 MED ORDER — BUPIVACAINE LIPOSOME 1.3 % IJ SUSP
INTRAMUSCULAR | Status: AC
  Filled 2019-05-23: qty 20

## 2019-05-23 MED ORDER — POLYETHYLENE GLYCOL 3350 17 G PO PACK: 17.0000 g | PACK | Freq: Every day | ORAL | Status: DC | PRN

## 2019-05-23 MED ORDER — INSULIN ASPART 100 UNIT/ML ~~LOC~~ SOLN
0.0000 [IU] | Freq: Three times a day (TID) | SUBCUTANEOUS | Status: DC
  Administered 2019-05-23 – 2019-05-24 (×4): 3 [IU] via SUBCUTANEOUS
  Administered 2019-05-25: 12:00:00 8 [IU] via SUBCUTANEOUS
  Administered 2019-05-25 – 2019-05-26 (×3): 3 [IU] via SUBCUTANEOUS
  Filled 2019-05-23 (×8): qty 1

## 2019-05-23 MED ORDER — SODIUM CHLORIDE 0.9 % IV SOLN
INTRAVENOUS | Status: DC | PRN
  Administered 2019-05-23: 12:00:00 100 mL

## 2019-05-23 SURGICAL SUPPLY — 63 items
BANDAGE ELASTIC 6 LF NS (GAUZE/BANDAGES/DRESSINGS) ×3 IMPLANT
BEARING TIBIAL KNEE AS 10X75 (Knees) ×1 IMPLANT
BEARING TIBIAL KNEE AS 10X75MM (Knees) ×1 IMPLANT
BLADE SAW SAG 25X90X1.19 (BLADE) ×3 IMPLANT
BLADE SURG SZ20 CARB STEEL (BLADE) ×3 IMPLANT
CANISTER SUCT 1200ML W/VALVE (MISCELLANEOUS) ×3 IMPLANT
CANISTER SUCT 3000ML PPV (MISCELLANEOUS) ×3 IMPLANT
CEMENT BONE R 1X40 (Cement) ×6 IMPLANT
CEMENT VACUUM MIXING SYSTEM (MISCELLANEOUS) ×3 IMPLANT
CHLORAPREP W/TINT 26 (MISCELLANEOUS) ×3 IMPLANT
COOLER POLAR GLACIER W/PUMP (MISCELLANEOUS) ×3 IMPLANT
COVER MAYO STAND STRL (DRAPES) ×3 IMPLANT
COVER WAND RF STERILE (DRAPES) ×3 IMPLANT
CUFF TOURN SGL QUICK 24 (TOURNIQUET CUFF)
CUFF TOURN SGL QUICK 30 (TOURNIQUET CUFF) ×2
CUFF TRNQT CYL 24X4X16.5-23 (TOURNIQUET CUFF) IMPLANT
CUFF TRNQT CYL 30X4X21-28X (TOURNIQUET CUFF) IMPLANT
DRAPE IMP U-DRAPE 54X76 (DRAPES) ×3 IMPLANT
DRAPE SHEET LG 3/4 BI-LAMINATE (DRAPES) ×3 IMPLANT
DRSG OPSITE POSTOP 4X10 (GAUZE/BANDAGES/DRESSINGS) ×3 IMPLANT
DRSG OPSITE POSTOP 4X8 (GAUZE/BANDAGES/DRESSINGS) ×1 IMPLANT
ELECT CAUTERY BLADE 6.4 (BLADE) ×3 IMPLANT
ELECT REM PT RETURN 9FT ADLT (ELECTROSURGICAL) ×3
ELECTRODE REM PT RTRN 9FT ADLT (ELECTROSURGICAL) ×1 IMPLANT
FEMORAL CR LEFT  70MM (Joint) ×2 IMPLANT
FEMORAL CR LEFT 70MM (Joint) IMPLANT
GLOVE BIO SURGEON STRL SZ7.5 (GLOVE) ×12 IMPLANT
GLOVE BIO SURGEON STRL SZ8 (GLOVE) ×12 IMPLANT
GLOVE BIOGEL PI IND STRL 8 (GLOVE) ×1 IMPLANT
GLOVE BIOGEL PI INDICATOR 8 (GLOVE) ×2
GLOVE INDICATOR 8.0 STRL GRN (GLOVE) ×3 IMPLANT
GOWN STRL REUS W/ TWL LRG LVL3 (GOWN DISPOSABLE) ×1 IMPLANT
GOWN STRL REUS W/ TWL XL LVL3 (GOWN DISPOSABLE) ×1 IMPLANT
GOWN STRL REUS W/TWL LRG LVL3 (GOWN DISPOSABLE) ×2
GOWN STRL REUS W/TWL XL LVL3 (GOWN DISPOSABLE) ×2
HOLDER FOLEY CATH W/STRAP (MISCELLANEOUS) ×3 IMPLANT
HOOD PEEL AWAY FLYTE STAYCOOL (MISCELLANEOUS) ×9 IMPLANT
IMMBOLIZER KNEE 19 BLUE UNIV (SOFTGOODS) ×3 IMPLANT
KIT TURNOVER KIT A (KITS) ×3 IMPLANT
NDL SAFETY ECLIPSE 18X1.5 (NEEDLE) ×2 IMPLANT
NDL SPNL 20GX3.5 QUINCKE YW (NEEDLE) ×1 IMPLANT
NEEDLE HYPO 18GX1.5 SHARP (NEEDLE) ×4
NEEDLE SPNL 20GX3.5 QUINCKE YW (NEEDLE) ×3 IMPLANT
NS IRRIG 1000ML POUR BTL (IV SOLUTION) ×3 IMPLANT
PACK TOTAL KNEE (MISCELLANEOUS) ×3 IMPLANT
PAD WRAPON POLAR KNEE (MISCELLANEOUS) ×1 IMPLANT
PATELLA STD 34X8.5 (Orthopedic Implant) ×2 IMPLANT
PLATE KNEE TIBIAL 75MM FIXED (Plate) ×2 IMPLANT
PULSAVAC PLUS IRRIG FAN TIP (DISPOSABLE) ×3
SOL .9 NS 3000ML IRR  AL (IV SOLUTION) ×2
SOL .9 NS 3000ML IRR UROMATIC (IV SOLUTION) ×1 IMPLANT
STAPLER SKIN PROX 35W (STAPLE) ×3 IMPLANT
SUCTION FRAZIER HANDLE 10FR (MISCELLANEOUS) ×2
SUCTION TUBE FRAZIER 10FR DISP (MISCELLANEOUS) ×1 IMPLANT
SUT VIC AB 0 CT1 36 (SUTURE) ×9 IMPLANT
SUT VIC AB 2-0 CT1 27 (SUTURE) ×8
SUT VIC AB 2-0 CT1 TAPERPNT 27 (SUTURE) ×3 IMPLANT
SYR 10ML LL (SYRINGE) ×3 IMPLANT
SYR 20CC LL (SYRINGE) ×3 IMPLANT
SYR 30ML LL (SYRINGE) ×9 IMPLANT
TIP FAN IRRIG PULSAVAC PLUS (DISPOSABLE) ×1 IMPLANT
TRAY FOLEY MTR SLVR 16FR STAT (SET/KITS/TRAYS/PACK) ×3 IMPLANT
WRAPON POLAR PAD KNEE (MISCELLANEOUS) ×3

## 2019-05-23 NOTE — Anesthesia Preprocedure Evaluation (Addendum)
Anesthesia Evaluation  Patient identified by MRN, date of birth, ID band Patient awake    Reviewed: Allergy & Precautions, H&P , NPO status , Patient's Chart, lab work & pertinent test results  History of Anesthesia Complications (+) PONV and history of anesthetic complications  Airway Mallampati: II  TM Distance: >3 FB Neck ROM: full    Dental  (+) Teeth Intact   Pulmonary neg pulmonary ROS, neg COPD,           Cardiovascular hypertension, (-) angina(-) Past MI, (-) Cardiac Stents, (-) CABG and (-) CHF + dysrhythmias (on Xarelto, last dose 5 days ago) Atrial Fibrillation      Neuro/Psych H/o spinal stenosis s/p lumbar surgery L3-4 decompression, L5-6 hardware Denies LE weakness/numbness/tingling negative psych ROS   GI/Hepatic Neg liver ROS, GERD  Controlled,  Endo/Other  diabetes, Insulin Dependent  Renal/GU CRFRenal disease     Musculoskeletal   Abdominal   Peds  Hematology negative hematology ROS (+)   Anesthesia Other Findings Obesity  Past Medical History: No date: A-fib (HCC) No date: Anemia No date: Arthritis No date: Cataracts, both eyes No date: Chickenpox No date: Chronic kidney disease No date: Complication of anesthesia No date: Diabetes mellitus without complication (HCC) No date: Dyspnea     Comment:  with exertion No date: Dysrhythmia No date: Hyperlipidemia No date: Hypertension No date: PONV (postoperative nausea and vomiting)     Comment:  with 1st pregnancy 50 years ago and no problem since               then No date: Right leg weakness No date: Spinal stenosis No date: UTI (urinary tract infection)  Past Surgical History: No date: BACK SURGERY     Comment:  x2 -cervical fusion and mid-lower back surgery No date: CARDIOVERSION No date: CATARACT EXTRACTION, BILATERAL No date: CERVICAL LAMINECTOMY 01/12/2017: ELECTROPHYSIOLOGIC STUDY; N/A     Comment:  Procedure: Cardioversion;   Surgeon: Teodoro Spray, MD;               Location: [facility] ORS;  Service: Cardiovascular;                Laterality: N/A; No date: SHOULDER SURGERY  BMI    Body Mass Index:  37.49 kg/m      Reproductive/Obstetrics negative OB ROS                           Anesthesia Physical Anesthesia Plan  ASA: III  Anesthesia Plan: Spinal   Post-op Pain Management:    Induction:   PONV Risk Score and Plan:   Airway Management Planned:   Additional Equipment:   Intra-op Plan:   Post-operative Plan:   Informed Consent: I have reviewed the patients History and Physical, chart, labs and discussed the procedure including the risks, benefits and alternatives for the proposed anesthesia with the patient or authorized representative who has indicated his/her understanding and acceptance.     Dental Advisory Given  Plan Discussed with: Anesthesiologist and CRNA  Anesthesia Plan Comments: (Discussed risks/benefits in detail of spinal vs GETA.  Pt prefers to try spinal, will plan for L3-L4.  If unsuccessful, GETA)        Anesthesia Quick Evaluation

## 2019-05-23 NOTE — Transfer of Care (Signed)
Immediate Anesthesia Transfer of Care Note  Patient: Tiffany [name]  Procedure(s) Performed: TOTAL KNEE ARTHROPLASTY - LEFT - DIABETIC (Left )  Patient Location: PACU  Anesthesia Type:Spinal  Level of Consciousness: awake and alert   Airway & Oxygen Therapy: Patient Spontanous Breathing  Post-op Assessment: Report given to RN and Post -op Vital signs reviewed and stable  Post vital signs: Reviewed  Last Vitals:  Vitals Value Taken Time  BP 108/91 05/23/2019 12:42 PM  Temp    Pulse 71 05/23/2019 12:44 PM  Resp 19 05/23/2019 12:44 PM  SpO2 99 % 05/23/2019 12:44 PM  Vitals shown include unvalidated device data.  Last Pain:  Vitals:   05/23/19 0838  TempSrc: Temporal         Complications: No apparent anesthesia complications

## 2019-05-23 NOTE — Anesthesia Postprocedure Evaluation (Signed)
Anesthesia Post Note  Patient: Tiffany [name]  Procedure(s) Performed: TOTAL KNEE ARTHROPLASTY - LEFT - DIABETIC (Left )  Patient location during evaluation: PACU Anesthesia Type: Spinal Level of consciousness: awake and alert Pain management: pain level controlled Vital Signs Assessment: post-procedure vital signs reviewed and stable Respiratory status: spontaneous breathing, nonlabored ventilation and respiratory function stable Cardiovascular status: blood pressure returned to baseline and stable Postop Assessment: no apparent nausea or vomiting Anesthetic complications: no     Last Vitals:  Vitals:   05/23/19 1415 05/23/19 1430  BP: 130/66 119/85  Pulse: 80 68  Resp: (!) 22 14  Temp:    SpO2: 98% 98%    Last Pain:  Vitals:   05/23/19 1350  TempSrc:   PainSc: 0-No pain                 Durenda Hurt

## 2019-05-23 NOTE — Anesthesia Procedure Notes (Signed)
Spinal  Patient location during procedure: OR Staffing Anesthesiologist: Durenda Hurt, MD Resident/CRNA: Rolla Plate, CRNA Performed: resident/CRNA  Preanesthetic Checklist Completed: patient identified, site marked, surgical consent, pre-op evaluation, timeout performed, IV checked, risks and benefits discussed and monitors and equipment checked Spinal Block Patient position: sitting Prep: ChloraPrep and site prepped and draped Patient monitoring: heart rate, continuous pulse ox, blood pressure and cardiac monitor Approach: midline Location: L3-4 Injection technique: single-shot Needle Needle type: Introducer and Pencan  Needle gauge: 24 G Needle length: 9 cm Additional Notes Negative paresthesia. Negative blood return. Positive free-flowing CSF. Expiration date of kit checked and confirmed. Patient tolerated procedure well, without complications.

## 2019-05-23 NOTE — H&P (Signed)
Paper H&P to be scanned into permanent record. H&P reviewed and patient re-examined. No changes. 

## 2019-05-23 NOTE — Op Note (Signed)
05/23/2019  12:39 PM  Patient:   Tiffany [name]  Pre-Op Diagnosis:   Degenerative joint disease, left knee.  Post-Op Diagnosis:   Same  Procedure:   Left TKA using all-cemented [employer] Vanguard system with a 70 mm PCR femur, a 75 mm tibial tray with a 10 mm anterior stabilized e-poly insert, and a 34 x 8.5 mm all-poly 3-pegged domed patella.  Surgeon:   Pascal Lux, MD  Assistant:   Cameron Proud, PA-C   Anesthesia:   Spinal  Findings:   As above  Complications:   None  EBL:   200 cc  Fluids:   1700 cc crystalloid  UOP:   500 cc  TT:   71 minutes at 300 mmHg  Drains:   None  Closure:   Staples  Implants:   As above  Brief Clinical Note:   The patient is an 85 year old female with a long history of progressively worsening left knee pain. The patient's symptoms have progressed despite medications, activity modification, injections, etc. The patient's history and examination were consistent with advanced degenerative joint disease of the right knee confirmed by plain radiographs. The patient presents at this time for a left total knee arthroplasty.  Procedure:   The patient was brought into the operating room. After adequate spinal anesthesia was obtained, the patient was lain in the supine position. A Foley catheter was placed by the nurse before the right lower extremity was prepped with ChloraPrep solution and draped sterilely. Preoperative antibiotics were administered. After verifying the proper laterality with a surgical timeout, the limb was exsanguinated with an Esmarch and the tourniquet inflated to 300 mmHg. A standard anterior approach to the knee was made through an approximately 7 inch incision. The incision was carried down through the subcutaneous tissues to expose superficial retinaculum. This was split the length of the incision and the medial flap elevated sufficiently to expose the medial retinaculum. The medial retinaculum was incised, leaving a 3-4 mm cuff  of tissue on the patella. This was extended distally along the medial border of the patellar tendon and proximally through the medial third of the quadriceps tendon. A subtotal fat pad excision was performed before the soft tissues were elevated off the anteromedial and anterolateral aspects of the proximal tibia to the level of the collateral ligaments. The anterior portions of the medial and lateral menisci were removed, as was the anterior cruciate ligament. With the knee flexed to 90, the external tibial guide was positioned and the appropriate proximal tibial cut made. This piece was taken to the back table where it was measured and found to be optimally replicated by a 75 mm component.  Attention was directed to the distal femur. The intramedullary canal was accessed through a 3/8" drill hole. The intramedullary guide was inserted and positioned in order to obtain a neutral flexion gap. The intercondylar block was positioned with care taken to avoid notching the anterior cortex of the femur. The appropriate cut was made. Next, the distal cutting block was placed at 6 of valgus alignment. Using the 9 mm slot, the distal cut was made. The distal femur was measured and found to be optimally replicated by the 70 mm component. The 70 mm 4-in-1 cutting block was positioned and first the posterior, then the posterior chamfer, the anterior chamfer, and finally the anterior cuts were made. At this point, the posterior portions medial and lateral menisci were removed. A trial reduction was performed using the appropriate femoral and tibial components with  the 10 mm insert. This demonstrated excellent stability to varus and valgus stressing both in flexion and extension while permitting full extension. Patella tracking was assessed and found to be excellent. Therefore, the tibial guide position was marked on the proximal tibia. The patella thickness was measured and found to be 22 mm. Therefore, the appropriate cut  was made. The patellar surface was measured and found to be optimally replicated by the 34 mm component. The three peg holes were drilled in place before the trial button was inserted. Patella tracking was assessed and found to be excellent, passing the "no thumb test". The lug holes were drilled into the distal femur before the trial component was removed, leaving only the tibial tray. The keel was then created using the appropriate tower, reamer, and punch.  The bony surfaces were prepared for cementing by irrigating them thoroughly with bacitracin saline solution via the jet lavage system. A bone plug was fashioned from some of the bone that had been removed previously and used to plug the distal femoral canal. In addition, 20 cc of Exparel diluted out to 60 cc with normal saline and 30 cc of 0.5% Sensorcaine were injected into the postero-medial and postero-lateral aspects of the knee, the medial and lateral gutter regions, and the peri-incisional tissues to help with postoperative analgesia. Meanwhile, the cement was being mixed on the back table. When it was ready, the tibial tray was cemented in first. The excess cement was removed using [employer] fast streamer. Next, the femoral component was impacted into place. Again, the excess cement was removed using [employer] fast streamer. The 10 mm trial insert was positioned and the knee brought into extension while the cement hardened. Finally, the patella was cemented into place and secured using the patellar clamp. Again, the excess cement was removed using [employer] fast streamer. Once the cement had hardened, the knee was placed through a range of motion with the findings as described above. There appeared to be slight anteroposterior laxity with anterior drawer testing. Therefore, the trial insert was removed and, after verifying that no cement had been retained posteriorly, the permanent 10 mm anterior stabilized E-polyethylene insert was positioned and secured using the  appropriate key locking mechanism. Again the knee was placed through a range of motion with the findings as described above.  The wound was copiously irrigated with bacitracin saline solution using the jet lavage system before the quadriceps tendon and retinacular layer were reapproximated using #0 Vicryl interrupted sutures. The superficial retinacular layer also was closed using a running #0 Vicryl suture. A total of 10 cc of transexemic acid (TXA) was injected intra-articularly before the subcutaneous tissues were closed in several layers using 2-0 Vicryl interrupted sutures. The skin was closed using staples. A sterile honeycomb dressing was applied to the skin before the leg was wrapped with an Ace wrap to accommodate the Polar Care device. The patient was then awakened and returned to the recovery room in satisfactory condition after tolerating the procedure well.

## 2019-05-23 NOTE — TOC Benefit Eligibility Note (Signed)
Transition of Care [facility]) Benefit Eligibility Note    Patient Details  Name: Tiffany [name] MRN: [id number] Date of Birth: 06-Nov-1933   Medication/Dose: Lovenox 40mg  once daily for 14 days  Covered?: No   Prescription Coverage Preferred Pharmacy: Any retail pharmacy  Spoke with Person/Company/Phone Number:: Caren Griffins with [employer]   Prior Approval: (PA required for name brand: [phone])  Deductible: (Per rep, no deductible on plan.)  Additional Notes: Generic Enoxaparin covered with no PA required. Considered Tier 4 medication.  Estimated copay $63.46.     Dannette Barbara Phone Number: [phone] or [phone] 05/23/2019, 9:21 AM

## 2019-05-23 NOTE — Evaluation (Signed)
Physical Therapy Evaluation Patient Details Name: Tiffany [name] MRN: [id number] DOB: [birth year] Today's Date: 05/23/2019   History of Present Illness  85 y/o female s/p L TKA 05/23/19.  Clinical Impression  Pt did well with POD0 PT exam.  She was still having some numbness, but showed good quad control and showed great effort with ~15 minutes of exercises apart from exam.  She was able to tolerate weight through L LE w/o buckling or unsteadiness and did well with limited first effort of ambulation and was able to get to the recliner with good effort.  Pt had no LOBs or unsteadiness, did have brief lightheadedness on sitting that quickly passed.  O2 and HR stable t/o the effort.  Pt hoping to be able to go home on discharge and has husband (retired MD) that will be able to provide 24/7 assist.  Per today's performance it appears when she is ready for d/c this will be a safe and appropriate plan per continued trajectory.        Follow Up Recommendations Home health PT;Supervision for mobility/OOB    Equipment Recommendations  3in1 (PT)    Recommendations for Other Services       Precautions / Restrictions Precautions Precautions: Knee;Fall Precaution Booklet Issued: Yes (comment) Restrictions Weight Bearing Restrictions: Yes LLE Weight Bearing: Weight bearing as tolerated      Mobility  Bed Mobility Overal bed mobility: Needs Assistance Bed Mobility: Supine to Sit     Supine to sit: Min assist     General bed mobility comments: Pt with good effort in getting to sitting, did need assist to fully elevate torso, able to scoot to EOB with some effort but minimal additional phyiscal assist  Transfers Overall transfer level: Needs assistance Equipment used: Rolling walker (2 wheeled) Transfers: Sit to/from Stand Sit to Stand: Min guard         General transfer comment: Cuing for set up, foot/hand placement, sequencing.  She showed great effort and with heavy UE assist was  able to rise to standing with only CGA and VCs  Ambulation/Gait Ambulation/Gait assistance: Min guard Gait Distance (Feet): 5 Feet Assistive device: Rolling walker (2 wheeled)       General Gait Details: Pt was able to take a few side steps along EOB with no buckling and good confidence, able to then take forward/turning steps to get to recliner.   Stairs            Wheelchair Mobility    Modified Rankin (Stroke Patients Only)       Balance Overall balance assessment: Needs assistance   Sitting balance-Leahy Scale: Good Sitting balance - Comments: able to maintain sitting balance w/o assist     Standing balance-Leahy Scale: Good Standing balance comment: reliant on walker but able to maintain balance w/o issue                             Pertinent Vitals/Pain Pain Assessment: 0-10 Pain Score: 2  Pain Location: still with some numbness, pain very tolerable    Home Living Family/patient expects to be discharged to:: Private residence Living Arrangements: Spouse/significant other Available Help at Discharge: Family;Available 24 hours/day Type of Home: House Home Access: Stairs to enter Entrance Stairs-Rails: Right;Left(too wide to use both) Entrance Stairs-Number of Steps: 5 Home Layout: Able to live on main level with bedroom/bathroom Home Equipment: Walker - 2 wheels;Cane - single point      Prior Function Level  of Independence: Independent with assistive device(s)(has need RW since October 2/2 knee pain)         Comments: prior to October she had needed cane but was active and independent     Hand Dominance        Extremity/Trunk Assessment   Upper Extremity Assessment Upper Extremity Assessment: Overall WFL for tasks assessed    Lower Extremity Assessment Lower Extremity Assessment: LLE deficits/detail LLE Deficits / Details: Expected weakness post-op.  Unable to do SLRs, but otherwise 3+ to 4-/5 strength       Communication    Communication: No difficulties  Cognition Arousal/Alertness: Awake/alert Behavior During Therapy: WFL for tasks assessed/performed Overall Cognitive Status: Within Functional Limits for tasks assessed                                        General Comments      Exercises Total Joint Exercises Ankle Circles/Pumps: AROM;10 reps Quad Sets: Strengthening;10 reps Short Arc Quad: AROM;10 reps Heel Slides: AAROM;5 reps Hip ABduction/ADduction: AAROM;AROM;10 reps Straight Leg Raises: PROM;10 reps(unable to raise w/o assist) Knee Flexion: PROM;5 reps Goniometric ROM: 0-72   Assessment/Plan    PT Assessment Patient needs continued PT services  PT Problem List Decreased strength;Decreased range of motion;Decreased activity tolerance;Decreased balance;Decreased mobility;Decreased coordination;Decreased knowledge of use of DME;Decreased safety awareness;Pain       PT Treatment Interventions DME instruction;Gait training;Stair training;Functional mobility training;Therapeutic activities;Therapeutic exercise;Balance training;Neuromuscular re-education;Patient/family education    PT Goals (Current goals can be found in the Care Plan section)  Acute Rehab PT Goals Patient Stated Goal: go home  PT Goal Formulation: With patient Time For Goal Achievement: 06/06/19 Potential to Achieve Goals: Good    Frequency BID   Barriers to discharge        Co-evaluation               AM-PAC PT "6 Clicks" Mobility  Outcome Measure Help needed turning from your back to your side while in a flat bed without using bedrails?: A Little Help needed moving from lying on your back to sitting on the side of a flat bed without using bedrails?: A Little Help needed moving to and from a bed to a chair (including a wheelchair)?: A Little Help needed standing up from a chair using your arms (e.g., wheelchair or bedside chair)?: A Little Help needed to walk in hospital room?: A  Little Help needed climbing 3-5 steps with a railing? : A Lot 6 Click Score: 17    End of Session Equipment Utilized During Treatment: Gait belt Activity Tolerance: Patient limited by fatigue Patient left: with call bell/phone within reach;with chair alarm set   PT Visit Diagnosis: Muscle weakness (generalized) (M62.81);Difficulty in walking, not elsewhere classified (R26.2);Pain Pain - Right/Left: Left Pain - part of body: Knee    Time: 5597-4163 PT Time Calculation (min) (ACUTE ONLY): 35 min   Charges:   PT Evaluation $PT Eval Low Complexity: 1 Low PT Treatments $Therapeutic Exercise: 8-22 mins        Kreg Shropshire, DPT 05/23/2019, 6:12 PM

## 2019-05-23 NOTE — Anesthesia Post-op Follow-up Note (Signed)
Anesthesia QCDR form completed.        

## 2019-05-24 ENCOUNTER — Encounter: Payer: Self-pay | Admitting: Surgery

## 2019-05-24 LAB — GLUCOSE, CAPILLARY
Glucose-Capillary: 168 mg/dL — ABNORMAL HIGH (ref 70–99)
Glucose-Capillary: 178 mg/dL — ABNORMAL HIGH (ref 70–99)
Glucose-Capillary: 166 mg/dL — ABNORMAL HIGH (ref 70–99)
Glucose-Capillary: 76 mg/dL (ref 70–99)

## 2019-05-24 LAB — BASIC METABOLIC PANEL
Anion gap: 8 (ref 5–15)
BUN: 25 mg/dL — ABNORMAL HIGH (ref 8–23)
CO2: 22 mmol/L (ref 22–32)
Calcium: 7.9 mg/dL — ABNORMAL LOW (ref 8.9–10.3)
Chloride: 108 mmol/L (ref 98–111)
Creatinine, Ser: 1.42 mg/dL — ABNORMAL HIGH (ref 0.44–1.00)
GFR calc Af Amer: 39 mL/min — ABNORMAL LOW (ref >60)
GFR calc non Af Amer: 34 mL/min — ABNORMAL LOW (ref >60)
Glucose, Bld: 183 mg/dL — ABNORMAL HIGH (ref 70–99)
Potassium: 4.2 mmol/L (ref 3.5–5.1)
Sodium: 138 mmol/L (ref 135–145)

## 2019-05-24 LAB — CBC
HCT: 30 % — ABNORMAL LOW (ref 36.0–46.0)
Hemoglobin: 9.4 g/dL — ABNORMAL LOW (ref 12.0–15.0)
MCH: 27.2 pg (ref 26.0–34.0)
MCHC: 31.3 g/dL (ref 30.0–36.0)
MCV: 86.7 fL (ref 80.0–100.0)
Platelets: 169 10*3/uL (ref 150–400)
RBC: 3.46 MIL/uL — ABNORMAL LOW (ref 3.87–5.11)
RDW: 14.3 % (ref 11.5–15.5)
WBC: 8.5 10*3/uL (ref 4.0–10.5)
nRBC: 0 % (ref 0.0–0.2)

## 2019-05-24 MED ORDER — RIVAROXABAN 15 MG PO TABS
15.0000 mg | ORAL_TABLET | Freq: Every day | ORAL | Status: DC
  Administered 2019-05-25 – 2019-05-26 (×2): 15 mg via ORAL
  Filled 2019-05-24 (×2): qty 1

## 2019-05-24 NOTE — NC FL2 (Addendum)
[place] LEVEL OF CARE SCREENING TOOL     IDENTIFICATION  Patient Name: Tiffany [name] Birthdate: 1933/04/08 Sex: female Admission Date (Current Location): 05/23/2019  [place] and [place] Number:  Engineering geologist and Address:  [facility], [address], [place], [place] [address]      Provider Number: [id number]  Attending Physician Name and Address:  Corky Mull, MD  Relative Name and Phone Number:  Yannis Gumbs [phone]    Current Level of Care: Hospital Recommended Level of Care: [place] Prior Approval Number:    Date Approved/Denied: 05/24/19 PASRR Number: [id number]  Discharge Plan: SNF    Current Diagnoses: Patient Active Problem List   Diagnosis Date Noted  . Status post total knee replacement using cement, left 05/23/2019  . Chronic pain of left knee 02/08/2019  . Primary osteoarthritis of left knee 02/08/2019  . Derangement of lateral meniscus, left 02/08/2019  . Chronic pain syndrome 02/08/2019    Orientation RESPIRATION BLADDER Height & Weight     Self, Time, Situation, Place  Normal Continent Weight: 93 kg Height:  5\' 2"  (157.5 cm)  BEHAVIORAL SYMPTOMS/MOOD NEUROLOGICAL BOWEL NUTRITION STATUS      Continent Diet(regular)  AMBULATORY STATUS COMMUNICATION OF NEEDS Skin   Extensive Assist Verbally Normal, Surgical wounds                       Personal Care Assistance Level of Assistance  Bathing, Dressing Bathing Assistance: Limited assistance   Dressing Assistance: Limited assistance     Functional Limitations Info  Sight, Hearing, Speech Sight Info: Adequate Hearing Info: Adequate Speech Info: Adequate    SPECIAL CARE FACTORS FREQUENCY  PT (By licensed PT)     PT Frequency: 5 times a week              Contractures Contractures Info: Not present    Additional Factors Info  Code Status Code Status Info: full             Current Medications  (05/24/2019):  This is the current hospital active medication list Current Facility-Administered Medications  Medication Dose Route Frequency Provider Last Rate Last Dose  . 0.9 %  sodium chloride infusion   Intravenous Continuous Poggi, Marshall Cork, MD   Stopped at 05/24/19 1420  . acetaminophen (TYLENOL) tablet 1,000 mg  1,000 mg Oral Q6H Poggi, Marshall Cork, MD   1,000 mg at 05/24/19 1201  . acetaminophen (TYLENOL) tablet 325-650 mg  325-650 mg Oral Q6H PRN Poggi, Marshall Cork, MD      . albuterol (PROVENTIL) (2.5 MG/3ML) 0.083% nebulizer solution 2.5 mg  2.5 mg Inhalation Q4H PRN Poggi, Marshall Cork, MD      . bisacodyl (DULCOLAX) suppository 10 mg  10 mg Rectal Daily PRN Poggi, Marshall Cork, MD      . bupivacaine liposome (EXPAREL) 1.3 % injection 266 mg  20 mL Other Once Poggi, Marshall Cork, MD      . diphenhydrAMINE (BENADRYL) 12.5 MG/5ML elixir 12.5-25 mg  12.5-25 mg Oral Q4H PRN Poggi, Marshall Cork, MD      . docusate sodium (COLACE) capsule 100 mg  100 mg Oral BID Corky Mull, MD   100 mg at 05/24/19 0907  . flecainide (TAMBOCOR) tablet 100 mg  100 mg Oral BID Corky Mull, MD   100 mg at 05/24/19 1093  . glimepiride (AMARYL) tablet 2 mg  2 mg Oral Q breakfast Poggi, Marshall Cork, MD  2 mg at 05/24/19 0820  . HYDROmorphone (DILAUDID) injection 0.25-0.5 mg  0.25-0.5 mg Intravenous Q2H PRN Poggi, Marshall Cork, MD      . insulin aspart (novoLOG) injection 0-15 Units  0-15 Units Subcutaneous TID WC Poggi, Marshall Cork, MD   3 Units at 05/24/19 1201  . insulin aspart (novoLOG) injection 8 Units  8 Units Subcutaneous QPM Poggi, Marshall Cork, MD      . magnesium hydroxide (MILK OF MAGNESIA) suspension 30 mL  30 mL Oral Daily PRN Poggi, Marshall Cork, MD      . metoCLOPramide (REGLAN) tablet 5-10 mg  5-10 mg Oral Q8H PRN Poggi, Marshall Cork, MD       Or  . metoCLOPramide (REGLAN) injection 5-10 mg  5-10 mg Intravenous Q8H PRN Poggi, Marshall Cork, MD      . metoprolol succinate (TOPROL-XL) 24 hr tablet 25 mg  25 mg Oral Ellison Hughs, MD   25 mg at 05/24/19 4235  .  ondansetron (ZOFRAN) tablet 4 mg  4 mg Oral Q6H PRN Poggi, Marshall Cork, MD       Or  . ondansetron [facility]) injection 4 mg  4 mg Intravenous Q6H PRN Poggi, Marshall Cork, MD   4 mg at 05/24/19 0631  . oxyCODONE (Oxy IR/ROXICODONE) immediate release tablet 5-10 mg  5-10 mg Oral Q4H PRN Poggi, Marshall Cork, MD   5 mg at 05/24/19 1309  . polyethylene glycol (MIRALAX / GLYCOLAX) packet 17 g  17 g Oral Daily PRN Poggi, Marshall Cork, MD      . Derrill Memo ON 05/25/2019] Rivaroxaban (XARELTO) tablet 15 mg  15 mg Oral Q breakfast Poggi, Marshall Cork, MD      . sodium phosphate (FLEET) 7-19 GM/118ML enema 1 enema  1 enema Rectal Once PRN Poggi, Marshall Cork, MD      . torsemide [facility]) tablet 10 mg  10 mg Oral Daily Poggi, Marshall Cork, MD   10 mg at 05/24/19 3614  . traMADol (ULTRAM) tablet 50 mg  50 mg Oral Q6H PRN Poggi, Marshall Cork, MD         Discharge Medications: Please see discharge summary for a list of discharge medications.  Relevant Imaging Results:  Relevant Lab Results:   Additional Information [id number]  Su Hilt, RN

## 2019-05-24 NOTE — Progress Notes (Signed)
PHARMACY NOTE:   RENAL DOSAGE ADJUSTMENT  As per policy approved by the Pharmacy & Therapeutics and Medical Executive Committees, the anticoagulant dosage will be adjusted accordingly.  Current anticoagulant dosage:  Xarelto 20 mg po Daily  Indication: Afib  Renal Function:  Estimated Creatinine Clearance: 31.9 mL/min ([name]) (by C-G formula based on SCr of 1.42 mg/dL (H)).  *For Xarelto use Total Body Weight to calculate Crcl: Crcl= 44 ml/min with TBW (93 kg)     Dosage has been changed to:  Xarelto 15 mg PO daily  Additional comments:  Thank you for allowing pharmacy to be [name] part of this patient's care.  Tiffany [name], [facility] 05/24/2019 9:43 AM

## 2019-05-24 NOTE — TOC Initial Note (Signed)
Transition of Care [facility]) - Initial/Assessment Note    Patient Details  Name: Tiffany [name] MRN: [id number] Date of Birth: [birth year]  Transition of Care [facility]) CM/SW Contact:    Su Hilt, RN Phone Number: 05/24/2019, 2:39 PM  Clinical Narrative:                 Met with the patient to discuss DC plana and needs, she stated that she is not very comfortable deciding right now, she feels that she needs a few days to decide what she wants to do.  I explained to her that unless there is a medical reason that we are not allowed to keep patient for several days for  A total knee surgery.,  I explained that usually the max for a knee is 2 days and that there has to be a medical reason to stay longer.  She gave permission to start the bed request and search, I explained that this does not commit her to a choice.  She agreed to review the bed offers once obtained, FL2 and PASSR completed, Bed search sent  Expected Discharge Plan: [place] Barriers to Discharge: Continued Medical Work up   Patient Goals and CMS Choice Patient states their goals for this hospitalization and ongoing recovery are:: go home      Expected Discharge Plan and Services Expected Discharge Plan: [place]   Discharge Planning Services: CM Consult   Living arrangements for the past 2 months: Single Family Home                                      Prior Living Arrangements/Services Living arrangements for the past 2 months: Single Family Home Lives with:: Spouse Patient language and need for interpreter reviewed:: No Do you feel safe going back to the place where you live?: Yes      Need for Family Participation in Patient Care: Yes (Comment) Care giver support system in place?: No (comment)   Criminal Activity/Legal Involvement Pertinent to Current Situation/Hospitalization: No - Comment as needed  Activities of Daily Living Home Assistive Devices/Equipment:  Walker (specify type) ADL Screening (condition at time of admission) Patient's cognitive ability adequate to safely complete daily activities?: Yes Is the patient deaf or have difficulty hearing?: No Does the patient have difficulty seeing, even when wearing glasses/contacts?: No Does the patient have difficulty concentrating, remembering, or making decisions?: Yes Patient able to express need for assistance with ADLs?: Yes Does the patient have difficulty dressing or bathing?: Yes Independently performs ADLs?: No Does the patient have difficulty walking or climbing stairs?: Yes Weakness of Legs: Left Weakness of Arms/Hands: None  Permission Sought/Granted Permission sought to share information with : Case Manager Permission granted to share information with : Yes, Verbal Permission Granted     Permission granted to share info w AGENCY: Any SNF        Emotional Assessment Appearance:: Appears stated age Attitude/Demeanor/Rapport: Engaged Affect (typically observed): Accepting Orientation: : Oriented to Self, Oriented to Place, Oriented to  Time, Oriented to Situation Alcohol / Substance Use: Never Used Psych Involvement: No (comment)  Admission diagnosis:  PRIMARY OSTEOARTHRITIS OF LEFT KNEE Patient Active Problem List   Diagnosis Date Noted  . Status post total knee replacement using cement, left 05/23/2019  . Chronic pain of left knee 02/08/2019  . Primary osteoarthritis of left knee 02/08/2019  . Derangement of lateral  meniscus, left 02/08/2019  . Chronic pain syndrome 02/08/2019   PCP:  Adin Hector, MD Pharmacy:   [facility] [place], [place] [address] [place] [address] Phone: [phone] Fax: [phone]     Social Determinants of Health (SDOH) Interventions    Readmission Risk Interventions No flowsheet data found.

## 2019-05-24 NOTE — Progress Notes (Addendum)
Physical Therapy Treatment Patient Details Name: Tiffany [name] MRN: [id number] DOB: April 05, 1933 Today's Date: 05/24/2019    History of Present Illness 85 y/o female s/p L TKA 05/23/19.    PT Comments    Patient agreeable to PT, reported 6/10 L knee pain at rest. RN informed and in room to administer pain medication to maximize pt participation. Therapeutic exercises performed, AAROM on LLE due increased pain for all exercises. Supine to sit with minA to manage LLE, reported some dizziness EOB that improved with pain, exhibited fair sitting balance. Sit <> stand x2 this session, first attempt minAx2 with RW, pt requesting to sit and rest due to increased pain. Second attempt with RW and CGAx2, stand pivot transfer to chair, minA to control descent to chair. Pt in chair with PA in room, in no acute distress. This session very limited by pt pain, but PT will re-assess this PM, and pending progress discharge plans may need to be updated for now HHPT with superivision for mobility/OOB    Follow Up Recommendations  Home health PT;Supervision for mobility/OOB     Equipment Recommendations  3in1 (PT)    Recommendations for Other Services OT consult     Precautions / Restrictions Precautions Precautions: Knee;Fall Restrictions Weight Bearing Restrictions: Yes LLE Weight Bearing: Weight bearing as tolerated    Mobility  Bed Mobility Overal bed mobility: Needs Assistance Bed Mobility: Supine to Sit     Supine to sit: Min assist     General bed mobility comments: Assist for LLE, HOB elevated, use of bed rails  Transfers Overall transfer level: Needs assistance Equipment used: Rolling walker (2 wheeled) Transfers: Sit to/from Stand Sit to Stand: Min assist;Min guard;+2 safety/equipment         General transfer comment: first attempt with 2 assist, minAx2. Second attempt CGAx2. Pt very limited by reported L knee pain  Ambulation/Gait Ambulation/Gait assistance: Min guard;+2  safety/equipment Gait Distance (Feet): 2 Feet Assistive device: Rolling walker (2 wheeled)       General Gait Details: Step to gait pattern, extensive verbal cues and use of UE support. Pt with significantly decreased weight bearing through LLE. Constant motivation needed to maximize pt participation   Stairs             Wheelchair Mobility    Modified Rankin (Stroke Patients Only)       Balance Overall balance assessment: Needs assistance Sitting-balance support: Feet supported Sitting balance-Leahy Scale: Fair       Standing balance-Leahy Scale: Poor Standing balance comment: reliant on RW for balance, and pt improved performance with 2 person assist                            Cognition Arousal/Alertness: Awake/alert Behavior During Therapy: WFL for tasks assessed/performed Overall Cognitive Status: Within Functional Limits for tasks assessed                                        Exercises Total Joint Exercises Ankle Circles/Pumps: AROM;10 reps Quad Sets: Strengthening;10 reps Gluteal Sets: AROM;Both;15 reps Heel Slides: AAROM;15 reps;Left Hip ABduction/ADduction: AAROM;Left;15 reps Long Arc Quad: AAROM;Strengthening;Left;10 reps    General Comments        Pertinent Vitals/Pain Pain Assessment: 0-10 Pain Score: 6  Pain Location: Anterior L knee, pt very limited by pain this session Pain Descriptors / Indicators: Moaning;Guarding;Grimacing Pain Intervention(s):  Limited activity within patient's tolerance;Monitored during session;Repositioned;RN gave pain meds during session;Ice applied    Home Living                      Prior Function            PT Goals (current goals can now be found in the care plan section) Progress towards PT goals: Not progressing toward goals - comment(Pt limited by pain this AM)    Frequency    BID      PT Plan Current plan remains appropriate    Co-evaluation               AM-PAC PT "6 Clicks" Mobility   Outcome Measure  Help needed turning from your back to your side while in a flat bed without using bedrails?: A Little Help needed moving from lying on your back to sitting on the side of a flat bed without using bedrails?: A Little Help needed moving to and from a bed to a chair (including a wheelchair)?: A Little Help needed standing up from a chair using your arms (e.g., wheelchair or bedside chair)?: A Little Help needed to walk in hospital room?: A Lot   6 Click Score: 14    End of Session Equipment Utilized During Treatment: Gait belt Activity Tolerance: Patient limited by fatigue;Patient limited by pain Patient left: with call bell/phone within reach;with chair alarm set;Other (comment)(PA in room) Nurse Communication: Mobility status PT Visit Diagnosis: Muscle weakness (generalized) (M62.81);Difficulty in walking, not elsewhere classified (R26.2);Pain Pain - Right/Left: Left Pain - part of body: Knee     Time: 5697-9480 PT Time Calculation (min) (ACUTE ONLY): 38 min  Charges:  $Therapeutic Exercise: 23-37 mins $Therapeutic Activity: 8-22 mins                    Lieutenant Diego PT, DPT 9:47 AM,05/24/19 [phone]

## 2019-05-24 NOTE — Progress Notes (Signed)
  Subjective: 1 Day Post-Op Procedure(s) (LRB): TOTAL KNEE ARTHROPLASTY - LEFT - DIABETIC (Left) Patient reports pain as moderate.   Patient is well, and has had no acute complaints or problems Plan is to go Home after hospital stay. Negative for chest pain and shortness of breath Fever: no Gastrointestinal:Negative for nausea and vomiting  Objective: Vital signs in last 24 hours: Temp:  [97 F (36.1 C)-98.8 F (37.1 C)] 98.8 F (37.1 C) (05/27 0307) Pulse Rate:  [64-85] 85 (05/27 0307) Resp:  [11-25] 16 (05/27 0307) BP: (108-171)/(61-97) 148/61 (05/27 0307) SpO2:  [93 %-100 %] 100 % (05/27 0307)  Intake/Output from previous day:  Intake/Output Summary (Last 24 hours) at 05/24/2019 0938 Last data filed at 05/24/2019 0500 Gross per 24 hour  Intake 3717.06 ml  Output 1400 ml  Net 2317.06 ml    Intake/Output this shift: No intake/output data recorded.  Labs: No results for input(s): HGB in the last 72 hours. No results for input(s): WBC, RBC, HCT, PLT in the last 72 hours. Recent Labs    05/24/19 0436  NA 138  K 4.2  CL 108  CO2 22  BUN 25*  CREATININE 1.42*  GLUCOSE 183*  CALCIUM 7.9*   No results for input(s): LABPT, INR in the last 72 hours.   EXAM General - Patient is Alert, Appropriate and Oriented Extremity - ABD soft Sensation intact distally Intact pulses distally Dorsiflexion/Plantar flexion intact Incision: dressing C/D/I No cellulitis present Dressing/Incision - clean, dry, no drainage Motor Function - intact, moving foot and toes well on exam.  Abdomen is soft with normal BS. Negative Homan's to the left leg.  Past Medical History:  Diagnosis Date  . A-fib ([place])   . Anemia   . Arthritis   . Cataracts, both eyes   . Chickenpox   . Chronic kidney disease   . Complication of anesthesia   . Diabetes mellitus without complication ([place])   . Dyspnea    with exertion  . Dysrhythmia   . Hyperlipidemia   . Hypertension   . PONV  (postoperative nausea and vomiting)    with 1st pregnancy 50 years ago and no problem since then  . Right leg weakness   . Spinal stenosis   . UTI (urinary tract infection)     Assessment/Plan: 1 Day Post-Op Procedure(s) (LRB): TOTAL KNEE ARTHROPLASTY - LEFT - DIABETIC (Left) Active Problems:   Status post total knee replacement using cement, left  Estimated body mass index is 37.49 kg/m as calculated from the following:   Height as of this encounter: 5\' 2"  (1.575 m).   Weight as of this encounter: 93 kg. Advance diet Up with therapy D/C IV fluids when tolerating po intake.  Labs reviewed, CBC ordered for today and tomorrow morning. Up with therapy today. Reporting moderate pain to the left knee, will adjust tramadol prescription. Begin working on BM.  DVT Prophylaxis - Foot Pumps, TED hose and Xarelto Weight-Bearing as tolerated to left leg  J. Cameron Proud, PA-C [facility] Orthopaedic Surgery 05/24/2019, 9:38 AM

## 2019-05-24 NOTE — TOC Progression Note (Addendum)
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  I explained that if [facility] agreed with the discharge then the patient would be financially responsible for the hospital bill form anytime past the DC day.  I explained that is why PT recommended rehab so she would have more care than she would at home.  He stated that he called [facility] and spoke to [place], she told him that she would offer a bed.  I explained that I did send the request to her and that the bed offer has not been made yet.  He asked why I did not call and talk with him, I explained that the patient was alert and oriented and was able to have the conversation with me with no difficulty and that as long as the patient has the capacity then we talk with them, I also explained that the patient told me that she was going to talk to him about it and no decisions have been made to this point.  He stated "good ole [employer]" he thanked me for the information and said he would talk to me tomorrow  Expected  Discharge Plan: [place] Barriers to Discharge: Continued Medical Work up  Expected Discharge Plan and Services Expected Discharge Plan: [place]   Discharge Planning Services: CM Consult   Living arrangements for the past 2 months: Single Family Home                                       Social Determinants of Health (SDOH) Interventions    Readmission Risk Interventions No flowsheet data found.

## 2019-05-24 NOTE — Evaluation (Signed)
Occupational Therapy Evaluation Patient Details Name: Tiffany [name] MRN: [id number] DOB: 21-Jun-1933 Today's Date: 05/24/2019    History of Present Illness 85 y/o female s/p L TKA 05/23/19.   Clinical Impression   Tiffany [name] was seen for OT evaluation this date, POD#1 from above surgery. Pt was independent in all ADLs prior to October, 2019 when increased pain and decreased ROM in her LLE began to impact her ability to engage in daily occupations. Pt currently using a 2WW in the home for mobility due to L knee pain. Pt lives with her husband in a multi-level home with 5 steps to enter. Pt endorses her husband assists with all ADL/IADL tasks because she "lives in my recliner" due to significant knee pain. Pt is eager to return to PLOF with less pain and improved safety and independence, however, she declined any OOB/EOB activity 2/2 increased pain with movement of the LUE. Pt educated on importance of early mobilization after surgery. Pt currently requires mod assist for LB dressing while in seated position due to pain and limited AROM of L knee. Pt instructed in polar care mgt, falls prevention strategies, home/routines modifications, DME/AE for LB bathing and dressing tasks, and compression stocking mgt. Pt would benefit from skilled OT services including additional instruction in dressing techniques with or without assistive devices for dressing and bathing skills to support recall and carryover prior to discharge and ultimately to maximize safety, independence, and minimize falls risk and caregiver burden. Recommend STR upon hospital DC to maximize pt safety and return to PLOF.    Follow Up Recommendations  SNF    Equipment Recommendations  (TBD)    Recommendations for Other Services       Precautions / Restrictions Precautions Precautions: Knee;Fall Precaution Booklet Issued: No Restrictions Weight Bearing Restrictions: Yes LLE Weight Bearing: Weight bearing as tolerated       Mobility Bed Mobility Overal bed mobility: Needs Assistance             General bed mobility comments: deferred pt refusing at this time due to increased pain  Transfers Overall transfer level: Needs assistance               General transfer comment: deferred due to pain this PM    Balance                                           ADL either performed or assessed with clinical judgement   ADL Overall ADL's : Needs assistance/impaired                                       General ADL Comments: Pt req mod assist for LB ADLs in seated position due to pain and limited ROM of the L knee. Currently requires set-up assist for grooming, self-feeding, etc. due to limited functional mobility.      Vision         Perception     Praxis      Pertinent Vitals/Pain Pain Assessment: Faces Pain Score: 6  Faces Pain Scale: Hurts even more Pain Location: L knee  Pain Descriptors / Indicators: Moaning;Guarding;Grimacing;Discomfort Pain Intervention(s): Limited activity within patient's tolerance;Monitored during session;Utilized relaxation techniques;Premedicated before session     Hand Dominance     Extremity/Trunk Assessment Upper Extremity Assessment Upper  Extremity Assessment: Overall WFL for tasks assessed   Lower Extremity Assessment Lower Extremity Assessment: LLE deficits/detail;Defer to PT evaluation LLE Deficits / Details: Expected weakness post-op.  LLE: Unable to fully assess due to pain       Communication Communication Communication: No difficulties   Cognition Arousal/Alertness: Lethargic;Suspect due to medications Behavior During Therapy: [facility] for tasks assessed/performed Overall Cognitive Status: Within Functional Limits for tasks assessed                                 General Comments: Pt declined OOB 2/2 pain. At times would begin to close eyes/drift to sleep during conversation but easily  roused. Suspect due to medications.    General Comments       Exercises  Other Exercises: Pt educated in polar care mgt, cognitive behavioral pain mgt including deep breathing and distraction techniques, falls prevention strategies, safe use of AE for LB ADLs, compression stocking mgt and routines modification to promote safety and improved functional return upon hospital DC. Pt verbalized understanding of information provided but would benefit from additional reinforcement of education and practice with AE once pain is more controlled and pt agreeable to mobilize.   Shoulder Instructions      Home Living Family/patient expects to be discharged to:: Private residence Living Arrangements: Spouse/significant other Available Help at Discharge: Family;Available 24 hours/day Type of Home: House Home Access: Stairs to enter [employer] of Steps: 5 Entrance Stairs-Rails: Right;Left(Could not reach both. ) Home Layout: Able to live on main level with bedroom/bathroom;Multi-level Alternate Level Stairs-Number of Steps: flight to basement.    Bathroom Shower/Tub: Walk-in shower(on first floor. )   Bathroom Toilet: Standard     Home Equipment: Environmental consultant - 2 wheels;Cane - single point;Shower seat          Prior Functioning/Environment Level of Independence: Independent with assistive device(s)(Has used RW since October 2/2 to L Knee pain. )        Comments: prior to October she had needed cane but was active and independent. Since October pt states, "I live in my recliner". Husband assists with ADLs/IADLs. Pt would like to get back to more active/indep life.         OT Problem List: Decreased strength;Impaired balance (sitting and/or standing);Pain;Decreased range of motion;Decreased safety awareness;Decreased activity tolerance;Decreased coordination;Decreased knowledge of use of DME or AE      OT Treatment/Interventions: Self-care/ADL training;Therapeutic exercise;Balance  training;Patient/family education;Therapeutic activities;DME and/or AE instruction    OT Goals(Current goals can be found in the care plan section) Acute Rehab OT Goals Patient Stated Goal: go home  OT Goal Formulation: With patient Time For Goal Achievement: 06/07/19 Potential to Achieve Goals: Good ADL Goals Pt Will Perform Lower Body Bathing: with set-up;sit to/from stand;with adaptive equipment(With LRAD PRN for safety and improved functional independence.) Pt Will Perform Lower Body Dressing: with adaptive equipment;sit to/from stand;with min assist(With LRAD PRN for safety and improved functional independence.) Pt Will Transfer to Toilet: bedside commode;ambulating;with min guard assist(With LRAD PRN for safety and improved functional independence.)  OT Frequency: Min 1X/week   Barriers to D/C: Inaccessible home environment  Pt with 5 steps to enter the home.        Co-evaluation              AM-PAC OT "6 Clicks" Daily Activity     Outcome Measure Help from another person eating meals?: None Help from another person taking care  of personal grooming?: A Little Help from another person toileting, which includes using toliet, bedpan, or urinal?: A Lot Help from another person bathing (including washing, rinsing, drying)?: A Lot Help from another person to put on and taking off regular upper body clothing?: A Little Help from another person to put on and taking off regular lower body clothing?: A Lot 6 Click Score: 16   End of Session    Activity Tolerance: Patient limited by fatigue;Patient limited by pain Patient left: in bed;with call bell/phone within reach;with bed alarm set;with SCD's reapplied;Other (comment)(With polar care in place. )  OT Visit Diagnosis: Other abnormalities of gait and mobility (R26.89);Pain Pain - Right/Left: Left Pain - part of body: Knee                Time: 6950-7225 OT Time Calculation (min): 31 min Charges:  OT General Charges $OT  Visit: 1 Visit OT Evaluation $OT Eval Low Complexity: 1 Low OT Treatments $Self Care/Home Management : 23-37 mins  Shara Blazing, M.S., OTR/L Ascom: [phone] 05/24/19, 4:22 PM

## 2019-05-24 NOTE — Progress Notes (Signed)
Inpatient Diabetes Program Recommendations  AACE/ADA: New Consensus Statement on Inpatient Glycemic Control (2015)  Target Ranges:  Prepandial:   less than 140 mg/dL      Peak postprandial:   less than 180 mg/dL (1-2 hours)      Critically ill patients:  140 - 180 mg/dL   Lab Results  Component Value Date   GLUCAP 178 (H) 05/24/2019    Review of Glycemic Control Results for Tiffany, [name] [facility] (MRN [id number]) as of 05/24/2019 13:18  Ref. Range 05/23/2019 08:57 05/23/2019 12:59 05/23/2019 18:09 05/24/2019 08:10 05/24/2019 11:52  Glucose-Capillary Latest Ref Range: 70 - 99 mg/dL 149 (H) 121 (H) 185 (H) 168 (H) 178 (H)   Diabetes history: DM 2 Outpatient Diabetes medications:  Amaryl 2 mg daily, Novolog 8 units with PM dose, Toujeo 5-20 units every evening Current orders for Inpatient glycemic control:  Novolog moderate tid with meals Amaryl 2 mg daily Novolog 8 units with supper (1800) Inpatient Diabetes Program Recommendations:     Please d/c PM dose of Novolog 8 units with dinner.   Thanks  Adah Perl, RN, BC-ADM Inpatient Diabetes Coordinator Pager [phone] (8a-5p)

## 2019-05-24 NOTE — Progress Notes (Signed)
Physical Therapy Treatment Patient Details Name: Tiffany [name] MRN: [id number] DOB: October 14, 1933 Today's Date: 05/24/2019    History of Present Illness 85 y/o female s/p L TKA 05/23/19.    PT Comments    RN and PT coordinated medication prior to PT session. Pt initially willing to perform some bed level exercises with encouragement. Had returned to bed with nursing recently. AAROM for most exercises along with constant verbal cues to maximize pt participation. Pt exhibited signs/symptoms of pain and requesting PT to be done for the day. Despite encouragement, active listening and education pt continued to defer working further with PT due to elevated L knee pain. Pt discharge plans updated to STR due to current limitations and assistance needed (per this AM session) pending pt progress. RN informed of pt status and pain.    Follow Up Recommendations  SNF     Equipment Recommendations  3in1 (PT)    Recommendations for Other Services OT consult     Precautions / Restrictions Precautions Precautions: Knee;Fall Restrictions Weight Bearing Restrictions: Yes LLE Weight Bearing: Weight bearing as tolerated    Mobility  Bed Mobility               General bed mobility comments: deferred pt refusing at this time due to increased pain  Transfers                 General transfer comment: deferred due to pain this PM  Ambulation/Gait                 Stairs             Wheelchair Mobility    Modified Rankin (Stroke Patients Only)       Balance                                            Cognition Arousal/Alertness: Awake/alert Behavior During Therapy: WFL for tasks assessed/performed Overall Cognitive Status: Within Functional Limits for tasks assessed                                        Exercises Total Joint Exercises Ankle Circles/Pumps: AROM;15 reps Quad Sets: Strengthening;10 reps Gluteal Sets:  AROM;Both;15 reps Hip ABduction/ADduction: AAROM;Left;5 reps Goniometric ROM: 0, pt refusing to bend knee or sit on side of bed to evaluate knee flexion    General Comments        Pertinent Vitals/Pain Pain Assessment: Faces Faces Pain Scale: Hurts even more Pain Location: Anterior L knee, pt very limited by pain this session Pain Descriptors / Indicators: Moaning;Guarding;Grimacing;Discomfort Pain Intervention(s): Limited activity within patient's tolerance;Monitored during session;Repositioned;Patient requesting pain meds-RN notified;Ice applied    Home Living                      Prior Function            PT Goals (current goals can now be found in the care plan section) Progress towards PT goals: Not progressing toward goals - comment    Frequency    BID      PT Plan Discharge plan needs to be updated    Co-evaluation              AM-PAC PT "6 Clicks" Mobility   Outcome Measure  Help needed turning from your back to your side while in a flat bed without using bedrails?: A Little Help needed moving from lying on your back to sitting on the side of a flat bed without using bedrails?: A Little Help needed moving to and from a bed to a chair (including a wheelchair)?: A Lot Help needed standing up from a chair using your arms (e.g., wheelchair or bedside chair)?: A Lot Help needed to walk in hospital room?: Total Help needed climbing 3-5 steps with a railing? : Total 6 Click Score: 12    End of Session Equipment Utilized During Treatment: Gait belt Activity Tolerance: Patient limited by pain Patient left: with chair alarm set;Other (comment);in bed;with bed alarm set;with SCD's reapplied Nurse Communication: Mobility status PT Visit Diagnosis: Muscle weakness (generalized) (M62.81);Difficulty in walking, not elsewhere classified (R26.2);Pain Pain - Right/Left: Left Pain - part of body: Knee     Time: 1341-1409 PT Time Calculation (min) (ACUTE  ONLY): 28 min  Charges:  $Therapeutic Exercise: 23-37 mins                     Lieutenant Diego PT, DPT [phone] PM,05/24/19 [phone]

## 2019-05-24 NOTE — TOC Progression Note (Addendum)
Transition of Care [facility]) - Progression Note    Patient Details  Name: Tiffany [name] MRN: [id number] Date of Birth: October 30, 1933  Transition of Care [facility]) CM/SW Contact  Su Hilt, RN Phone Number: 05/24/2019, 4:41 PM  Clinical Narrative:     Magda Paganini with [facility] offered a bed for rehab, I called and spoke to [place] the patient's husband and let him know that The bed offer he anticipated has been made, I asked if he was ok if I started the insurance auth request, he stated yes. I explained I would also be speaking with the patient to get her approval, I went into the room and spoke with the patient I explained I had talked to her husband and that I made him aware of the bed offer and he agrees, I also made her aware that I had to request insurance auth and needed her permission, she agreed to go ahead and start the process and was pleased that they had a bed to offer.  I explained that her husband would be calling her.  I  Called [employer] oncall phone and started the auth process with the anticipated DC for tomorrow I spoke to [place] at  [facility].   Expected Discharge Plan: [place] Barriers to Discharge: Continued Medical Work up  Expected Discharge Plan and Services Expected Discharge Plan: [place]   Discharge Planning Services: CM Consult   Living arrangements for the past 2 months: Single Family Home                                       Social Determinants of Health (SDOH) Interventions    Readmission Risk Interventions No flowsheet data found.

## 2019-05-25 LAB — CBC
HCT: 31.8 % — ABNORMAL LOW (ref 36.0–46.0)
Hemoglobin: 10.2 g/dL — ABNORMAL LOW (ref 12.0–15.0)
MCH: 27.3 pg (ref 26.0–34.0)
MCHC: 32.1 g/dL (ref 30.0–36.0)
MCV: 85.3 fL (ref 80.0–100.0)
Platelets: 177 10*3/uL (ref 150–400)
RBC: 3.73 MIL/uL — ABNORMAL LOW (ref 3.87–5.11)
RDW: 14.1 % (ref 11.5–15.5)
WBC: 13.1 10*3/uL — ABNORMAL HIGH (ref 4.0–10.5)
nRBC: 0 % (ref 0.0–0.2)

## 2019-05-25 LAB — URINALYSIS, COMPLETE (UACMP) WITH MICROSCOPIC
Bilirubin Urine: NEGATIVE
Glucose, UA: NEGATIVE mg/dL
Ketones, ur: 5 mg/dL — AB
Nitrite: NEGATIVE
Protein, ur: NEGATIVE mg/dL
Specific Gravity, Urine: 1.015 (ref 1.005–1.030)
pH: 5 (ref 5.0–8.0)

## 2019-05-25 LAB — GLUCOSE, CAPILLARY
Glucose-Capillary: 175 mg/dL — ABNORMAL HIGH (ref 70–99)
Glucose-Capillary: 172 mg/dL — ABNORMAL HIGH (ref 70–99)
Glucose-Capillary: 278 mg/dL — ABNORMAL HIGH (ref 70–99)
Glucose-Capillary: 194 mg/dL — ABNORMAL HIGH (ref 70–99)
Glucose-Capillary: 188 mg/dL — ABNORMAL HIGH (ref 70–99)

## 2019-05-25 LAB — BASIC METABOLIC PANEL
Anion gap: 11 (ref 5–15)
BUN: 22 mg/dL (ref 8–23)
CO2: 22 mmol/L (ref 22–32)
Calcium: 8.8 mg/dL — ABNORMAL LOW (ref 8.9–10.3)
Chloride: 104 mmol/L (ref 98–111)
Creatinine, Ser: 1.44 mg/dL — ABNORMAL HIGH (ref 0.44–1.00)
GFR calc Af Amer: 39 mL/min — ABNORMAL LOW (ref >60)
GFR calc non Af Amer: 33 mL/min — ABNORMAL LOW (ref >60)
Glucose, Bld: 185 mg/dL — ABNORMAL HIGH (ref 70–99)
Potassium: 4 mmol/L (ref 3.5–5.1)
Sodium: 137 mmol/L (ref 135–145)

## 2019-05-25 MED ORDER — NITROFURANTOIN MONOHYD MACRO 100 MG PO CAPS: 100.0000 mg | ORAL_CAPSULE | Freq: Two times a day (BID) | ORAL | 0 refills | Status: DC

## 2019-05-25 MED ORDER — HYDROCODONE-ACETAMINOPHEN 5-325 MG PO TABS: 1.0000 | ORAL_TABLET | ORAL | Status: DC | PRN

## 2019-05-25 MED ORDER — HYDROCODONE-ACETAMINOPHEN 5-325 MG PO TABS: 1.0000 | ORAL_TABLET | ORAL | 0 refills | Status: DC | PRN

## 2019-05-25 MED ORDER — TRAMADOL HCL 50 MG PO TABS: 50.0000 mg | ORAL_TABLET | Freq: Four times a day (QID) | ORAL | 0 refills | Status: DC | PRN

## 2019-05-25 MED ORDER — INSULIN GLARGINE 100 UNIT/ML ~~LOC~~ SOLN
8.0000 [IU] | Freq: Every evening | SUBCUTANEOUS | Status: DC
  Administered 2019-05-25: 8 [IU] via SUBCUTANEOUS
  Filled 2019-05-25 (×2): qty 0.08

## 2019-05-25 MED ORDER — TRAMADOL HCL 50 MG PO TABS
50.0000 mg | ORAL_TABLET | Freq: Four times a day (QID) | ORAL | Status: DC | PRN
  Administered 2019-05-25 – 2019-05-26 (×2): 100 mg via ORAL
  Filled 2019-05-25 (×2): qty 2

## 2019-05-25 MED ORDER — NITROFURANTOIN MONOHYD MACRO 100 MG PO CAPS
100.0000 mg | ORAL_CAPSULE | Freq: Two times a day (BID) | ORAL | Status: DC
  Administered 2019-05-25 – 2019-05-26 (×3): 100 mg via ORAL
  Filled 2019-05-25 (×4): qty 1

## 2019-05-25 NOTE — TOC Progression Note (Signed)
Transition of Care [facility]) - Progression Note    Patient Details  Name: Shamaine Mulkern MRN: [id number] Date of Birth: 1933-10-21  Transition of Care [facility]) CM/SW [place], RN Phone Number: 05/25/2019, 10:15 AM  Clinical Narrative:    Jari Pigg from [employer] called and stated that the auth was approved for 7 days auth number [id number], I let her know anticipate DC tomorrow, notified the patient that her Josem Kaufmann was approved and she has asked that I call her husband to let him know, I called her husband Mikki Santee and notified him that it was approved by insurance and that we have accepted the bed at [facility] for the patient, he is in agreeance.    Expected Discharge Plan: [place] Barriers to Discharge: Continued Medical Work up  Expected Discharge Plan and Services Expected Discharge Plan: [place]   Discharge Planning Services: CM Consult   Living arrangements for the past 2 months: Single Family Home                                       Social Determinants of Health (SDOH) Interventions    Readmission Risk Interventions No flowsheet data found.

## 2019-05-25 NOTE — Progress Notes (Signed)
Physical Therapy Treatment Patient Details Name: Tiffany [name] MRN: [id number] DOB: 1933-04-09 Today's Date: 05/25/2019    History of Present Illness 85 y/o female s/p L TKA 05/23/19.    PT Comments    Patient with significant difficulty tolerating WBing through L LE for formal mobility training (due to pain, fear); somewhat self-limiting despite max cuing/encouragement/instruction from therapist. Intermittent confusion evident throughout session; RN aware.     Follow Up Recommendations  SNF     Equipment Recommendations       Recommendations for Other Services       Precautions / Restrictions Precautions Precautions: Knee;Fall Restrictions Weight Bearing Restrictions: Yes LLE Weight Bearing: Weight bearing as tolerated    Mobility  Bed Mobility               General bed mobility comments: on BSC upon arrival to session; in recliner end of session  Transfers Overall transfer level: Needs assistance Equipment used: Rolling walker (2 wheeled) Transfers: Sit to/from Stand Sit to Stand: Min assist;Mod assist         General transfer comment: constant cuing for hand placement and mechanics, sequencing; limited carry-over between repetitions.  Often choosing to pull on RW with ascent, 'hanging' on RW with descent  Ambulation/Gait   Gait Distance (Feet): (3-4 steps) Assistive device: Rolling walker (2 wheeled)       General Gait Details: patient able to take 3-4 steps between surfaces for BSC/bed and bed/recliner transfer; significant cuing for sequencing.  Physical assist to advance bilat LEs, refusing further distances and initiating spontaneous sit with continued efforts.   Stairs             Wheelchair Mobility    Modified Rankin (Stroke Patients Only)       Balance Overall balance assessment: Needs assistance Sitting-balance support: No upper extremity supported;Feet supported Sitting balance-Leahy Scale: Fair     Standing balance  support: Bilateral upper extremity supported Standing balance-Leahy Scale: Poor                              Cognition Arousal/Alertness: Awake/alert Behavior During Therapy: WFL for tasks assessed/performed Overall Cognitive Status: Within Functional Limits for tasks assessed                                 General Comments: intermittent confusion (medication/UTI related?)      Exercises Total Joint Exercises Goniometric ROM: L knee: 0-75 degrees, act assist Other Exercises Other Exercises: Sit/stand x7-8 reps from various surfaces (bed/BSC/recliner) wiht RW, min/mod assist +1.  Poor carry-over and willingness to comply with instruction for hand placement; limited active use of L LE Other Exercises: Multiple attempts at gait trials (beyond transfers between surfaces), min/mod assist for weight shift.  Patient unable/unwilling to accept weight in L LE for completion of any formal steps/gait training.    General Comments        Pertinent Vitals/Pain Pain Assessment: Faces Faces Pain Scale: Hurts even more Pain Location: L knee  Pain Descriptors / Indicators: Moaning;Guarding;Grimacing;Discomfort Pain Intervention(s): Limited activity within patient's tolerance;Monitored during session;Repositioned;Patient requesting pain meds-RN notified    Home Living                      Prior Function            PT Goals (current goals can now be found in the  care plan section) Acute Rehab PT Goals Patient Stated Goal: go home  PT Goal Formulation: With patient Time For Goal Achievement: 06/06/19 Potential to Achieve Goals: Good Progress towards PT goals: Not progressing toward goals - comment    Frequency    BID      PT Plan Current plan remains appropriate    Co-evaluation              AM-PAC PT "6 Clicks" Mobility   Outcome Measure  Help needed turning from your back to your side while in a flat bed without using bedrails?: A  Lot Help needed moving from lying on your back to sitting on the side of a flat bed without using bedrails?: A Lot Help needed moving to and from a bed to a chair (including a wheelchair)?: A Lot Help needed standing up from a chair using your arms (e.g., wheelchair or bedside chair)?: A Lot Help needed to walk in hospital room?: A Lot Help needed climbing 3-5 steps with a railing? : Total 6 Click Score: 11    End of Session Equipment Utilized During Treatment: Gait belt Activity Tolerance: Patient limited by pain Patient left: in chair;with call bell/phone within reach;with chair alarm set Nurse Communication: Mobility status PT Visit Diagnosis: Muscle weakness (generalized) (M62.81);Difficulty in walking, not elsewhere classified (R26.2);Pain Pain - Right/Left: Left Pain - part of body: Knee     Time: 1130-1212 PT Time Calculation (min) (ACUTE ONLY): 42 min  Charges:  $Therapeutic Activity: 38-52 mins                     Denean Pavon H. Owens Shark, PT, DPT, NCS 05/25/19, 1:30 PM [phone]

## 2019-05-25 NOTE — Plan of Care (Signed)
  Problem: Education: Goal: Knowledge of General Education information will improve Description Including pain rating scale, medication(s)/side effects and non-pharmacologic comfort measures Outcome: Progressing   Problem: Clinical Measurements: Goal: Will remain free from infection Outcome: Progressing   Problem: Nutrition: Goal: Adequate nutrition will be maintained Outcome: Progressing   Problem: Elimination: Goal: Will not experience complications related to urinary retention Outcome: Progressing   Problem: Pain Managment: Goal: General experience of comfort will improve Outcome: Progressing

## 2019-05-25 NOTE — Progress Notes (Signed)
Physical Therapy Treatment Patient Details Name: Tiffany [name] MRN: [id number] DOB: 25-Apr-1933 Today's Date: 05/25/2019    History of Present Illness 85 y/o female s/p L TKA 05/23/19.    PT Comments    Remains limited by pain, requiring act assist for all isolated movement of L knee; extremely guarded and resistant to full ROM or WBing necessary for progressive mobility. L LE noted to be weeping upon return to bed; RN informed/aware.    Follow Up Recommendations  SNF     Equipment Recommendations       Recommendations for Other Services       Precautions / Restrictions Precautions Precautions: Knee;Fall Restrictions Weight Bearing Restrictions: Yes LLE Weight Bearing: Weight bearing as tolerated    Mobility  Bed Mobility Overal bed mobility: Needs Assistance Bed Mobility: Sit to Supine       Sit to supine: Max assist   General bed mobility comments: Max assist for LE management/elevation over edge of bed, trunk control and overall positioning  Transfers Overall transfer level: Needs assistance Equipment used: Rolling walker (2 wheeled) Transfers: Sit to/from Stand Sit to Stand: Min assist;Mod assist         General transfer comment: constant cuing for hand placement and mechanics, sequencing; limited carry-over between repetitions.  Often choosing to pull on RW with ascent, 'hanging' on RW with descent  Ambulation/Gait   Gait Distance (Feet): (3-4 steps) Assistive device: Rolling walker (2 wheeled)       General Gait Details: continued to decline gait efforts despite max cuing from therapist.  States unable to advance LEs due to pain, though able to fully unweight/advance LEs with return to bed.   Stairs             Wheelchair Mobility    Modified Rankin (Stroke Patients Only)       Balance Overall balance assessment: Needs assistance Sitting-balance support: No upper extremity supported;Feet supported Sitting balance-Leahy Scale:  Fair     Standing balance support: Bilateral upper extremity supported Standing balance-Leahy Scale: Poor                              Cognition Arousal/Alertness: Awake/alert Behavior During Therapy: WFL for tasks assessed/performed Overall Cognitive Status: Within Functional Limits for tasks assessed                                 General Comments: intermittent confusion (medication/UTI related?)      Exercises Total Joint Exercises Goniometric ROM: L knee: 0-75 degrees, act assist Other Exercises Other Exercises: Seated LE therex, 1x10, act assist ROM: ankle pumps, LAQs, marching, iso hip adduct.  Limited isolated movement of L LE due to pain. Other Exercises: Multiple attempts at gait trials (beyond transfers between surfaces), min/mod assist for weight shift.  Patient unable/unwilling to accept weight in L LE for completion of any formal steps/gait training.    General Comments        Pertinent Vitals/Pain Pain Assessment: 0-10 Pain Score: 6  Faces Pain Scale: Hurts even more Pain Location: L knee  Pain Descriptors / Indicators: Moaning;Guarding;Grimacing;Discomfort Pain Intervention(s): Limited activity within patient's tolerance;Monitored during session;Repositioned    Home Living                      Prior Function            PT Goals (  current goals can now be found in the care plan section) Acute Rehab PT Goals Patient Stated Goal: go home  PT Goal Formulation: With patient Time For Goal Achievement: 06/06/19 Potential to Achieve Goals: Good Progress towards PT goals: Not progressing toward goals - comment    Frequency    BID      PT Plan Current plan remains appropriate    Co-evaluation              AM-PAC PT "6 Clicks" Mobility   Outcome Measure  Help needed turning from your back to your side while in a flat bed without using bedrails?: A Lot Help needed moving from lying on your back to sitting on  the side of a flat bed without using bedrails?: A Lot Help needed moving to and from a bed to a chair (including a wheelchair)?: A Lot Help needed standing up from a chair using your arms (e.g., wheelchair or bedside chair)?: A Lot Help needed to walk in hospital room?: A Lot Help needed climbing 3-5 steps with a railing? : Total 6 Click Score: 11    End of Session Equipment Utilized During Treatment: Gait belt Activity Tolerance: Patient limited by pain Patient left: with call bell/phone within reach;in bed;with bed alarm set Nurse Communication: Mobility status PT Visit Diagnosis: Muscle weakness (generalized) (M62.81);Difficulty in walking, not elsewhere classified (R26.2);Pain Pain - Right/Left: Left Pain - part of body: Knee     Time: 1533-1550 PT Time Calculation (min) (ACUTE ONLY): 17 min  Charges:  $Therapeutic Exercise: 8-22 mins $Therapeutic Activity: 38-52 mins                     Meliyah Simon H. Owens Shark, PT, DPT, NCS 05/25/19, 4:48 PM [phone]

## 2019-05-25 NOTE — Discharge Summary (Addendum)
Physician Discharge Summary  Patient ID: Tiffany [name] MRN: [id number] DOB/AGE: 26-Feb-1933 85 y.o.  Admit date: 05/23/2019 Discharge date: 05/26/2019  Admission Diagnoses:  PRIMARY OSTEOARTHRITIS OF LEFT KNEE  Discharge Diagnoses: Patient Active Problem List   Diagnosis Date Noted  . Status post total knee replacement using cement, left 05/23/2019  . Chronic pain of left knee 02/08/2019  . Primary osteoarthritis of left knee 02/08/2019  . Derangement of lateral meniscus, left 02/08/2019  . Chronic pain syndrome 02/08/2019    Past Medical History:  Diagnosis Date  . A-fib ([place])   . Anemia   . Arthritis   . Cataracts, both eyes   . Chickenpox   . Chronic kidney disease   . Complication of anesthesia   . Diabetes mellitus without complication ([place])   . Dyspnea    with exertion  . Dysrhythmia   . Hyperlipidemia   . Hypertension   . PONV (postoperative nausea and vomiting)    with 1st pregnancy 50 years ago and no problem since then  . Right leg weakness   . Spinal stenosis   . UTI (urinary tract infection)      Transfusion: None.   Consultants (if any):   Discharged Condition: Improved  Hospital Course: Tiffany [name] is an 85 y.o. female who was admitted 05/23/2019 with a diagnosis of left knee osteoarthritis and went to the operating room on 05/23/2019 and underwent the above named procedures.    Surgeries: Procedure(s): TOTAL KNEE ARTHROPLASTY - LEFT - DIABETIC on 05/23/2019 Patient tolerated the surgery well. Taken to PACU where she was stabilized and then transferred to the orthopedic floor.  Continued on Xarelto 15mg  daily. Foot pumps applied bilaterally at 80 mm. Heels elevated on bed with rolled towels. No evidence of DVT. Negative Homan. Physical therapy started on day #1 for gait training and transfer. OT started day #1 for ADL and assisted devices.  On POD2 UA did demonstrate signs of a UTI, she was initially started on Macrobid however due to CrCl  she was switched to Bactrim for 3 days upon discharge.  Patient's IV , foley and hemovac was d/c on day #2.  Implants: Left TKA using all-cemented Bi[employer] Vanguard system with a 70 mm PCR femur, a 75 mm tibial tray with a 10 mm anterior stabilized e-poly insert, and a 34 x 8.5 mm all-poly 3-pegged domed patella.  She was given perioperative antibiotics:  Anti-infectives (From admission, onward)   Start     Dose/Rate Route Frequency Ordered Stop   05/26/19 0000  sulfamethoxazole-trimethoprim (BACTRIM DS) 800-160 MG tablet     1 tablet Oral 2 times daily 05/26/19 1144     05/23/19 1610  ceFAZolin (ANCEF) 2-4 GM/100ML-% IVPB    Note to Pharmacy:  Alysia Penna   : cabinet override      05/23/19 1610 05/24/19 0414   05/23/19 1600  ceFAZolin (ANCEF) IVPB 2g/100 mL premix     2 g 200 mL/hr over 30 Minutes Intravenous Every 6 hours 05/23/19 1548 05/24/19 0341   05/23/19 1115  gentamicin (GARAMYCIN) 80 mg in sodium chloride 0.9 % 500 mL irrigation  Status:  Discontinued       As needed 05/23/19 1115 05/23/19 1239   05/23/19 0832  ceFAZolin (ANCEF) 2-4 GM/100ML-% IVPB    Note to Pharmacy:  Vesta Mixer   : cabinet override      05/23/19 0832 05/23/19 1030   05/22/19 2200  ceFAZolin (ANCEF) IVPB 2g/100 mL premix     2  g 200 mL/hr over 30 Minutes Intravenous  Once 05/22/19 2152 05/23/19 1100    .  She was given sequential compression devices, early ambulation, and Xarelto for DVT prophylaxis.  She benefited maximally from the hospital stay and there were no complications.    Recent vital signs:  Vitals:   05/26/19 0503 05/26/19 0747  BP: (!) 159/91 (!) 156/72  Pulse: 96 (!) 102  Resp: 20 (!) 22  Temp: 99.2 F (37.3 C) 98.2 F (36.8 C)  SpO2: 96% 100%   Recent laboratory studies:  Lab Results  Component Value Date   HGB 9.1 (L) 05/26/2019   HGB 10.2 (L) 05/25/2019   HGB 9.4 (L) 05/24/2019   Lab Results  Component Value Date   WBC 13.1 (H) 05/26/2019   PLT 187 05/26/2019    Lab Results  Component Value Date   INR 0.9 04/19/2012   Lab Results  Component Value Date   NA 138 05/26/2019   K 4.1 05/26/2019   CL 102 05/26/2019   CO2 24 05/26/2019   BUN 29 (H) 05/26/2019   CREATININE 1.79 (H) 05/26/2019   GLUCOSE 134 (H) 05/26/2019   Discharge Medications:   Allergies as of 05/26/2019      Reactions   Codeine Nausea And Vomiting      Medication List    TAKE these medications   acetaminophen 500 MG tablet Commonly known as:  TYLENOL Take 500 mg by mouth every 6 (six) hours as needed (pain).   albuterol 108 (90 Base) MCG/ACT inhaler Commonly known as:  VENTOLIN HFA Inhale 1-2 puffs into the lungs every 4 (four) hours as needed for wheezing or shortness of breath. ONLY USES FOR SOB-DENIES ASTHMA/COPD   flecainide 100 MG tablet Commonly known as:  TAMBOCOR Take 100 mg by mouth 2 (two) times daily.   glimepiride 2 MG tablet Commonly known as:  AMARYL Take 2 mg by mouth daily with breakfast.   HYDROcodone-acetaminophen 5-325 MG tablet Commonly known as:  NORCO/VICODIN Take 1 tablet by mouth every 4 (four) hours as needed for severe pain. What changed:    how much to take  when to take this  reasons to take this   insulin aspart 100 UNIT/ML injection Commonly known as:  novoLOG Inject 8 Units into the skin every evening. With supper   metoprolol succinate 50 MG 24 hr tablet Commonly known as:  TOPROL-XL Take 25 mg by mouth every morning. Take with or immediately following a meal.   polyethylene glycol 17 g packet Commonly known as:  MIRALAX / GLYCOLAX Take 17 g by mouth daily as needed for moderate constipation.   rivaroxaban 20 MG Tabs tablet Commonly known as:  XARELTO Take 20 mg by mouth daily with breakfast.   sulfamethoxazole-trimethoprim 800-160 MG tablet Commonly known as:  BACTRIM DS Take 1 tablet by mouth 2 (two) times daily.   torsemide 20 MG tablet Commonly known as:  DEMADEX Take 10 mg by mouth daily.   Toujeo  SoloStar 300 UNIT/ML Sopn Generic drug:  Insulin Glargine Inject 5-20 Units into the skin every evening.   traMADol 50 MG tablet Commonly known as:  ULTRAM Take 1 tablet (50 mg total) by mouth every 6 (six) hours as needed for moderate pain.            Durable Medical Equipment  (From admission, onward)         Start     Ordered   05/23/19 1549  DME Bedside commode  Once  Question:  Patient needs a bedside commode to treat with the following condition  Answer:  Status post total knee replacement using cement, left   05/23/19 1548   05/23/19 1549  DME 3 n 1  Once     05/23/19 1548   05/23/19 1549  DME Walker rolling  Once    Question:  Patient needs a walker to treat with the following condition  Answer:  Status post total knee replacement using cement, left   05/23/19 1548         Diagnostic Studies: Dg Knee Left Port  Result Date: 05/23/2019 CLINICAL DATA:  Followup knee replacement. EXAM: PORTABLE LEFT KNEE - 1-2 VIEW COMPARISON:  None. FINDINGS: Previous total knee arthroplasty. Components appear well positioned. Air and fluid in the joint as expected. IMPRESSION: Good appearance following total knee arthroplasty. Electronically Signed   By: Nelson Chimes M.D.   On: 05/23/2019 13:30   Disposition: Discharge disposition: 03-Skilled [place] for discharge to rehab on either 05/25/19 or 05/26/19 pending progress with PT and having a BM.   Contact information for follow-up providers    Lattie Corns, PA-C On 06/07/2019.   Specialty:  Physician Assistant Why:  [employer].; @ 1:15 pm Contact information: [place] [address] [phone]            Contact information for after-discharge care    [facility] .   Service:  Skilled Nursing Contact information: [address] [place] [phone]                  Signed: Judson Roch PA-C 05/26/2019, 11:45 AM

## 2019-05-25 NOTE — Progress Notes (Signed)
Subjective: 2 Days Post-Op Procedure(s) (LRB): TOTAL KNEE ARTHROPLASTY - LEFT - DIABETIC (Left) Patient reports pain as moderate.   Patient is well this morning but does have some confusion. She is able to answer where she is and my name however she began talking about seeing alligators in a marsh at her sons house when asked about stomach pain. PT and Care management to assist with discharge planning, original plan was for discharge home but will likely need d/c to SNF at this time. Negative for chest pain and shortness of breath Fever: no Gastrointestinal:Negative for nausea and vomiting Denies any urinary symptoms.  Objective: Vital signs in last 24 hours: Temp:  [98.2 F (36.8 C)-98.4 F (36.9 C)] 98.2 F (36.8 C) (05/28 0757) Pulse Rate:  [91-110] 95 (05/28 0757) Resp:  [16-18] 16 (05/28 0757) BP: (135-175)/(77-100) 171/89 (05/28 0757) SpO2:  [95 %-100 %] 97 % (05/28 0757)  Intake/Output from previous day:  Intake/Output Summary (Last 24 hours) at 05/25/2019 0758 Last data filed at 05/24/2019 1817 Gross per 24 hour  Intake 1198.7 ml  Output -  Net 1198.7 ml    Intake/Output this shift: No intake/output data recorded.  Labs: Recent Labs    05/24/19 0436 05/25/19 0419  HGB 9.4* 10.2*   Recent Labs    05/24/19 0436 05/25/19 0419  WBC 8.5 13.1*  RBC 3.46* 3.73*  HCT 30.0* 31.8*  PLT 169 177   Recent Labs    05/24/19 0436 05/25/19 0419  NA 138 137  K 4.2 4.0  CL 108 104  CO2 22 22  BUN 25* 22  CREATININE 1.42* 1.44*  GLUCOSE 183* 185*  CALCIUM 7.9* 8.8*   No results for input(s): LABPT, INR in the last 72 hours.   EXAM General - Patient is Alert and Oriented with some confusion. Extremity - ABD soft Sensation intact distally Intact pulses distally Dorsiflexion/Plantar flexion intact Incision: scant drainage No cellulitis present Dressing/Incision - Mild blood tinged drainage to the left knee. Motor Function - intact, moving foot and toes well  on exam.  Abdomen is soft with normal BS. Negative Homan's to the left leg. No facial droop this AM, able to smile and lift eyebrows.  No slurred speech.  Past Medical History:  Diagnosis Date  . A-fib ([place])   . Anemia   . Arthritis   . Cataracts, both eyes   . Chickenpox   . Chronic kidney disease   . Complication of anesthesia   . Diabetes mellitus without complication ([place])   . Dyspnea    with exertion  . Dysrhythmia   . Hyperlipidemia   . Hypertension   . PONV (postoperative nausea and vomiting)    with 1st pregnancy 50 years ago and no problem since then  . Right leg weakness   . Spinal stenosis   . UTI (urinary tract infection)     Assessment/Plan: 2 Days Post-Op Procedure(s) (LRB): TOTAL KNEE ARTHROPLASTY - LEFT - DIABETIC (Left) Active Problems:   Status post total knee replacement using cement, left  Estimated body mass index is 37.49 kg/m as calculated from the following:   Height as of this encounter: 5\' 2"  (1.575 m).   Weight as of this encounter: 93 kg. Advance diet Up with therapy   Labs reviewed, WBC 13.1 this morning.  Denies any SOB, chest pain or urinary symptoms. Encouraged continued use of the incentive spirometer.  Will order UA to check for UTI.   Mild confusion this morning.  Will change oxycodone to Norco.  Plan on using Tylenol and Tramadol primarily and avoid Norco unless necessary. Up with therapy today.  Care management to assist with discharge planning. Continue to work on BM. Possible discharge tomorrow pending progress today.  DVT Prophylaxis - Foot Pumps, TED hose and Xarelto Weight-Bearing as tolerated to left leg  J. Cameron Proud, PA-C [facility] Orthopaedic Surgery 05/25/2019, 7:58 AM

## 2019-05-25 NOTE — Discharge Instructions (Signed)

## 2019-05-26 DIAGNOSIS — Z96652 Presence of left artificial knee joint: Secondary | ICD-10-CM | POA: Diagnosis not present

## 2019-05-26 DIAGNOSIS — Z471 Aftercare following joint replacement surgery: Secondary | ICD-10-CM | POA: Diagnosis not present

## 2019-05-26 DIAGNOSIS — D649 Anemia, unspecified: Secondary | ICD-10-CM | POA: Diagnosis not present

## 2019-05-26 DIAGNOSIS — Z7401 Bed confinement status: Secondary | ICD-10-CM | POA: Diagnosis not present

## 2019-05-26 DIAGNOSIS — I480 Paroxysmal atrial fibrillation: Secondary | ICD-10-CM | POA: Diagnosis not present

## 2019-05-26 DIAGNOSIS — E1122 Type 2 diabetes mellitus with diabetic chronic kidney disease: Secondary | ICD-10-CM | POA: Diagnosis not present

## 2019-05-26 DIAGNOSIS — E785 Hyperlipidemia, unspecified: Secondary | ICD-10-CM | POA: Diagnosis not present

## 2019-05-26 DIAGNOSIS — I5022 Chronic systolic (congestive) heart failure: Secondary | ICD-10-CM | POA: Diagnosis not present

## 2019-05-26 DIAGNOSIS — M1990 Unspecified osteoarthritis, unspecified site: Secondary | ICD-10-CM | POA: Diagnosis not present

## 2019-05-26 DIAGNOSIS — M25812 Other specified joint disorders, left shoulder: Secondary | ICD-10-CM | POA: Diagnosis not present

## 2019-05-26 DIAGNOSIS — I129 Hypertensive chronic kidney disease with stage 1 through stage 4 chronic kidney disease, or unspecified chronic kidney disease: Secondary | ICD-10-CM | POA: Diagnosis not present

## 2019-05-26 DIAGNOSIS — N183 Chronic kidney disease, stage 3 (moderate): Secondary | ICD-10-CM | POA: Diagnosis not present

## 2019-05-26 DIAGNOSIS — I4891 Unspecified atrial fibrillation: Secondary | ICD-10-CM | POA: Diagnosis not present

## 2019-05-26 DIAGNOSIS — M5416 Radiculopathy, lumbar region: Secondary | ICD-10-CM | POA: Diagnosis not present

## 2019-05-26 DIAGNOSIS — N390 Urinary tract infection, site not specified: Secondary | ICD-10-CM | POA: Insufficient documentation

## 2019-05-26 DIAGNOSIS — I130 Hypertensive heart and chronic kidney disease with heart failure and stage 1 through stage 4 chronic kidney disease, or unspecified chronic kidney disease: Secondary | ICD-10-CM | POA: Diagnosis not present

## 2019-05-26 DIAGNOSIS — G894 Chronic pain syndrome: Secondary | ICD-10-CM | POA: Diagnosis not present

## 2019-05-26 DIAGNOSIS — R5381 Other malaise: Secondary | ICD-10-CM | POA: Diagnosis not present

## 2019-05-26 DIAGNOSIS — M1711 Unilateral primary osteoarthritis, right knee: Secondary | ICD-10-CM | POA: Diagnosis not present

## 2019-05-26 DIAGNOSIS — E119 Type 2 diabetes mellitus without complications: Secondary | ICD-10-CM | POA: Insufficient documentation

## 2019-05-26 DIAGNOSIS — N184 Chronic kidney disease, stage 4 (severe): Secondary | ICD-10-CM | POA: Insufficient documentation

## 2019-05-26 DIAGNOSIS — Z20828 Contact with and (suspected) exposure to other viral communicable diseases: Secondary | ICD-10-CM | POA: Diagnosis not present

## 2019-05-26 DIAGNOSIS — M2550 Pain in unspecified joint: Secondary | ICD-10-CM | POA: Diagnosis not present

## 2019-05-26 LAB — CBC
HCT: 28.1 % — ABNORMAL LOW (ref 36.0–46.0)
Hemoglobin: 9.1 g/dL — ABNORMAL LOW (ref 12.0–15.0)
MCH: 27.4 pg (ref 26.0–34.0)
MCHC: 32.4 g/dL (ref 30.0–36.0)
MCV: 84.6 fL (ref 80.0–100.0)
Platelets: 187 10*3/uL (ref 150–400)
RBC: 3.32 MIL/uL — ABNORMAL LOW (ref 3.87–5.11)
RDW: 14.3 % (ref 11.5–15.5)
WBC: 13.1 10*3/uL — ABNORMAL HIGH (ref 4.0–10.5)
nRBC: 0 % (ref 0.0–0.2)

## 2019-05-26 LAB — GLUCOSE, CAPILLARY
Glucose-Capillary: 111 mg/dL — ABNORMAL HIGH (ref 70–99)
Glucose-Capillary: 179 mg/dL — ABNORMAL HIGH (ref 70–99)

## 2019-05-26 LAB — BASIC METABOLIC PANEL
Anion gap: 12 (ref 5–15)
BUN: 29 mg/dL — ABNORMAL HIGH (ref 8–23)
CO2: 24 mmol/L (ref 22–32)
Calcium: 8.7 mg/dL — ABNORMAL LOW (ref 8.9–10.3)
Chloride: 102 mmol/L (ref 98–111)
Creatinine, Ser: 1.79 mg/dL — ABNORMAL HIGH (ref 0.44–1.00)
GFR calc Af Amer: 30 mL/min — ABNORMAL LOW (ref >60)
GFR calc non Af Amer: 26 mL/min — ABNORMAL LOW (ref >60)
Glucose, Bld: 134 mg/dL — ABNORMAL HIGH (ref 70–99)
Potassium: 4.1 mmol/L (ref 3.5–5.1)
Sodium: 138 mmol/L (ref 135–145)

## 2019-05-26 MED ORDER — SULFAMETHOXAZOLE-TRIMETHOPRIM 800-160 MG PO TABS: 1.0000 | ORAL_TABLET | Freq: Two times a day (BID) | ORAL | 0 refills | Status: DC

## 2019-05-26 NOTE — Progress Notes (Signed)
Physical Therapy Treatment Patient Details Name: Tiffany [name] MRN: [id number] DOB: 20-Oct-1933 Today's Date: 05/26/2019    History of Present Illness 85 y/o female s/p L TKA 05/23/19.    PT Comments    Pt is making limited progress towards goals due to pain and fearful attitude. Upon arrival, towel roll under knee. Educated pt on ROM goals, left pt in bed with towel roll under ankle to promote L knee extension. Limited progress with HEP. Able to side step at bedside with +2 and RW. Minimal progress with ROM. Pt very pleasant. Will continue to progress. Noticed increased weeping on distal L LE with wash cloth under leg upon arrival, notified RN.  Follow Up Recommendations  SNF     Equipment Recommendations  3in1 (PT)    Recommendations for Other Services OT consult     Precautions / Restrictions Precautions Precautions: Knee;Fall Precaution Booklet Issued: Yes (comment) Restrictions Weight Bearing Restrictions: Yes LLE Weight Bearing: Weight bearing as tolerated    Mobility  Bed Mobility Overal bed mobility: Needs Assistance Bed Mobility: Supine to Sit;Sit to Supine     Supine to sit: Mod assist;+2 for physical assistance Sit to supine: Max assist;+2 for physical assistance   General bed mobility comments: needs cues for sequencing, translation of B LE over bed, assist with trunk. Poor sitting tolerance once EOB, painful  Transfers Overall transfer level: Needs assistance Equipment used: Rolling walker (2 wheeled) Transfers: Sit to/from Stand Sit to Stand: Mod assist;+2 physical assistance         General transfer comment: tries to pull on RW, poor carry over. Slow lift off. Once standing, able to stand with upright posture and improved weight acceptance on surgical leg  Ambulation/Gait Ambulation/Gait assistance: Min assist;+2 physical assistance Gait Distance (Feet): 4 Feet Assistive device: Rolling walker (2 wheeled) Gait Pattern/deviations: Step-to  pattern     General Gait Details: heavy cues for encouragement. Slow and short steps using RW. Fatigues quickly and reports increased pain. Only able to side step a few steps   Stairs             Wheelchair Mobility    Modified Rankin (Stroke Patients Only)       Balance Overall balance assessment: Needs assistance Sitting-balance support: Feet supported Sitting balance-Leahy Scale: Fair     Standing balance support: Bilateral upper extremity supported Standing balance-Leahy Scale: Poor                              Cognition Arousal/Alertness: Awake/alert Behavior During Therapy: WFL for tasks assessed/performed Overall Cognitive Status: Within Functional Limits for tasks assessed                                        Exercises Total Joint Exercises Goniometric ROM: L knee 3-80 degrees Other Exercises Other Exercises: supine ther-ex performed including ankle pumps, quad sets, heel slides, SAQ, and SLRs. Mod/max assist and cues for technique. Other Exercises: Once standing, pt urinated in floor, needing multiple transfer for sit<>stand improving weight shifting. RW used    General Comments        Pertinent Vitals/Pain Pain Assessment: 0-10 Pain Score: 5  Pain Location: L knee  Pain Descriptors / Indicators: Moaning;Guarding;Grimacing;Discomfort Pain Intervention(s): Limited activity within patient's tolerance;Ice applied    Home Living  Prior Function            PT Goals (current goals can now be found in the care plan section) Acute Rehab PT Goals Patient Stated Goal: go home  PT Goal Formulation: With patient Time For Goal Achievement: 06/06/19 Potential to Achieve Goals: Good Progress towards PT goals: Progressing toward goals    Frequency    BID      PT Plan Current plan remains appropriate    Co-evaluation              AM-PAC PT "6 Clicks" Mobility   Outcome  Measure  Help needed turning from your back to your side while in a flat bed without using bedrails?: A Lot Help needed moving from lying on your back to sitting on the side of a flat bed without using bedrails?: A Lot Help needed moving to and from a bed to a chair (including a wheelchair)?: A Lot Help needed standing up from a chair using your arms (e.g., wheelchair or bedside chair)?: A Lot Help needed to walk in hospital room?: A Lot Help needed climbing 3-5 steps with a railing? : Total 6 Click Score: 11    End of Session Equipment Utilized During Treatment: Gait belt Activity Tolerance: Patient limited by pain Patient left: in bed;with bed alarm set;with call bell/phone within reach;with SCD's reapplied Nurse Communication: Mobility status PT Visit Diagnosis: Muscle weakness (generalized) (M62.81);Difficulty in walking, not elsewhere classified (R26.2);Pain Pain - Right/Left: Left Pain - part of body: Knee     Time: 0925-0958 PT Time Calculation (min) (ACUTE ONLY): 33 min  Charges:  $Gait Training: 8-22 mins $Therapeutic Exercise: 8-22 mins                     Greggory Stallion, PT, DPT [phone]    Tiffany [name] 05/26/2019, 10:45 AM

## 2019-05-26 NOTE — Progress Notes (Signed)
Called report to [employer] spoke to [employer] gave report on pt.

## 2019-05-26 NOTE — TOC Transition Note (Signed)
Transition of Care [facility]) - CM/SW Discharge Note   Patient Details  Name: Tiffany [name] MRN: [id number] Date of Birth: 12/04/1933  [facility] CM/SW Contact:  Su Hilt, RN Phone Number: 05/26/2019, 9:04 AM   Clinical Narrative:     Patient to DC to [place] room 503, Bedside nurse to call EMS when ready for transport, Husband Mikki Santee was notified of the DC and would like to be called by Bedside nurse once EMS arrives, Report to be called by Bedside nurse at [phone]   Final next level of care: Skilled Nursing Facility Barriers to Discharge: Barriers Resolved   Patient Goals and CMS Choice Patient states their goals for this hospitalization and ongoing recovery are:: go home      Discharge Placement                       Discharge Plan and Services   Discharge Planning Services: CM Consult                                 Social Determinants of Health (SDOH) Interventions     Readmission Risk Interventions No flowsheet data found.

## 2019-05-26 NOTE — Progress Notes (Signed)
Subjective: 3 Days Post-Op Procedure(s) (LRB): TOTAL KNEE ARTHROPLASTY - LEFT - DIABETIC (Left) Patient reports pain as moderate.   Patient is well this morning, confusion has improved. Denies any SOB, chest pain, abdominal pain. Current plan is for discharge today to SNF. Negative for chest pain and shortness of breath Fever: 99.2 last night. Gastrointestinal:Negative for nausea and vomiting Denies any urinary symptoms.  Objective: Vital signs in last 24 hours: Temp:  [98.2 F (36.8 C)-99.2 F (37.3 C)] 98.2 F (36.8 C) (05/29 0747) Pulse Rate:  [91-102] 102 (05/29 0747) Resp:  [19-22] 22 (05/29 0747) BP: (152-159)/(72-91) 156/72 (05/29 0747) SpO2:  [96 %-100 %] 100 % (05/29 0747)  Intake/Output from previous day:  Intake/Output Summary (Last 24 hours) at 05/26/2019 1139 Last data filed at 05/26/2019 1022 Gross per 24 hour  Intake 240 ml  Output 228 ml  Net 12 ml    Intake/Output this shift: Total I/O In: 120 [P.O.:120] Out: -   Labs: Recent Labs    05/24/19 0436 05/25/19 0419 05/26/19 0431  HGB 9.4* 10.2* 9.1*   Recent Labs    05/25/19 0419 05/26/19 0431  WBC 13.1* 13.1*  RBC 3.73* 3.32*  HCT 31.8* 28.1*  PLT 177 187   Recent Labs    05/25/19 0419 05/26/19 0431  NA 137 138  K 4.0 4.1  CL 104 102  CO2 22 24  BUN 22 29*  CREATININE 1.44* 1.79*  GLUCOSE 185* 134*  CALCIUM 8.8* 8.7*   No results for input(s): LABPT, INR in the last 72 hours.   EXAM General - Patient is Alert, Appropriate and Oriented Extremity - ABD soft Sensation intact distally Intact pulses distally Dorsiflexion/Plantar flexion intact Incision: scant drainage No cellulitis present Dressing/Incision - Mild increase in bloody drainage to the left knee. Motor Function - intact, moving foot and toes well on exam.  Abdomen is soft with normal BS. Negative Homan's to the left leg.  Past Medical History:  Diagnosis Date  . A-fib ([place])   . Anemia   . Arthritis   .  Cataracts, both eyes   . Chickenpox   . Chronic kidney disease   . Complication of anesthesia   . Diabetes mellitus without complication ([place])   . Dyspnea    with exertion  . Dysrhythmia   . Hyperlipidemia   . Hypertension   . PONV (postoperative nausea and vomiting)    with 1st pregnancy 50 years ago and no problem since then  . Right leg weakness   . Spinal stenosis   . UTI (urinary tract infection)     Assessment/Plan: 3 Days Post-Op Procedure(s) (LRB): TOTAL KNEE ARTHROPLASTY - LEFT - DIABETIC (Left) Active Problems:   Status post total knee replacement using cement, left  Estimated body mass index is 37.49 kg/m as calculated from the following:   Height as of this encounter: 5\' 2"  (1.575 m).   Weight as of this encounter: 93 kg. Advance diet Up with therapy   Labs reviewed, WBC 13.1 this morning.  Denies any SOB, chest pain or urinary symptoms. Will discharge on oral Abx for UTI, mild signs of UTI on UA.  Will switch from Macrobid to Bactrim per UpToDate guidelines. Continue with current pain management regimen. Patient has had a BM. Plan for discharge to rehab this afternoon.  DVT Prophylaxis - Foot Pumps, TED hose and Xarelto Weight-Bearing as tolerated to left leg  J. Cameron Proud, PA-C [facility] Orthopaedic Surgery 05/26/2019, 11:39 AM

## 2019-05-26 NOTE — Progress Notes (Signed)
EMS here to transport patient to room [place]. LM for husband on his cell phone letting him know that his wife left.

## 2019-05-29 DIAGNOSIS — N184 Chronic kidney disease, stage 4 (severe): Secondary | ICD-10-CM | POA: Diagnosis not present

## 2019-05-29 DIAGNOSIS — E1122 Type 2 diabetes mellitus with diabetic chronic kidney disease: Secondary | ICD-10-CM | POA: Diagnosis not present

## 2019-05-29 DIAGNOSIS — M1711 Unilateral primary osteoarthritis, right knee: Secondary | ICD-10-CM | POA: Diagnosis not present

## 2019-05-29 DIAGNOSIS — I480 Paroxysmal atrial fibrillation: Secondary | ICD-10-CM | POA: Diagnosis not present

## 2019-05-29 DIAGNOSIS — I5022 Chronic systolic (congestive) heart failure: Secondary | ICD-10-CM | POA: Diagnosis not present

## 2019-05-29 DIAGNOSIS — M8949 Other hypertrophic osteoarthropathy, multiple sites: Secondary | ICD-10-CM | POA: Diagnosis not present

## 2019-05-29 DIAGNOSIS — Z794 Long term (current) use of insulin: Secondary | ICD-10-CM | POA: Diagnosis not present

## 2019-05-29 DIAGNOSIS — I130 Hypertensive heart and chronic kidney disease with heart failure and stage 1 through stage 4 chronic kidney disease, or unspecified chronic kidney disease: Secondary | ICD-10-CM | POA: Insufficient documentation

## 2019-05-30 DIAGNOSIS — E1122 Type 2 diabetes mellitus with diabetic chronic kidney disease: Secondary | ICD-10-CM | POA: Diagnosis not present

## 2019-05-30 DIAGNOSIS — I129 Hypertensive chronic kidney disease with stage 1 through stage 4 chronic kidney disease, or unspecified chronic kidney disease: Secondary | ICD-10-CM | POA: Diagnosis not present

## 2019-05-30 DIAGNOSIS — Z4801 Encounter for change or removal of surgical wound dressing: Secondary | ICD-10-CM | POA: Diagnosis not present

## 2019-05-30 DIAGNOSIS — M4800 Spinal stenosis, site unspecified: Secondary | ICD-10-CM | POA: Diagnosis not present

## 2019-05-30 DIAGNOSIS — I4891 Unspecified atrial fibrillation: Secondary | ICD-10-CM | POA: Diagnosis not present

## 2019-05-30 DIAGNOSIS — D631 Anemia in chronic kidney disease: Secondary | ICD-10-CM | POA: Diagnosis not present

## 2019-05-30 DIAGNOSIS — G894 Chronic pain syndrome: Secondary | ICD-10-CM | POA: Diagnosis not present

## 2019-05-30 DIAGNOSIS — E785 Hyperlipidemia, unspecified: Secondary | ICD-10-CM | POA: Diagnosis not present

## 2019-05-30 DIAGNOSIS — N183 Chronic kidney disease, stage 3 (moderate): Secondary | ICD-10-CM | POA: Diagnosis not present

## 2019-05-30 DIAGNOSIS — Z7984 Long term (current) use of oral hypoglycemic drugs: Secondary | ICD-10-CM | POA: Diagnosis not present

## 2019-05-30 DIAGNOSIS — Z7901 Long term (current) use of anticoagulants: Secondary | ICD-10-CM | POA: Diagnosis not present

## 2019-05-30 DIAGNOSIS — Z96652 Presence of left artificial knee joint: Secondary | ICD-10-CM | POA: Diagnosis not present

## 2019-05-30 DIAGNOSIS — Z471 Aftercare following joint replacement surgery: Secondary | ICD-10-CM | POA: Diagnosis not present

## 2019-05-30 DIAGNOSIS — M1990 Unspecified osteoarthritis, unspecified site: Secondary | ICD-10-CM | POA: Diagnosis not present

## 2019-06-16 DIAGNOSIS — M48061 Spinal stenosis, lumbar region without neurogenic claudication: Secondary | ICD-10-CM | POA: Diagnosis not present

## 2019-06-16 DIAGNOSIS — E7849 Other hyperlipidemia: Secondary | ICD-10-CM | POA: Diagnosis not present

## 2019-06-16 DIAGNOSIS — M1711 Unilateral primary osteoarthritis, right knee: Secondary | ICD-10-CM | POA: Diagnosis not present

## 2019-06-16 DIAGNOSIS — I10 Essential (primary) hypertension: Secondary | ICD-10-CM | POA: Diagnosis not present

## 2019-06-16 DIAGNOSIS — D649 Anemia, unspecified: Secondary | ICD-10-CM | POA: Diagnosis not present

## 2019-06-16 DIAGNOSIS — Z794 Long term (current) use of insulin: Secondary | ICD-10-CM | POA: Diagnosis not present

## 2019-06-16 DIAGNOSIS — N184 Chronic kidney disease, stage 4 (severe): Secondary | ICD-10-CM | POA: Diagnosis not present

## 2019-06-16 DIAGNOSIS — E1122 Type 2 diabetes mellitus with diabetic chronic kidney disease: Secondary | ICD-10-CM | POA: Diagnosis not present

## 2019-06-16 DIAGNOSIS — M5416 Radiculopathy, lumbar region: Secondary | ICD-10-CM | POA: Diagnosis not present

## 2019-06-16 DIAGNOSIS — I5022 Chronic systolic (congestive) heart failure: Secondary | ICD-10-CM | POA: Diagnosis not present

## 2019-06-16 DIAGNOSIS — I480 Paroxysmal atrial fibrillation: Secondary | ICD-10-CM | POA: Diagnosis not present

## 2019-07-10 DIAGNOSIS — N184 Chronic kidney disease, stage 4 (severe): Secondary | ICD-10-CM | POA: Diagnosis not present

## 2019-07-10 DIAGNOSIS — Z794 Long term (current) use of insulin: Secondary | ICD-10-CM | POA: Diagnosis not present

## 2019-07-10 DIAGNOSIS — E1122 Type 2 diabetes mellitus with diabetic chronic kidney disease: Secondary | ICD-10-CM | POA: Diagnosis not present

## 2019-07-10 DIAGNOSIS — M1712 Unilateral primary osteoarthritis, left knee: Secondary | ICD-10-CM | POA: Diagnosis not present

## 2019-07-10 DIAGNOSIS — D649 Anemia, unspecified: Secondary | ICD-10-CM | POA: Diagnosis not present

## 2019-08-21 DIAGNOSIS — Z96652 Presence of left artificial knee joint: Secondary | ICD-10-CM | POA: Diagnosis not present

## 2019-08-21 DIAGNOSIS — M1711 Unilateral primary osteoarthritis, right knee: Secondary | ICD-10-CM | POA: Diagnosis not present

## 2019-10-16 DIAGNOSIS — Z794 Long term (current) use of insulin: Secondary | ICD-10-CM | POA: Diagnosis not present

## 2019-10-16 DIAGNOSIS — D649 Anemia, unspecified: Secondary | ICD-10-CM | POA: Diagnosis not present

## 2019-10-16 DIAGNOSIS — E7849 Other hyperlipidemia: Secondary | ICD-10-CM | POA: Diagnosis not present

## 2019-10-16 DIAGNOSIS — I480 Paroxysmal atrial fibrillation: Secondary | ICD-10-CM | POA: Diagnosis not present

## 2019-10-16 DIAGNOSIS — I10 Essential (primary) hypertension: Secondary | ICD-10-CM | POA: Diagnosis not present

## 2019-10-16 DIAGNOSIS — E1122 Type 2 diabetes mellitus with diabetic chronic kidney disease: Secondary | ICD-10-CM | POA: Diagnosis not present

## 2019-10-16 DIAGNOSIS — N184 Chronic kidney disease, stage 4 (severe): Secondary | ICD-10-CM | POA: Diagnosis not present

## 2019-10-16 DIAGNOSIS — M48061 Spinal stenosis, lumbar region without neurogenic claudication: Secondary | ICD-10-CM | POA: Diagnosis not present

## 2019-10-16 DIAGNOSIS — I5022 Chronic systolic (congestive) heart failure: Secondary | ICD-10-CM | POA: Diagnosis not present

## 2019-11-27 DIAGNOSIS — M1712 Unilateral primary osteoarthritis, left knee: Secondary | ICD-10-CM | POA: Diagnosis not present

## 2019-11-27 DIAGNOSIS — Z96652 Presence of left artificial knee joint: Secondary | ICD-10-CM | POA: Diagnosis not present

## 2019-12-26 DIAGNOSIS — Z794 Long term (current) use of insulin: Secondary | ICD-10-CM | POA: Diagnosis not present

## 2019-12-26 DIAGNOSIS — E7849 Other hyperlipidemia: Secondary | ICD-10-CM | POA: Diagnosis not present

## 2019-12-26 DIAGNOSIS — E1122 Type 2 diabetes mellitus with diabetic chronic kidney disease: Secondary | ICD-10-CM | POA: Diagnosis not present

## 2019-12-26 DIAGNOSIS — N184 Chronic kidney disease, stage 4 (severe): Secondary | ICD-10-CM | POA: Diagnosis not present

## 2019-12-26 DIAGNOSIS — I10 Essential (primary) hypertension: Secondary | ICD-10-CM | POA: Diagnosis not present

## 2019-12-26 DIAGNOSIS — D649 Anemia, unspecified: Secondary | ICD-10-CM | POA: Diagnosis not present

## 2020-01-02 DIAGNOSIS — Z20828 Contact with and (suspected) exposure to other viral communicable diseases: Secondary | ICD-10-CM | POA: Diagnosis not present

## 2020-01-02 DIAGNOSIS — Z20822 Contact with and (suspected) exposure to covid-19: Secondary | ICD-10-CM | POA: Diagnosis not present

## 2020-04-22 DIAGNOSIS — E1122 Type 2 diabetes mellitus with diabetic chronic kidney disease: Secondary | ICD-10-CM | POA: Diagnosis not present

## 2020-04-22 DIAGNOSIS — N184 Chronic kidney disease, stage 4 (severe): Secondary | ICD-10-CM | POA: Diagnosis not present

## 2020-04-22 DIAGNOSIS — Z794 Long term (current) use of insulin: Secondary | ICD-10-CM | POA: Diagnosis not present

## 2020-04-22 DIAGNOSIS — I480 Paroxysmal atrial fibrillation: Secondary | ICD-10-CM | POA: Diagnosis not present

## 2020-04-22 DIAGNOSIS — I10 Essential (primary) hypertension: Secondary | ICD-10-CM | POA: Diagnosis not present

## 2020-04-22 DIAGNOSIS — E7849 Other hyperlipidemia: Secondary | ICD-10-CM | POA: Diagnosis not present

## 2020-04-29 DIAGNOSIS — E1122 Type 2 diabetes mellitus with diabetic chronic kidney disease: Secondary | ICD-10-CM | POA: Diagnosis not present

## 2020-04-29 DIAGNOSIS — I10 Essential (primary) hypertension: Secondary | ICD-10-CM | POA: Diagnosis not present

## 2020-04-29 DIAGNOSIS — M48061 Spinal stenosis, lumbar region without neurogenic claudication: Secondary | ICD-10-CM | POA: Diagnosis not present

## 2020-04-29 DIAGNOSIS — Z79899 Other long term (current) drug therapy: Secondary | ICD-10-CM | POA: Diagnosis not present

## 2020-04-29 DIAGNOSIS — M5416 Radiculopathy, lumbar region: Secondary | ICD-10-CM | POA: Diagnosis not present

## 2020-04-29 DIAGNOSIS — Z0000 Encounter for general adult medical examination without abnormal findings: Secondary | ICD-10-CM | POA: Diagnosis not present

## 2020-04-29 DIAGNOSIS — I5022 Chronic systolic (congestive) heart failure: Secondary | ICD-10-CM | POA: Diagnosis not present

## 2020-04-29 DIAGNOSIS — D649 Anemia, unspecified: Secondary | ICD-10-CM | POA: Diagnosis not present

## 2020-04-29 DIAGNOSIS — N184 Chronic kidney disease, stage 4 (severe): Secondary | ICD-10-CM | POA: Diagnosis not present

## 2020-04-29 DIAGNOSIS — I480 Paroxysmal atrial fibrillation: Secondary | ICD-10-CM | POA: Diagnosis not present

## 2020-04-29 DIAGNOSIS — E7849 Other hyperlipidemia: Secondary | ICD-10-CM | POA: Diagnosis not present

## 2020-07-24 DIAGNOSIS — I10 Essential (primary) hypertension: Secondary | ICD-10-CM | POA: Diagnosis not present

## 2020-07-24 DIAGNOSIS — D649 Anemia, unspecified: Secondary | ICD-10-CM | POA: Diagnosis not present

## 2020-07-24 DIAGNOSIS — E7849 Other hyperlipidemia: Secondary | ICD-10-CM | POA: Diagnosis not present

## 2020-07-24 DIAGNOSIS — N184 Chronic kidney disease, stage 4 (severe): Secondary | ICD-10-CM | POA: Diagnosis not present

## 2020-07-24 DIAGNOSIS — Z794 Long term (current) use of insulin: Secondary | ICD-10-CM | POA: Diagnosis not present

## 2020-07-24 DIAGNOSIS — Z79899 Other long term (current) drug therapy: Secondary | ICD-10-CM | POA: Diagnosis not present

## 2020-07-24 DIAGNOSIS — E1122 Type 2 diabetes mellitus with diabetic chronic kidney disease: Secondary | ICD-10-CM | POA: Diagnosis not present

## 2020-07-29 DIAGNOSIS — I5022 Chronic systolic (congestive) heart failure: Secondary | ICD-10-CM | POA: Diagnosis not present

## 2020-07-29 DIAGNOSIS — E7849 Other hyperlipidemia: Secondary | ICD-10-CM | POA: Diagnosis not present

## 2020-07-29 DIAGNOSIS — E1122 Type 2 diabetes mellitus with diabetic chronic kidney disease: Secondary | ICD-10-CM | POA: Diagnosis not present

## 2020-07-29 DIAGNOSIS — I10 Essential (primary) hypertension: Secondary | ICD-10-CM | POA: Diagnosis not present

## 2020-07-29 DIAGNOSIS — Z993 Dependence on wheelchair: Secondary | ICD-10-CM | POA: Diagnosis not present

## 2020-07-29 DIAGNOSIS — N184 Chronic kidney disease, stage 4 (severe): Secondary | ICD-10-CM | POA: Diagnosis not present

## 2020-07-29 DIAGNOSIS — I480 Paroxysmal atrial fibrillation: Secondary | ICD-10-CM | POA: Diagnosis not present

## 2020-07-29 DIAGNOSIS — Z794 Long term (current) use of insulin: Secondary | ICD-10-CM | POA: Diagnosis not present

## 2020-07-29 DIAGNOSIS — Z79891 Long term (current) use of opiate analgesic: Secondary | ICD-10-CM | POA: Diagnosis not present

## 2020-07-29 DIAGNOSIS — M48061 Spinal stenosis, lumbar region without neurogenic claudication: Secondary | ICD-10-CM | POA: Diagnosis not present

## 2020-08-07 DIAGNOSIS — M48062 Spinal stenosis, lumbar region with neurogenic claudication: Secondary | ICD-10-CM | POA: Diagnosis not present

## 2020-08-07 DIAGNOSIS — M5416 Radiculopathy, lumbar region: Secondary | ICD-10-CM | POA: Diagnosis not present

## 2020-08-07 DIAGNOSIS — M62830 Muscle spasm of back: Secondary | ICD-10-CM | POA: Diagnosis not present

## 2020-08-07 DIAGNOSIS — M5136 Other intervertebral disc degeneration, lumbar region: Secondary | ICD-10-CM | POA: Diagnosis not present

## 2020-10-02 DIAGNOSIS — M5416 Radiculopathy, lumbar region: Secondary | ICD-10-CM | POA: Diagnosis not present

## 2020-10-02 DIAGNOSIS — M48062 Spinal stenosis, lumbar region with neurogenic claudication: Secondary | ICD-10-CM | POA: Diagnosis not present

## 2020-10-02 DIAGNOSIS — M5136 Other intervertebral disc degeneration, lumbar region: Secondary | ICD-10-CM | POA: Diagnosis not present

## 2020-10-02 DIAGNOSIS — M62830 Muscle spasm of back: Secondary | ICD-10-CM | POA: Diagnosis not present

## 2020-11-13 ENCOUNTER — Other Ambulatory Visit
Admission: RE | Admit: 2020-11-13 | Discharge: 2020-11-13 | Disposition: A | Discharge status: Home / Self Care | Payer: PPO | Source: Ambulatory Visit | Attending: Internal Medicine | Admitting: Internal Medicine

## 2020-11-13 DIAGNOSIS — I509 Heart failure, unspecified: Secondary | ICD-10-CM | POA: Diagnosis not present

## 2020-11-13 DIAGNOSIS — I5022 Chronic systolic (congestive) heart failure: Secondary | ICD-10-CM | POA: Insufficient documentation

## 2020-11-13 DIAGNOSIS — I480 Paroxysmal atrial fibrillation: Secondary | ICD-10-CM | POA: Diagnosis not present

## 2020-11-13 DIAGNOSIS — R0600 Dyspnea, unspecified: Secondary | ICD-10-CM | POA: Diagnosis not present

## 2020-11-13 DIAGNOSIS — Z794 Long term (current) use of insulin: Secondary | ICD-10-CM | POA: Diagnosis not present

## 2020-11-13 DIAGNOSIS — D649 Anemia, unspecified: Secondary | ICD-10-CM | POA: Diagnosis not present

## 2020-11-13 DIAGNOSIS — E1122 Type 2 diabetes mellitus with diabetic chronic kidney disease: Secondary | ICD-10-CM | POA: Diagnosis not present

## 2020-11-13 DIAGNOSIS — R600 Localized edema: Secondary | ICD-10-CM | POA: Diagnosis not present

## 2020-11-13 DIAGNOSIS — N184 Chronic kidney disease, stage 4 (severe): Secondary | ICD-10-CM | POA: Diagnosis not present

## 2020-11-13 LAB — TROPONIN I (HIGH SENSITIVITY): Troponin I (High Sensitivity): 23 ng/L — ABNORMAL HIGH (ref <18)

## 2020-11-14 ENCOUNTER — Other Ambulatory Visit: Payer: Self-pay | Admitting: Internal Medicine

## 2020-11-14 DIAGNOSIS — N179 Acute kidney failure, unspecified: Secondary | ICD-10-CM

## 2020-11-15 DIAGNOSIS — R0600 Dyspnea, unspecified: Secondary | ICD-10-CM | POA: Diagnosis not present

## 2020-11-15 DIAGNOSIS — I5022 Chronic systolic (congestive) heart failure: Secondary | ICD-10-CM | POA: Diagnosis not present

## 2020-11-15 DIAGNOSIS — R600 Localized edema: Secondary | ICD-10-CM | POA: Diagnosis not present

## 2020-11-18 DIAGNOSIS — E1122 Type 2 diabetes mellitus with diabetic chronic kidney disease: Secondary | ICD-10-CM | POA: Diagnosis not present

## 2020-11-18 DIAGNOSIS — I5022 Chronic systolic (congestive) heart failure: Secondary | ICD-10-CM | POA: Diagnosis not present

## 2020-11-18 DIAGNOSIS — N183 Chronic kidney disease, stage 3 unspecified: Secondary | ICD-10-CM | POA: Diagnosis not present

## 2020-11-18 DIAGNOSIS — N184 Chronic kidney disease, stage 4 (severe): Secondary | ICD-10-CM | POA: Diagnosis not present

## 2020-11-18 DIAGNOSIS — Z794 Long term (current) use of insulin: Secondary | ICD-10-CM | POA: Diagnosis not present

## 2020-11-18 DIAGNOSIS — I10 Essential (primary) hypertension: Secondary | ICD-10-CM | POA: Diagnosis not present

## 2020-11-25 ENCOUNTER — Ambulatory Visit
Admission: RE | Admit: 2020-11-25 | Discharge: 2020-11-25 | Disposition: A | Discharge status: Home / Self Care | Payer: PPO | Source: Ambulatory Visit | Attending: Internal Medicine | Admitting: Internal Medicine

## 2020-11-25 ENCOUNTER — Other Ambulatory Visit: Payer: Self-pay

## 2020-11-25 DIAGNOSIS — N179 Acute kidney failure, unspecified: Secondary | ICD-10-CM | POA: Insufficient documentation

## 2020-12-02 ENCOUNTER — Ambulatory Visit
Admission: RE | Admit: 2020-12-02 | Discharge: 2020-12-02 | Disposition: A | Discharge status: Home / Self Care | Payer: PPO | Source: Ambulatory Visit | Attending: Urology | Admitting: Urology

## 2020-12-02 ENCOUNTER — Other Ambulatory Visit: Payer: Self-pay | Admitting: Urology

## 2020-12-02 ENCOUNTER — Telehealth: Payer: Self-pay

## 2020-12-02 ENCOUNTER — Other Ambulatory Visit: Payer: Self-pay

## 2020-12-02 ENCOUNTER — Ambulatory Visit: Payer: PPO | Admitting: Urology

## 2020-12-02 ENCOUNTER — Encounter: Payer: Self-pay | Admitting: Urology

## 2020-12-02 VITALS — BP 157/80 | HR 86 | Ht 62.0 in

## 2020-12-02 DIAGNOSIS — N179 Acute kidney failure, unspecified: Secondary | ICD-10-CM

## 2020-12-02 DIAGNOSIS — Z01818 Encounter for other preprocedural examination: Secondary | ICD-10-CM

## 2020-12-02 DIAGNOSIS — N1330 Unspecified hydronephrosis: Secondary | ICD-10-CM | POA: Insufficient documentation

## 2020-12-02 DIAGNOSIS — N281 Cyst of kidney, acquired: Secondary | ICD-10-CM | POA: Diagnosis not present

## 2020-12-02 DIAGNOSIS — H353131 Nonexudative age-related macular degeneration, bilateral, early dry stage: Secondary | ICD-10-CM | POA: Diagnosis not present

## 2020-12-02 DIAGNOSIS — N132 Hydronephrosis with renal and ureteral calculous obstruction: Secondary | ICD-10-CM | POA: Diagnosis not present

## 2020-12-02 DIAGNOSIS — R3989 Other symptoms and signs involving the genitourinary system: Secondary | ICD-10-CM

## 2020-12-02 LAB — URINALYSIS, COMPLETE
Bilirubin, UA: NEGATIVE
Glucose, UA: NEGATIVE
Ketones, UA: NEGATIVE
Nitrite, UA: POSITIVE — AB
Specific Gravity, UA: 1.015 (ref 1.005–1.030)
Urobilinogen, Ur: 0.2 mg/dL (ref 0.2–1.0)
pH, UA: 5.5 (ref 5.0–7.5)

## 2020-12-02 LAB — MICROSCOPIC EXAMINATION: WBC, UA: >30 /hpf — AB (ref 0–5)

## 2020-12-02 MED ORDER — CIPROFLOXACIN HCL 500 MG PO TABS: 500.0000 mg | ORAL_TABLET | Freq: Two times a day (BID) | ORAL | 0 refills | Status: DC

## 2020-12-02 NOTE — Progress Notes (Signed)
 12/02/20 4:32 PM   Devin Ann Chumney 08/24/1933 [id number]  CC: Left hydronephrosis, AKI  HPI: I saw Ms. Sax in urology clinic today for evaluation of left hydronephrosis.  Her husband is a retired internal medicine doc, and is with her today.  She has CKD, and was recently found to have worsening renal function with creatinine of 2.9(EGFR 15) from a baseline creatinine of 1.8(EGFR 27).  A renal ultrasound was performed on 11/26/2020 and showed moderate left hydronephrosis with no visible stone.  She denies any flank pain or history of kidney stones.  She denies any gross hematuria or smoking history.  She denies any urinary symptoms of dysuria, urgency, frequency.  She denies any fevers or chills.  A renal ultrasound in 2019 showed no evidence of hydronephrosis.  I ordered a CT prior to our visit today to evaluate for stone.  I personally viewed and interpreted the CT that shows moderate left hydroureteronephrosis all the way down to the bladder, with no calculus or visible mass.  Urinalysis concerning for infection today.  Likely asymptomatic bacteriuria she is completely asymptomatic at this tim, but will need to sterilize before surgery.  Will send for culture.  PMH: Past Medical History:  Diagnosis Date  . A-fib ([facility])   . Anemia   . Arthritis   . Cataracts, both eyes   . Chickenpox   . Chronic kidney disease   . Complication of anesthesia   . Diabetes mellitus without complication ([facility])   . Dyspnea    with exertion  . Dysrhythmia   . Hyperlipidemia   . Hypertension   . PONV (postoperative nausea and vomiting)    with 1st pregnancy 50 years ago and no problem since then  . Right leg weakness   . Spinal stenosis   . UTI (urinary tract infection)     Surgical History: Past Surgical History:  Procedure Laterality Date  . BACK SURGERY     x2 -cervical fusion and mid-lower back surgery  . CARDIOVERSION    . CATARACT EXTRACTION, BILATERAL    . CERVICAL LAMINECTOMY     . ELECTROPHYSIOLOGIC STUDY N/A 01/12/2017   Procedure: Cardioversion;  Surgeon: Kenneth A Fath, MD;  Location: [facility] ORS;  Service: Cardiovascular;  Laterality: N/A;  . SHOULDER SURGERY    . TOTAL KNEE ARTHROPLASTY Left 05/23/2019   Procedure: TOTAL KNEE ARTHROPLASTY - LEFT - DIABETIC;  Surgeon: Poggi, John J, MD;  Location: [facility] ORS;  Service: Orthopedics;  Laterality: Left;    Family History: Family History  Adopted: Yes    Social History:  reports that she has never smoked. She has never used smokeless tobacco. She reports that she does not drink alcohol and does not use drugs.  Physical Exam: BP (!) 157/80 (BP Location: Left Wrist, Patient Position: Sitting, Cuff Size: Normal)   Pulse 86   Ht 5' 2" (1.575 m)   BMI 37.49 kg/m    Constitutional:  Alert and oriented, No acute distress. Cardiovascular: No clubbing, cyanosis, or edema. Respiratory: Normal respiratory effort, no increased work of breathing. GI: Abdomen is soft, nontender, nondistended, no abdominal masses   Laboratory Data: Reviewed, see HPI  Pertinent Imaging: I have personally viewed and interpreted the renal ultrasound and CT.  Left hydronephrosis all the way down to the bladder with no definitive calculus or mass seen as etiology of obstruction.  Assessment & Plan:   In summary she is an 87-year-old comorbid female with a history of CKD who was recently found to have   new left-sided hydronephrosis and worsening renal function.  Her CT today shows moderate left hydroureteronephrosis all the way down to the bladder with no evidence of stone or mass as etiology of obstruction.  We discussed possible etiologies at length including stricture, mass, or reflux.  I recommended diagnostic left ureteroscopy, laser lithotripsy, and stent placement in the setting of her worsening renal function.  Risks and benefits were discussed at length.  We specifically discussed the risks ureteroscopy including bleeding,  infection/sepsis, stent related symptoms including flank pain/urgency/frequency/incontinence/dysuria, ureteral injury, inability to access ureter, or need for staged or additional procedures.  Will need clearance from cardiology to hold anticoagulation in setting of possible biopsy Schedule left diagnostic ureteroscopy, possible biopsy, left ureteral stent placement this Friday Cipro 500 mg twice daily for bacteria in urine, follow-up culture   Tyann Niehaus, MD 12/02/2020  [facility] [address], [address] [place], [place] [address] [phone]   

## 2020-12-02 NOTE — Telephone Encounter (Signed)
-----   Message from Billey Co, MD sent at 12/02/2020  1:32 PM EST ----- Regarding: abx for uti Please start her on Cipro 500 mg twice daily for 5 days for bacteria in urine, need to sterilize urine before surgery this Friday  Brian Sninsky, MD 12/02/2020

## 2020-12-02 NOTE — H&P (View-Only) (Signed)
12/02/20 4:32 PM   Tiffany [name] 1933/05/17 [id number]  CC: Left hydronephrosis, AKI  HPI: I saw [name] in urology clinic today for evaluation of left hydronephrosis.  Her husband is a retired Tour manager, and is with her today.  She has CKD, and was recently found to have worsening renal function with creatinine of 2.9(EGFR 15) from a baseline creatinine of 1.8(EGFR 27).  A renal ultrasound was performed on 11/26/2020 and showed moderate left hydronephrosis with no visible stone.  She denies any flank pain or history of kidney stones.  She denies any gross hematuria or smoking history.  She denies any urinary symptoms of dysuria, urgency, frequency.  She denies any fevers or chills.  A renal ultrasound in 2019 showed no evidence of hydronephrosis.  I ordered a CT prior to our visit today to evaluate for stone.  I personally viewed and interpreted the CT that shows moderate left hydroureteronephrosis all the way down to the bladder, with no calculus or visible mass.  Urinalysis concerning for infection today.  Likely asymptomatic bacteriuria she is completely asymptomatic at this tim, but will need to sterilize before surgery.  Will send for culture.  PMH: Past Medical History:  Diagnosis Date  . A-fib ([place])   . Anemia   . Arthritis   . Cataracts, both eyes   . Chickenpox   . Chronic kidney disease   . Complication of anesthesia   . Diabetes mellitus without complication ([place])   . Dyspnea    with exertion  . Dysrhythmia   . Hyperlipidemia   . Hypertension   . PONV (postoperative nausea and vomiting)    with 1st pregnancy 50 years ago and no problem since then  . Right leg weakness   . Spinal stenosis   . UTI (urinary tract infection)     Surgical History: Past Surgical History:  Procedure Laterality Date  . BACK SURGERY     x2 -cervical fusion and mid-lower back surgery  . CARDIOVERSION    . CATARACT EXTRACTION, BILATERAL    . CERVICAL LAMINECTOMY     . ELECTROPHYSIOLOGIC STUDY N/A 01/12/2017   Procedure: Cardioversion;  Surgeon: Teodoro Spray, MD;  Location: [facility] ORS;  Service: Cardiovascular;  Laterality: N/A;  . SHOULDER SURGERY    . TOTAL KNEE ARTHROPLASTY Left 05/23/2019   Procedure: TOTAL KNEE ARTHROPLASTY - LEFT - DIABETIC;  Surgeon: Corky Mull, MD;  Location: [facility] ORS;  Service: Orthopedics;  Laterality: Left;    Family History: Family History  Adopted: Yes    Social History:  reports that she has never smoked. She has never used smokeless tobacco. She reports that she does not drink alcohol and does not use drugs.  Physical Exam: BP (!) 157/80 (BP Location: Left Wrist, Patient Position: Sitting, Cuff Size: Normal)   Pulse 86   Ht 5' 2" (1.575 m)   BMI 37.49 kg/m    Constitutional:  Alert and oriented, No acute distress. Cardiovascular: No clubbing, cyanosis, or edema. Respiratory: Normal respiratory effort, no increased work of breathing. GI: Abdomen is soft, nontender, nondistended, no abdominal masses   Laboratory Data: Reviewed, see HPI  Pertinent Imaging: I have personally viewed and interpreted the renal ultrasound and CT.  Left hydronephrosis all the way down to the bladder with no definitive calculus or mass seen as etiology of obstruction.  Assessment & Plan:   In summary she is an 86 year old comorbid female with a history of CKD who was recently found to have  new left-sided hydronephrosis and worsening renal function.  Her CT today shows moderate left hydroureteronephrosis all the way down to the bladder with no evidence of stone or mass as etiology of obstruction.  We discussed possible etiologies at length including stricture, mass, or reflux.  I recommended diagnostic left ureteroscopy, laser lithotripsy, and stent placement in the setting of her worsening renal function.  Risks and benefits were discussed at length.  We specifically discussed the risks ureteroscopy including bleeding,  infection/sepsis, stent related symptoms including flank pain/urgency/frequency/incontinence/dysuria, ureteral injury, inability to access ureter, or need for staged or additional procedures.  Will need clearance from cardiology to hold anticoagulation in setting of possible biopsy Schedule left diagnostic ureteroscopy, possible biopsy, left ureteral stent placement this Friday Cipro 500 mg twice daily for bacteria in urine, follow-up culture   Tiffany Madrid, MD 12/02/2020  [facility] [address] [place], [place] [address] [phone]

## 2020-12-02 NOTE — Patient Instructions (Signed)
Ureteroscopy Ureteroscopy is a procedure to check for and treat problems inside part of the urinary tract. In this procedure, a thin, tube-shaped instrument with a light at the end (ureteroscope) is used to look at the inside of the kidneys and the ureters, which are the tubes that carry urine from the kidneys to the bladder. The ureteroscope is inserted into one or both of the ureters. You may need this procedure if you have frequent urinary tract infections (UTIs), blood in your urine, or a stone in one of your ureters. A ureteroscopy can be done to find the cause of urine blockage in a ureter and to evaluate other abnormalities inside the ureters or kidneys. If stones are found, they can be removed during the procedure. Polyps, abnormal tissue, and some types of tumors can also be removed or treated. The ureteroscope may also have a tool to remove tissue to be checked for disease under a microscope (biopsy). Tell a health care provider about:  Any allergies you have.  All medicines you are taking, including vitamins, herbs, eye drops, creams, and over-the-counter medicines.  Any problems you or family members have had with anesthetic medicines.  Any blood disorders you have.  Any surgeries you have had.  Any medical conditions you have.  Whether you are pregnant or may be pregnant. What are the risks? Generally, this is a safe procedure. However, problems may occur, including:  Bleeding.  Infection.  Allergic reactions to medicines.  Scarring that narrows the ureter (stricture).  Creating a hole in the ureter (perforation). What happens before the procedure? Staying hydrated Follow instructions from your health care provider about hydration, which may include:  Up to 2 hours before the procedure - you may continue to drink clear liquids, such as water, clear fruit juice, black coffee, and plain tea. Eating and drinking restrictions Follow instructions from your health care  provider about eating and drinking, which may include:  8 hours before the procedure - stop eating heavy meals or foods such as meat, fried foods, or fatty foods.  6 hours before the procedure - stop eating light meals or foods, such as toast or cereal.  6 hours before the procedure - stop drinking milk or drinks that contain milk.  2 hours before the procedure - stop drinking clear liquids. Medicines  Ask your health care provider about: ? Changing or stopping your regular medicines. This is especially important if you are taking diabetes medicines or blood thinners. ? Taking medicines such as aspirin and ibuprofen. These medicines can thin your blood. Do not take these medicines before your procedure if your health care provider instructs you not to.  You may be given antibiotic medicine to help prevent infection. General instructions  You may have a urine sample taken to check for infection.  Plan to have someone take you home from the hospital or clinic. What happens during the procedure?   To reduce your risk of infection: ? Your health care team will wash or sanitize their hands. ? Your skin will be washed with soap.  An IV tube will be inserted into one of your veins.  You will be given one of the following: ? A medicine to help you relax (sedative). ? A medicine to make you fall asleep (general anesthetic). ? A medicine that is injected into your spine to numb the area below and slightly above the injection site (spinal anesthetic).  To lower your risk of infection, you may be given an antibiotic medicine   by an injection or through the IV tube.  The opening from which you urinate (urethra) will be cleaned with a germ-killing solution.  The ureteroscope will be passed through your urethra into your bladder.  A salt-water solution will flow through the ureteroscope to fill your bladder. This will help the health care provider see the openings of your ureters more  clearly.  Then, the ureteroscope will be passed into your ureter. ? If a growth is found, a piece of it may be removed so it can be examined under a microscope (biopsy). ? If a stone is found, it may be removed through the ureteroscope, or the stone may be broken up using a laser, shock waves, or electrical energy. ? In some cases, if the ureter is too small, a tube may be inserted that keeps the ureter open (ureteral stent). The stent may be left in place for 1 or 2 weeks to keep the ureter open, and then the ureteroscopy procedure will be performed.  The scope will be removed, and your bladder will be emptied. The procedure may vary among health care providers and hospitals. What happens after the procedure?  Your blood pressure, heart rate, breathing rate, and blood oxygen level will be monitored until the medicines you were given have worn off.  You may be asked to urinate.  Donot drive for 24 hours if you were given a sedative. This information is not intended to replace advice given to you by your health care provider. Make sure you discuss any questions you have with your health care provider. Document Revised: 11/26/2017 Document Reviewed: 09/25/2016 Elsevier Patient Education  2020 [employer]   Ureteral Stent Implantation  Ureteral stent implantation is a procedure to insert (implant) a flexible, soft, plastic tube (stent) into a ureter. Ureters are the tube-like parts of the body that drain urine from the kidneys. The stent supports the ureter while it heals and helps to drain urine. You may have a ureteral stent implanted after having a procedure to remove a blockage from the ureter (ureterolysis or pyeloplasty). You may also have a stent implanted to open the flow of urine when you have a blockage caused by a kidney stone, tumor, blood clot, or infection. You have two ureters, one on each side of the body. The ureters connect the kidneys to the organ that holds urine until it  passes out of the body (bladder). The stent is placed so that one end is in the kidney, and one end is in the bladder. The stent is usually taken out after your ureter has healed. Depending on your condition, you may have a stent for just a few weeks, or you may have a long-term stent that will need to be replaced every few months. Tell a health care provider about:  Any allergies you have.  All medicines you are taking, including vitamins, herbs, eye drops, creams, and over-the-counter medicines.  Any problems you or family members have had with anesthetic medicines.  Any blood disorders you have.  Any surgeries you have had.  Any medical conditions you have.  Whether you are pregnant or may be pregnant. What are the risks? Generally, this is a safe procedure. However, problems may occur, including:  Infection.  Bleeding.  Allergic reactions to medicines.  Damage to other structures or organs. Tearing (perforation) of the ureter is possible.  Movement of the stent away from where it is placed during surgery (migration). What happens before the procedure? Medicines Ask your health   care provider about:  Changing or stopping your regular medicines. This is especially important if you are taking diabetes medicines or blood thinners.  Taking medicines such as aspirin and ibuprofen. These medicines can thin your blood. Do not take these medicines unless your health care provider tells you to take them.  Taking over-the-counter medicines, vitamins, herbs, and supplements. Eating and drinking Follow instructions from your health care provider about eating and drinking, which may include:  8 hours before the procedure - stop eating heavy meals or foods, such as meat, fried foods, or fatty foods.  6 hours before the procedure - stop eating light meals or foods, such as toast or cereal.  6 hours before the procedure - stop drinking milk or drinks that contain milk.  2 hours before  the procedure - stop drinking clear liquids. Staying hydrated Follow instructions from your health care provider about hydration, which may include:  Up to 2 hours before the procedure - you may continue to drink clear liquids, such as water, clear fruit juice, black coffee, and plain tea. General instructions  Do not drink alcohol.  Do not use any products that contain nicotine or tobacco for at least 4 weeks before the procedure. These products include cigarettes, e-cigarettes, and chewing tobacco. If you need help quitting, ask your health care provider.  You may have an exam or testing, such as imaging or blood tests.  Ask your health care provider what steps will be taken to help prevent infection. These may include: ? Removing hair at the surgery site. ? Washing skin with a germ-killing soap. ? Taking antibiotic medicine.  Plan to have someone take you home from the hospital or clinic.  If you will be going home right after the procedure, plan to have someone with you for 24 hours. What happens during the procedure?  An IV will be inserted into one of your veins.  You may be given a medicine to help you relax (sedative).  You may be given a medicine to make you fall asleep (general anesthetic).  A thin, tube-shaped instrument with a light and tiny camera at the end (cystoscope) will be inserted into your urethra. The urethra is the tube that drains urine from the bladder out of the body. In men, the urethra opens at the end of the penis. In women, the urethra opens in front of the vaginal opening.  The cystoscope will be passed into your bladder.  A thin wire (guide wire) will be passed through your bladder and into your ureter. This is used to guide the stent into your ureter.  The stent will be inserted into your ureter.  The guide wire and the cystoscope will be removed.  A flexible tube (catheter) may be inserted through your urethra so that one end is in your  bladder. This helps to drain urine from your bladder. The procedure may vary among hospitals and health care providers. What happens after the procedure?  Your blood pressure, heart rate, breathing rate, and blood oxygen level will be monitored until you leave the hospital or clinic.  You may continue to receive medicine and fluids through an IV.  You may have some soreness or pain in your abdomen and urethra. Medicines will be available to help you.  You will be encouraged to get up and walk around as soon as you can.  You may have a catheter draining your urine.  You will have some blood in your urine.  Do not drive   for 24 hours if you were given a sedative during your procedure. Summary  Ureteral stent implantation is a procedure to insert a flexible, soft, plastic tube (stent) into a ureter.  You may have a stent implanted to support the ureter while it heals after a procedure or to open the flow of urine if there is a blockage.  Follow instructions from your health care provider about taking medicines and about eating and drinking before the procedure.  Depending on your condition, you may have a stent for just a few weeks, or you may have a long-term stent that will need to be replaced every few months. This information is not intended to replace advice given to you by your health care provider. Make sure you discuss any questions you have with your health care provider. Document Revised: 09/20/2018 Document Reviewed: 09/21/2018 Elsevier Patient Education  2020 Elsevier Inc.  

## 2020-12-02 NOTE — Telephone Encounter (Signed)
Called pt informed her of the information below. Pt gave verbal understanding. RX sent.  

## 2020-12-04 ENCOUNTER — Other Ambulatory Visit: Payer: Self-pay

## 2020-12-04 ENCOUNTER — Other Ambulatory Visit
Admission: RE | Admit: 2020-12-04 | Discharge: 2020-12-04 | Disposition: A | Discharge status: Home / Self Care | Payer: PPO | Source: Ambulatory Visit | Attending: Urology | Admitting: Urology

## 2020-12-04 DIAGNOSIS — Z01812 Encounter for preprocedural laboratory examination: Secondary | ICD-10-CM | POA: Insufficient documentation

## 2020-12-04 DIAGNOSIS — Z20822 Contact with and (suspected) exposure to covid-19: Secondary | ICD-10-CM | POA: Insufficient documentation

## 2020-12-04 LAB — SARS CORONAVIRUS 2 (TAT 6-24 HRS): SARS Coronavirus 2: NEGATIVE

## 2020-12-05 ENCOUNTER — Other Ambulatory Visit: Payer: Self-pay | Admitting: Urology

## 2020-12-05 ENCOUNTER — Encounter
Admission: RE | Admit: 2020-12-05 | Discharge: 2020-12-05 | Disposition: A | Discharge status: Home / Self Care | Payer: PPO | Source: Ambulatory Visit | Attending: Urology | Admitting: Urology

## 2020-12-05 ENCOUNTER — Telehealth: Payer: Self-pay

## 2020-12-05 DIAGNOSIS — N1330 Unspecified hydronephrosis: Secondary | ICD-10-CM

## 2020-12-05 DIAGNOSIS — R3989 Other symptoms and signs involving the genitourinary system: Secondary | ICD-10-CM

## 2020-12-05 LAB — CULTURE, URINE COMPREHENSIVE

## 2020-12-05 MED ORDER — SULFAMETHOXAZOLE-TRIMETHOPRIM 800-160 MG PO TABS: 1.0000 | ORAL_TABLET | Freq: Two times a day (BID) | ORAL | 0 refills | Status: AC

## 2020-12-05 NOTE — Patient Instructions (Addendum)
Your procedure is scheduled on: Friday, December 10 Report to the Registration Desk on the 1st floor of the [facility]. To find out your arrival time, please call [phone] between 1PM - 3PM on: Thursday, December 9  REMEMBER: Instructions that are not followed completely may result in serious medical risk, up to and including death; or upon the discretion of your surgeon and anesthesiologist your surgery may need to be rescheduled.  Do not eat food after midnight the night before surgery.  No gum chewing, lozengers or hard candies.  You may however, drink water up to 2 hours before you are scheduled to arrive for your surgery. Do not drink anything within 2 hours of your scheduled arrival time.  TAKE THESE MEDICATIONS THE MORNING OF SURGERY WITH A SIP OF WATER:  1.  proair inhaler 2.  Metoprolol  Use inhalers on the day of surgery and bring to the hospital.  No insulin on the morning of surgery.   Follow recommendations from Cardiologist regarding stopping Aspirin and xarelto.  One week prior to surgery: Stop Anti-inflammatories (NSAIDS) such as Advil, Aleve, Ibuprofen, Motrin, Naproxen, Naprosyn and Aspirin based products such as Excedrin, Goodys Powder, BC Powder. Stop ANY OVER THE COUNTER supplements until after surgery.  No Alcohol for 24 hours before or after surgery.  On the morning of surgery brush your teeth with toothpaste and water, you may rinse your mouth with mouthwash if you wish. Do not swallow any toothpaste or mouthwash.  Do not wear jewelry, make-up, hairpins, clips or nail polish.  Do not wear lotions, powders, or perfumes.   Do not bring valuables to the hospital. [facility] is not responsible for any missing/lost belongings or valuables.   Notify your doctor if there is any change in your medical condition (cold, fever, infection).  Wear comfortable clothing (specific to your surgery type) to the hospital.  If you are being discharged the day of  surgery, you will not be allowed to drive home. You will need a responsible adult (18 years or older) to drive you home and stay with you that night.   Please call the [facility] at [phone] if you have any questions about these instructions.  Visitation Policy:  Patients undergoing a surgery or procedure may have one family member or support person with them as long as that person is not COVID-19 positive or experiencing its symptoms.  That person may remain in the waiting area during the procedure.

## 2020-12-05 NOTE — Telephone Encounter (Signed)
Called pt informed her of the information below. Pt gave verbal understanding. RX sent.  

## 2020-12-05 NOTE — Telephone Encounter (Signed)
-----   Message from Billey Co, MD sent at 12/05/2020  4:51 PM EST ----- E. coli was resistant to Cipro, please start Bactrim DS twice daily x5 days.  She should start this medication tonight, and take another dose tomorrow morning before she comes to the hospital for surgery  Thanks Nickolas Madrid, MD 12/05/2020

## 2020-12-06 ENCOUNTER — Other Ambulatory Visit: Payer: Self-pay

## 2020-12-06 ENCOUNTER — Ambulatory Visit: Payer: PPO

## 2020-12-06 ENCOUNTER — Encounter
Admission: RE | Disposition: A | Discharge status: Home / Self Care | Payer: Self-pay | Source: Home / Self Care | Attending: Urology

## 2020-12-06 ENCOUNTER — Ambulatory Visit: Payer: PPO | Admitting: Certified Registered"

## 2020-12-06 ENCOUNTER — Ambulatory Visit
Admission: RE | Admit: 2020-12-06 | Discharge: 2020-12-06 | Disposition: A | Discharge status: Home / Self Care | Payer: PPO | Attending: Urology | Admitting: Urology

## 2020-12-06 ENCOUNTER — Encounter: Payer: Self-pay | Admitting: Urology

## 2020-12-06 DIAGNOSIS — I129 Hypertensive chronic kidney disease with stage 1 through stage 4 chronic kidney disease, or unspecified chronic kidney disease: Secondary | ICD-10-CM | POA: Diagnosis not present

## 2020-12-06 DIAGNOSIS — I4891 Unspecified atrial fibrillation: Secondary | ICD-10-CM | POA: Diagnosis not present

## 2020-12-06 DIAGNOSIS — H269 Unspecified cataract: Secondary | ICD-10-CM | POA: Diagnosis not present

## 2020-12-06 DIAGNOSIS — N1330 Unspecified hydronephrosis: Secondary | ICD-10-CM | POA: Insufficient documentation

## 2020-12-06 DIAGNOSIS — N179 Acute kidney failure, unspecified: Secondary | ICD-10-CM | POA: Diagnosis not present

## 2020-12-06 DIAGNOSIS — N189 Chronic kidney disease, unspecified: Secondary | ICD-10-CM | POA: Diagnosis not present

## 2020-12-06 HISTORY — PX: URETEROSCOPY: SHX842

## 2020-12-06 HISTORY — PX: CYSTOSCOPY WITH STENT PLACEMENT: SHX5790

## 2020-12-06 HISTORY — PX: CYSTOSCOPY W/ RETROGRADES: SHX1426

## 2020-12-06 LAB — POCT I-STAT, CHEM 8
BUN: 33 mg/dL — ABNORMAL HIGH (ref 8–23)
Calcium, Ion: 1.18 mmol/L (ref 1.15–1.40)
Chloride: 109 mmol/L (ref 98–111)
Creatinine, Ser: 2.2 mg/dL — ABNORMAL HIGH (ref 0.44–1.00)
Glucose, Bld: 136 mg/dL — ABNORMAL HIGH (ref 70–99)
HCT: 34 % — ABNORMAL LOW (ref 36.0–46.0)
Hemoglobin: 11.6 g/dL — ABNORMAL LOW (ref 12.0–15.0)
Potassium: 4.3 mmol/L (ref 3.5–5.1)
Sodium: 138 mmol/L (ref 135–145)
TCO2: 20 mmol/L — ABNORMAL LOW (ref 22–32)

## 2020-12-06 LAB — GLUCOSE, CAPILLARY: Glucose-Capillary: 122 mg/dL — ABNORMAL HIGH (ref 70–99)

## 2020-12-06 SURGERY — CYSTOSCOPY, WITH STENT INSERTION
Anesthesia: General

## 2020-12-06 MED ORDER — CEFAZOLIN SODIUM-DEXTROSE 2-4 GM/100ML-% IV SOLN
2.0000 g | INTRAVENOUS | Status: AC
  Administered 2020-12-06: 2 g via INTRAVENOUS

## 2020-12-06 MED ORDER — FENTANYL CITRATE (PF) 100 MCG/2ML IJ SOLN
INTRAMUSCULAR | Status: DC | PRN
  Administered 2020-12-06: 100 ug via INTRAVENOUS

## 2020-12-06 MED ORDER — PHENYLEPHRINE HCL (PRESSORS) 10 MG/ML IV SOLN
INTRAVENOUS | Status: DC | PRN
  Administered 2020-12-06 (×2): 200 ug via INTRAVENOUS

## 2020-12-06 MED ORDER — DEXAMETHASONE SODIUM PHOSPHATE 10 MG/ML IJ SOLN
INTRAMUSCULAR | Status: AC
  Filled 2020-12-06: qty 1

## 2020-12-06 MED ORDER — ACETAMINOPHEN 10 MG/ML IV SOLN
INTRAVENOUS | Status: DC | PRN
  Administered 2020-12-06: 1000 mg via INTRAVENOUS

## 2020-12-06 MED ORDER — DEXAMETHASONE SODIUM PHOSPHATE 10 MG/ML IJ SOLN
INTRAMUSCULAR | Status: DC | PRN
  Administered 2020-12-06: 10 mg via INTRAVENOUS

## 2020-12-06 MED ORDER — LIDOCAINE HCL (PF) 2 % IJ SOLN
INTRAMUSCULAR | Status: AC
  Filled 2020-12-06: qty 5

## 2020-12-06 MED ORDER — ARTIFICIAL TEARS OPHTHALMIC OINT
TOPICAL_OINTMENT | OPHTHALMIC | Status: AC
  Filled 2020-12-06: qty 3.5

## 2020-12-06 MED ORDER — SUCCINYLCHOLINE CHLORIDE 200 MG/10ML IV SOSY
PREFILLED_SYRINGE | INTRAVENOUS | Status: AC
  Filled 2020-12-06: qty 10

## 2020-12-06 MED ORDER — ONDANSETRON HCL 4 MG/2ML IJ SOLN
INTRAMUSCULAR | Status: DC | PRN
  Administered 2020-12-06: 4 mg via INTRAVENOUS

## 2020-12-06 MED ORDER — SODIUM CHLORIDE 0.9 % IV SOLN: INTRAVENOUS | Status: DC

## 2020-12-06 MED ORDER — CEFAZOLIN SODIUM-DEXTROSE 2-4 GM/100ML-% IV SOLN
INTRAVENOUS | Status: AC
  Filled 2020-12-06: qty 100

## 2020-12-06 MED ORDER — ONDANSETRON HCL 4 MG/2ML IJ SOLN: 4.0000 mg | Freq: Once | INTRAMUSCULAR | Status: DC | PRN

## 2020-12-06 MED ORDER — ONDANSETRON HCL 4 MG/2ML IJ SOLN
INTRAMUSCULAR | Status: AC
  Filled 2020-12-06: qty 2

## 2020-12-06 MED ORDER — ORAL CARE MOUTH RINSE: 15.0000 mL | Freq: Once | OROMUCOSAL | Status: AC

## 2020-12-06 MED ORDER — SUCCINYLCHOLINE CHLORIDE 20 MG/ML IJ SOLN
INTRAMUSCULAR | Status: DC | PRN
  Administered 2020-12-06: 100 mg via INTRAVENOUS

## 2020-12-06 MED ORDER — CHLORHEXIDINE GLUCONATE 0.12 % MT SOLN: 15.0000 mL | Freq: Once | OROMUCOSAL | Status: AC

## 2020-12-06 MED ORDER — PROPOFOL 10 MG/ML IV BOLUS
INTRAVENOUS | Status: DC | PRN
  Administered 2020-12-06: 100 mg via INTRAVENOUS

## 2020-12-06 MED ORDER — FAMOTIDINE 20 MG PO TABS
ORAL_TABLET | ORAL | Status: AC
  Filled 2020-12-06: qty 1

## 2020-12-06 MED ORDER — ACETAMINOPHEN 10 MG/ML IV SOLN
INTRAVENOUS | Status: AC
  Filled 2020-12-06: qty 100

## 2020-12-06 MED ORDER — CHLORHEXIDINE GLUCONATE 0.12 % MT SOLN
OROMUCOSAL | Status: AC
  Administered 2020-12-06: 15 mL via OROMUCOSAL
  Filled 2020-12-06: qty 15

## 2020-12-06 MED ORDER — FENTANYL CITRATE (PF) 100 MCG/2ML IJ SOLN: 25.0000 ug | INTRAMUSCULAR | Status: DC | PRN

## 2020-12-06 MED ORDER — PROPOFOL 10 MG/ML IV BOLUS
INTRAVENOUS | Status: AC
  Filled 2020-12-06: qty 20

## 2020-12-06 MED ORDER — FENTANYL CITRATE (PF) 100 MCG/2ML IJ SOLN
INTRAMUSCULAR | Status: AC
  Filled 2020-12-06: qty 2

## 2020-12-06 MED ORDER — LIDOCAINE HCL (CARDIAC) PF 100 MG/5ML IV SOSY
PREFILLED_SYRINGE | INTRAVENOUS | Status: DC | PRN
  Administered 2020-12-06: 50 mg via INTRAVENOUS

## 2020-12-06 MED ORDER — IOHEXOL 180 MG/ML  SOLN
INTRAMUSCULAR | Status: DC | PRN
  Administered 2020-12-06: 20 mL

## 2020-12-06 MED ORDER — FAMOTIDINE 20 MG PO TABS
20.0000 mg | ORAL_TABLET | Freq: Once | ORAL | Status: AC
  Administered 2020-12-06: 20 mg via ORAL

## 2020-12-06 SURGICAL SUPPLY — 41 items
BAG DRAIN CYSTO-URO LG1000N (MISCELLANEOUS) ×5 IMPLANT
BRUSH SCRUB EZ  4% CHG (MISCELLANEOUS) ×2
BRUSH SCRUB EZ 1% IODOPHOR (MISCELLANEOUS) ×5 IMPLANT
BRUSH SCRUB EZ 4% CHG (MISCELLANEOUS) ×3 IMPLANT
BULB IRRIG PATHFIND (MISCELLANEOUS) IMPLANT
CATH URETL 5X70 OPEN END (CATHETERS) ×5 IMPLANT
CNTNR SPEC 2.5X3XGRAD LEK (MISCELLANEOUS) ×3
CONRAY 43 FOR UROLOGY 50M (MISCELLANEOUS) ×5 IMPLANT
CONT SPEC 4OZ STER OR WHT (MISCELLANEOUS) ×2
CONT SPEC 4OZ STRL OR WHT (MISCELLANEOUS) ×3
CONTAINER SPEC 2.5X3XGRAD LEK (MISCELLANEOUS) ×3 IMPLANT
DRSG TELFA 4X3 1S NADH ST (GAUZE/BANDAGES/DRESSINGS) ×5 IMPLANT
ELECT REM PT RETURN 9FT ADLT (ELECTROSURGICAL) ×5
ELECTRODE REM PT RTRN 9FT ADLT (ELECTROSURGICAL) ×3 IMPLANT
GLOVE BIOGEL PI IND STRL 7.5 (GLOVE) ×3 IMPLANT
GLOVE BIOGEL PI INDICATOR 7.5 (GLOVE) ×2
GOWN STRL REUS W/ TWL LRG LVL3 (GOWN DISPOSABLE) ×3 IMPLANT
GOWN STRL REUS W/ TWL XL LVL3 (GOWN DISPOSABLE) ×3 IMPLANT
GOWN STRL REUS W/TWL LRG LVL3 (GOWN DISPOSABLE) ×5
GOWN STRL REUS W/TWL XL LVL3 (GOWN DISPOSABLE) ×5
GUIDEWIRE GREEN .038 145CM (MISCELLANEOUS) ×5 IMPLANT
GUIDEWIRE STR DUAL SENSOR (WIRE) ×5 IMPLANT
INFUSOR MANOMETER BAG 3000ML (MISCELLANEOUS) ×5 IMPLANT
INTRODUCER DILATOR DOUBLE (INTRODUCER) IMPLANT
KIT TURNOVER CYSTO (KITS) ×5 IMPLANT
MANIFOLD NEPTUNE II (INSTRUMENTS) ×5 IMPLANT
PACK CYSTO AR (MISCELLANEOUS) ×5 IMPLANT
SET CYSTO W/LG BORE CLAMP LF (SET/KITS/TRAYS/PACK) ×5 IMPLANT
SHEATH URETERAL 12FRX35CM (MISCELLANEOUS) IMPLANT
SOL .9 NS 3000ML IRR  AL (IV SOLUTION) ×2
SOL .9 NS 3000ML IRR AL (IV SOLUTION) ×3
SOL .9 NS 3000ML IRR UROMATIC (IV SOLUTION) ×3 IMPLANT
STENT URET 6FRX24 CONTOUR (STENTS) ×5 IMPLANT
STENT URET 6FRX26 CONTOUR (STENTS) IMPLANT
SURGILUBE 2OZ TUBE FLIPTOP (MISCELLANEOUS) ×5 IMPLANT
SYR TOOMEY IRRIG 70ML (MISCELLANEOUS)
SYRINGE TOOMEY IRRIG 70ML (MISCELLANEOUS) IMPLANT
TUBING ART PRESS 48 MALE/FEM (TUBING) IMPLANT
VALVE UROSEAL ADJ ENDO (VALVE) ×5 IMPLANT
WATER STERILE IRR 1000ML POUR (IV SOLUTION) ×5 IMPLANT
WATER STERILE IRR 3000ML UROMA (IV SOLUTION) ×5 IMPLANT

## 2020-12-06 NOTE — Op Note (Signed)
Date of procedure: 12/06/20  Preoperative diagnosis:  1. Left hydroureteronephrosis  Postoperative diagnosis:  1. Same  Procedure: 1. Cystoscopy, left diagnostic ureteroscopy, left retrograde pyelogram with intraoperative interpretation, left ureteral stent placement  Surgeon: Nickolas Madrid, MD  Anesthesia: General  Complications: None  Intraoperative findings:  1.  Thickened and erythematous bladder, but no suspicious lesions 2.  Left ureteral orifice clearly identified, right difficult to identify but appeared to be more posterior 3.  No evidence of obstruction or tumors on left diagnostic ureteroscopy or retrograde pyelogram 4.  Uncomplicated left ureteral stent placement  EBL: None  Specimens: None  Drains: Left 6 French by 24 cm Bard Optima stent  Indication: Keimani Laufer is a 86 y.o. patient with asymptomatic left hydroureteronephrosis down to the bladder and AKI.  After reviewing the management options for treatment, they elected to proceed with the above surgical procedure(s). We have discussed the potential benefits and risks of the procedure, side effects of the proposed treatment, the likelihood of the patient achieving the goals of the procedure, and any potential problems that might occur during the procedure or recuperation. Informed consent has been obtained.  Description of procedure:  The patient was taken to the operating room and general anesthesia was induced. SCDs were placed for DVT prophylaxis. The patient was placed in the dorsal lithotomy position, prepped and draped in the usual sterile fashion, and preoperative antibiotics(Ancef) were administered. A preoperative time-out was performed.   A 21 French rigid cystoscope was used to intubate the urethra and thorough cystoscopy was performed.  There was some thickening of the bladder with mild to moderate trabeculations, and some subtle erythema at the dome, but no suspicious lesions or tumors.  The left  ureteral orifice was patulous and clearly identified, the right ureteral orifice was difficult to identify but appeared to be more posterior.  I started by performing a retrograde pyelogram on the left side which showed moderate hydroureteronephrosis all the way down to the bladder with no evidence of stricture or mass.  A wire was then passed into the left kidney under fluoroscopic vision, and the access catheter advanced over the wire and an additional retrograde showed moderate dilation of the kidney.    The semirigid ureteroscope advanced alongside the wire easily into the left ureteral orifice and a normal-appearing ureter was followed proximally all the way up into the bladder.  The ureter was widely patent from the bladder all the way to the kidney with no abnormalities.  Pressure in the scope was minimized with her recent history of UTI.  The rigid cystoscope was backloaded over the wire and a 6 [place] by 24 cm Bard Optima stent was uneventfully placed with a curl in the renal pelvis under fluoroscopic vision, as well as under direct vision in the bladder.  The bladder was drained and this concluded our procedure.  Disposition: Stable to PACU  Plan: Finish course of Bactrim for recent E. coli UTI Follow-up in 2 to 3 weeks in clinic with BMP to check renal function May require long-term yearly stent exchanges  Nickolas Madrid, MD

## 2020-12-06 NOTE — Interval H&P Note (Signed)
UROLOGY H&P UPDATE  Agree with prior H&P dated 12/02/2020.  86 year old female with new left hydroureteronephrosis down to the bladder and AKI of unclear etiology.  Cardiac: RRR Lungs: CTA bilaterally  Laterality: Left Procedure: Cystoscopy, left diagnostic ureteroscopy, possible biopsy, possible balloon dilation, ureteral stent placement  Urine: Urine culture 12/6 with asymptomatic E. coli, was changed to culture appropriate Bactrim yesterday  Informed consent obtained, we specifically discussed the risks of bleeding, infection, possible need for long-term stent exchanges, possible new diagnosis of malignancy, stent related symptoms including urgency/frequency/dysuria, post-operative pain, need for additional procedures.  Billey Co, MD 12/06/2020

## 2020-12-06 NOTE — OR Nursing (Signed)
Per OR 10 staff, patient may resume taking Xarelto tomorrow per Dr. Diamantina Providence.  Added to d/c intructions.  Patient/spouse aware of same.

## 2020-12-06 NOTE — Transfer of Care (Signed)
Immediate Anesthesia Transfer of Care Note  Patient: Tiffany [name]  Procedure(s) Performed: CYSTOSCOPY WITH STENT PLACEMENT (Left ) DIAGNOSTIC URETEROSCOPY (N/A ) CYSTOSCOPY WITH RETROGRADE PYELOGRAM  Patient Location: PACU  Anesthesia Type:General  Level of Consciousness: awake  Airway & Oxygen Therapy: Patient Spontanous Breathing and Patient connected to face mask oxygen  Post-op Assessment: Report given to RN  Post vital signs: Reviewed and stable  Last Vitals:  Vitals Value Taken Time  BP 163/84 12/06/20 0925  Temp    Pulse 77 12/06/20 0928  Resp 19 12/06/20 0930  SpO2 100 % 12/06/20 0928  Vitals shown include unvalidated device data.  Last Pain:  Vitals:   12/06/20 0741  TempSrc: Oral  PainSc: 0-No pain         Complications: No complications documented.

## 2020-12-06 NOTE — Anesthesia Procedure Notes (Signed)
Procedure Name: Intubation Performed by: Gaynelle Cage, CRNA Pre-anesthesia Checklist: Patient identified, Emergency Drugs available, Suction available and Patient being monitored Patient Re-evaluated:Patient Re-evaluated prior to induction Oxygen Delivery Method: Circle system utilized Preoxygenation: Pre-oxygenation with 100% oxygen Induction Type: IV induction and Cricoid Pressure applied Laryngoscope Size: Glidescope and 3 Grade View: Grade I Tube type: Oral Tube size: 7.0 mm Number of attempts: 1 Airway Equipment and Method: Stylet and Oral airway Placement Confirmation: ETT inserted through vocal cords under direct vision,  positive ETCO2 and breath sounds checked- equal and bilateral Secured at: 21 cm Tube secured with: Tape Dental Injury: Teeth and Oropharynx as per pre-operative assessment

## 2020-12-06 NOTE — Discharge Instructions (Addendum)
AMBULATORY SURGERY  DISCHARGE INSTRUCTIONS   1) The drugs that you were given will stay in your system until tomorrow so for the next 24 hours you should not:  A) Drive an automobile B) Make any legal decisions C) Drink any alcoholic beverage   2) You may resume regular meals tomorrow.  Today it is better to start with liquids and gradually work up to solid foods.  You may eat anything you prefer, but it is better to start with liquids, then soup and crackers, and gradually work up to solid foods.   3) Please notify your doctor immediately if you have any unusual bleeding, trouble breathing, redness and pain at the surgery site, drainage, fever, or pain not relieved by medication.    4) Additional Instructions:  MAY RESUME TAKING XARELTO TOMORROW 12/07/20 PER DR. Diamantina Providence.        Please contact your physician with any problems or Same Day Surgery at [phone], Monday through Friday 6 am to 4 pm, or [place] at [facility] number at [phone].

## 2020-12-06 NOTE — Anesthesia Preprocedure Evaluation (Addendum)
Anesthesia Evaluation  Patient identified by MRN, date of birth, ID band Patient awake    Reviewed: Allergy & Precautions, H&P , NPO status , Patient's Chart, lab work & pertinent test results  History of Anesthesia Complications (+) PONV and history of anesthetic complications  Airway Mallampati: III  TM Distance: <3 FB Neck ROM: full    Dental  (+) Teeth Intact, Chipped, Caps   Pulmonary shortness of breath and with exertion, neg COPD, Not current smoker,           Cardiovascular hypertension, (-) angina(-) Past MI, (-) Cardiac Stents, (-) CABG and (-) CHF + dysrhythmias (on Xarelto, last dose 5 days ago) Atrial Fibrillation      Neuro/Psych H/o spinal stenosis s/p lumbar surgery L3-4 decompression, L5-6 hardware Denies LE weakness/numbness/tingling negative psych ROS   GI/Hepatic Neg liver ROS, GERD  Controlled,  Endo/Other  diabetes, Insulin Dependent  Renal/GU CRFRenal disease     Musculoskeletal  (+) Arthritis , Osteoarthritis,    Abdominal   Peds negative pediatric ROS (+)  Hematology negative hematology ROS (+) anemia ,   Anesthesia Other Findings Obesity  Past Medical History: No date: A-fib (HCC) No date: Anemia No date: Arthritis No date: Cataracts, both eyes No date: Chickenpox No date: Chronic kidney disease No date: Complication of anesthesia No date: Diabetes mellitus without complication (HCC) No date: Dyspnea     Comment:  with exertion No date: Dysrhythmia No date: Hyperlipidemia No date: Hypertension No date: PONV (postoperative nausea and vomiting)     Comment:  with 1st pregnancy 50 years ago and no problem since               then No date: Right leg weakness No date: Spinal stenosis No date: UTI (urinary tract infection)  Past Surgical History: No date: BACK SURGERY     Comment:  x2 -cervical fusion and mid-lower back surgery No date: CARDIOVERSION No date: CATARACT  EXTRACTION, BILATERAL No date: CERVICAL LAMINECTOMY 01/12/2017: ELECTROPHYSIOLOGIC STUDY; N/A     Comment:  Procedure: Cardioversion;  Surgeon: Teodoro Spray, MD;               Location: [facility] ORS;  Service: Cardiovascular;                Laterality: N/A; No date: SHOULDER SURGERY  BMI    Body Mass Index:  37.49 kg/m      Reproductive/Obstetrics negative OB ROS                            Anesthesia Physical  Anesthesia Plan  ASA: III  Anesthesia Plan: General   Post-op Pain Management:    Induction:   PONV Risk Score and Plan:   Airway Management Planned: Oral ETT and Video Laryngoscope Planned  Additional Equipment:   Intra-op Plan:   Post-operative Plan: Extubation in OR  Informed Consent: I have reviewed the patients History and Physical, chart, labs and discussed the procedure including the risks, benefits and alternatives for the proposed anesthesia with the patient or authorized representative who has indicated his/her understanding and acceptance.     Dental Advisory Given  Plan Discussed with: Anesthesiologist and CRNA  Anesthesia Plan Comments:        Anesthesia Quick Evaluation

## 2020-12-09 DIAGNOSIS — R600 Localized edema: Secondary | ICD-10-CM | POA: Diagnosis not present

## 2020-12-09 DIAGNOSIS — I10 Essential (primary) hypertension: Secondary | ICD-10-CM | POA: Diagnosis not present

## 2020-12-09 DIAGNOSIS — N184 Chronic kidney disease, stage 4 (severe): Secondary | ICD-10-CM | POA: Diagnosis not present

## 2020-12-09 DIAGNOSIS — N179 Acute kidney failure, unspecified: Secondary | ICD-10-CM | POA: Diagnosis not present

## 2020-12-09 DIAGNOSIS — E790 Hyperuricemia without signs of inflammatory arthritis and tophaceous disease: Secondary | ICD-10-CM | POA: Diagnosis not present

## 2020-12-11 NOTE — Anesthesia Postprocedure Evaluation (Signed)
Anesthesia Post Note  Patient: Tiffany [name]  Procedure(s) Performed: CYSTOSCOPY WITH STENT PLACEMENT (Left ) DIAGNOSTIC URETEROSCOPY (N/A ) CYSTOSCOPY WITH RETROGRADE PYELOGRAM  Patient location during evaluation: PACU Anesthesia Type: General Level of consciousness: awake and alert and oriented Pain management: pain level controlled Vital Signs Assessment: post-procedure vital signs reviewed and stable Respiratory status: spontaneous breathing Cardiovascular status: blood pressure returned to baseline Anesthetic complications: no   No complications documented.   Last Vitals:  Vitals:   12/06/20 0950 12/06/20 1000  BP:  (!) 168/72  Pulse:  80  Resp:  18  Temp: (!) 36 C (!) 36.3 C  SpO2:  98%    Last Pain:  Vitals:   12/09/20 0832  TempSrc:   PainSc: 2                  Akshaj Besancon

## 2020-12-27 ENCOUNTER — Other Ambulatory Visit: Payer: Self-pay | Admitting: Urology

## 2020-12-27 DIAGNOSIS — R3989 Other symptoms and signs involving the genitourinary system: Secondary | ICD-10-CM

## 2020-12-30 NOTE — Telephone Encounter (Signed)
Would need to give a UA and culture before re-starting any antibiotics.   Also- did they request to change to [facility]? She was supposed to have follow up with me 2-3 weeks after her surgery in early December, but looks like her next appt is 1/6 with [facility]? Thanks  Nickolas Madrid, MD 12/30/2020

## 2020-12-30 NOTE — Telephone Encounter (Signed)
Husband is requesting additional antibiotics for wife, please advise.

## 2021-01-01 DIAGNOSIS — M5416 Radiculopathy, lumbar region: Secondary | ICD-10-CM | POA: Diagnosis not present

## 2021-01-01 DIAGNOSIS — M5136 Other intervertebral disc degeneration, lumbar region: Secondary | ICD-10-CM | POA: Diagnosis not present

## 2021-01-01 DIAGNOSIS — M48062 Spinal stenosis, lumbar region with neurogenic claudication: Secondary | ICD-10-CM | POA: Diagnosis not present

## 2021-01-01 DIAGNOSIS — M62830 Muscle spasm of back: Secondary | ICD-10-CM | POA: Diagnosis not present

## 2021-01-02 ENCOUNTER — Other Ambulatory Visit: Payer: Self-pay

## 2021-01-02 ENCOUNTER — Encounter: Payer: Self-pay | Admitting: Urology

## 2021-01-02 ENCOUNTER — Ambulatory Visit: Payer: PPO | Admitting: Urology

## 2021-01-02 VITALS — BP 120/80 | HR 70 | Ht 62.0 in

## 2021-01-02 DIAGNOSIS — N1330 Unspecified hydronephrosis: Secondary | ICD-10-CM

## 2021-01-02 DIAGNOSIS — N184 Chronic kidney disease, stage 4 (severe): Secondary | ICD-10-CM | POA: Diagnosis not present

## 2021-01-02 NOTE — Progress Notes (Signed)
01/02/2021 6:16 PM   Matika Ann Stanczyk 09/30/1933 [id number]  Reason for visit: Follow up CKD, left hydronephrosis  HPI: I saw Ms. Westby and her husband back in urology clinic for follow-up of CKD and left hydronephrosis today.  She is an 87-year-old female with CKD and baseline creatinine of 1.8(EGFR 27) who was found to have worsening renal function with a creatinine of 2.9(EGFR 15) and a renal ultrasound was performed that showed new moderate left hydronephrosis.  A CT confirmed moderate hydronephrosis with no stone or other etiology of obstruction.  She was asymptomatic from a urinary perspective, but urinalysis did grow out E. Coli.  She underwent cystoscopy and left diagnostic ureteroscopy on 12/06/2020 that showed no ureteral abnormalities or etiology of obstruction.  A left ureteral stent was placed.  She has been tolerating the stent well and denies any flank pain or other complaints.  She thinks she had a UTI at the end of December, and she started antibiotics with improvement in her symptoms.  No urinalysis or culture was sent at that time.  A repeat BMP since surgery has not yet been drawn.  I had a long conversation with them about her unclear etiology of the left hydronephrosis and worsening renal function, and need for BMP to evaluate renal function now that the stent has been placed.  We discussed possible options pending the outcome of her renal function including yearly stent exchanges, versus considering removing the stent and following up with a renal ultrasound to see if hydronephrosis has resolved.  She is leaning toward yearly stent exchanges.  We discussed the risk and benefits at length.  Follow-up BMP and call with results, anticipate follow-up in 6 months for repeat BMP and symptom check, and consider long-term stent exchanges  I spent 30 total minutes on the day of the encounter including pre-visit review of the medical record, face-to-face time with the patient, and  post visit ordering of labs/imaging/tests.    C , MD  [facility] [address], [address] [place], [place] [address] [phone]   

## 2021-01-03 LAB — CBC WITH DIFFERENTIAL/PLATELET
Basophils Absolute: 0.1 10*3/uL (ref 0.0–0.2)
Basos: 1 %
EOS (ABSOLUTE): 0.3 10*3/uL (ref 0.0–0.4)
Eos: 3 %
Hematocrit: 41.8 % (ref 34.0–46.6)
Hemoglobin: 13.8 g/dL (ref 11.1–15.9)
Immature Grans (Abs): 0.2 10*3/uL — ABNORMAL HIGH (ref 0.0–0.1)
Immature Granulocytes: 2 %
Lymphocytes Absolute: 2.8 10*3/uL (ref 0.7–3.1)
Lymphs: 29 %
MCH: 28 pg (ref 26.6–33.0)
MCHC: 33 g/dL (ref 31.5–35.7)
MCV: 85 fL (ref 79–97)
Monocytes Absolute: 0.5 10*3/uL (ref 0.1–0.9)
Monocytes: 5 %
Neutrophils Absolute: 6.1 10*3/uL (ref 1.4–7.0)
Neutrophils: 60 %
Platelets: 313 10*3/uL (ref 150–450)
RBC: 4.93 x10E6/uL (ref 3.77–5.28)
RDW: 13.3 % (ref 11.7–15.4)
WBC: 10 10*3/uL (ref 3.4–10.8)

## 2021-01-03 LAB — BASIC METABOLIC PANEL
BUN/Creatinine Ratio: 16 (ref 12–28)
BUN: 43 mg/dL — ABNORMAL HIGH (ref 8–27)
CO2: 23 mmol/L (ref 20–29)
Calcium: 9.3 mg/dL (ref 8.7–10.3)
Chloride: 100 mmol/L (ref 96–106)
Creatinine, Ser: 2.65 mg/dL — ABNORMAL HIGH (ref 0.57–1.00)
GFR calc Af Amer: 18 mL/min/{1.73_m2} — ABNORMAL LOW (ref >59)
GFR calc non Af Amer: 16 mL/min/{1.73_m2} — ABNORMAL LOW (ref >59)
Glucose: 268 mg/dL — ABNORMAL HIGH (ref 65–99)
Potassium: 5 mmol/L (ref 3.5–5.2)
Sodium: 139 mmol/L (ref 134–144)

## 2021-01-07 ENCOUNTER — Telehealth: Payer: Self-pay

## 2021-01-07 DIAGNOSIS — N184 Chronic kidney disease, stage 4 (severe): Secondary | ICD-10-CM

## 2021-01-07 DIAGNOSIS — N1330 Unspecified hydronephrosis: Secondary | ICD-10-CM

## 2021-01-07 NOTE — Telephone Encounter (Signed)
Called pt informed her of the information below. Pt gave verbal understanding. Lab ordered, appt scheduled. U/S ordered.

## 2021-01-07 NOTE — Telephone Encounter (Signed)
-----   Message from Billey Co, MD sent at 01/07/2021  8:13 AM EST ----- Renal function has continued to decline despite stent placement, please order a repeat BMP and renal US for this week and follow up with me within 10 days to discuss, thanks  Nickolas Madrid, MD 01/07/2021

## 2021-01-17 ENCOUNTER — Other Ambulatory Visit: Payer: Self-pay

## 2021-01-20 ENCOUNTER — Telehealth: Payer: Self-pay | Admitting: *Deleted

## 2021-01-20 ENCOUNTER — Ambulatory Visit
Admission: RE | Admit: 2021-01-20 | Discharge: 2021-01-20 | Disposition: A | Discharge status: Home / Self Care | Payer: PPO | Source: Ambulatory Visit | Attending: Urology | Admitting: Urology

## 2021-01-20 ENCOUNTER — Other Ambulatory Visit: Payer: Self-pay

## 2021-01-20 DIAGNOSIS — I480 Paroxysmal atrial fibrillation: Secondary | ICD-10-CM | POA: Diagnosis not present

## 2021-01-20 DIAGNOSIS — N1330 Unspecified hydronephrosis: Secondary | ICD-10-CM | POA: Insufficient documentation

## 2021-01-20 DIAGNOSIS — Z794 Long term (current) use of insulin: Secondary | ICD-10-CM | POA: Diagnosis not present

## 2021-01-20 DIAGNOSIS — N184 Chronic kidney disease, stage 4 (severe): Secondary | ICD-10-CM | POA: Diagnosis not present

## 2021-01-20 DIAGNOSIS — E1122 Type 2 diabetes mellitus with diabetic chronic kidney disease: Secondary | ICD-10-CM | POA: Diagnosis not present

## 2021-01-20 DIAGNOSIS — N281 Cyst of kidney, acquired: Secondary | ICD-10-CM | POA: Diagnosis not present

## 2021-01-20 DIAGNOSIS — Z79891 Long term (current) use of opiate analgesic: Secondary | ICD-10-CM | POA: Diagnosis not present

## 2021-01-20 DIAGNOSIS — E7849 Other hyperlipidemia: Secondary | ICD-10-CM | POA: Diagnosis not present

## 2021-01-20 NOTE — Telephone Encounter (Signed)
Results are in for ultrasound.

## 2021-01-22 ENCOUNTER — Other Ambulatory Visit: Payer: Self-pay

## 2021-01-22 ENCOUNTER — Encounter: Payer: Self-pay | Admitting: Urology

## 2021-01-22 ENCOUNTER — Ambulatory Visit: Payer: PPO | Admitting: Urology

## 2021-01-22 VITALS — BP 172/94 | HR 73 | Ht 62.0 in | Wt 195.0 lb

## 2021-01-22 DIAGNOSIS — N1330 Unspecified hydronephrosis: Secondary | ICD-10-CM

## 2021-01-22 DIAGNOSIS — N184 Chronic kidney disease, stage 4 (severe): Secondary | ICD-10-CM

## 2021-01-22 MED ORDER — CEPHALEXIN 500 MG PO CAPS: 500.0000 mg | ORAL_CAPSULE | Freq: Every day | ORAL | 1 refills | Status: DC

## 2021-01-22 NOTE — Progress Notes (Signed)
   01/22/2021 1:01 PM   Tiffany [name] 11-May-1933 [id number]  Reason for visit: Follow up left hydronephrosis, CKD  HPI: I saw Tiffany [name] and her husband back in clinic today for the above issues.  She had a left ureteral stent placed on 12/06/2020 for left hydronephrosis down to the bladder of unclear etiology with worsening renal function and a creatinine of 2.9 from a baseline of ~1.7-1.9.  Cystoscopy showed no obvious lesions aside from a thickened bladder, and there were no obvious obstructions, and a ureteral stent was placed on the left side.  She had a follow-up BMP 1 month later that showed persistent creatinine elevation of 2.6, and we repeated this a week later and it had normalized to 2.0.  I personally viewed and interpreted the renal ultrasound that shows moderate left hydronephrosis with a stent in place.  We reviewed that the hydronephrosis is not uncommon as the stent can cause reflux.  Reassurance was provided regarding the improvement in renal function back to near her baseline.  She has been on multiple courses of antibiotics for bladder spasms and suspicious urinalysis via her PCP.  We had a long conversation today about the difficulty of interpreting a urinalysis with her ureteral stent in place, and the importance of a culture.  We also discussed that stent symptoms can be similar to UTI symptoms.  We also reviewed the concept of asymptomatic bacteriuria.  I will call you it sounds like she has had some spasms, and they recently started Bactrim a few days ago which have improved her bladder spasms.  She denies any flank pain or fevers.  She feels the antibiotics help with the bladder spasms.  We reviewed that Bactrim can cause a false elevation in the creatinine, which may explain why her creatinine was still elevated at 2.6 after stent placement when she was on Bactrim, then improved to 2.0 after stopping the Bactrim.  We also discussed possible AIN with Bactrim.  They strongly  would like to pursue a prolonged course of antibiotics to see if this helps with her urinary symptoms and spasms.  We discussed the risks and benefits at length.  Trial of Keflex 500 mg daily x1 month RTC 6 weeks for symptom check, if persistent symptoms will check UA and culture at that time Consider Myrbetriq if bladder spasms despite negative urine cultures Tentatively plan for yearly stent exchanges   Billey Co, [place] [address], [place], [place] [address] [phone]

## 2021-01-22 NOTE — Patient Instructions (Signed)
You have a ureteral stent in place.  This will always cause the urine to look infected on urinalysis, and it is important to send a culture if you are having burning with urination or any fevers.  We will try 1 month of Keflex daily to see if this improves some of your bladder spasms, then discontinue this medication.  We will have follow-up in 6 weeks to see how the urinary symptoms and spasms are going well with you off of antibiotics.  We can consider Myrbetriq in the future which is an antibladder spasm medication(not an antibiotic) if you have persistent symptoms despite negative urine cultures in the future   The kidney function is back to your baseline with a creatinine of 2, from 2.9 before we put the stent in.   Ureteral Stent Implantation, Care After This sheet gives you information about how to care for yourself after your procedure. Your health care provider may also give you more specific instructions. If you have problems or questions, contact your health care provider. What can I expect after the procedure? After the procedure, it is common to have:  Nausea.  Mild pain when you urinate. You may feel this pain in your lower back or lower abdomen. The pain should stop within a few minutes after you urinate. This may last for up to 1 week.  A small amount of blood in your urine for several days. Follow these instructions at home: Medicines  Take over-the-counter and prescription medicines only as told by your health care provider.  If you were prescribed an antibiotic medicine, take it as told by your health care provider. Do not stop taking the antibiotic even if you start to feel better.  Do not drive for 24 hours if you were given a sedative during your procedure.  Ask your health care provider if the medicine prescribed to you requires you to avoid driving or using heavy machinery. Activity  Rest as told by your health care provider.  Avoid sitting for a long time  without moving. Get up to take short walks every 1-2 hours. This is important to improve blood flow and breathing. Ask for help if you feel weak or unsteady.  Return to your normal activities as told by your health care provider. Ask your health care provider what activities are safe for you. General instructions  Watch for any blood in your urine. Call your health care provider if the amount of blood in your urine increases.  If you have a catheter: ? Follow instructions from your health care provider about taking care of your catheter and collection bag. ? Do not take baths, swim, or use a hot tub until your health care provider approves. Ask your health care provider if you may take showers. You may only be allowed to take sponge baths.  Drink enough fluid to keep your urine pale yellow.  Do not use any products that contain nicotine or tobacco, such as cigarettes, e-cigarettes, and chewing tobacco. These can delay healing after surgery. If you need help quitting, ask your health care provider.  Keep all follow-up visits as told by your health care provider. This is important.   Contact a health care provider if:  You have pain that gets worse or does not get better with medicine, especially pain when you urinate.  You have difficulty urinating.  You feel nauseous or you vomit repeatedly during a period of more than 2 days after the procedure. Get help right away if:  Your urine is dark red or has blood clots in it.  You are leaking urine (have incontinence).  The end of the stent comes out of your urethra.  You cannot urinate.  You have sudden, sharp, or severe pain in your abdomen or lower back.  You have a fever.  You have swelling or pain in your legs.  You have difficulty breathing. Summary  After the procedure, it is common to have mild pain when you urinate that goes away within a few minutes after you urinate. This may last for up to 1 week.  Watch for any blood  in your urine. Call your health care provider if the amount of blood in your urine increases.  Take over-the-counter and prescription medicines only as told by your health care provider.  Drink enough fluid to keep your urine pale yellow. This information is not intended to replace advice given to you by your health care provider. Make sure you discuss any questions you have with your health care provider. Document Revised: 09/20/2018 Document Reviewed: 09/21/2018 Elsevier Patient Education  2021 [employer].

## 2021-02-18 ENCOUNTER — Other Ambulatory Visit: Payer: Self-pay | Admitting: Physical Medicine and Rehabilitation

## 2021-02-18 DIAGNOSIS — M5416 Radiculopathy, lumbar region: Secondary | ICD-10-CM

## 2021-02-25 IMAGING — US US RENAL
1 series · 14 of 25 positions shown · non-contrast
Comparison: 12/02/2020 and prior.

CLINICAL DATA: hydronephrosis

EXAM:
RENAL / URINARY TRACT ULTRASOUND COMPLETE

[Series 1: us renal · 14 of 56 slices shown]
[im 1/56]
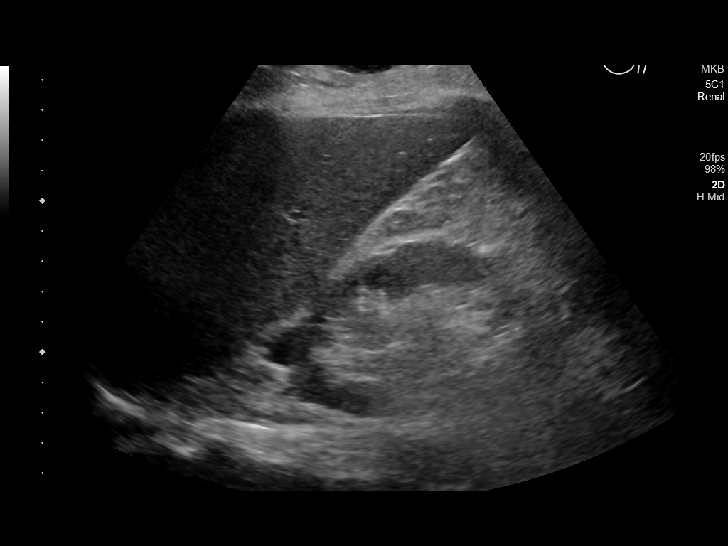
[im 5/56]
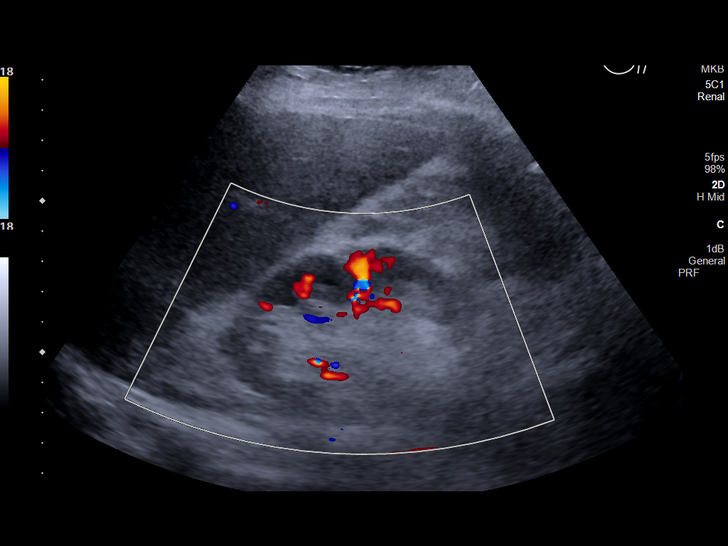
[im 10/56]
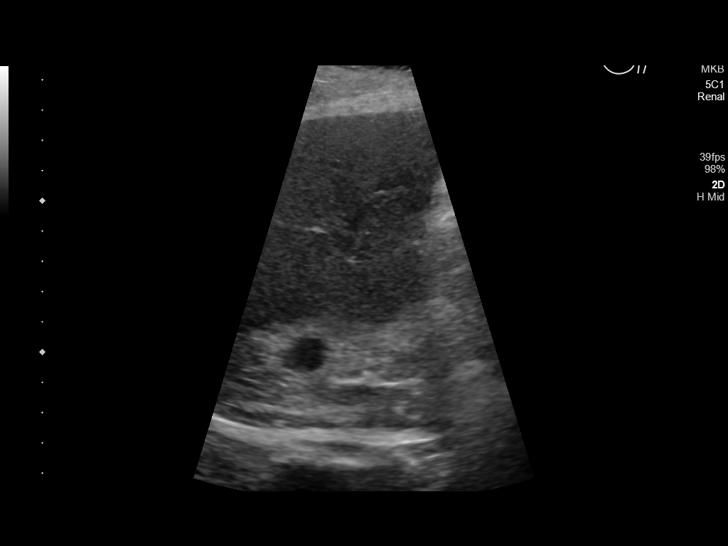
[im 14/56]
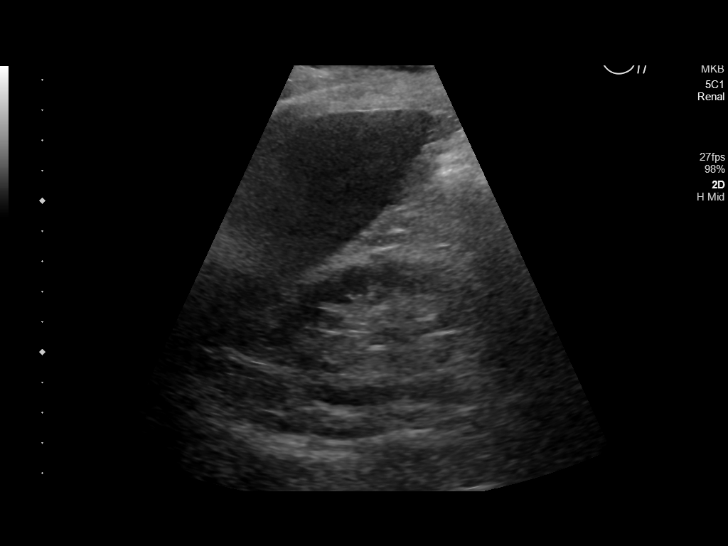
[im 19/56]
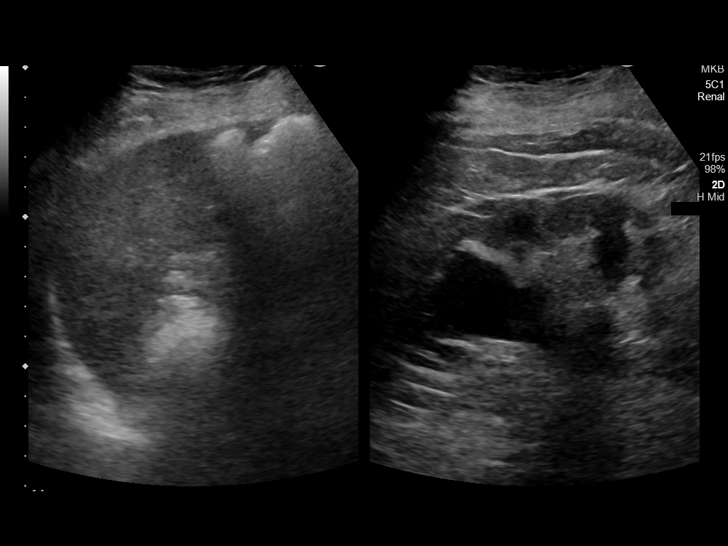
[im 21/56]
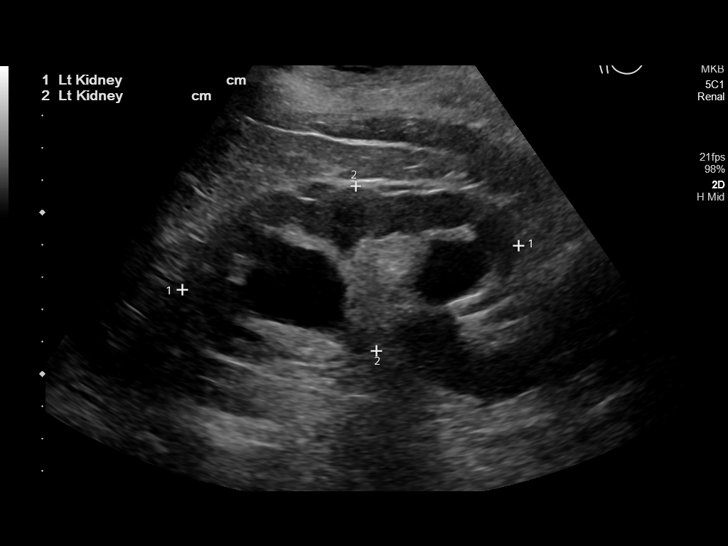
[im 26/56]
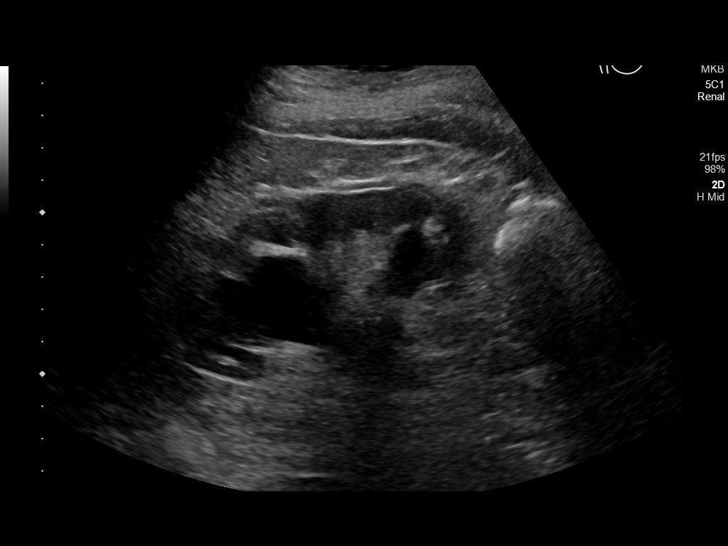
[im 30/56]
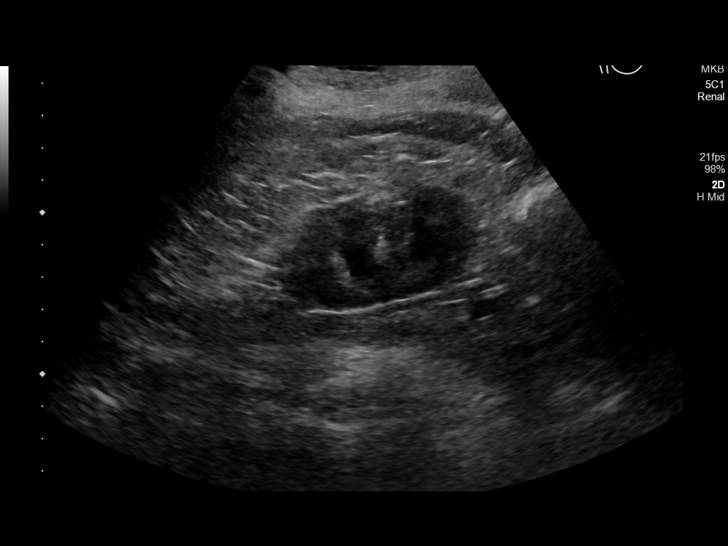
[im 35/56]
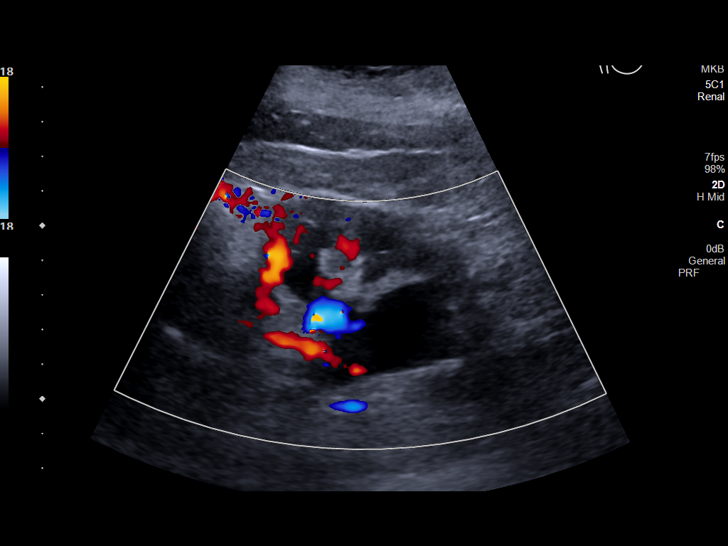
[im 37/56]
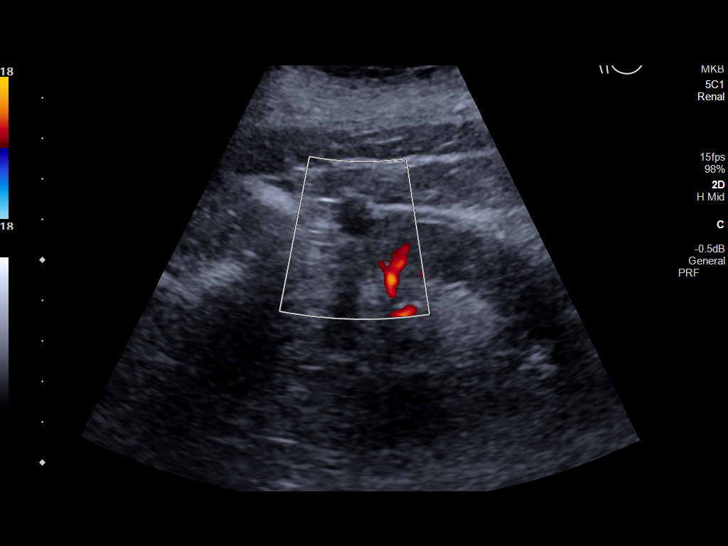
[im 42/56]
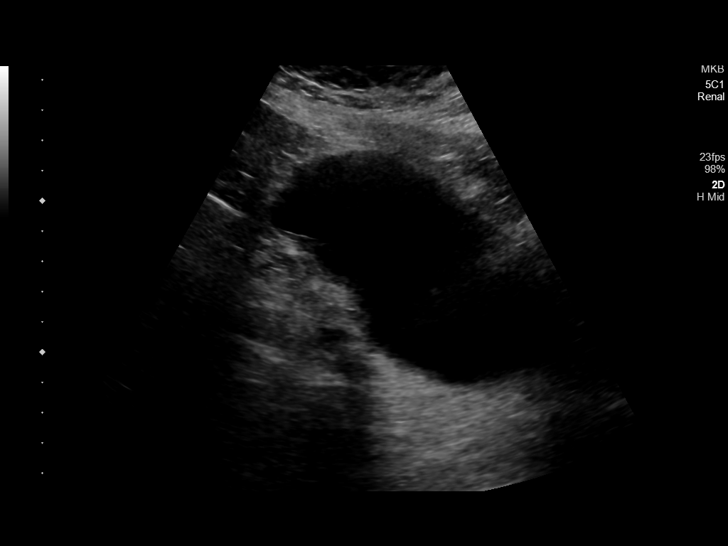
[im 46/56]
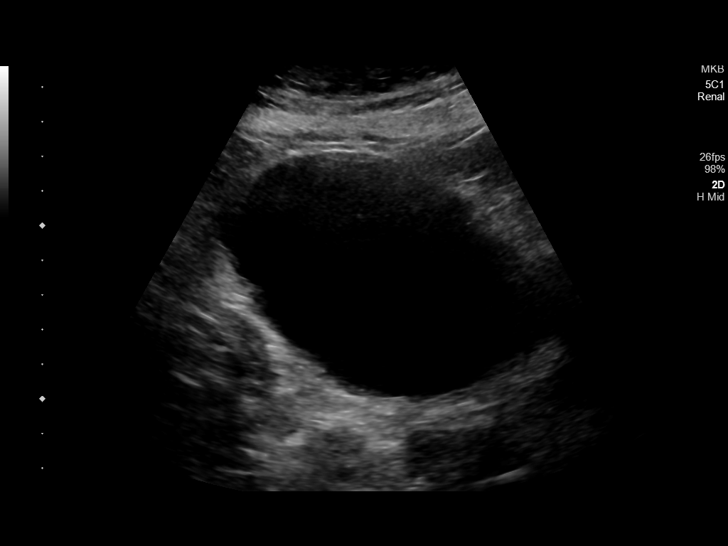
[im 51/56]
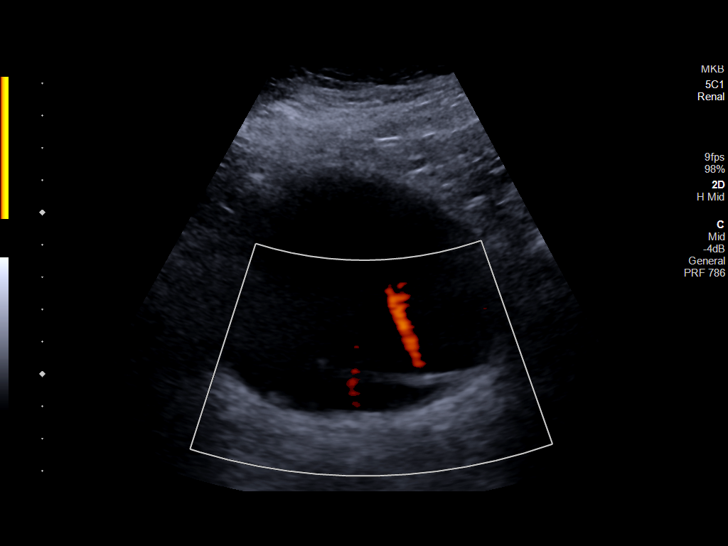
[im 56/56]
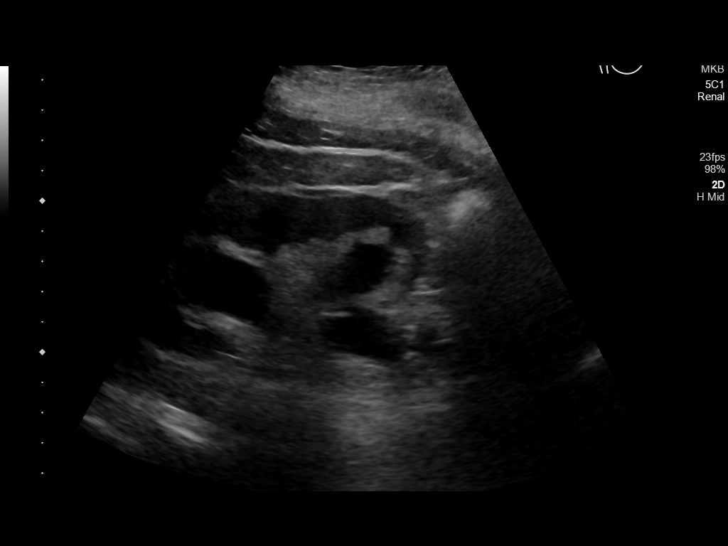

[14 of 25 positions shown; findings below may reference images not displayed]

FINDINGS: Right Kidney:

Renal measurements: 8.7 x 4.8 x 5.1 cm = volume: 111.9 mL. Mild
cortical atrophy. Echogenicity within normal limits. No
hydronephrosis visualized. Multiple simple cysts measuring up to
cm.

Left Kidney:

Renal measurements: 10.5 x 5.2 x 4.8 cm = volume: 136.5 mL. Mild
cortical atrophy. Echogenicity within normal limits. Moderate
hydronephrosis. Multiple simple cyst measuring up to 1.0 cm.

Bladder:

Appears normal for degree of bladder distention. Partially imaged
indwelling stent.

Other:

None.
IMPRESSION: Moderate left hydronephrosis. Consider correlative radiographs to
confirm appropriate positioning of left ureteral stent.

Bilateral cortical atrophy and simple renal cysts.

These results will be called to the ordering clinician or
representative by the Radiologist Assistant, and communication
documented in the PACS or [REDACTED].

## 2021-03-05 ENCOUNTER — Ambulatory Visit: Payer: PPO | Admitting: Urology

## 2021-03-05 ENCOUNTER — Encounter: Payer: Self-pay | Admitting: Urology

## 2021-03-05 ENCOUNTER — Other Ambulatory Visit: Payer: Self-pay

## 2021-03-05 VITALS — BP 162/88 | HR 74 | Ht 64.0 in | Wt 195.0 lb

## 2021-03-05 DIAGNOSIS — R3989 Other symptoms and signs involving the genitourinary system: Secondary | ICD-10-CM

## 2021-03-05 DIAGNOSIS — N1330 Unspecified hydronephrosis: Secondary | ICD-10-CM | POA: Diagnosis not present

## 2021-03-05 NOTE — Progress Notes (Signed)
   03/05/2021 3:08 PM   [name] 08/13/1933 [id number]  Reason for visit: Follow up left hydronephrosis, CKD  HPI: I saw Tiffany [name] and her husband back in clinic today for the above issues.  She had a left ureteral stent placed on 12/06/2020 for left hydronephrosis down to the bladder of unclear etiology with worsening renal function and a creatinine of 2.9 from a baseline of ~1.7-1.9.  Cystoscopy showed no obvious lesions aside from a thickened bladder, and there were no obvious obstructions, and a ureteral stent was placed on the left side.  She had a follow-up BMP 1 month later that showed persistent creatinine elevation of 2.6(suspect false elevation secondary to Bactrim vs AIN), and we repeated this a week later and it had normalized to 2.0.    She had been on multiple courses of antibiotics for bladder spasms and suspicious urinalyses with her PCP.  We had reviewed at length that the urinalysis will always look infected with a ureteral stent in place, and a urine culture is important prior to starting any antibiotics.  We also discussed that asymptomatic bacteriuria is normal in women, and especially with a ureteral stent in place.  At our last visit they had requested a 1 month course of Keflex daily to see if this improved some of her urinary symptoms and would prevent infections.  Her husband is a retired PCP and has managed much of her care.  She has been off antibiotics for 2 weeks and is doing very well.  She denies any urinary symptoms of dysuria, significant urgency, frequency, flank pain, or gross hematuria.  She is doing well.  No recent BMP value to review.  We discussed symptoms like dysuria, confusion, or fevers that would warrant urinalysis and culture, and consideration of antibiotics.  Would only prescribe antibiotics with positive urine culture and symptoms RTC 6 months with BMP prior Tentatively plan for stent changes every 9 to 12 months  Billey Co,  MD  [facility] [address] [place], [place] [address] [phone]

## 2021-03-06 ENCOUNTER — Ambulatory Visit
Admission: RE | Admit: 2021-03-06 | Discharge: 2021-03-06 | Disposition: A | Discharge status: Home / Self Care | Payer: PPO | Source: Ambulatory Visit | Attending: Physical Medicine and Rehabilitation | Admitting: Physical Medicine and Rehabilitation

## 2021-03-06 ENCOUNTER — Other Ambulatory Visit: Payer: Self-pay

## 2021-03-06 DIAGNOSIS — M545 Low back pain, unspecified: Secondary | ICD-10-CM | POA: Diagnosis not present

## 2021-03-06 DIAGNOSIS — M5416 Radiculopathy, lumbar region: Secondary | ICD-10-CM | POA: Diagnosis not present

## 2021-03-19 DIAGNOSIS — M48062 Spinal stenosis, lumbar region with neurogenic claudication: Secondary | ICD-10-CM | POA: Diagnosis not present

## 2021-03-19 DIAGNOSIS — M5416 Radiculopathy, lumbar region: Secondary | ICD-10-CM | POA: Diagnosis not present

## 2021-03-19 DIAGNOSIS — M5126 Other intervertebral disc displacement, lumbar region: Secondary | ICD-10-CM | POA: Diagnosis not present

## 2021-04-15 DIAGNOSIS — M5136 Other intervertebral disc degeneration, lumbar region: Secondary | ICD-10-CM | POA: Diagnosis not present

## 2021-04-15 DIAGNOSIS — Z794 Long term (current) use of insulin: Secondary | ICD-10-CM | POA: Diagnosis not present

## 2021-04-15 DIAGNOSIS — M5441 Lumbago with sciatica, right side: Secondary | ICD-10-CM | POA: Diagnosis not present

## 2021-04-15 DIAGNOSIS — G8929 Other chronic pain: Secondary | ICD-10-CM | POA: Diagnosis not present

## 2021-04-15 DIAGNOSIS — N184 Chronic kidney disease, stage 4 (severe): Secondary | ICD-10-CM | POA: Diagnosis not present

## 2021-04-15 DIAGNOSIS — E7849 Other hyperlipidemia: Secondary | ICD-10-CM | POA: Diagnosis not present

## 2021-04-15 DIAGNOSIS — E1122 Type 2 diabetes mellitus with diabetic chronic kidney disease: Secondary | ICD-10-CM | POA: Diagnosis not present

## 2021-04-15 DIAGNOSIS — M5126 Other intervertebral disc displacement, lumbar region: Secondary | ICD-10-CM | POA: Diagnosis not present

## 2021-04-15 DIAGNOSIS — M5416 Radiculopathy, lumbar region: Secondary | ICD-10-CM | POA: Diagnosis not present

## 2021-04-23 DIAGNOSIS — Z79891 Long term (current) use of opiate analgesic: Secondary | ICD-10-CM | POA: Diagnosis not present

## 2021-04-23 DIAGNOSIS — E1122 Type 2 diabetes mellitus with diabetic chronic kidney disease: Secondary | ICD-10-CM | POA: Diagnosis not present

## 2021-04-23 DIAGNOSIS — M25562 Pain in left knee: Secondary | ICD-10-CM | POA: Diagnosis not present

## 2021-04-23 DIAGNOSIS — I5022 Chronic systolic (congestive) heart failure: Secondary | ICD-10-CM | POA: Diagnosis not present

## 2021-04-23 DIAGNOSIS — I480 Paroxysmal atrial fibrillation: Secondary | ICD-10-CM | POA: Diagnosis not present

## 2021-04-23 DIAGNOSIS — I10 Essential (primary) hypertension: Secondary | ICD-10-CM | POA: Diagnosis not present

## 2021-04-23 DIAGNOSIS — N184 Chronic kidney disease, stage 4 (severe): Secondary | ICD-10-CM | POA: Diagnosis not present

## 2021-04-23 DIAGNOSIS — M48061 Spinal stenosis, lumbar region without neurogenic claudication: Secondary | ICD-10-CM | POA: Diagnosis not present

## 2021-04-23 DIAGNOSIS — E7849 Other hyperlipidemia: Secondary | ICD-10-CM | POA: Diagnosis not present

## 2021-04-23 DIAGNOSIS — D649 Anemia, unspecified: Secondary | ICD-10-CM | POA: Diagnosis not present

## 2021-04-23 DIAGNOSIS — Z794 Long term (current) use of insulin: Secondary | ICD-10-CM | POA: Diagnosis not present

## 2021-08-20 DIAGNOSIS — Z79891 Long term (current) use of opiate analgesic: Secondary | ICD-10-CM | POA: Diagnosis not present

## 2021-08-20 DIAGNOSIS — E1122 Type 2 diabetes mellitus with diabetic chronic kidney disease: Secondary | ICD-10-CM | POA: Diagnosis not present

## 2021-08-20 DIAGNOSIS — N184 Chronic kidney disease, stage 4 (severe): Secondary | ICD-10-CM | POA: Diagnosis not present

## 2021-08-20 DIAGNOSIS — D649 Anemia, unspecified: Secondary | ICD-10-CM | POA: Diagnosis not present

## 2021-08-20 DIAGNOSIS — Z794 Long term (current) use of insulin: Secondary | ICD-10-CM | POA: Diagnosis not present

## 2021-08-20 DIAGNOSIS — E7849 Other hyperlipidemia: Secondary | ICD-10-CM | POA: Diagnosis not present

## 2021-08-27 DIAGNOSIS — E1122 Type 2 diabetes mellitus with diabetic chronic kidney disease: Secondary | ICD-10-CM | POA: Diagnosis not present

## 2021-08-27 DIAGNOSIS — I10 Essential (primary) hypertension: Secondary | ICD-10-CM | POA: Diagnosis not present

## 2021-08-27 DIAGNOSIS — M48061 Spinal stenosis, lumbar region without neurogenic claudication: Secondary | ICD-10-CM | POA: Diagnosis not present

## 2021-08-27 DIAGNOSIS — E7849 Other hyperlipidemia: Secondary | ICD-10-CM | POA: Diagnosis not present

## 2021-08-27 DIAGNOSIS — D649 Anemia, unspecified: Secondary | ICD-10-CM | POA: Diagnosis not present

## 2021-08-27 DIAGNOSIS — I5022 Chronic systolic (congestive) heart failure: Secondary | ICD-10-CM | POA: Diagnosis not present

## 2021-08-27 DIAGNOSIS — E11621 Type 2 diabetes mellitus with foot ulcer: Secondary | ICD-10-CM | POA: Diagnosis not present

## 2021-08-27 DIAGNOSIS — L97522 Non-pressure chronic ulcer of other part of left foot with fat layer exposed: Secondary | ICD-10-CM | POA: Diagnosis not present

## 2021-08-27 DIAGNOSIS — N184 Chronic kidney disease, stage 4 (severe): Secondary | ICD-10-CM | POA: Diagnosis not present

## 2021-08-27 DIAGNOSIS — Z0000 Encounter for general adult medical examination without abnormal findings: Secondary | ICD-10-CM | POA: Diagnosis not present

## 2021-08-27 DIAGNOSIS — I480 Paroxysmal atrial fibrillation: Secondary | ICD-10-CM | POA: Diagnosis not present

## 2021-08-29 DIAGNOSIS — E1142 Type 2 diabetes mellitus with diabetic polyneuropathy: Secondary | ICD-10-CM | POA: Diagnosis not present

## 2021-08-29 DIAGNOSIS — L97522 Non-pressure chronic ulcer of other part of left foot with fat layer exposed: Secondary | ICD-10-CM | POA: Diagnosis not present

## 2021-08-29 DIAGNOSIS — M1A00X1 Idiopathic chronic gout, unspecified site, with tophus (tophi): Secondary | ICD-10-CM | POA: Diagnosis not present

## 2021-08-29 DIAGNOSIS — M79672 Pain in left foot: Secondary | ICD-10-CM | POA: Diagnosis not present

## 2021-09-03 ENCOUNTER — Other Ambulatory Visit: Payer: Self-pay

## 2021-09-08 ENCOUNTER — Other Ambulatory Visit: Payer: Self-pay | Admitting: Urology

## 2021-09-10 ENCOUNTER — Ambulatory Visit: Payer: Self-pay | Admitting: Urology

## 2021-09-10 DIAGNOSIS — L97522 Non-pressure chronic ulcer of other part of left foot with fat layer exposed: Secondary | ICD-10-CM | POA: Diagnosis not present

## 2021-09-10 DIAGNOSIS — E1142 Type 2 diabetes mellitus with diabetic polyneuropathy: Secondary | ICD-10-CM | POA: Diagnosis not present

## 2021-10-01 DIAGNOSIS — L97522 Non-pressure chronic ulcer of other part of left foot with fat layer exposed: Secondary | ICD-10-CM | POA: Diagnosis not present

## 2021-10-01 DIAGNOSIS — E1142 Type 2 diabetes mellitus with diabetic polyneuropathy: Secondary | ICD-10-CM | POA: Diagnosis not present

## 2021-10-27 ENCOUNTER — Other Ambulatory Visit
Admission: RE | Admit: 2021-10-27 | Discharge: 2021-10-27 | Disposition: A | Discharge status: Home / Self Care | Payer: PPO | Source: Ambulatory Visit | Attending: Cardiology | Admitting: Cardiology

## 2021-10-27 DIAGNOSIS — R0602 Shortness of breath: Secondary | ICD-10-CM | POA: Insufficient documentation

## 2021-10-27 DIAGNOSIS — Z794 Long term (current) use of insulin: Secondary | ICD-10-CM | POA: Diagnosis not present

## 2021-10-27 DIAGNOSIS — E1122 Type 2 diabetes mellitus with diabetic chronic kidney disease: Secondary | ICD-10-CM | POA: Diagnosis not present

## 2021-10-27 DIAGNOSIS — D649 Anemia, unspecified: Secondary | ICD-10-CM | POA: Diagnosis not present

## 2021-10-27 DIAGNOSIS — I5022 Chronic systolic (congestive) heart failure: Secondary | ICD-10-CM | POA: Diagnosis not present

## 2021-10-27 DIAGNOSIS — I480 Paroxysmal atrial fibrillation: Secondary | ICD-10-CM | POA: Insufficient documentation

## 2021-10-27 DIAGNOSIS — I10 Essential (primary) hypertension: Secondary | ICD-10-CM | POA: Diagnosis not present

## 2021-10-27 DIAGNOSIS — E7849 Other hyperlipidemia: Secondary | ICD-10-CM | POA: Diagnosis not present

## 2021-10-27 DIAGNOSIS — N184 Chronic kidney disease, stage 4 (severe): Secondary | ICD-10-CM | POA: Diagnosis not present

## 2021-10-27 DIAGNOSIS — J90 Pleural effusion, not elsewhere classified: Secondary | ICD-10-CM | POA: Diagnosis not present

## 2021-10-27 LAB — BRAIN NATRIURETIC PEPTIDE: B Natriuretic Peptide: 199.8 pg/mL — ABNORMAL HIGH (ref 0.0–100.0)

## 2021-10-28 ENCOUNTER — Encounter: Admission: RE | Payer: Self-pay | Source: Home / Self Care

## 2021-10-28 ENCOUNTER — Ambulatory Visit: Admission: RE | Admit: 2021-10-28 | Payer: PPO | Source: Home / Self Care | Admitting: Cardiology

## 2021-10-28 DIAGNOSIS — I480 Paroxysmal atrial fibrillation: Secondary | ICD-10-CM | POA: Diagnosis not present

## 2021-10-28 DIAGNOSIS — R0602 Shortness of breath: Secondary | ICD-10-CM | POA: Diagnosis not present

## 2021-10-28 DIAGNOSIS — I5022 Chronic systolic (congestive) heart failure: Secondary | ICD-10-CM | POA: Diagnosis not present

## 2021-10-28 SURGERY — CARDIOVERSION
Anesthesia: General

## 2021-11-03 ENCOUNTER — Other Ambulatory Visit: Payer: Self-pay | Admitting: Cardiology

## 2021-11-04 ENCOUNTER — Other Ambulatory Visit: Payer: Self-pay

## 2021-11-04 ENCOUNTER — Encounter: Payer: Self-pay | Admitting: Cardiology

## 2021-11-04 ENCOUNTER — Ambulatory Visit
Admission: RE | Admit: 2021-11-04 | Discharge: 2021-11-04 | Disposition: A | Discharge status: Home / Self Care | Payer: PPO | Source: Home / Self Care | Attending: Cardiology | Admitting: Cardiology

## 2021-11-04 ENCOUNTER — Ambulatory Visit: Payer: PPO | Admitting: Certified Registered Nurse Anesthetist

## 2021-11-04 ENCOUNTER — Encounter
Admission: RE | Disposition: A | Discharge status: Home / Self Care | Payer: Self-pay | Source: Home / Self Care | Attending: Cardiology

## 2021-11-04 DIAGNOSIS — Z885 Allergy status to narcotic agent status: Secondary | ICD-10-CM | POA: Diagnosis not present

## 2021-11-04 DIAGNOSIS — N3289 Other specified disorders of bladder: Secondary | ICD-10-CM | POA: Diagnosis not present

## 2021-11-04 DIAGNOSIS — N281 Cyst of kidney, acquired: Secondary | ICD-10-CM | POA: Diagnosis not present

## 2021-11-04 DIAGNOSIS — E1122 Type 2 diabetes mellitus with diabetic chronic kidney disease: Secondary | ICD-10-CM | POA: Diagnosis present

## 2021-11-04 DIAGNOSIS — K439 Ventral hernia without obstruction or gangrene: Secondary | ICD-10-CM | POA: Diagnosis not present

## 2021-11-04 DIAGNOSIS — I130 Hypertensive heart and chronic kidney disease with heart failure and stage 1 through stage 4 chronic kidney disease, or unspecified chronic kidney disease: Secondary | ICD-10-CM | POA: Diagnosis present

## 2021-11-04 DIAGNOSIS — N131 Hydronephrosis with ureteral stricture, not elsewhere classified: Secondary | ICD-10-CM | POA: Diagnosis present

## 2021-11-04 DIAGNOSIS — D72829 Elevated white blood cell count, unspecified: Secondary | ICD-10-CM | POA: Diagnosis present

## 2021-11-04 DIAGNOSIS — I248 Other forms of acute ischemic heart disease: Secondary | ICD-10-CM | POA: Diagnosis present

## 2021-11-04 DIAGNOSIS — R310 Gross hematuria: Secondary | ICD-10-CM | POA: Diagnosis not present

## 2021-11-04 DIAGNOSIS — Z7984 Long term (current) use of oral hypoglycemic drugs: Secondary | ICD-10-CM | POA: Insufficient documentation

## 2021-11-04 DIAGNOSIS — E782 Mixed hyperlipidemia: Secondary | ICD-10-CM | POA: Insufficient documentation

## 2021-11-04 DIAGNOSIS — N184 Chronic kidney disease, stage 4 (severe): Secondary | ICD-10-CM | POA: Insufficient documentation

## 2021-11-04 DIAGNOSIS — I129 Hypertensive chronic kidney disease with stage 1 through stage 4 chronic kidney disease, or unspecified chronic kidney disease: Secondary | ICD-10-CM | POA: Insufficient documentation

## 2021-11-04 DIAGNOSIS — Z7951 Long term (current) use of inhaled steroids: Secondary | ICD-10-CM | POA: Diagnosis not present

## 2021-11-04 DIAGNOSIS — I480 Paroxysmal atrial fibrillation: Secondary | ICD-10-CM | POA: Insufficient documentation

## 2021-11-04 DIAGNOSIS — R1030 Lower abdominal pain, unspecified: Secondary | ICD-10-CM | POA: Diagnosis not present

## 2021-11-04 DIAGNOSIS — Z20822 Contact with and (suspected) exposure to covid-19: Secondary | ICD-10-CM | POA: Diagnosis present

## 2021-11-04 DIAGNOSIS — Z888 Allergy status to other drugs, medicaments and biological substances status: Secondary | ICD-10-CM | POA: Diagnosis not present

## 2021-11-04 DIAGNOSIS — Z881 Allergy status to other antibiotic agents status: Secondary | ICD-10-CM | POA: Diagnosis not present

## 2021-11-04 DIAGNOSIS — Z96652 Presence of left artificial knee joint: Secondary | ICD-10-CM | POA: Diagnosis present

## 2021-11-04 DIAGNOSIS — D689 Coagulation defect, unspecified: Secondary | ICD-10-CM | POA: Diagnosis present

## 2021-11-04 DIAGNOSIS — I5023 Acute on chronic systolic (congestive) heart failure: Secondary | ICD-10-CM | POA: Diagnosis present

## 2021-11-04 DIAGNOSIS — I4891 Unspecified atrial fibrillation: Secondary | ICD-10-CM | POA: Diagnosis not present

## 2021-11-04 DIAGNOSIS — R339 Retention of urine, unspecified: Secondary | ICD-10-CM | POA: Diagnosis not present

## 2021-11-04 DIAGNOSIS — Z79899 Other long term (current) drug therapy: Secondary | ICD-10-CM | POA: Diagnosis not present

## 2021-11-04 DIAGNOSIS — Z794 Long term (current) use of insulin: Secondary | ICD-10-CM | POA: Diagnosis not present

## 2021-11-04 DIAGNOSIS — D649 Anemia, unspecified: Secondary | ICD-10-CM | POA: Diagnosis not present

## 2021-11-04 DIAGNOSIS — N1330 Unspecified hydronephrosis: Secondary | ICD-10-CM | POA: Diagnosis not present

## 2021-11-04 DIAGNOSIS — I110 Hypertensive heart disease with heart failure: Secondary | ICD-10-CM | POA: Diagnosis not present

## 2021-11-04 DIAGNOSIS — I509 Heart failure, unspecified: Secondary | ICD-10-CM | POA: Diagnosis not present

## 2021-11-04 DIAGNOSIS — R7989 Other specified abnormal findings of blood chemistry: Secondary | ICD-10-CM | POA: Diagnosis not present

## 2021-11-04 DIAGNOSIS — E1165 Type 2 diabetes mellitus with hyperglycemia: Secondary | ICD-10-CM | POA: Diagnosis present

## 2021-11-04 DIAGNOSIS — R0602 Shortness of breath: Secondary | ICD-10-CM | POA: Diagnosis not present

## 2021-11-04 DIAGNOSIS — J90 Pleural effusion, not elsewhere classified: Secondary | ICD-10-CM | POA: Diagnosis not present

## 2021-11-04 DIAGNOSIS — I447 Left bundle-branch block, unspecified: Secondary | ICD-10-CM | POA: Diagnosis present

## 2021-11-04 DIAGNOSIS — I5021 Acute systolic (congestive) heart failure: Secondary | ICD-10-CM | POA: Diagnosis not present

## 2021-11-04 DIAGNOSIS — K436 Other and unspecified ventral hernia with obstruction, without gangrene: Secondary | ICD-10-CM | POA: Diagnosis not present

## 2021-11-04 DIAGNOSIS — Z6835 Body mass index (BMI) 35.0-35.9, adult: Secondary | ICD-10-CM | POA: Diagnosis not present

## 2021-11-04 DIAGNOSIS — E669 Obesity, unspecified: Secondary | ICD-10-CM | POA: Diagnosis present

## 2021-11-04 DIAGNOSIS — I482 Chronic atrial fibrillation, unspecified: Secondary | ICD-10-CM | POA: Diagnosis present

## 2021-11-04 DIAGNOSIS — I5043 Acute on chronic combined systolic (congestive) and diastolic (congestive) heart failure: Secondary | ICD-10-CM | POA: Diagnosis present

## 2021-11-04 DIAGNOSIS — Z7901 Long term (current) use of anticoagulants: Secondary | ICD-10-CM | POA: Diagnosis not present

## 2021-11-04 DIAGNOSIS — E11621 Type 2 diabetes mellitus with foot ulcer: Secondary | ICD-10-CM | POA: Diagnosis present

## 2021-11-04 HISTORY — PX: CARDIOVERSION: SHX1299

## 2021-11-04 LAB — GLUCOSE, CAPILLARY
Glucose-Capillary: 100 mg/dL — ABNORMAL HIGH (ref 70–99)
Glucose-Capillary: 100 mg/dL — ABNORMAL HIGH (ref 70–99)

## 2021-11-04 SURGERY — CARDIOVERSION
Anesthesia: General

## 2021-11-04 MED ORDER — PROPOFOL 10 MG/ML IV BOLUS
INTRAVENOUS | Status: DC | PRN
  Administered 2021-11-04: 30 mg via INTRAVENOUS
  Administered 2021-11-04: 10 mg via INTRAVENOUS

## 2021-11-04 MED ORDER — SODIUM CHLORIDE 0.9 % IV SOLN: INTRAVENOUS | Status: DC

## 2021-11-04 MED ORDER — HYDROCORTISONE 1 % EX CREA: 1.0000 "application " | TOPICAL_CREAM | Freq: Three times a day (TID) | CUTANEOUS | Status: DC | PRN

## 2021-11-04 NOTE — Procedures (Signed)
Procedure: Cardioversion Indication: afib  After informed consent, time out protocol and adequate sedation, pt was sedated per department of anesthesia, pt received 120J synchronized, Biphasic dc cardioversion Converted to nsr. No immediate complications.

## 2021-11-04 NOTE — Anesthesia Preprocedure Evaluation (Signed)
Anesthesia Evaluation  Patient identified by MRN, date of birth, ID band Patient awake    Reviewed: Allergy & Precautions, H&P , NPO status , Patient's Chart, lab work & pertinent test results  History of Anesthesia Complications (+) PONV and history of anesthetic complications  Airway Mallampati: III  TM Distance: <3 FB Neck ROM: full    Dental  (+) Teeth Intact, Chipped, Caps   Pulmonary shortness of breath and with exertion, neg sleep apnea, neg COPD, neg recent URI, Not current smoker,           Cardiovascular hypertension, (-) angina(-) Past MI, (-) Cardiac Stents, (-) CABG and (-) CHF + dysrhythmias (on Xarelto, last dose 5 days ago) Atrial Fibrillation      Neuro/Psych H/o spinal stenosis s/p lumbar surgery L3-4 decompression, L5-6 hardware Denies LE weakness/numbness/tingling negative psych ROS   GI/Hepatic Neg liver ROS, GERD  Controlled,  Endo/Other  diabetes, Insulin Dependent  Renal/GU CRFRenal disease     Musculoskeletal  (+) Arthritis , Osteoarthritis,    Abdominal   Peds negative pediatric ROS (+)  Hematology  (+) Blood dyscrasia, anemia ,   Anesthesia Other Findings Obesity  Past Medical History: No date: A-fib (HCC) No date: Anemia No date: Arthritis No date: Cataracts, both eyes No date: Chickenpox No date: Chronic kidney disease No date: Complication of anesthesia No date: Diabetes mellitus without complication (HCC) No date: Dyspnea     Comment:  with exertion No date: Dysrhythmia No date: Hyperlipidemia No date: Hypertension No date: PONV (postoperative nausea and vomiting)     Comment:  with 1st pregnancy 50 years ago and no problem since               then No date: Right leg weakness No date: Spinal stenosis No date: UTI (urinary tract infection)  Past Surgical History: No date: BACK SURGERY     Comment:  x2 -cervical fusion and mid-lower back surgery No date:  CARDIOVERSION No date: CATARACT EXTRACTION, BILATERAL No date: CERVICAL LAMINECTOMY 01/12/2017: ELECTROPHYSIOLOGIC STUDY; N/A     Comment:  Procedure: Cardioversion;  Surgeon: Teodoro Spray, MD;               Location: [facility] ORS;  Service: Cardiovascular;                Laterality: N/A; No date: SHOULDER SURGERY  BMI    Body Mass Index:  37.49 kg/m      Reproductive/Obstetrics negative OB ROS                             Anesthesia Physical  Anesthesia Plan  ASA: 3  Anesthesia Plan: General   Post-op Pain Management:    Induction: Intravenous  PONV Risk Score and Plan: 4 or greater and TIVA and Propofol infusion  Airway Management Planned: Natural Airway and Nasal Cannula  Additional Equipment:   Intra-op Plan:   Post-operative Plan:   Informed Consent: I have reviewed the patients History and Physical, chart, labs and discussed the procedure including the risks, benefits and alternatives for the proposed anesthesia with the patient or authorized representative who has indicated his/her understanding and acceptance.     Dental Advisory Given  Plan Discussed with: Anesthesiologist and CRNA  Anesthesia Plan Comments:         Anesthesia Quick Evaluation

## 2021-11-04 NOTE — Anesthesia Procedure Notes (Signed)
Date/Time: 11/04/2021 7:53 AM Performed by: Johnna Acosta, CRNA Pre-anesthesia Checklist: Patient identified, Emergency Drugs available, Suction available, Patient being monitored and Timeout performed Patient Re-evaluated:Patient Re-evaluated prior to induction Oxygen Delivery Method: Nasal cannula Preoxygenation: Pre-oxygenation with 100% oxygen Induction Type: IV induction

## 2021-11-04 NOTE — Transfer of Care (Signed)
Immediate Anesthesia Transfer of Care Note  Patient: Tiffany [name]  Procedure(s) Performed: CARDIOVERSION  Patient Location: PACU  Anesthesia Type:General  Level of Consciousness: drowsy  Airway & Oxygen Therapy: Patient Spontanous Breathing and Patient connected to nasal cannula oxygen  Post-op Assessment: Report given to RN and Post -op Vital signs reviewed and stable  Post vital signs: Reviewed and stable  Last Vitals:  Vitals Value Taken Time  BP 147/81 11/04/21 0800  Temp    Pulse 70 11/04/21 0802  Resp 32 11/04/21 0802  SpO2 95 % 11/04/21 0802    Last Pain:  Vitals:   11/04/21 0726  TempSrc: Oral  PainSc: 0-No pain         Complications: No notable events documented.

## 2021-11-04 NOTE — Hospital Course (Signed)
Procedure: Cardioversion Indication: afib  After informed consent, time out protocol and adequate sedation, pt was sedated per department of anesthesia, pt received 120J synchronized, Biphasic dc cardioversion Converted to nsr. No immediate complications.

## 2021-11-04 NOTE — H&P (Signed)
Chief Complaint: Chief Complaint  Patient presents with   multiple issues  Date of Service: 10/27/2021 Date of Birth: 10-08-1933 PCP: Cheryll Cockayne, MD  History of Present Illness: Tiffany [name] is a 87 y.o.female patient who returns for follow-up visit. She had called on Friday complaining per her husband that she had recurrent A. fib. He has been using flecainide as needed when she is in A. fib. He gave her flecainide 100 twice daily over the weekend and she is converted back to sinus rhythm. She still feels somewhat fatigued and shortness of breath. She is a somewhat limited historian. Most of the history comes from her husband who is retired Engineer, drilling. EKG today reveals sinus rhythm at a rate of 76 bpm with a left bundle branch block pattern. PR interval 240 ms QRS duration 172 ms with a QTC of 546 ms. She has a history of paroxysmal atrial fibrillation. She recently had an echocardiogram completed which revealed mildly reduced LV function EF around 40% with evidence of left bundle branch block septal hypokinesis. Previous echoes were similar. She denies chest pain but complains of shortness of breath and chronic lower extremity edema. Past Medical and Surgical History  Past Medical History Past Medical History:  Diagnosis Date   Anemia   Atrial fibrillation (CMS-HCC)   Bilateral cataracts   Chickenpox   Chronic kidney disease, stage III   DOE (dyspnea on exertion) 02/04/2015   Hyperlipidemia   Hypertension   Osteoarthritis   Right leg weakness   Spinal stenosis   Type 2 diabetes mellitus (CMS-HCC)   Urinary tract bacterial infections   Past Surgical History She has a past surgical history that includes Back surgery (02/03/2010); Shoulder arthroscopy distal clavicle excision and open rotator cuff repair; Cataract extraction (Bilateral); Cervical laminectomy; Cardioversion External; Left TKA using all cemented biomet vanguard system with a 70 mm PCR femur a 75 mm tibial tray with a  10 mm anterior e-poly insert and a 34 x 8.5 mm all poly 3 -pegged domed patella (Left, 05/23/2019); Spine surgery; and Anterior fusion cervical spine.   Medications and Allergies  Current Medications  Current Outpatient Medications  Medication Sig Dispense Refill   albuterol (PROAIR RESPICLICK) 90 mcg/actuation inhaler Inhale 2 inhalations into the lungs 4 (four) times daily as needed 1 each 11   blood glucose diagnostic test strip Use 2 (two) times daily Use as instructed. 200 each 3   cephalexin (KEFLEX) 500 MG capsule   CONCENTRATED insulin glargine (TOUJEO SOLOSTAR U-300 INSULIN) pen injector (concentration 300 units/mL) 8 units in the morning and 20 units at night (Patient taking differently: Inject 11 Units subcutaneously every morning) 4.5 mL 3   flecainide (TAMBOCOR) 100 MG tablet Take 100 mg by mouth 2 (two) times daily   glimepiride (AMARYL) 2 MG tablet Take 1 tablet (2 mg total) by mouth daily with breakfast 90 tablet 3   HYDROcodone-acetaminophen (NORCO) 5-325 mg tablet Take 0.5 tablets by mouth every 8 (eight) hours as needed for Pain 40 tablet 0   HYDROcodone-acetaminophen (NORCO) 5-325 mg tablet Take 0.5 tablets by mouth every 8 (eight) hours as needed for Pain 40 tablet 0   insulin ASPART (NOVOLOG FLEXPEN U-100 INSULIN) pen injector (concentration 100 units/mL) Take 9 units before supper. Take 10 minutes before the meal. (Patient taking differently: 4 Units Take 4 units before supper. Take 10 minutes before the meal.) 15 mL 5   metoprolol succinate (TOPROL-XL) 25 MG XL tablet Take 1 tablet (25 mg total) by  mouth once daily 90 tablet 3   pen needle, diabetic (BD ULTRA-FINE NANO PEN NEEDLE) 32 gauge x 5/32" Ndle USE WITH INSULIN PEN TWICE DAILY OR AS DIRECTED 200 each 3   polyethylene glycol (MIRALAX) powder Take 17 g by mouth once daily Mix in 4-8ounces of fluid prior to taking.   predniSONE (DELTASONE) 5 MG tablet Take 1 tablet (5 mg total) by mouth 2 (two) times daily 180 tablet 3    rivaroxaban (XARELTO) 20 mg tablet Take 1 tablet (20 mg total) by mouth daily with breakfast 90 tablet 3   TORsemide (DEMADEX) 20 MG tablet TAKE 1/2 TABLET(10 MG) BY MOUTH EVERY DAY 45 tablet 3   No current facility-administered medications for this visit.   Allergies: Amlodipine, Codeine, Doxycycline, and Tizanidine  Social and Family History  Social History reports that she has never smoked. She has never used smokeless tobacco. She reports that she does not drink alcohol and does not use drugs.  Family History Family History  Adopted: Yes  Family history unknown: Yes   Review of Systems  Review of Systems  Constitutional: Negative for chills, diaphoresis, fever and weight loss.  HENT: Negative for congestion, ear discharge, hearing loss and tinnitus.  Eyes: Negative for blurred vision.  Respiratory: Positive for shortness of breath. Negative for cough, hemoptysis, sputum production and wheezing.  Cardiovascular: Positive for palpitations and leg swelling. Negative for chest pain, orthopnea, claudication and PND.  Gastrointestinal: Negative for abdominal pain, blood in stool, constipation, diarrhea, heartburn, melena, nausea and vomiting.  Genitourinary: Negative for dysuria, frequency, hematuria and urgency.  Musculoskeletal: Positive for joint pain. Negative for back pain, falls and myalgias.  Skin: Negative for itching and rash.  Neurological: Positive for weakness. Negative for dizziness, tingling, focal weakness, loss of consciousness and headaches.  Endo/Heme/Allergies: Negative for polydipsia. Does not bruise/bleed easily.  Psychiatric/Behavioral: Negative for depression, memory loss and substance abuse. The patient is not nervous/anxious.    Physical Examination   Vitals:BP (!) 144/84  Pulse 78  Resp 16  Ht 157.5 cm (5\' 2" )  Wt 92.1 kg (203 lb)  SpO2 97%  BMI 37.13 kg/m  Ht:157.5 cm (5\' 2" ) Wt:92.1 kg (203 lb) UXL:KGMW surface area is 2.01 meters squared. Body  mass index is 37.13 kg/m.  Wt Readings from Last 3 Encounters:  10/27/21 92.1 kg (203 lb)  10/01/21 89.4 kg (197 lb)  09/11/21 89.4 kg (197 lb)   BP Readings from Last 3 Encounters:  10/27/21 (!) 144/84  09/11/21 (!) 163/93  08/27/21 130/89   General appearance appears in no acute distress  Head Mouth and Eye exam Normocephalic, without obvious abnormality, atraumatic Dentition is good Eyes appear anicteric   LUNGS Breath Sounds: Normal Percussion: Normal  CARDIOVASCULAR JVP CV wave: no HJR: no Elevation at 90 degrees: None Carotid Pulse: normal pulsation bilaterally Bruit: None Apex: apical impulse normal  Auscultation Rhythm: normal sinus rhythm S1: normal S2: normal Clicks: no Rub: no Murmurs: 1/6 to 2/6 systolic murmur radiating to the left lower sternal border and axilla. Gallop: None  EXTREMITIES Clubbing: no Edema: 2-3 + bilateral pedal edema Pulses: peripheral pulses symmetrical Femoral Bruits: no Amputation: no SKIN Rash: no Cyanosis: no Embolic phemonenon: no Bruising: no NEURO Alert and Oriented to person, place and time: yes Non focal: yes  PSYCH: Pt appears to have normal affect  LABS REVIEWED Last 3 CBC results: Lab Results  Component Value Date  WBC 8.2 08/20/2021  WBC 9.0 04/15/2021  WBC 10.6 (H) 01/20/2021  Lab Results  Component Value Date  HGB 12.4 08/20/2021  HGB 12.5 04/15/2021  HGB 14.0 01/20/2021   Lab Results  Component Value Date  HCT 39.6 08/20/2021  HCT 39.7 04/15/2021  HCT 43.6 01/20/2021   Lab Results  Component Value Date  PLT 229 08/20/2021  PLT 261 04/15/2021  PLT 291 01/20/2021   Lab Results  Component Value Date  CREATININE 1.8 (H) 08/20/2021  BUN 42 (H) 08/20/2021  NA 142 08/20/2021  K 4.4 08/20/2021  CL 105 08/20/2021  CO2 30.9 08/20/2021   Lab Results  Component Value Date  HGBA1C 7.5 (H) 08/20/2021   Lab Results  Component Value Date  ALT 10 08/20/2021  AST 10 08/20/2021   ALKPHOS 68 08/20/2021   Lab Results  Component Value Date  TSH 3.847 08/20/2021   Assessment and Plan   87 y.o. female with  ICD-10-CM ICD-9-CM  1. Essential hypertension-blood pressure is controlled currently with metoprolol for now. We will continue with current medications until labs are back. I10 401.9  2. Paroxysmal atrial fibrillation-currently back in afib. She stopped flecainide and was started on amiodarone. Will continue to anticoagulate with Xarelto. I48.0 427.31 Will attempt cardioversion if not chemically converted.  3. Mixed hyperlipidemia the-continue with diet control E78.2 272.2  4. CKD (chronic kidney disease) stage 4, GFR 15 we will check her renal function today to guide therapy with recommendations. N18.3 585.3  5. Diabetes mellitus type 2, uncomplicated-continue with metformin and glimepiride with a hemoglobin A1c goal of less than 6. E11.9 250.00     These notes generated with voice recognition software. I apologize for typographical errors.  Sydnee Levans, MD  Pt seen and examined. No change from above.

## 2021-11-05 ENCOUNTER — Emergency Department: Payer: PPO

## 2021-11-05 ENCOUNTER — Inpatient Hospital Stay: Payer: PPO

## 2021-11-05 ENCOUNTER — Inpatient Hospital Stay
Admission: EM | Admit: 2021-11-05 | Discharge: 2021-11-07 | DRG: 291 | Disposition: A | Discharge status: Home / Self Care | Payer: PPO | Attending: Student in an Organized Health Care Education/Training Program | Admitting: Student in an Organized Health Care Education/Training Program

## 2021-11-05 ENCOUNTER — Encounter: Payer: Self-pay | Admitting: Emergency Medicine

## 2021-11-05 ENCOUNTER — Other Ambulatory Visit: Payer: Self-pay

## 2021-11-05 DIAGNOSIS — Z79899 Other long term (current) drug therapy: Secondary | ICD-10-CM | POA: Diagnosis not present

## 2021-11-05 DIAGNOSIS — R1030 Lower abdominal pain, unspecified: Secondary | ICD-10-CM | POA: Diagnosis not present

## 2021-11-05 DIAGNOSIS — K439 Ventral hernia without obstruction or gangrene: Secondary | ICD-10-CM

## 2021-11-05 DIAGNOSIS — Z6835 Body mass index (BMI) 35.0-35.9, adult: Secondary | ICD-10-CM | POA: Diagnosis not present

## 2021-11-05 DIAGNOSIS — I130 Hypertensive heart and chronic kidney disease with heart failure and stage 1 through stage 4 chronic kidney disease, or unspecified chronic kidney disease: Secondary | ICD-10-CM | POA: Diagnosis present

## 2021-11-05 DIAGNOSIS — Z20822 Contact with and (suspected) exposure to covid-19: Secondary | ICD-10-CM | POA: Diagnosis present

## 2021-11-05 DIAGNOSIS — Z7984 Long term (current) use of oral hypoglycemic drugs: Secondary | ICD-10-CM

## 2021-11-05 DIAGNOSIS — Z885 Allergy status to narcotic agent status: Secondary | ICD-10-CM

## 2021-11-05 DIAGNOSIS — I447 Left bundle-branch block, unspecified: Secondary | ICD-10-CM | POA: Diagnosis present

## 2021-11-05 DIAGNOSIS — I509 Heart failure, unspecified: Secondary | ICD-10-CM

## 2021-11-05 DIAGNOSIS — D72829 Elevated white blood cell count, unspecified: Secondary | ICD-10-CM | POA: Diagnosis present

## 2021-11-05 DIAGNOSIS — I482 Chronic atrial fibrillation, unspecified: Secondary | ICD-10-CM | POA: Diagnosis present

## 2021-11-05 DIAGNOSIS — I5023 Acute on chronic systolic (congestive) heart failure: Secondary | ICD-10-CM

## 2021-11-05 DIAGNOSIS — J90 Pleural effusion, not elsewhere classified: Secondary | ICD-10-CM | POA: Diagnosis not present

## 2021-11-05 DIAGNOSIS — E11621 Type 2 diabetes mellitus with foot ulcer: Secondary | ICD-10-CM | POA: Diagnosis present

## 2021-11-05 DIAGNOSIS — N131 Hydronephrosis with ureteral stricture, not elsewhere classified: Secondary | ICD-10-CM | POA: Diagnosis present

## 2021-11-05 DIAGNOSIS — M1A9XX1 Chronic gout, unspecified, with tophus (tophi): Secondary | ICD-10-CM | POA: Diagnosis present

## 2021-11-05 DIAGNOSIS — G8929 Other chronic pain: Secondary | ICD-10-CM | POA: Diagnosis present

## 2021-11-05 DIAGNOSIS — Z9841 Cataract extraction status, right eye: Secondary | ICD-10-CM

## 2021-11-05 DIAGNOSIS — E1165 Type 2 diabetes mellitus with hyperglycemia: Secondary | ICD-10-CM | POA: Diagnosis present

## 2021-11-05 DIAGNOSIS — I248 Other forms of acute ischemic heart disease: Secondary | ICD-10-CM | POA: Diagnosis present

## 2021-11-05 DIAGNOSIS — Z881 Allergy status to other antibiotic agents status: Secondary | ICD-10-CM | POA: Diagnosis not present

## 2021-11-05 DIAGNOSIS — Z981 Arthrodesis status: Secondary | ICD-10-CM

## 2021-11-05 DIAGNOSIS — E782 Mixed hyperlipidemia: Secondary | ICD-10-CM | POA: Diagnosis present

## 2021-11-05 DIAGNOSIS — R5381 Other malaise: Secondary | ICD-10-CM | POA: Diagnosis present

## 2021-11-05 DIAGNOSIS — R310 Gross hematuria: Secondary | ICD-10-CM | POA: Diagnosis not present

## 2021-11-05 DIAGNOSIS — Z888 Allergy status to other drugs, medicaments and biological substances status: Secondary | ICD-10-CM

## 2021-11-05 DIAGNOSIS — Z96652 Presence of left artificial knee joint: Secondary | ICD-10-CM | POA: Diagnosis present

## 2021-11-05 DIAGNOSIS — I5043 Acute on chronic combined systolic (congestive) and diastolic (congestive) heart failure: Secondary | ICD-10-CM | POA: Diagnosis present

## 2021-11-05 DIAGNOSIS — I110 Hypertensive heart disease with heart failure: Secondary | ICD-10-CM | POA: Diagnosis not present

## 2021-11-05 DIAGNOSIS — N184 Chronic kidney disease, stage 4 (severe): Secondary | ICD-10-CM | POA: Diagnosis present

## 2021-11-05 DIAGNOSIS — M545 Low back pain, unspecified: Secondary | ICD-10-CM | POA: Diagnosis present

## 2021-11-05 DIAGNOSIS — D689 Coagulation defect, unspecified: Secondary | ICD-10-CM | POA: Diagnosis present

## 2021-11-05 DIAGNOSIS — R0602 Shortness of breath: Secondary | ICD-10-CM | POA: Diagnosis not present

## 2021-11-05 DIAGNOSIS — L97529 Non-pressure chronic ulcer of other part of left foot with unspecified severity: Secondary | ICD-10-CM | POA: Diagnosis present

## 2021-11-05 DIAGNOSIS — E669 Obesity, unspecified: Secondary | ICD-10-CM | POA: Diagnosis present

## 2021-11-05 DIAGNOSIS — R339 Retention of urine, unspecified: Secondary | ICD-10-CM | POA: Diagnosis not present

## 2021-11-05 DIAGNOSIS — Z7901 Long term (current) use of anticoagulants: Secondary | ICD-10-CM | POA: Diagnosis not present

## 2021-11-05 DIAGNOSIS — E1122 Type 2 diabetes mellitus with diabetic chronic kidney disease: Secondary | ICD-10-CM | POA: Diagnosis present

## 2021-11-05 DIAGNOSIS — Z794 Long term (current) use of insulin: Secondary | ICD-10-CM | POA: Diagnosis not present

## 2021-11-05 DIAGNOSIS — Z7951 Long term (current) use of inhaled steroids: Secondary | ICD-10-CM | POA: Diagnosis not present

## 2021-11-05 DIAGNOSIS — R0603 Acute respiratory distress: Secondary | ICD-10-CM | POA: Diagnosis present

## 2021-11-05 DIAGNOSIS — Z9842 Cataract extraction status, left eye: Secondary | ICD-10-CM

## 2021-11-05 LAB — COMPREHENSIVE METABOLIC PANEL
ALT: 10 U/L (ref 0–44)
AST: 14 U/L — ABNORMAL LOW (ref 15–41)
Albumin: 3.7 g/dL (ref 3.5–5.0)
Alkaline Phosphatase: 82 U/L (ref 38–126)
Anion gap: 10 (ref 5–15)
BUN: 33 mg/dL — ABNORMAL HIGH (ref 8–23)
CO2: 25 mmol/L (ref 22–32)
Calcium: 8.9 mg/dL (ref 8.9–10.3)
Chloride: 104 mmol/L (ref 98–111)
Creatinine, Ser: 2.31 mg/dL — ABNORMAL HIGH (ref 0.44–1.00)
GFR, Estimated: 20 mL/min — ABNORMAL LOW (ref >60)
Glucose, Bld: 137 mg/dL — ABNORMAL HIGH (ref 70–99)
Potassium: 4.4 mmol/L (ref 3.5–5.1)
Sodium: 139 mmol/L (ref 135–145)
Total Bilirubin: 1 mg/dL (ref 0.3–1.2)
Total Protein: 7.2 g/dL (ref 6.5–8.1)

## 2021-11-05 LAB — CBC
HCT: 38.7 % (ref 36.0–46.0)
Hemoglobin: 12.1 g/dL (ref 12.0–15.0)
MCH: 27.4 pg (ref 26.0–34.0)
MCHC: 31.3 g/dL (ref 30.0–36.0)
MCV: 87.8 fL (ref 80.0–100.0)
Platelets: 324 10*3/uL (ref 150–400)
RBC: 4.41 MIL/uL (ref 3.87–5.11)
RDW: 14.3 % (ref 11.5–15.5)
WBC: 17.1 10*3/uL — ABNORMAL HIGH (ref 4.0–10.5)
nRBC: 0 % (ref 0.0–0.2)

## 2021-11-05 LAB — TROPONIN I (HIGH SENSITIVITY)
Troponin I (High Sensitivity): 14 ng/L (ref <18)
Troponin I (High Sensitivity): 28 ng/L — ABNORMAL HIGH (ref <18)
Troponin I (High Sensitivity): 116 ng/L (ref <18)
Troponin I (High Sensitivity): 168 ng/L (ref <18)

## 2021-11-05 LAB — BRAIN NATRIURETIC PEPTIDE: B Natriuretic Peptide: 228.7 pg/mL — ABNORMAL HIGH (ref 0.0–100.0)

## 2021-11-05 LAB — RESP PANEL BY RT-PCR (FLU A&B, COVID) ARPGX2
Influenza A by PCR: NEGATIVE
Influenza B by PCR: NEGATIVE
SARS Coronavirus 2 by RT PCR: NEGATIVE

## 2021-11-05 LAB — HEMOGLOBIN A1C
Hgb A1c MFr Bld: 6.7 % — ABNORMAL HIGH (ref 4.8–5.6)
Mean Plasma Glucose: 145.59 mg/dL

## 2021-11-05 LAB — GLUCOSE, CAPILLARY: Glucose-Capillary: 162 mg/dL — ABNORMAL HIGH (ref 70–99)

## 2021-11-05 LAB — PROTIME-INR
INR: 3.3 — ABNORMAL HIGH (ref 0.8–1.2)
Prothrombin Time: 33.2 seconds — ABNORMAL HIGH (ref 11.4–15.2)

## 2021-11-05 LAB — LACTIC ACID, PLASMA: Lactic Acid, Venous: 2.2 mmol/L (ref 0.5–1.9)

## 2021-11-05 MED ORDER — INSULIN ASPART 100 UNIT/ML IJ SOLN: 0.0000 [IU] | Freq: Three times a day (TID) | INTRAMUSCULAR | Status: DC

## 2021-11-05 MED ORDER — AMIODARONE HCL 200 MG PO TABS
200.0000 mg | ORAL_TABLET | Freq: Two times a day (BID) | ORAL | Status: DC
  Administered 2021-11-05 – 2021-11-07 (×4): 200 mg via ORAL
  Filled 2021-11-05 (×4): qty 1

## 2021-11-05 MED ORDER — IPRATROPIUM-ALBUTEROL 0.5-2.5 (3) MG/3ML IN SOLN
3.0000 mL | Freq: Once | RESPIRATORY_TRACT | Status: AC
  Administered 2021-11-05: 3 mL via RESPIRATORY_TRACT

## 2021-11-05 MED ORDER — IPRATROPIUM-ALBUTEROL 0.5-2.5 (3) MG/3ML IN SOLN
3.0000 mL | Freq: Once | RESPIRATORY_TRACT | Status: AC
  Administered 2021-11-05: 3 mL via RESPIRATORY_TRACT
  Filled 2021-11-05: qty 6

## 2021-11-05 MED ORDER — INSULIN ASPART 100 UNIT/ML IJ SOLN: 0.0000 [IU] | Freq: Every day | INTRAMUSCULAR | Status: DC

## 2021-11-05 MED ORDER — ACETAMINOPHEN 500 MG PO TABS
500.0000 mg | ORAL_TABLET | Freq: Four times a day (QID) | ORAL | Status: DC | PRN
  Administered 2021-11-07: 500 mg via ORAL
  Filled 2021-11-05: qty 1

## 2021-11-05 MED ORDER — RIVAROXABAN 15 MG PO TABS
15.0000 mg | ORAL_TABLET | Freq: Every day | ORAL | Status: DC
  Administered 2021-11-06 – 2021-11-07 (×2): 15 mg via ORAL
  Filled 2021-11-05 (×2): qty 1

## 2021-11-05 MED ORDER — HYDROCODONE-ACETAMINOPHEN 5-325 MG PO TABS
0.5000 | ORAL_TABLET | Freq: Two times a day (BID) | ORAL | Status: DC | PRN
  Administered 2021-11-05 – 2021-11-07 (×2): 0.5 via ORAL
  Filled 2021-11-05 (×2): qty 1

## 2021-11-05 MED ORDER — FUROSEMIDE 10 MG/ML IJ SOLN
40.0000 mg | Freq: Two times a day (BID) | INTRAMUSCULAR | Status: AC
  Administered 2021-11-05 – 2021-11-06 (×2): 40 mg via INTRAVENOUS
  Filled 2021-11-05 (×2): qty 4

## 2021-11-05 MED ORDER — FUROSEMIDE 10 MG/ML IJ SOLN
60.0000 mg | Freq: Once | INTRAMUSCULAR | Status: AC
  Administered 2021-11-05: 60 mg via INTRAVENOUS
  Filled 2021-11-05: qty 8

## 2021-11-05 MED ORDER — RIVAROXABAN 20 MG PO TABS: 20.0000 mg | ORAL_TABLET | Freq: Every day | ORAL | Status: DC

## 2021-11-05 MED ORDER — METOPROLOL SUCCINATE ER 25 MG PO TB24
25.0000 mg | ORAL_TABLET | Freq: Every day | ORAL | Status: DC
  Administered 2021-11-06 – 2021-11-07 (×2): 25 mg via ORAL
  Filled 2021-11-05 (×2): qty 1

## 2021-11-05 NOTE — ED Notes (Signed)
Pure wick placed at this time. Call light within reach. Family at bedside. Pt has no further needs at this time.

## 2021-11-05 NOTE — Progress Notes (Signed)
Foley insertion ordered due to concern for urinary retention with B/L hydronephrosis.  Discussed with Urology, Dr. Diamantina Providence, ok to resume home DOAC, will see in consultation.

## 2021-11-05 NOTE — Progress Notes (Signed)
Brief HPI:  Pt seen by myself in the office per request of family member for concern of incarcerated ventral hernia.  Upon arrival to clinic, patient in obvious respiratory distress with tachypnea, audible wheezing, accessory muscle use.  Albuterol supposedly given en route to clinic with no change in respiratory status.  Quick bedside exam of hernia noted soft and partially reducible hernia, with no overlying skin changes and no TTP.  The hernia unable to be completely reduced due to patient intolerance of laying supine, and accessory muscle use pushing the hernia immediately back out.  Unable to obtain O2sat read.  Due to clinical concern for impending respiratory failure, Pt transferred emergently to ED unit where warm hand off provided to ED provider.

## 2021-11-05 NOTE — H&P (Addendum)
History and Physical  Tiffany [name] FWY:[id number] DOB: [birth year] DOA: 11/05/2021  Referring physician: Dr. Kerman Passey, [place]  PCP: Adin Hector, MD  Outpatient Specialists: Cardiology, urology, general surgery. Patient coming from: Home via general surgery's office.  Chief Complaint: Shortness of breath  HPI: Tiffany [name] is a 87 y.o. female with medical history significant for chronic A. fib status post cardioversion on 11/04/2021, on Xarelto, chronic systolic CHF, type 2 diabetes, essential hypertension, hyperlipidemia, ventral hernia for 2 to 3 years, chronic lower back pain, who presented to [facility] ED from general surgery's office due to acute respiratory distress.  Patient was in her usual state of health up until a week ago when she just was not feeling good.  She had no fevers.  She was found to be in A. fib with RVR per her husband who is a retired Engineer, drilling at bedside.  She is on torsemide and has been taking 10 mg every other day.  Her cardiologist was informed of her RVR and the plan was to get cardioversion which was completed yesterday.  Patient went to bed last night without any issues, she slept well.  However this morning, after having a bowel movement she noted more distention of her ventral hernia.  Associated with worsening breathing symptoms.  She went to see general surgery and in the office, she was in acute respiratory distress.  Was dyspneic with minimal movement, tachypneic with respiration rate in the 30s, and using accessory muscles to breathe.  Patient was advised to go to the ED for further evaluation and management.  Upon presentation to the ED, patient appeared volume overload.  O2 saturation was 90% on room air improved to upper 90s on 2 L.  She was given 60 mg of IV Lasix x1 which resulted in improvement of her symptomatology.  A chest x-ray was done which revealed pulmonary edema and small left pleural effusion.  Work-up in the ED revealed acute CHF.  TRH was  asked to admit for further management.  ED Course: Temperature 99.3.  BP 162/92, pulse 101, respiration rate 34, O2 saturation 97% on 2 L.  Lab studies remarkable for serum creatinine 2.31 with GFR of 20.  BNP 228.  Lactic acid 2.2.  WBC 17.1.  Review of Systems: Review of systems as noted in the HPI. All other systems reviewed and are negative.   Past Medical History:  Diagnosis Date   A-fib ([place])    Anemia    Arthritis    Cataracts, both eyes    Chickenpox    Chronic kidney disease    Complication of anesthesia    Diabetes mellitus without complication ([place])    Dyspnea    with exertion   Dysrhythmia    Hyperlipidemia    Hypertension    PONV (postoperative nausea and vomiting)    with 1st pregnancy 50 years ago and no problem since then   Right leg weakness    Spinal stenosis    UTI (urinary tract infection)    Past Surgical History:  Procedure Laterality Date   BACK SURGERY     x2 -cervical fusion and mid-lower back surgery   CARDIOVERSION     CARDIOVERSION N/A 11/04/2021   Procedure: CARDIOVERSION;  Surgeon: Teodoro Spray, MD;  Location: [facility] ORS;  Service: Cardiovascular;  Laterality: N/A;   CATARACT EXTRACTION, BILATERAL     CERVICAL [place]  12/06/2020   Procedure: CYSTOSCOPY WITH RETROGRADE PYELOGRAM;  Surgeon: Diamantina Providence,  Herbert Seta, MD;  Location: [facility] ORS;  Service: Urology;;   CYSTOSCOPY WITH STENT PLACEMENT Left 12/06/2020   Procedure: CYSTOSCOPY WITH STENT PLACEMENT;  Surgeon: Billey Co, MD;  Location: [facility] ORS;  Service: Urology;  Laterality: Left;   ELECTROPHYSIOLOGIC STUDY N/A 01/12/2017   Procedure: Cardioversion;  Surgeon: Teodoro Spray, MD;  Location: [facility] ORS;  Service: Cardiovascular;  Laterality: N/A;   EYE SURGERY     JOINT REPLACEMENT     SHOULDER SURGERY Right 2010   rotator cuff repair   TOTAL KNEE ARTHROPLASTY Left 05/23/2019   Procedure: TOTAL KNEE ARTHROPLASTY - LEFT - DIABETIC;  Surgeon: Corky Mull, MD;  Location: [facility] ORS;  Service: Orthopedics;  Laterality: Left;   URETEROSCOPY N/A 12/06/2020   Procedure: DIAGNOSTIC URETEROSCOPY;  Surgeon: Billey Co, MD;  Location: [facility] ORS;  Service: Urology;  Laterality: N/A;    Social History:  reports that she has never smoked. She has never used smokeless tobacco. She reports that she does not drink alcohol and does not use drugs.   Allergies  Allergen Reactions   Amlodipine Swelling   Codeine Nausea And Vomiting   Tizanidine     Didn't feel good taking it   Doxycycline Nausea And Vomiting    Family History  Adopted: Yes    Patient is adopted.  Prior to Admission medications   Medication Sig Start Date End Date Taking? Authorizing Provider  acetaminophen (TYLENOL) 500 MG tablet Take 500 mg by mouth every 6 (six) hours as needed (pain).     [provider]  amiodarone (PACERONE) 200 MG tablet Take 200 mg by mouth 2 (two) times daily.    [provider]  glimepiride (AMARYL) 2 MG tablet Take 2 mg by mouth daily with breakfast.    [provider]  glucose blood (PRECISION QID TEST) test strip Use 2 (two) times daily Use as instructed. 05/03/20   [provider]  HYDROcodone-acetaminophen (NORCO/VICODIN) 5-325 MG tablet Take 0.5 tablets by mouth 2 (two) times daily as needed for moderate pain.    [provider]  insulin glargine, 1 Unit Dial, (TOUJEO SOLOSTAR) 300 UNIT/ML Solostar Pen Inject 11 Units into the skin daily. 10/24/20   [provider]  Insulin Pen Needle (BD PEN NEEDLE NANO U/F) 32G X 4 MM MISC USE WITH INSULIN PEN TWICE DAILY OR AS DIRECTED 10/18/19   [provider]  metoprolol succinate (TOPROL-XL) 25 MG 24 hr tablet Take 25 mg by mouth daily.    [provider]  NOVOLOG FLEXPEN 100 UNIT/ML FlexPen Inject 3-7 Units into the skin daily before supper. 08/30/20   [provider]  polyethylene glycol (MIRALAX / GLYCOLAX) 17 g packet Take 17 g  by mouth daily as needed for moderate constipation.    [provider]  rivaroxaban (XARELTO) 20 MG TABS tablet Take 20 mg by mouth daily with breakfast.    [provider]  torsemide (DEMADEX) 20 MG tablet Take 10 mg by mouth daily as needed (swelling). 01/04/21   [provider]    Physical Exam: BP (!) 173/97   Pulse (!) 101   Temp 98.2 F (36.8 C) (Oral)   Resp (!) 30   Ht 5\' 4"  (1.626 m)   Wt 92.5 kg   SpO2 97%   BMI 35.02 kg/m   General: 87 y.o. year-old female well developed well nourished in no acute distress.  Alert and oriented x3. H EENT: Normocephalic, no abnormalities noted. Cardiovascular: Regular rate  and rhythm with no rubs or gallops.  No thyromegaly or JVD noted.  1+ pitting edema in lower extremity bilaterally. 2/4 pulses in all 4 extremities. Respiratory: Mild diffuse rales bilaterally.  No wheezing noted. Good inspiratory effort. Abdomen: Soft nontender nondistended with normal bowel sounds x4 quadrants. Muskuloskeletal: No cyanosis or clubbing.  1+ pitting edema lower extremities bilaterally.   Neuro: CN II-XII intact, strength, sensation, reflexes Skin: No ulcerative lesions noted or rashes Psychiatry: Judgement and insight appear normal. Mood is appropriate for condition and setting.         Labs on Admission:  Basic Metabolic Panel: Recent Labs  Lab 11/05/21 1252  NA 139  K 4.4  CL 104  CO2 25  GLUCOSE 137*  BUN 33*  CREATININE 2.31*  CALCIUM 8.9   Liver Function Tests: Recent Labs  Lab 11/05/21 1252  AST 14*  ALT 10  ALKPHOS 82  BILITOT 1.0  PROT 7.2  ALBUMIN 3.7   No results for input(s): LIPASE, AMYLASE in the last 168 hours. No results for input(s): AMMONIA in the last 168 hours. CBC: Recent Labs  Lab 11/05/21 1249  WBC 17.1*  HGB 12.1  HCT 38.7  MCV 87.8  PLT 324   Cardiac Enzymes: No results for input(s): CKTOTAL, CKMB, CKMBINDEX, TROPONINI in the last 168 hours.  BNP (last 3 results) Recent  Labs    10/27/21 0958 11/05/21 1249  BNP 199.8* 228.7*    ProBNP (last 3 results) No results for input(s): PROBNP in the last 8760 hours.  CBG: Recent Labs  Lab 11/04/21 0739 11/04/21 0811  GLUCAP 100* 100*    Radiological Exams on Admission: DG Chest Portable 1 View  Result Date: 11/05/2021 CLINICAL DATA:  Shortness of breath, respiratory distress dress, history of hernia, diabetes mellitus, hypertension, atrial fibrillation EXAM: PORTABLE CHEST 1 VIEW COMPARISON:  Portable exam 1258 hours compared to 04/19/2012 FINDINGS: Normal heart size, mediastinal contours, and pulmonary vascularity. Sir aorta Bronchitic changes with scattered interstitial infiltrates in both lungs, could represent pulmonary edema or atypical infection. Tiny LEFT pleural effusion. No pneumothorax. Scattered endplate spur formation thoracic spine. IMPRESSION: Bronchitic changes with diffuse interstitial infiltrates, question pulmonary edema versus atypical infection. Tiny LEFT pleural effusion. Electronically Signed   By: Lavonia Dana M.D.   On: 11/05/2021 13:07    EKG: I independently viewed the EKG done and my findings are as followed: Atrial fibrillation rate of 99, nonspecific ST-T changes.  QTc 433.  Assessment/Plan Present on Admission:  Acute on chronic systolic CHF (congestive heart failure) (HCC)  Active Problems:   Acute on chronic systolic CHF (congestive heart failure) (HCC)  Acute on chronic systolic CHF, possibly secondary to nonadequate home diuresis. Patient on home torsemide which she was taking 10 mg every other day. Presented with elevated BNP, pulmonary edema and left pleural effusion seen on chest x-ray, personally reviewed. Obtain 2D echo Start strict I's and O's and daily weight Cardiology consulted.  Leukocytosis, suspect reactive in the setting of symptomatic ventral hernia, rule out acute infective process. Presented with WBC 17,000. Obtain UA Obtain procalcitonin Monitor for  now, if the above is positive treat Monitor fever curve and WBC  Elevated troponin suspect demand ischemia from respiratory distress Troponin 28, initial troponin was 14, normal Repeat troponin and closely monitor on telemetry Repeat 12 lead EKG with increase in troponin Cardiology consulted  CKD 4 Appears to be at her baseline creatinine 2.3 with GFR of 20 Avoid nephrotoxic agents, and hypotension. Closely monitor urine output and  electrolytes Repeat BMP in the morning.  Chronic A. fib with cardioversion on 11/04/2021 Management per cardiology She is on amiodarone and metoprolol She is also on Xarelto for CVA prevention Resume home regimen  Type 2 diabetes with hyperglycemia Update hemoglobin A1c Start insulin sliding scale  Ventral hernia with concern for incarceration CT abdomen and pelvis without contrast Sent from General surgery's office Pain control Close monitoring  General surgery consulted  Coagulopathy INR 3.3 closely monitor while on xarelto Dose adjusted for renal clearance Repeat INR AM  Chronic back pain post back surgery Resume home pain management Bowel regimen to avoid opioid-induced constipation  History of left hydronephrosis status post stent placement She has an upcoming appointment with urology If still in the hospital, consider urology consult  New Right hydronephrosis Urology consulted to assist with the management Closely monitor urine output  Physical debility PT OT assessment Fall precautions  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 Luana Shu Wound care specialist consulted Continue local wound care   Critical care time: 65 minutes.    DVT prophylaxis: Xarelto  Code Status: Full code  Family Communication: Husband at bedside, who is agreeable with the management.  He is a physician retired from [employer]  Disposition Plan: Admitted to telemetry cardiac unit.  Consults called: Cardiology, general  surgery  Admission status: Inpatient status.  Patient will require at least 2 midnights for further evaluation and treatment of present condition.   Status is: Inpatient         Kayleen Memos MD [facility] Pager [phone]  If 7PM-7AM, please contact night-coverage [email or web address] Password [email or web address]  11/05/2021, 3:01 PM

## 2021-11-05 NOTE — ED Notes (Signed)
ED TO INPATIENT HANDOFF REPORT  ED Nurse Name and Phone #: Darnelle Maffucci [phone]  T Name/Age/Gender Tiffany [name] 87 y.o. female Room/Bed: ED26A/ED26A  Code Status   Code Status: Full Code  Home/SNF/Other Home Patient oriented to: self, place, time Is this baseline? Yes   Triage Complete: Triage complete  Chief Complaint Acute on chronic systolic CHF (congestive heart failure) ([place]) [I50.23]  Triage Note Pt comes into the ED via general surgeon who brought her over due to respiratory distress.  Pt was being seen for a hernia.  Pt able to have hernia reduced, but it comes back out due to the accessory muscle use with breathing.  Pt has h/o a-fib, left pleural effusion 2 weeks ago, and cardioversion yesterday.  Pt has exertional inhaler to use but has had no success with it today.  Pt presents labored at this time.  Pt denies any nausea, chest pain, or dizziness.     Allergies Allergies  Allergen Reactions   Amlodipine Swelling   Codeine Nausea And Vomiting   Tizanidine     Didn't feel good taking it   Doxycycline Nausea And Vomiting    Level of Care/Admitting Diagnosis ED Disposition     ED Disposition  Admit   Condition  --   Comment  Hospital Area: [place] [[id number]]  Level of Care: Telemetry Cardiac [[id number]]  Covid Evaluation: Confirmed COVID Negative  Diagnosis: Acute on chronic systolic CHF (congestive heart failure) [facility]) [[id number]]  Admitting Physician: Kayleen Memos [[id number]]  Attending Physician: Kayleen Memos [phone]  Estimated length of stay: past midnight tomorrow  Certification:: I certify this patient will need inpatient services for at least 2 midnights          B Medical/Surgery History Past Medical History:  Diagnosis Date   A-fib ([place])    Anemia    Arthritis    Cataracts, both eyes    Chickenpox    Chronic kidney disease    Complication of anesthesia    Diabetes mellitus without complication ([place])    Dyspnea     with exertion   Dysrhythmia    Hyperlipidemia    Hypertension    PONV (postoperative nausea and vomiting)    with 1st pregnancy 50 years ago and no problem since then   Right leg weakness    Spinal stenosis    UTI (urinary tract infection)    Past Surgical History:  Procedure Laterality Date   BACK SURGERY     x2 -cervical fusion and mid-lower back surgery   CARDIOVERSION     CARDIOVERSION N/A 11/04/2021   Procedure: CARDIOVERSION;  Surgeon: Teodoro Spray, MD;  Location: [facility] ORS;  Service: Cardiovascular;  Laterality: N/A;   CATARACT EXTRACTION, BILATERAL     CERVICAL [place]  12/06/2020   Procedure: CYSTOSCOPY WITH RETROGRADE PYELOGRAM;  Surgeon: Billey Co, MD;  Location: [facility] ORS;  Service: Urology;;   [place] Left 12/06/2020   Procedure: CYSTOSCOPY WITH STENT PLACEMENT;  Surgeon: Billey Co, MD;  Location: [facility] ORS;  Service: Urology;  Laterality: Left;   ELECTROPHYSIOLOGIC STUDY N/A 01/12/2017   Procedure: Cardioversion;  Surgeon: Teodoro Spray, MD;  Location: [facility] ORS;  Service: Cardiovascular;  Laterality: N/A;   EYE SURGERY     JOINT REPLACEMENT     SHOULDER SURGERY Right 2010   rotator cuff repair   TOTAL KNEE ARTHROPLASTY Left 05/23/2019   Procedure: TOTAL KNEE ARTHROPLASTY - LEFT -  DIABETIC;  Surgeon: Corky Mull, MD;  Location: [facility] ORS;  Service: Orthopedics;  Laterality: Left;   URETEROSCOPY N/A 12/06/2020   Procedure: DIAGNOSTIC URETEROSCOPY;  Surgeon: Billey Co, MD;  Location: [facility] ORS;  Service: Urology;  Laterality: N/A;     A IV Location/Drains/Wounds Patient Lines/Drains/Airways Status     Active Line/Drains/Airways     Name Placement date Placement time Site Days   Peripheral IV 11/05/21 20 G Anterior;Proximal;Right Forearm 11/05/21  1309  Forearm  less than 1   Ureteral Drain/Stent Left ureter 6 Fr. 12/06/20  0903  Left ureter  334   Incision (Closed) 05/23/19 Knee  Left 05/23/19  1055  -- 897   Incision (Closed) 12/06/20 Perineum 12/06/20  0850  -- 334            Intake/Output Last 24 hours No intake or output data in the 24 hours ending 11/05/21 1608  Labs/Imaging Results for orders placed or performed during the hospital encounter of 11/05/21 (from the past 48 hour(s))  CBC     Status: Abnormal   Collection Time: 11/05/21 12:49 PM  Result Value Ref Range   WBC 17.1 (H) 4.0 - 10.5 K/uL   RBC 4.41 3.87 - 5.11 MIL/uL   Hemoglobin 12.1 12.0 - 15.0 g/dL   HCT 38.7 36.0 - 46.0 %   MCV 87.8 80.0 - 100.0 fL   MCH 27.4 26.0 - 34.0 pg   MCHC 31.3 30.0 - 36.0 g/dL   RDW 14.3 11.5 - 15.5 %   Platelets 324 150 - 400 K/uL   nRBC 0.0 0.0 - 0.2 %    Comment: Performed at [facility], [place], [place] [address]  Troponin I (High Sensitivity)     Status: None   Collection Time: 11/05/21 12:49 PM  Result Value Ref Range   Troponin I (High Sensitivity) 14 <18 ng/L    Comment: (NOTE) Elevated high sensitivity troponin I (hsTnI) values and significant  changes across serial measurements may suggest ACS but many other  chronic and acute conditions are known to elevate hsTnI results.  Refer to the "Links" section for chest pain algorithms and additional  guidance. Performed at [facility], [address] [place], [place] [address]   Protime-INR (order if Patient is taking Coumadin / Warfarin)     Status: Abnormal   Collection Time: 11/05/21 12:49 PM  Result Value Ref Range   Prothrombin Time 33.2 (H) 11.4 - 15.2 seconds   INR 3.3 (H) 0.8 - 1.2    Comment: (NOTE) INR goal varies based on device and disease states. Performed at [facility], [address] [place], [place] [address]   Lactic acid, plasma     Status: Abnormal   Collection Time: 11/05/21 12:49 PM  Result Value Ref Range   Lactic Acid, Venous 2.2 (HH) 0.5 - 1.9 mmol/L    Comment: CRITICAL RESULT CALLED TO, READ BACK BY AND VERIFIED  WITH Lamin Chandley DEIOLD 11/05/21 1328 MW Performed at [facility], [address] [place], [place] [address]   Brain natriuretic peptide     Status: Abnormal   Collection Time: 11/05/21 12:49 PM  Result Value Ref Range   B Natriuretic Peptide 228.7 (H) 0.0 - 100.0 pg/mL    Comment: Performed at [facility], [address] [place],  [address]  Comprehensive metabolic panel     Status: Abnormal   Collection Time: 11/05/21 12:52 PM  Result Value Ref Range  Sodium 139 135 - 145 mmol/L   Potassium 4.4 3.5 - 5.1 mmol/L   Chloride 104 98 - 111 mmol/L   CO2 25 22 - 32 mmol/L   Glucose, Bld 137 (H) 70 - 99 mg/dL    Comment: Glucose reference range applies only to samples taken after fasting for at least 8 hours.   BUN 33 (H) 8 - 23 mg/dL   Creatinine, Ser 2.31 (H) 0.44 - 1.00 mg/dL   Calcium 8.9 8.9 - 10.3 mg/dL   Total Protein 7.2 6.5 - 8.1 g/dL   Albumin 3.7 3.5 - 5.0 g/dL   AST 14 (L) 15 - 41 U/L   ALT 10 0 - 44 U/L   Alkaline Phosphatase 82 38 - 126 U/L   Total Bilirubin 1.0 0.3 - 1.2 mg/dL   GFR, Estimated 20 (L) >60 mL/min    Comment: (NOTE) Calculated using the CKD-EPI Creatinine Equation (2021)    Anion gap 10 5 - 15    Comment: Performed at [facility], [address] [place], [place] [address]  Resp Panel by RT-PCR (Flu A&B, Covid) Nasopharyngeal Swab     Status: None   Collection Time: 11/05/21  1:05 PM   Specimen: Nasopharyngeal Swab; Nasopharyngeal(NP) swabs in vial transport medium  Result Value Ref Range   SARS Coronavirus 2 by RT PCR NEGATIVE NEGATIVE    Comment: (NOTE) SARS-CoV-2 target nucleic acids are NOT DETECTED.  The SARS-CoV-2 RNA is generally detectable in upper respiratory specimens during the acute phase of infection. The lowest concentration of SARS-CoV-2 viral copies this assay can detect is 138 copies/mL. A negative result does not preclude SARS-Cov-2 infection and should not be used as the sole basis for  treatment or other patient management decisions. A negative result may occur with  improper specimen collection/handling, submission of specimen other than nasopharyngeal swab, presence of viral mutation(s) within the areas targeted by this assay, and inadequate number of viral copies(<138 copies/mL). A negative result must be combined with clinical observations, patient history, and epidemiological information. The expected result is Negative.  Fact Sheet for Patients:  EntrepreneurPulse.com.au  Fact Sheet for Healthcare Providers:  IncredibleEmployment.be  This test is no t yet approved or cleared by the [place] FDA and  has been authorized for detection and/or diagnosis of SARS-CoV-2 by FDA under an Emergency Use Authorization (EUA). This EUA will remain  in effect (meaning this test can be used) for the duration of the COVID-19 declaration under Section 564(b)(1) of the Act, 21 U.S.C.section 360bbb-3(b)(1), unless the authorization is terminated  or revoked sooner.       Influenza A by PCR NEGATIVE NEGATIVE   Influenza B by PCR NEGATIVE NEGATIVE    Comment: (NOTE) The Xpert Xpress SARS-CoV-2/FLU/RSV plus assay is intended as an aid in the diagnosis of influenza from Nasopharyngeal swab specimens and should not be used as a sole basis for treatment. Nasal washings and aspirates are unacceptable for Xpert Xpress SARS-CoV-2/FLU/RSV testing.  Fact Sheet for Patients: EntrepreneurPulse.com.au  Fact Sheet for Healthcare Providers: IncredibleEmployment.be  This test is not yet approved or cleared by the [place] FDA and has been authorized for detection and/or diagnosis of SARS-CoV-2 by FDA under an Emergency Use Authorization (EUA). This EUA will remain in effect (meaning this test can be used) for the duration of the COVID-19 declaration under Section 564(b)(1) of the Act, 21 U.S.C. section  360bbb-3(b)(1), unless the authorization is terminated or revoked.  Performed at [facility], [address] [place], [place] [address]    CT ABDOMEN PELVIS WO CONTRAST  Result Date: 11/05/2021 CLINICAL DATA:  Abduct abdominal pain EXAM: CT ABDOMEN AND PELVIS WITHOUT CONTRAST TECHNIQUE: Multidetector CT imaging of the abdomen and pelvis was performed following the standard protocol without IV contrast. COMPARISON:  12/02/2020 FINDINGS: Lower chest: Small bilateral pleural effusions are identified with mild interlobular septal thickening concerning for CHF. Hepatobiliary: No focal liver abnormality is seen. No gallstones, gallbladder wall thickening, or biliary dilatation. Pancreas: Unremarkable. No pancreatic ductal dilatation or surrounding inflammatory changes. Spleen: Normal in size without focal abnormality. Adrenals/Urinary Tract: Normal appearance of the adrenal glands. Exophytic cyst arises off the posteromedial cortex of the upper pole of left kidney measuring 2.6 cm. Moderate bilateral hydronephrosis and hydroureter is identified. There is a left-sided nephroureteral stent in place with proximal pigtail distal to the UPJ, image 60/5. The distal pigtail is in the urinary bladder. No signs of obstructing stone. Diffuse bladder wall trabeculation is identified. Stomach/Bowel: Stomach is normal. No small bowel wall thickening, inflammation or distension. Large ventral midline hernia is identified which contains fat as well as a loop of transverse colon, image 30/2. Signs of partial obstruction are identified with transition to decreased caliber distal transverse colon, image 25/2 Vascular/Lymphatic: Aortic atherosclerosis. No abdominopelvic adenopathy. Reproductive: Uterus and bilateral adnexa are unremarkable. Other: No free fluid or fluid collections but. No signs of pneumoperitoneum. Musculoskeletal: Postop change within the lumbar spine from PLIF at L4-5. Marked degenerative disc  disease is noted at L1-2, L2-3 and L3-4. No acute osseous findings identified. IMPRESSION: 1. Large ventral midline hernia is identified which a partially obstructed loop of transverse colon. 2. Persistent hydronephrosis status post placement of nephroureteral stent. The proximal pigtail of the ureteral stent is within the ureter, distal to the UPJ. 3. New right hydronephrosis and hydroureter to the level of the urinary bladder. No obstructing stone or mass noted. 4. Distended urinary bladder with diffuse bladder wall thickening. Suspect chronic bladder outlet obstruction. 5. Small bilateral pleural effusions with mild interlobular septal thickening concerning for CHF. 6. Aortic Atherosclerosis (ICD10-I70.0). Electronically Signed   By: Kerby Moors M.D.   On: 11/05/2021 15:35   DG Chest Portable 1 View  Result Date: 11/05/2021 CLINICAL DATA:  Shortness of breath, respiratory distress dress, history of hernia, diabetes mellitus, hypertension, atrial fibrillation EXAM: PORTABLE CHEST 1 VIEW COMPARISON:  Portable exam 1258 hours compared to 04/19/2012 FINDINGS: Normal heart size, mediastinal contours, and pulmonary vascularity. Sir aorta Bronchitic changes with scattered interstitial infiltrates in both lungs, could represent pulmonary edema or atypical infection. Tiny LEFT pleural effusion. No pneumothorax. Scattered endplate spur formation thoracic spine. IMPRESSION: Bronchitic changes with diffuse interstitial infiltrates, question pulmonary edema versus atypical infection. Tiny LEFT pleural effusion. Electronically Signed   By: Lavonia Dana M.D.   On: 11/05/2021 13:07    Pending Labs Unresulted Labs (From admission, onward)     Start     Ordered   11/06/21 5366  Basic metabolic panel  Daily,   STAT      11/05/21 1501   11/06/21 0500  Procalcitonin  Tomorrow morning,   STAT        11/05/21 1523   11/06/21 0500  CBC with Differential/Platelet  Tomorrow morning,   STAT        11/05/21 1523   11/05/21  1252  Lactic acid, plasma  Now then every 2 hours,   STAT      11/05/21 1252  Vitals/Pain Today's Vitals   11/05/21 1400 11/05/21 1430 11/05/21 1457 11/05/21 1604  BP: (!) 162/95 (!) 173/97  (!) 165/98  Pulse: (!) 107 97 (!) 101 (!) 102  Resp: (!) 39 (!) 34 (!) 30 (!) 26  Temp:   98.2 F (36.8 C) 97.9 F (36.6 C)  TempSrc:   Oral Oral  SpO2: 97% 100% 97% 97%  Weight:      Height:      PainSc:   6  7     Isolation Precautions No active isolations  Medications Medications  acetaminophen (TYLENOL) tablet 500 mg (has no administration in time range)  HYDROcodone-acetaminophen (NORCO/VICODIN) 5-325 MG per tablet 0.5 tablet (0.5 tablets Oral Given 11/05/21 1603)  ipratropium-albuterol (DUONEB) 0.5-2.5 (3) MG/3ML nebulizer solution 3 mL (3 mLs Nebulization Given 11/05/21 1259)  ipratropium-albuterol (DUONEB) 0.5-2.5 (3) MG/3ML nebulizer solution 3 mL (3 mLs Nebulization Given 11/05/21 1303)  furosemide (LASIX) injection 60 mg (60 mg Intravenous Given 11/05/21 1338)    Mobility walks with person assist, uses a walker at home in the house and if she goes out she uses a wheel chair Moderate fall risk      R Recommendations: See Admitting Provider Note  Report given to:   Additional Notes: Pt has elevated RR, satting well on 2L via [place]

## 2021-11-05 NOTE — ED Triage Notes (Signed)
Pt comes into the ED via general surgeon who brought her over due to respiratory distress.  Pt was being seen for a hernia.  Pt able to have hernia reduced, but it comes back out due to the accessory muscle use with breathing.  Pt has h/o a-fib, left pleural effusion 2 weeks ago, and cardioversion yesterday.  Pt has exertional inhaler to use but has had no success with it today.  Pt presents labored at this time.  Pt denies any nausea, chest pain, or dizziness.

## 2021-11-05 NOTE — ED Notes (Signed)
Pt in bed, sig other at bedside, pt has slight decrease in RR, pt talking in short sentences, pt states that she is breathing better, med given for back pain.

## 2021-11-05 NOTE — ED Notes (Signed)
Date and time results received: 11/05/21 1334 (use smartphrase ".now" to insert current time)  Test: Lactate Critical Value: 2.2  Name of Provider Notified: Paduchowski

## 2021-11-05 NOTE — ED Provider Notes (Signed)
[facility] Emergency Department Provider Note  Time seen: 1:19 PM  I have reviewed the triage vital signs and the nursing notes.   HISTORY  Chief Complaint Dyspnea, abdominal pain  HPI Tiffany [name] is a 87 y.o. female with a past medical history of atrial fibrillation, CHF, diabetes, hypertension, hyperlipidemia, recent atrial fibrillation cardioversion yesterday, presents to the emergency department for worsening shortness of breath and abdominal pain.  Patient was being seen by Dr. Lysle Pearl of surgery today for a ventral hernia that appeared to be more distended than normal.  However Dr. Lysle Pearl after seeing the patient referred her to the emergency department given her shortness of breath.  Patient's main complaint is feeling short of breath and having difficulty breathing.  Denies any chest pain but does state pain in her abdomen as well as an increased distention to her ventral hernia.  Denies any fever or cough.  Patient does have lower extreme edema but states she has CHF.  Husband is a retired Engineer, drilling and is also a great historian for the patient.   Past Medical History:  Diagnosis Date   A-fib ([place])    Anemia    Arthritis    Cataracts, both eyes    Chickenpox    Chronic kidney disease    Complication of anesthesia    Diabetes mellitus without complication ([place])    Dyspnea    with exertion   Dysrhythmia    Hyperlipidemia    Hypertension    PONV (postoperative nausea and vomiting)    with 1st pregnancy 50 years ago and no problem since then   Right leg weakness    Spinal stenosis    UTI (urinary tract infection)     Patient Active Problem List   Diagnosis Date Noted   Status post total knee replacement using cement, left 05/23/2019   Chronic pain of left knee 02/08/2019   Primary osteoarthritis of left knee 02/08/2019   Derangement of lateral meniscus, left 02/08/2019   Chronic pain syndrome 02/08/2019    Past Surgical History:  Procedure  Laterality Date   BACK SURGERY     x2 -cervical fusion and mid-lower back surgery   CARDIOVERSION     CARDIOVERSION N/A 11/04/2021   Procedure: CARDIOVERSION;  Surgeon: Teodoro Spray, MD;  Location: [facility] ORS;  Service: Cardiovascular;  Laterality: N/A;   CATARACT EXTRACTION, BILATERAL     CERVICAL LAMINECTOMY  1996   CYSTOSCOPY W/ RETROGRADES  12/06/2020   Procedure: CYSTOSCOPY WITH RETROGRADE PYELOGRAM;  Surgeon: Billey Co, MD;  Location: [facility] ORS;  Service: Urology;;   CYSTOSCOPY WITH STENT PLACEMENT Left 12/06/2020   Procedure: CYSTOSCOPY WITH STENT PLACEMENT;  Surgeon: Billey Co, MD;  Location: [facility] ORS;  Service: Urology;  Laterality: Left;   ELECTROPHYSIOLOGIC STUDY N/A 01/12/2017   Procedure: Cardioversion;  Surgeon: Teodoro Spray, MD;  Location: [facility] ORS;  Service: Cardiovascular;  Laterality: N/A;   EYE SURGERY     JOINT REPLACEMENT     SHOULDER SURGERY Right 2010   rotator cuff repair   TOTAL KNEE ARTHROPLASTY Left 05/23/2019   Procedure: TOTAL KNEE ARTHROPLASTY - LEFT - DIABETIC;  Surgeon: Corky Mull, MD;  Location: [facility] ORS;  Service: Orthopedics;  Laterality: Left;   URETEROSCOPY N/A 12/06/2020   Procedure: DIAGNOSTIC URETEROSCOPY;  Surgeon: Billey Co, MD;  Location: [facility] OR[facility];  Service: Urology;  Laterality: N/A;    Prior to Admission medications   Medication Sig Start Date End Date Taking? Authorizing Provider  acetaminophen (TYLENOL) 500 MG tablet Take 500 mg by mouth every 6 (six) hours as needed (pain).     [provider]  amiodarone (PACERONE) 200 MG tablet Take 200 mg by mouth 2 (two) times daily.    [provider]  glimepiride (AMARYL) 2 MG tablet Take 2 mg by mouth daily with breakfast.    [provider]  glucose blood (PRECISION QID TEST) test strip Use 2 (two) times daily Use as instructed. 05/03/20   [provider]  HYDROcodone-acetaminophen (NORCO/VICODIN) 5-325 MG tablet Take 0.5 tablets by mouth 2  (two) times daily as needed for moderate pain.    [provider]  insulin glargine, 1 Unit Dial, (TOUJEO SOLOSTAR) 300 UNIT/ML Solostar Pen Inject 11 Units into the skin daily. 10/24/20   [provider]  Insulin Pen Needle (BD PEN NEEDLE NANO U/F) 32G X 4 MM MISC USE WITH INSULIN PEN TWICE DAILY OR AS DIRECTED 10/18/19   [provider]  metoprolol succinate (TOPROL-XL) 25 MG 24 hr tablet Take 25 mg by mouth daily.    [provider]  NOVOLOG FLEXPEN 100 UNIT/ML FlexPen Inject 3-7 Units into the skin daily before supper. 08/30/20   [provider]  polyethylene glycol (MIRALAX / GLYCOLAX) 17 g packet Take 17 g by mouth daily as needed for moderate constipation.    [provider]  rivaroxaban (XARELTO) 20 MG TABS tablet Take 20 mg by mouth daily with breakfast.    [provider]  torsemide (DEMADEX) 20 MG tablet Take 10 mg by mouth daily as needed (swelling). 01/04/21   [provider]    Allergies  Allergen Reactions   Amlodipine Swelling   Codeine Nausea And Vomiting   Tizanidine     Didn't feel good taking it   Doxycycline Nausea And Vomiting    Family History  Adopted: Yes    Social History Social History   Tobacco Use   Smoking status: Never   Smokeless tobacco: Never  Vaping Use   Vaping Use: Never used  Substance Use Topics   Alcohol use: Never   Drug use: Never    Review of Systems Constitutional: Negative for fever. Cardiovascular: Negative for chest pain. Respiratory: Worsening shortness of breath Gastrointestinal: Positive for abdominal pain, ventral hernia.  Negative for diarrhea or vomiting Genitourinary: Negative for urinary compaints Musculoskeletal: Lower extremity edema, chronic Neurological: Negative for headache All other ROS negative  ____________________________________________   PHYSICAL EXAM:  VITAL SIGNS: ED Triage Vitals  Enc Vitals Group     BP 11/05/21 1242 (!)  147/137     Pulse Rate 11/05/21 1242 100     Resp 11/05/21 1242 (!) 46     Temp 11/05/21 1242 99.3 F (37.4 C)     Temp Source 11/05/21 1242 Oral     SpO2 11/05/21 1242 94 %     Weight 11/05/21 1242 204 lb 0.3 oz (92.5 kg)     Height 11/05/21 1242 5\' 4"  (1.626 m)     Head Circumference --      Peak Flow --      Pain Score 11/05/21 1241 7     Pain Loc --      Pain Edu? --      Excl. in [place]? --    Constitutional: Awake alert, quite tachypneic mild respiratory distress  Eyes: Normal exam ENT      Head: Normocephalic and atraumatic.      Mouth/Throat: Mucous membranes are moist. Cardiovascular: Normal rate,  regular rhythm around 100 bpm Respiratory: States moderate tachypnea with increased respiratory effort, mild expiratory wheeze but no rales or rhonchi Gastrointestinal: Patient has an obvious ventral hernia that is mildly tender to palpation, is somewhat soft on palpation but unable to reduce.  No skin color changes. Musculoskeletal: Nontender, right greater than left lower extremity edema, chronic Neurologic:  Normal speech and language. No gross focal neurologic deficits  Skin:  Skin is warm, dry and intact.  Psychiatric: Mood and affect are normal.   ____________________________________________    EKG  EKG viewed and interpreted by myself shows sinus tachycardia at 105 bpm with a widened QRS, normal axis, largely normal intervals with nonspecific ST changes.  Morphology most consistent with left bundle branch block, does not appear to be in atrial fibrillation.  ____________________________________________    RADIOLOGY  Chest x-ray shows bronchitic changes with interstitial infiltrates likely edema versus atypical infection  ____________________________________________   INITIAL IMPRESSION / ASSESSMENT AND PLAN / ED COURSE  Pertinent labs & imaging results that were available during my care of the patient were reviewed by me and considered in my medical decision  making (see chart for details).   Patient presents to the emergency department for worsening shortness of breath, moderate tachypnea slight expiratory wheeze.  We will dose duo nebs while awaiting lab and x-ray results.  Patient denies any chest pain.  She does have a 99.3 temperature in the emergency department.  Differential would include CHF exacerbation, pleural effusion, pneumonia or other infectious etiology.  Patient also has a distended ventral hernia, surgery would like a CT scan to rule out incarceration/strangulation although does not appear to be strangulated based on physical exam.  Patient's work-up today shows x-ray changes consistent with interstitial edema.  COVID/flu is negative.  I dosed the patient 60 mg of IV Lasix she is beginning to diurese well.  Troponin is negative.  Patient did desat to 90% on room air, placed on 2 L where she is satting in the upper 90s.  Patient does have a leukocytosis of 17,000 as the patient is feeling better we will attempt to obtain a CT scan of the abdomen to further evaluate, as long as the patient is able to lie flat and tolerate.  We will admit to the hospital service for further diuresis for CHF exacerbation.  Sarea Fyfe was evaluated in Emergency Department on 11/05/2021 for the symptoms described in the history of present illness. She was evaluated in the context of the global COVID-19 pandemic, which necessitated consideration that the patient might be at risk for infection with the SARS-CoV-2 virus that causes COVID-19. Institutional protocols and algorithms that pertain to the evaluation of patients at risk for COVID-19 are in a state of rapid change based on information released by regulatory bodies including the CDC and federal and state organizations. These policies and algorithms were followed during the patient's care in the ED.  ____________________________________________   FINAL CLINICAL IMPRESSION(S) / ED DIAGNOSES  Abdominal  pain Hernia Dyspnea CHF exacerbation   Harvest Dark, MD 11/05/21 1458

## 2021-11-06 ENCOUNTER — Telehealth: Payer: Self-pay | Admitting: Urology

## 2021-11-06 ENCOUNTER — Inpatient Hospital Stay
Admit: 2021-11-06 | Discharge: 2021-11-06 | Disposition: A | Discharge status: Home / Self Care | Payer: PPO | Attending: Internal Medicine | Admitting: Internal Medicine

## 2021-11-06 ENCOUNTER — Other Ambulatory Visit: Payer: Self-pay | Admitting: Urology

## 2021-11-06 DIAGNOSIS — M109 Gout, unspecified: Secondary | ICD-10-CM

## 2021-11-06 DIAGNOSIS — R310 Gross hematuria: Secondary | ICD-10-CM | POA: Diagnosis not present

## 2021-11-06 DIAGNOSIS — R339 Retention of urine, unspecified: Secondary | ICD-10-CM

## 2021-11-06 DIAGNOSIS — I509 Heart failure, unspecified: Secondary | ICD-10-CM

## 2021-11-06 DIAGNOSIS — I482 Chronic atrial fibrillation, unspecified: Secondary | ICD-10-CM

## 2021-11-06 DIAGNOSIS — N1339 Other hydronephrosis: Secondary | ICD-10-CM

## 2021-11-06 DIAGNOSIS — K439 Ventral hernia without obstruction or gangrene: Secondary | ICD-10-CM

## 2021-11-06 DIAGNOSIS — N131 Hydronephrosis with ureteral stricture, not elsewhere classified: Secondary | ICD-10-CM

## 2021-11-06 DIAGNOSIS — J189 Pneumonia, unspecified organism: Secondary | ICD-10-CM

## 2021-11-06 LAB — ECHOCARDIOGRAM COMPLETE
AR max vel: 1.74 cm2
AV Area VTI: 1.69 cm2
AV Area mean vel: 1.56 cm2
AV Mean grad: 4 mmHg
AV Peak grad: 6.9 mmHg
Ao pk vel: 1.31 m/s
Area-P 1/2: 3.79 cm2
Calc EF: 42 %
Height: 63 in
MV VTI: 1.76 cm2
S' Lateral: 3.3 cm
Single Plane A2C EF: 40.9 %
Single Plane A4C EF: 47.3 %
Weight: 3178.15 oz

## 2021-11-06 LAB — BASIC METABOLIC PANEL
Anion gap: 6 (ref 5–15)
BUN: 36 mg/dL — ABNORMAL HIGH (ref 8–23)
CO2: 26 mmol/L (ref 22–32)
Calcium: 8.1 mg/dL — ABNORMAL LOW (ref 8.9–10.3)
Chloride: 104 mmol/L (ref 98–111)
Creatinine, Ser: 2.34 mg/dL — ABNORMAL HIGH (ref 0.44–1.00)
GFR, Estimated: 20 mL/min — ABNORMAL LOW (ref >60)
Glucose, Bld: 149 mg/dL — ABNORMAL HIGH (ref 70–99)
Potassium: 3.5 mmol/L (ref 3.5–5.1)
Sodium: 136 mmol/L (ref 135–145)

## 2021-11-06 LAB — TROPONIN I (HIGH SENSITIVITY): Troponin I (High Sensitivity): 215 ng/L (ref <18)

## 2021-11-06 LAB — CBC WITH DIFFERENTIAL/PLATELET
Abs Immature Granulocytes: 0.06 10*3/uL (ref 0.00–0.07)
Basophils Absolute: 0.1 10*3/uL (ref 0.0–0.1)
Basophils Relative: 1 %
Eosinophils Absolute: 0.1 10*3/uL (ref 0.0–0.5)
Eosinophils Relative: 1 %
HCT: 31 % — ABNORMAL LOW (ref 36.0–46.0)
Hemoglobin: 9.9 g/dL — ABNORMAL LOW (ref 12.0–15.0)
Immature Granulocytes: 1 %
Lymphocytes Relative: 15 %
Lymphs Abs: 1.8 10*3/uL (ref 0.7–4.0)
MCH: 27.6 pg (ref 26.0–34.0)
MCHC: 31.9 g/dL (ref 30.0–36.0)
MCV: 86.4 fL (ref 80.0–100.0)
Monocytes Absolute: 1 10*3/uL (ref 0.1–1.0)
Monocytes Relative: 8 %
Neutro Abs: 9.2 10*3/uL — ABNORMAL HIGH (ref 1.7–7.7)
Neutrophils Relative %: 74 %
Platelets: 229 10*3/uL (ref 150–400)
RBC: 3.59 MIL/uL — ABNORMAL LOW (ref 3.87–5.11)
RDW: 14.2 % (ref 11.5–15.5)
WBC: 12.2 10*3/uL — ABNORMAL HIGH (ref 4.0–10.5)
nRBC: 0 % (ref 0.0–0.2)

## 2021-11-06 LAB — LIPID PANEL
Cholesterol: 127 mg/dL (ref 0–200)
HDL: 45 mg/dL (ref >40)
LDL Cholesterol: 67 mg/dL (ref 0–99)
Total CHOL/HDL Ratio: 2.8 RATIO
Triglycerides: 74 mg/dL (ref <150)
VLDL: 15 mg/dL (ref 0–40)

## 2021-11-06 LAB — PHOSPHORUS: Phosphorus: 4.2 mg/dL (ref 2.5–4.6)

## 2021-11-06 LAB — GLUCOSE, CAPILLARY
Glucose-Capillary: 93 mg/dL (ref 70–99)
Glucose-Capillary: 112 mg/dL — ABNORMAL HIGH (ref 70–99)
Glucose-Capillary: 79 mg/dL (ref 70–99)
Glucose-Capillary: 87 mg/dL (ref 70–99)

## 2021-11-06 LAB — PROTIME-INR
INR: 2.6 — ABNORMAL HIGH (ref 0.8–1.2)
Prothrombin Time: 28 seconds — ABNORMAL HIGH (ref 11.4–15.2)

## 2021-11-06 LAB — MAGNESIUM: Magnesium: 2.1 mg/dL (ref 1.7–2.4)

## 2021-11-06 LAB — PROCALCITONIN: Procalcitonin: 0.84 ng/mL

## 2021-11-06 MED ORDER — POLYETHYLENE GLYCOL 3350 17 G PO PACK
17.0000 g | PACK | Freq: Two times a day (BID) | ORAL | Status: DC
  Administered 2021-11-06: 17 g via ORAL
  Filled 2021-11-06 (×2): qty 1

## 2021-11-06 MED ORDER — PERFLUTREN LIPID MICROSPHERE
1.0000 mL | INTRAVENOUS | Status: AC | PRN
Start: 2021-11-06 — End: 2021-11-06
  Administered 2021-11-06: 2 mL via INTRAVENOUS
  Filled 2021-11-06: qty 10

## 2021-11-06 MED ORDER — CHLORHEXIDINE GLUCONATE CLOTH 2 % EX PADS
6.0000 | MEDICATED_PAD | Freq: Every day | CUTANEOUS | Status: DC
  Administered 2021-11-06 – 2021-11-07 (×2): 6 via TOPICAL

## 2021-11-06 MED ORDER — SALINE SPRAY 0.65 % NA SOLN
1.0000 | NASAL | Status: DC | PRN
  Filled 2021-11-06: qty 44

## 2021-11-06 MED ORDER — SODIUM CHLORIDE 0.9 % IV SOLN
2.0000 g | Freq: Once | INTRAVENOUS | Status: AC
  Administered 2021-11-06: 2 g via INTRAVENOUS
  Filled 2021-11-06: qty 20

## 2021-11-06 MED ORDER — BACITRACIN-NEOMYCIN-POLYMYXIN OINTMENT TUBE
TOPICAL_OINTMENT | Freq: Two times a day (BID) | CUTANEOUS | Status: DC
  Administered 2021-11-06 – 2021-11-07 (×2): 1 via TOPICAL
  Filled 2021-11-06: qty 14.17

## 2021-11-06 MED ORDER — SODIUM CHLORIDE 0.9 % IV SOLN
500.0000 mg | INTRAVENOUS | Status: DC
  Administered 2021-11-06: 500 mg via INTRAVENOUS
  Filled 2021-11-06: qty 500

## 2021-11-06 NOTE — Progress Notes (Signed)
[facility] Cross Coverage Note  Received secure chat from nursing staff regarding critical troponin of 168   Reviewed labs and HP and nursing notes.  Troponin HS is 168 (from 116).  RN endorsed that patient is currently sleeping and before sleeping, [name] did not endorse having chest pain  Assessment/Plan  Elevated troponin-etiology work up in progress, I presume this is secondary to demand ischemia secondary to afib with rvr vs CAP - check troponin HS with 5 am labs - AM team to follow-up on HS Trop - Azithromycin 500 mg IV ordered for atypical coverage and ceftriaxone IV  - discussed with RN  Dr. Tobie [name]

## 2021-11-06 NOTE — Consult Note (Signed)
[facility] Nurse Consult Note: Patient receiving care in [facility]. Reason for Consult: left second toe ulceration with gouty tophi Wound type: as indicated in order Pressure Injury POA: Yes/No/NA Measurement: roughly 1.2 cm x 1.2 cm x 0.2 cm and encompasses the entire PIPJ area.  The toe is grossly enlarged Wound bed: gouty tophi in wound bed Drainage (amount, consistency, odor) none Periwound: intact with visible gouty tophi seen subdermally. Dressing procedure/placement/frequency: Twice daily application of neosporin and telfa. Monitor the wound area(s) for worsening of condition such as: Signs/symptoms of infection,  Increase in size,  Development of or worsening of odor, Development of pain, or increased pain at the affected locations.  Notify the medical team if any of these develop.  Thank you for the consult.  Discussed plan of care with the patient.  [place] nurse will not follow at this time.  Please re-consult the [facility] team if needed.  Val Riles, RN, MSN, CWOCN, CNS-BC, pager [phone]

## 2021-11-06 NOTE — Consult Note (Signed)
Urology Consult   I have been asked to see the patient by Dr. Nevada Crane, for evaluation and management of distended bladder and bilateral hydroureteronephrosis, and indwelling left ureteral stent.  Chief Complaint: Bilateral hydronephrosis  HPI:  Tiffany [name] is a 87 y.o. year old female admitted for shortness of breath and CHF exacerbation, as well as rising troponins. CT performed for ventral hernia to evaluate for incarceration showed bilateral hydroureteronephrosis down to a distended bladder, left ureteral stent in place. In terms of renal function, eGFR down slightly this AM at 20 from a baseline of 25 with the left ureteral stent in place.  She has interesting urologic history. She was referred to me in December 2021 for asymptomatic new left hydronephrosis down to the bladder on CT with no stone or mass, and worsening renal function. Diagnostic ureteroscopy showed no mass, stone, or lesion, and no evidence of obstruction in the left ureter. Suspect this may be related to reflux from incomplete emptying vs bladder wall thickening, but etiology unclear. Her renal function improved after left ureteral stent placement, and we had planned for 6 month follow up and yearly stent changes, but she did not follow up.  Her husband provides most of the history this afternoon.  She really denies any significant urinary symptoms aside from some feeling of difficulty voiding or incomplete emptying.  Denies any significant incontinence that she is aware of.  Denies gross hematuria or flank pain.    PMH: Past Medical History:  Diagnosis Date   A-fib ([place])    Anemia    Arthritis    Cataracts, both eyes    Chickenpox    Chronic kidney disease    Complication of anesthesia    Diabetes mellitus without complication ([place])    Dyspnea    with exertion   Dysrhythmia    Hyperlipidemia    Hypertension    PONV (postoperative nausea and vomiting)    with 1st pregnancy 50 years ago and no problem  since then   Right leg weakness    Spinal stenosis    UTI (urinary tract infection)     Surgical History: Past Surgical History:  Procedure Laterality Date   BACK SURGERY     x2 -cervical fusion and mid-lower back surgery   CARDIOVERSION     CARDIOVERSION N/A 11/04/2021   Procedure: CARDIOVERSION;  Surgeon: Teodoro Spray, MD;  Location: [facility] OR[facility];  Service: Cardiovascular;  Laterality: N/A;   CATARACT EXTRACTION, BILATERAL     CERVICAL LAMINECTOMY  1996   CYSTOSCOPY W/ RETROGRADES  12/06/2020   Procedure: CYSTOSCOPY WITH RETROGRADE PYELOGRAM;  Surgeon: Billey Co, MD;  Location: [facility];  Service: Urology;;   [place] Left 12/06/2020   Procedure: CYSTOSCOPY WITH STENT PLACEMENT;  Surgeon: Billey Co, MD;  Location: [facility];  Service: Urology;  Laterality: Left;   ELECTROPHYSIOLOGIC STUDY N/A 01/12/2017   Procedure: Cardioversion;  Surgeon: Teodoro Spray, MD;  Location: [facility];  Service: Cardiovascular;  Laterality: N/A;   EYE SURGERY     JOINT REPLACEMENT     SHOULDER SURGERY Right 2010   rotator cuff repair   TOTAL KNEE ARTHROPLASTY Left 05/23/2019   Procedure: TOTAL KNEE ARTHROPLASTY - LEFT - DIABETIC;  Surgeon: Corky Mull, MD;  Location: [facility] OR[facility];  Service: Orthopedics;  Laterality: Left;   URETEROSCOPY N/A 12/06/2020   Procedure: DIAGNOSTIC URETEROSCOPY;  Surgeon: Billey Co, MD;  Location: [facility];  Service: Urology;  Laterality: N/A;  Allergies:  Allergies  Allergen Reactions   Amlodipine Swelling   Codeine Nausea And Vomiting   Tizanidine     Didn't feel good taking it   Doxycycline Nausea And Vomiting    Family History: Family History  Adopted: Yes    Social History:  reports that she has never smoked. She has never used smokeless tobacco. She reports that she does not drink alcohol and does not use drugs.  ROS: Negative aside from those stated in the HPI.  Physical Exam: BP 117/68 (BP Location: Left  Wrist)   Pulse 72   Temp 98.2 F (36.8 C)   Resp 20   Ht '5\' 3"'  (1.6 m)   Wt 90.1 kg   SpO2 100%   BMI 35.19 kg/m    Constitutional:  Alert and oriented, No acute distress. Cardiovascular: No clubbing, cyanosis, or edema. Respiratory: Normal respiratory effort, no increased work of breathing. GI: Abdomen is soft, nontender, nondistended, no abdominal masses GU: No flank tenderness Lymph: No cervical or inguinal lymphadenopathy. Skin: No rashes, bruises or suspicious lesions. Neurologic: Grossly intact, no focal deficits, moving all 4 extremities. Psychiatric: Normal mood and affect. Drains: Foley with clear yellow urine  Laboratory Data: Reviewed in Epic, see HPI  Pertinent Imaging: I have personally reviewed the CT showing distended bladder with bilateral hydroureteronephrosis down to the bladder.  Assessment & Plan:   Co-morbid and frail 87 yo F admitted with SOB secondary to CHF exacerbation, rising troponin, and new bilateral hydroureteronephrosis down to the bladder likely secondary to incomplete bladder emptying vs end stage thickened bladder. Prior cystoscopy and left diagnostic ureteroscopy in December 2021 with no evidence of mass/tumor, or obstruction.  Recommendations: -Maintain foley, plan voiding trial in clinic in 1 week(renal ultrasound and BMP prior to evaluate hydronephrosis change with Foley catheter in place), may need to consider long term foley vs SP tube drainage -Could consider outpatient urodynamics in the future for better evaluation of bladder and reflux, but may be challenging with her frailty and co-morbidities   Billey Co, MD  Total time spent on the floor was 80 minutes, with greater than 50% spent in counseling and coordination of care with the patient regarding bilateral hydroureteronephrosis, distended bladder, CKD, and importance of Foley catheter drainage with repeat ultrasound.  [facility] [address],  [place], [place] [address] [phone]

## 2021-11-06 NOTE — Progress Notes (Signed)
OT Cancellation Note  Patient Details Name: Tiffany [name] MRN: [id number] DOB: 06-09-1933   Cancelled Treatment:    Reason Eval/Treat Not Completed: Patient not medically ready. Order received and chart reviewed. Pt noted to have elevated and upward trending troponin. Per MD ok to hold this date, plan to initiate services next date as appropriate.   Dessie Coma, M.S. OTR/L  11/06/21, 4:07 PM  ascom [phone]

## 2021-11-06 NOTE — Consult Note (Signed)
Subjective:   CC: ventral hernia  HPI:  Tiffany [name] is a 87 y.o. female who was referred by [facility] for evaluation of above cc.   Symptoms were first noted several hours ago. Pain is sharp, localized over hernia.  Associated with one episode of N/V, exacerbated by nothing specific  Lump is not reducible.   Hx of longstanding ventral hernia that is easily reducible, today, noticed it was not able to be reduced.  Unsure if the SOB and pain started before or after noticed hernia to not be reducible.    Past Medical History:  has a past medical history of A-fib ([place]), Anemia, Arthritis, Cataracts, both eyes, Chickenpox, Chronic kidney disease, Complication of anesthesia, Diabetes mellitus without complication ([place]), Dyspnea, Dysrhythmia, Hyperlipidemia, Hypertension, PONV (postoperative nausea and vomiting), Right leg weakness, Spinal stenosis, and UTI (urinary tract infection).  Past Surgical History:  Past Surgical History:  Procedure Laterality Date   BACK SURGERY     x2 -cervical fusion and mid-lower back surgery   CARDIOVERSION     CARDIOVERSION N/A 11/04/2021   Procedure: CARDIOVERSION;  Surgeon: Teodoro Spray, MD;  Location: [facility];  Service: Cardiovascular;  Laterality: N/A;   CATARACT EXTRACTION, BILATERAL     CERVICAL LAMINECTOMY  1996   CYSTOSCOPY W/ RETROGRADES  12/06/2020   Procedure: CYSTOSCOPY WITH RETROGRADE PYELOGRAM;  Surgeon: Billey Co, MD;  Location: [facility];  Service: Urology;;   CYSTOSCOPY WITH STENT PLACEMENT Left 12/06/2020   Procedure: CYSTOSCOPY WITH STENT PLACEMENT;  Surgeon: Billey Co, MD;  Location: [facility];  Service: Urology;  Laterality: Left;   ELECTROPHYSIOLOGIC STUDY N/A 01/12/2017   Procedure: Cardioversion;  Surgeon: Teodoro Spray, MD;  Location: [facility];  Service: Cardiovascular;  Laterality: N/A;   EYE SURGERY     JOINT REPLACEMENT     SHOULDER SURGERY Right 2010   rotator cuff repair   TOTAL KNEE ARTHROPLASTY Left 05/23/2019    Procedure: TOTAL KNEE ARTHROPLASTY - LEFT - DIABETIC;  Surgeon: Corky Mull, MD;  Location: [facility];  Service: Orthopedics;  Laterality: Left;   URETEROSCOPY N/A 12/06/2020   Procedure: DIAGNOSTIC URETEROSCOPY;  Surgeon: Billey Co, MD;  Location: [facility];  Service: Urology;  Laterality: N/A;    Family History: family history is not on file. She was adopted.  Social History:  reports that she has never smoked. She has never used smokeless tobacco. She reports that she does not drink alcohol and does not use drugs.  Current Medications:  Prior to Admission medications   Medication Sig Start Date End Date Taking? Authorizing Provider  acetaminophen (TYLENOL) 500 MG tablet Take 500 mg by mouth every 6 (six) hours as needed (pain).     [provider]  amiodarone (PACERONE) 200 MG tablet Take 200 mg by mouth 2 (two) times daily.    [provider]  glimepiride (AMARYL) 2 MG tablet Take 2 mg by mouth daily with breakfast.    [provider]  glucose blood (PRECISION QID TEST) test strip Use 2 (two) times daily Use as instructed. 05/03/20   [provider]  HYDROcodone-acetaminophen (NORCO/VICODIN) 5-325 MG tablet Take 0.5 tablets by mouth 2 (two) times daily as needed for moderate pain.    [provider]  insulin glargine, 1 Unit Dial, (TOUJEO SOLOSTAR) 300 UNIT/ML Solostar Pen Inject 11 Units into the skin daily. 10/24/20   [provider]  Insulin Pen Needle (BD PEN NEEDLE NANO U/F) 32G X 4 MM MISC USE WITH  INSULIN PEN TWICE DAILY OR AS DIRECTED 10/18/19   [provider]  metoprolol succinate (TOPROL-XL) 25 MG 24 hr tablet Take 25 mg by mouth daily.    [provider]  NOVOLOG FLEXPEN 100 UNIT/ML FlexPen Inject 3-7 Units into the skin daily before supper. 08/30/20   [provider]  polyethylene glycol (MIRALAX / GLYCOLAX) 17 g packet Take 17 g by mouth daily as needed for moderate constipation.    [provider]  rivaroxaban (XARELTO) 20 MG TABS tablet Take 20 mg by mouth daily with breakfast.    [provider]  torsemide (DEMADEX) 20 MG tablet Take 10 mg by mouth daily as needed (swelling). 01/04/21   [provider]    Allergies:  Allergies as of 11/05/2021 - Review Complete 11/05/2021  Allergen Reaction Noted   Amlodipine Swelling 05/07/2014   Codeine Nausea And Vomiting 05/07/2014   Tizanidine  10/14/2020   Doxycycline Nausea And Vomiting 02/16/2018    ROS:  General: Denies weight loss, weight gain, fatigue, fevers, chills, and night sweats. Eyes: Denies blurry vision, double vision, eye pain, itchy eyes, and tearing. Ears: Denies hearing loss, earache, and ringing in ears. Nose: Denies sinus pain, congestion, infections, runny nose, and nosebleeds. Mouth/throat: Denies hoarseness, sore throat, bleeding gums, and difficulty swallowing. Heart: Denies chest pain, palpitations, racing heart, irregular heartbeat, leg pain or swelling, and decreased activity tolerance. Respiratory: Denies cough, and sputum. GI: Denies change in appetite, heartburn, nausea, vomiting, constipation, diarrhea, and blood in stool. GU: Denies difficulty urinating, pain with urinating, urgency, frequency, blood in urine. Musculoskeletal: Denies joint stiffness, pain, muscle weakness. Skin: Denies rash, itching, mass, tumors, sores, and boils Neurologic: Denies headache, fainting, dizziness, seizures, numbness, and tingling. Psychiatric: Denies depression, anxiety, difficulty sleeping, and memory loss. Endocrine: Denies heat or cold intolerance, and increased thirst or urination. Blood/lymph: Denies easy bruising, easy bruising, and swollen glands     Objective:     BP 117/68 (BP Location: Left Wrist)   Pulse 72   Temp 98.2 F (36.8 C)   Resp 20   Ht 5\' 3"  (1.6 m)   Wt 90.1 kg   SpO2 100%   BMI 35.19 kg/m   Constitutional :  alert, cooperative, and mild distress   Lymphatics/Throat:  no asymmetry, masses, or scars  Respiratory:  Active wheezing with tachypnea, accessory muscle use  Cardiovascular:  Tachy rate, regular rhythm  Gastrointestinal: Soft, no guarding . ventral hernia noted.  Palpable bowel.  Musculoskeletal: lying in bed, no obvious difficulty moving upper extremities  Skin: Cool and moist  Psychiatric: Normal affect, non-agitated, not confused       LABS:  CMP Latest Ref Rng & Units 11/06/2021 11/05/2021 01/02/2021  Glucose 70 - 99 mg/dL 149(H) 137(H) 268(H)  BUN 8 - 23 mg/dL 36(H) 33(H) 43(H)  Creatinine 0.44 - 1.00 mg/dL 2.34(H) 2.31(H) 2.65(H)  Sodium 135 - 145 mmol/L 136 139 139  Potassium 3.5 - 5.1 mmol/L 3.5 4.4 5.0  Chloride 98 - 111 mmol/L 104 104 100  CO2 22 - 32 mmol/L 26 25 23   Calcium 8.9 - 10.3 mg/dL 8.1(L) 8.9 9.3  Total Protein 6.5 - 8.1 g/dL - 7.2 -  Total Bilirubin 0.3 - 1.2 mg/dL - 1.0 -  Alkaline Phos 38 - 126 U/L - 82 -  AST 15 - 41 U/L - 14(L) -  ALT 0 - 44 U/L - 10 -   CBC Latest Ref Rng & Units 11/06/2021 11/05/2021 01/02/2021  WBC 4.0 - 10.5  K/uL 12.2(H) 17.1(H) 10.0  Hemoglobin 12.0 - 15.0 g/dL 9.9(L) 12.1 13.8  Hematocrit 36.0 - 46.0 % 31.0(L) 38.7 41.8  Platelets 150 - 400 K/uL 229 324 313    RADS: CLINICAL DATA:  Abduct abdominal pain   EXAM: CT ABDOMEN AND PELVIS WITHOUT CONTRAST   TECHNIQUE: Multidetector CT imaging of the abdomen and pelvis was performed following the standard protocol without IV contrast.   COMPARISON:  12/02/2020   FINDINGS: Lower chest: Small bilateral pleural effusions are identified with mild interlobular septal thickening concerning for CHF.   Hepatobiliary: No focal liver abnormality is seen. No gallstones, gallbladder wall thickening, or biliary dilatation.   Pancreas: Unremarkable. No pancreatic ductal dilatation or surrounding inflammatory changes.   Spleen: Normal in size without focal abnormality.   Adrenals/Urinary Tract: Normal appearance of the  adrenal glands. Exophytic cyst arises off the posteromedial cortex of the upper pole of left kidney measuring 2.6 cm. Moderate bilateral hydronephrosis and hydroureter is identified. There is a left-sided nephroureteral stent in place with proximal pigtail distal to the UPJ, image 60/5. The distal pigtail is in the urinary bladder. No signs of obstructing stone. Diffuse bladder wall trabeculation is identified.   Stomach/Bowel: Stomach is normal. No small bowel wall thickening, inflammation or distension. Large ventral midline hernia is identified which contains fat as well as a loop of transverse colon, image 30/2. Signs of partial obstruction are identified with transition to decreased caliber distal transverse colon, image 25/2   Vascular/Lymphatic: Aortic atherosclerosis. No abdominopelvic adenopathy.   Reproductive: Uterus and bilateral adnexa are unremarkable.   Other: No free fluid or fluid collections but. No signs of pneumoperitoneum.   Musculoskeletal: Postop change within the lumbar spine from PLIF at L4-5. Marked degenerative disc disease is noted at L1-2, L2-3 and L3-4. No acute osseous findings identified.   IMPRESSION: 1. Large ventral midline hernia is identified which a partially obstructed loop of transverse colon. 2. Persistent hydronephrosis status post placement of nephroureteral stent. The proximal pigtail of the ureteral stent is within the ureter, distal to the UPJ. 3. New right hydronephrosis and hydroureter to the level of the urinary bladder. No obstructing stone or mass noted. 4. Distended urinary bladder with diffuse bladder wall thickening. Suspect chronic bladder outlet obstruction. 5. Small bilateral pleural effusions with mild interlobular septal thickening concerning for CHF. 6. Aortic Atherosclerosis (ICD10-I70.0).     Electronically Signed   By: Kerby Moors M.D.   On: 11/05/2021 15:35 Assessment:   Incarcerated ventral  hernia Respiratory distress Afib in RVR Plan:   Pt initally examined in clinic after notified by husband of hernia concerns.  When pt arrived to clinic, she was in acute respiratory distress, with accessory muscle use and tachypnea.  Quick bedside exam of hernia noted a minimally tender, ventral hernia with bowel, very soft and partially reducible.  I explained to husband that the hernia is not the cause of her acute respiratory distress and recommended immediate transfer to ED for further eval.    She was personally wheeled off the ED by myself and staff and admitted.  After initial resusciation and workup including CT, whose images were personally reviewed by myself and agree with report.  Repeat physical exam after CT done and patient respiratory status somewhat stabilized noted the ventral hernia to be containing loops of bowel as expected on palpation, but was able to completely reduce at bedside in between her abdominal breathing.  She denied any pain during reduction and the there was no overlying  skin changes.  The hernia does recur as soon as pressure is released from the area due to her body habitus and improved but still present increased work of breathing.  I recommended she continues to receive care for her other medical issues and not proceed with elective repair at this time, due to high risk of perioperative complications and well as her higher risk of recurrence.  Pt and husband verbalized understanding.  Surgery will continue to follow while inhouse to make sure hernia remains reducible. Further care for her other medical issues per hospitalist

## 2021-11-06 NOTE — Progress Notes (Signed)
Subjective:  CC: Tiffany [name] is a 87 y.o. female  Hospital stay day 1,   ventral hernia  HPI: No acute issues overnight.  Passing flatus, denies abdominal pain, N/V.  ROS:  General: Denies weight loss, weight gain, fatigue, fevers, chills, and night sweats. Heart: Denies chest pain, palpitations, racing heart, irregular heartbeat, leg pain or swelling, and decreased activity tolerance. Respiratory: Denies breathing difficulty, shortness of breath, wheezing, cough, and sputum. GI: Denies change in appetite, heartburn, nausea, vomiting, constipation, diarrhea, and blood in stool. GU: Denies difficulty urinating, pain with urinating, urgency, frequency, blood in urine.   Objective:   Temp:  [97.7 F (36.5 C)-99.3 F (37.4 C)] 98 F (36.7 C) (11/10 0922) Pulse Rate:  [72-107] 76 (11/10 0923) Resp:  [18-46] 20 (11/10 0435) BP: (117-173)/(62-137) 126/68 (11/10 0923) SpO2:  [94 %-100 %] 98 % (11/10 0923) Weight:  [90.1 kg-94 kg] 90.1 kg (11/10 0500)     Height: 5\' 3"  (160 cm) Weight: 90.1 kg BMI (Calculated): 35.2   Intake/Output this shift:   Intake/Output Summary (Last 24 hours) at 11/06/2021 1033 Last data filed at 11/06/2021 1001 Gross per 24 hour  Intake 480 ml  Output 2000 ml  Net -1520 ml    Constitutional :  alert, cooperative, appears stated age, and no distress  Respiratory:  clear to auscultation bilaterally  Cardiovascular:  regular rate and rhythm  Gastrointestinal: soft, non-tender; bowel sounds normal; no masses,  no organomegaly. Ventral hernia recurrence, but able to completely reduce again with minimal TTP  Skin: Cool and moist. Overlying skin at hernia site intact, no erythema  Psychiatric: Normal affect, non-agitated, not confused       LABS:  CMP Latest Ref Rng & Units 11/06/2021 11/05/2021 01/02/2021  Glucose 70 - 99 mg/dL 149(H) 137(H) 268(H)  BUN 8 - 23 mg/dL 36(H) 33(H) 43(H)  Creatinine 0.44 - 1.00 mg/dL 2.34(H) 2.31(H) 2.65(H)  Sodium 135 - 145  mmol/L 136 139 139  Potassium 3.5 - 5.1 mmol/L 3.5 4.4 5.0  Chloride 98 - 111 mmol/L 104 104 100  CO2 22 - 32 mmol/L 26 25 23   Calcium 8.9 - 10.3 mg/dL 8.1(L) 8.9 9.3  Total Protein 6.5 - 8.1 g/dL - 7.2 -  Total Bilirubin 0.3 - 1.2 mg/dL - 1.0 -  Alkaline Phos 38 - 126 U/L - 82 -  AST 15 - 41 U/L - 14(L) -  ALT 0 - 44 U/L - 10 -   CBC Latest Ref Rng & Units 11/06/2021 11/05/2021 01/02/2021  WBC 4.0 - 10.5 K/uL 12.2(H) 17.1(H) 10.0  Hemoglobin 12.0 - 15.0 g/dL 9.9(L) 12.1 13.8  Hematocrit 36.0 - 46.0 % 31.0(L) 38.7 41.8  Platelets 150 - 400 K/uL 229 324 313    RADS: N/a Assessment:   Ventral hernia, remains reducible.  Pt seems to be clinically improving from respiratory standpoint as well.  Discussed importance of monitoring hernia and keeping it reduced, avoiding straining.  We can discuss possible elective repair at a later date in the office once she is discharged.  Pt and husband verbalized understanding.  Surgery will be signing off, please call for any questions or concerns.

## 2021-11-06 NOTE — Progress Notes (Signed)
PROGRESS NOTE  Tiffany [name]    DOB: 1933/01/30, 87 y.o.  TSV:[id number]  PCP: Adin Hector, MD   Code Status: Full Code   DOA: 11/05/2021   LOS: 1  Brief Narrative of Current Hospitalization  Tiffany [name] is a 87 y.o. female with a PMH significant for A. fib s/p cardioversion 11/04/2021 on Xarelto, CHF, type II DM, HTN, HLD, ventral hernia, chronic lower back pain. They presented from home to the ED on 11/04/2021 with gradually worsening respiratory distress x 7 days.  She was found to be in A. fib RVR and she was treated with cardioversion and instructed to follow up with her cardiologist.   On 11/9, she had worsening of her ventral hernia while having a bowel movement.  She presented to her general surgery office who recommended presentation to the ED based on her poor respiratory status and apparent fluid overload.  In the ED, it was found that they had O2 saturations 90% on room air, pulmonary edema on chest x-ray with small left pleural effusion.  She was treated for CHF exacerbation with diuretics and supplemental oxygen.  Cardiology was consulted.  Patient was admitted to medicine service for further workup and management of CHF exacerbation as outlined in detail below.  11/06/21 -stable  Assessment & Plan  Active Problems:   Acute on chronic systolic CHF (congestive heart failure) (HCC)  Acute on chronic CHF exacerbation  Afib- s/p cardioversion 11/8. Denies chest pain. Troponins elevated likely in demand ischemia but trending upward. BNP elevated to 228.7 on presentation- mildly above baseline.  - cardiology following, appreciate recs - daily weights - strict I/Os - IV lasix BID - f/u echo - continuous tele - continue xarelto  Acute respiratory distress 2/2 hypervolemia- improved since persentation - wean to room air as tolerated - continue diuresis  Acute urinary retention with right hydronephrosis-Foley was placed 11/9. H/o stent on left with  hydronephrosis -Urology following, appreciate recommendations- f/u OP  - foley remains at discharge  Ventral hernia- chronic. not incarcerated. Not painful to pressure. Reducible. Non-operative at this time - general surgery consulted- appreciate recs - bowel regimen to avoid straining.   Type II DM- A1c 6.7 on admission which is well below goal of 8 for age. Blood sugars well controlled. - monitor blood sugars on regular labs  CKD IV- at baseline - monitor  Tophi left 2nd toe - WOC consulted.  - analgesia PRN  DVT prophylaxis:  Rivaroxaban (XARELTO) tablet 15 mg   Diet:  Diet Orders (From admission, onward)     Start     Ordered   11/05/21 1805  Diet heart healthy/carb modified Room service appropriate? Yes; Fluid consistency: Thin  Diet effective now       Question Answer Comment  Diet-HS Snack? Nothing   Room service appropriate? Yes   Fluid consistency: Thin      11/05/21 1805            Subjective 11/06/21    Pt reports feeling improved respiratory status and mild abdominal pain.   Disposition Plan & Communication  Patient status: Inpatient  Admitted From: Home Disposition: Home Anticipated discharge date: 11/13  Family Communication: husband at bedside  Consults, Procedures, Significant Events  Consultants:  Cardiology, general surgery, urology  Procedures/significant events:  Foley placed Antimicrobials:  Anti-infectives (From admission, onward)    Start     Dose/Rate Route Frequency Ordered Stop   11/06/21 0700  azithromycin (ZITHROMAX) 500 mg in sodium chloride 0.9 %  250 mL IVPB        500 mg 250 mL/hr over 60 Minutes Intravenous Every 24 hours 11/06/21 0608     11/06/21 0700  cefTRIAXone (ROCEPHIN) 2 g in sodium chloride 0.9 % 100 mL IVPB        2 g 200 mL/hr over 30 Minutes Intravenous  Once 11/06/21 0610         Objective   Vitals:   11/05/21 2206 11/06/21 0145 11/06/21 0435 11/06/21 0500  BP: (!) 142/79 119/62 117/68   Pulse: 93 79  72   Resp: 20 18 20    Temp: 98.8 F (37.1 C) 98.7 F (37.1 C) 98.2 F (36.8 C)   TempSrc: Oral Oral    SpO2: 100% 98% 100%   Weight:    90.1 kg  Height:        Intake/Output Summary (Last 24 hours) at 11/06/2021 0803 Last data filed at 11/06/2021 0440 Gross per 24 hour  Intake 480 ml  Output 2000 ml  Net -1520 ml   Filed Weights   11/05/21 1242 11/05/21 1715 11/06/21 0500  Weight: 92.5 kg 94 kg 90.1 kg    Patient BMI: Body mass index is 35.19 kg/m.   Physical Exam: General: awake, alert, NAD HEENT: atraumatic, clear conjunctiva, anicteric sclera, moist mucus membranes, hearing grossly normal Respiratory: normal respiratory effort. Cardiovascular: 1+ pitting edema Gastrointestinal: soft, upper left ventral hernia soft to palpation and non-tender. Reducible.  Nervous: A& conversant. no gross focal neurologic deficits, normal speech Extremities: moves all equally, 1+ edema, normal tone Skin: dry, intact, normal temperature, normal color, No rashes, lesions or ulcers on exposed skin Psychiatry: normal mood, congruent affect  Labs   I have personally reviewed following labs and imaging studies Admission on 11/05/2021  Component Date Value Ref Range Status   WBC 11/05/2021 17.1 (A)  4.0 - 10.5 K/uL Final   RBC 11/05/2021 4.41  3.87 - 5.11 MIL/uL Final   Hemoglobin 11/05/2021 12.1  12.0 - 15.0 g/dL Final   HCT 11/05/2021 38.7  36.0 - 46.0 % Final   MCV 11/05/2021 87.8  80.0 - 100.0 fL Final   MCH 11/05/2021 27.4  26.0 - 34.0 pg Final   MCHC 11/05/2021 31.3  30.0 - 36.0 g/dL Final   RDW 11/05/2021 14.3  11.5 - 15.5 % Final   Platelets 11/05/2021 324  150 - 400 K/uL Final   nRBC 11/05/2021 0.0  0.0 - 0.2 % Final   Troponin I (High Sensitivity) 11/05/2021 14  <18 ng/L Final   Prothrombin Time 11/05/2021 33.2 (A)  11.4 - 15.2 seconds Final   INR 11/05/2021 3.3 (A)  0.8 - 1.2 Final   Sodium 11/05/2021 139  135 - 145 mmol/L Final   Potassium 11/05/2021 4.4  3.5 - 5.1 mmol/L  Final   Chloride 11/05/2021 104  98 - 111 mmol/L Final   CO2 11/05/2021 25  22 - 32 mmol/L Final   Glucose, Bld 11/05/2021 137 (A)  70 - 99 mg/dL Final   BUN 11/05/2021 33 (A)  8 - 23 mg/dL Final   Creatinine, Ser 11/05/2021 2.31 (A)  0.44 - 1.00 mg/dL Final   Calcium 11/05/2021 8.9  8.9 - 10.3 mg/dL Final   Total Protein 11/05/2021 7.2  6.5 - 8.1 g/dL Final   Albumin 11/05/2021 3.7  3.5 - 5.0 g/dL Final   AST 11/05/2021 14 (A)  15 - 41 U/L Final   ALT 11/05/2021 10  0 - 44 U/L Final   Alkaline Phosphatase  11/05/2021 82  38 - 126 U/L Final   Total Bilirubin 11/05/2021 1.0  0.3 - 1.2 mg/dL Final   GFR, Estimated 11/05/2021 20 (A)  >60 mL/min Final   Anion gap 11/05/2021 10  5 - 15 Final   SARS Coronavirus 2 by RT PCR 11/05/2021 NEGATIVE  NEGATIVE Final   Influenza A by PCR 11/05/2021 NEGATIVE  NEGATIVE Final   Influenza B by PCR 11/05/2021 NEGATIVE  NEGATIVE Final   Lactic Acid, Venous 11/05/2021 2.2 (A)  0.5 - 1.9 mmol/L Final   B Natriuretic Peptide 11/05/2021 228.7 (A)  0.0 - 100.0 pg/mL Final   Troponin I (High Sensitivity) 11/05/2021 28 (A)  <18 ng/L Final   Weight 11/06/2021 3,178.15  oz In process   Height 11/06/2021 63  in In process   BP 11/06/2021 117/68  mmHg In process   Sodium 11/06/2021 136  135 - 145 mmol/L Final   Potassium 11/06/2021 3.5  3.5 - 5.1 mmol/L Final   Chloride 11/06/2021 104  98 - 111 mmol/L Final   CO2 11/06/2021 26  22 - 32 mmol/L Final   Glucose, Bld 11/06/2021 149 (A)  70 - 99 mg/dL Final   BUN 11/06/2021 36 (A)  8 - 23 mg/dL Final   Creatinine, Ser 11/06/2021 2.34 (A)  0.44 - 1.00 mg/dL Final   Calcium 11/06/2021 8.1 (A)  8.9 - 10.3 mg/dL Final   GFR, Estimated 11/06/2021 20 (A)  >60 mL/min Final   Anion gap 11/06/2021 6  5 - 15 Final   Procalcitonin 11/06/2021 0.84  ng/mL Final   WBC 11/06/2021 12.2 (A)  4.0 - 10.5 K/uL Final   RBC 11/06/2021 3.59 (A)  3.87 - 5.11 MIL/uL Final   Hemoglobin 11/06/2021 9.9 (A)  12.0 - 15.0 g/dL Final   HCT  11/06/2021 31.0 (A)  36.0 - 46.0 % Final   MCV 11/06/2021 86.4  80.0 - 100.0 fL Final   MCH 11/06/2021 27.6  26.0 - 34.0 pg Final   MCHC 11/06/2021 31.9  30.0 - 36.0 g/dL Final   RDW 11/06/2021 14.2  11.5 - 15.5 % Final   Platelets 11/06/2021 229  150 - 400 K/uL Final   nRBC 11/06/2021 0.0  0.0 - 0.2 % Final   Neutrophils Relative % 11/06/2021 74  % Final   Neutro Abs 11/06/2021 9.2 (A)  1.7 - 7.7 K/uL Final   Lymphocytes Relative 11/06/2021 15  % Final   Lymphs Abs 11/06/2021 1.8  0.7 - 4.0 K/uL Final   Monocytes Relative 11/06/2021 8  % Final   Monocytes Absolute 11/06/2021 1.0  0.1 - 1.0 K/uL Final   Eosinophils Relative 11/06/2021 1  % Final   Eosinophils Absolute 11/06/2021 0.1  0.0 - 0.5 K/uL Final   Basophils Relative 11/06/2021 1  % Final   Basophils Absolute 11/06/2021 0.1  0.0 - 0.1 K/uL Final   Immature Granulocytes 11/06/2021 1  % Final   Abs Immature Granulocytes 11/06/2021 0.06  0.00 - 0.07 K/uL Final   Cholesterol 11/06/2021 127  0 - 200 mg/dL Final   Triglycerides 11/06/2021 74  <150 mg/dL Final   HDL 11/06/2021 45  >40 mg/dL Final   Total CHOL/HDL Ratio 11/06/2021 2.8  RATIO Final   VLDL 11/06/2021 15  0 - 40 mg/dL Final   LDL Cholesterol 11/06/2021 67  0 - 99 mg/dL Final   Magnesium 11/06/2021 2.1  1.7 - 2.4 mg/dL Final   Phosphorus 11/06/2021 4.2  2.5 - 4.6 mg/dL Final   Troponin  I (High Sensitivity) 11/05/2021 116 (A)  <18 ng/L Final   Hgb A1c MFr Bld 11/05/2021 6.7 (A)  4.8 - 5.6 % Final   Mean Plasma Glucose 11/05/2021 145.59  mg/dL Final   Prothrombin Time 11/06/2021 28.0 (A)  11.4 - 15.2 seconds Final   INR 11/06/2021 2.6 (A)  0.8 - 1.2 Final   Troponin I (High Sensitivity) 11/05/2021 168 (A)  <18 ng/L Final   Glucose-Capillary 11/05/2021 162 (A)  70 - 99 mg/dL Final   Troponin I (High Sensitivity) 11/06/2021 215 (A)  <18 ng/L Final   Glucose-Capillary 11/06/2021 93  70 - 99 mg/dL Final    Imaging Studies  CT ABDOMEN PELVIS WO CONTRAST  Result Date:  11/05/2021 CLINICAL DATA:  Abduct abdominal pain EXAM: CT ABDOMEN AND PELVIS WITHOUT CONTRAST TECHNIQUE: Multidetector CT imaging of the abdomen and pelvis was performed following the standard protocol without IV contrast. COMPARISON:  12/02/2020 FINDINGS: Lower chest: Small bilateral pleural effusions are identified with mild interlobular septal thickening concerning for CHF. Hepatobiliary: No focal liver abnormality is seen. No gallstones, gallbladder wall thickening, or biliary dilatation. Pancreas: Unremarkable. No pancreatic ductal dilatation or surrounding inflammatory changes. Spleen: Normal in size without focal abnormality. Adrenals/Urinary Tract: Normal appearance of the adrenal glands. Exophytic cyst arises off the posteromedial cortex of the upper pole of left kidney measuring 2.6 cm. Moderate bilateral hydronephrosis and hydroureter is identified. There is a left-sided nephroureteral stent in place with proximal pigtail distal to the UPJ, image 60/5. The distal pigtail is in the urinary bladder. No signs of obstructing stone. Diffuse bladder wall trabeculation is identified. Stomach/Bowel: Stomach is normal. No small bowel wall thickening, inflammation or distension. Large ventral midline hernia is identified which contains fat as well as a loop of transverse colon, image 30/2. Signs of partial obstruction are identified with transition to decreased caliber distal transverse colon, image 25/2 Vascular/Lymphatic: Aortic atherosclerosis. No abdominopelvic adenopathy. Reproductive: Uterus and bilateral adnexa are unremarkable. Other: No free fluid or fluid collections but. No signs of pneumoperitoneum. Musculoskeletal: Postop change within the lumbar spine from PLIF at L4-5. Marked degenerative disc disease is noted at L1-2, L2-3 and L3-4. No acute osseous findings identified. IMPRESSION: 1. Large ventral midline hernia is identified which a partially obstructed loop of transverse colon. 2. Persistent  hydronephrosis status post placement of nephroureteral stent. The proximal pigtail of the ureteral stent is within the ureter, distal to the UPJ. 3. New right hydronephrosis and hydroureter to the level of the urinary bladder. No obstructing stone or mass noted. 4. Distended urinary bladder with diffuse bladder wall thickening. Suspect chronic bladder outlet obstruction. 5. Small bilateral pleural effusions with mild interlobular septal thickening concerning for CHF. 6. Aortic Atherosclerosis (ICD10-I70.0). Electronically Signed   By: Kerby Moors M.D.   On: 11/05/2021 15:35   DG Chest Portable 1 View  Result Date: 11/05/2021 CLINICAL DATA:  Shortness of breath, respiratory distress dress, history of hernia, diabetes mellitus, hypertension, atrial fibrillation EXAM: PORTABLE CHEST 1 VIEW COMPARISON:  Portable exam 1258 hours compared to 04/19/2012 FINDINGS: Normal heart size, mediastinal contours, and pulmonary vascularity. Sir aorta Bronchitic changes with scattered interstitial infiltrates in both lungs, could represent pulmonary edema or atypical infection. Tiny LEFT pleural effusion. No pneumothorax. Scattered endplate spur formation thoracic spine. IMPRESSION: Bronchitic changes with diffuse interstitial infiltrates, question pulmonary edema versus atypical infection. Tiny LEFT pleural effusion. Electronically Signed   By: Lavonia Dana M.D.   On: 11/05/2021 13:07   Medications   Scheduled Meds:  amiodarone  200 mg Oral BID   Chlorhexidine Gluconate Cloth  6 each Topical Daily   furosemide  40 mg Intravenous BID   insulin aspart  0-5 Units Subcutaneous QHS   insulin aspart  0-9 Units Subcutaneous TID WC   metoprolol succinate  25 mg Oral Daily   Rivaroxaban  15 mg Oral Daily   No recently discontinued medications to reconcile  LOS: 1 day   Time spent: >56min  Yuto Cajuste L Caroljean Monsivais, DO [facility] 11/06/2021, 8:03 AM   Please refer to amion to contact the [facility] Attending or Consulting  provider for this pt  [email or web address] Available by Epic secure chat 7AM-7PM. If 7PM-7AM, please contact night-coverage

## 2021-11-06 NOTE — Progress Notes (Signed)
*  PRELIMINARY RESULTS* Echocardiogram 2D Echocardiogram has been performed.  Tiffany [name] Tiffany [name] 11/06/2021, 8:31 AM

## 2021-11-07 LAB — GLUCOSE, CAPILLARY
Glucose-Capillary: 56 mg/dL — ABNORMAL LOW (ref 70–99)
Glucose-Capillary: 139 mg/dL — ABNORMAL HIGH (ref 70–99)
Glucose-Capillary: 122 mg/dL — ABNORMAL HIGH (ref 70–99)

## 2021-11-07 LAB — BASIC METABOLIC PANEL
Anion gap: 9 (ref 5–15)
BUN: 33 mg/dL — ABNORMAL HIGH (ref 8–23)
CO2: 28 mmol/L (ref 22–32)
Calcium: 8.3 mg/dL — ABNORMAL LOW (ref 8.9–10.3)
Chloride: 101 mmol/L (ref 98–111)
Creatinine, Ser: 2.39 mg/dL — ABNORMAL HIGH (ref 0.44–1.00)
GFR, Estimated: 19 mL/min — ABNORMAL LOW (ref >60)
Glucose, Bld: 57 mg/dL — ABNORMAL LOW (ref 70–99)
Potassium: 3.3 mmol/L — ABNORMAL LOW (ref 3.5–5.1)
Sodium: 138 mmol/L (ref 135–145)

## 2021-11-07 MED ORDER — DEXTROSE 50 % IV SOLN
1.0000 | Freq: Once | INTRAVENOUS | Status: DC
  Filled 2021-11-07: qty 50

## 2021-11-07 MED ORDER — POTASSIUM CHLORIDE CRYS ER 10 MEQ PO TBCR
40.0000 meq | EXTENDED_RELEASE_TABLET | Freq: Two times a day (BID) | ORAL | Status: DC
  Filled 2021-11-07: qty 2

## 2021-11-07 MED ORDER — TORSEMIDE 20 MG PO TABS: 10.0000 mg | ORAL_TABLET | Freq: Every day | ORAL | Status: DC

## 2021-11-07 MED ORDER — RIVAROXABAN 15 MG PO TABS: 15.0000 mg | ORAL_TABLET | Freq: Every day | ORAL | 0 refills | Status: DC

## 2021-11-07 MED ORDER — POTASSIUM CHLORIDE 20 MEQ PO PACK
40.0000 meq | PACK | Freq: Two times a day (BID) | ORAL | Status: DC
  Administered 2021-11-07: 40 meq via ORAL
  Filled 2021-11-07: qty 2

## 2021-11-07 NOTE — Anesthesia Postprocedure Evaluation (Signed)
Anesthesia Post Note  Patient: Tiffany [name]  Procedure(s) Performed: CARDIOVERSION  Patient location during evaluation: Specials Recovery Anesthesia Type: General Level of consciousness: awake and alert Pain management: pain level controlled Vital Signs Assessment: post-procedure vital signs reviewed and stable Respiratory status: spontaneous breathing, nonlabored ventilation, respiratory function stable and patient connected to nasal cannula oxygen Cardiovascular status: blood pressure returned to baseline and stable Postop Assessment: no apparent nausea or vomiting Anesthetic complications: no   No notable events documented.   Last Vitals:  Vitals:   11/04/21 0801 11/04/21 0802  BP:    Pulse: 66 70  Resp: (!) 30 (!) 32  Temp:    SpO2: 95% 95%    Last Pain:  Vitals:   11/04/21 0900  TempSrc:   PainSc: 0-No pain                 Martha Clan

## 2021-11-07 NOTE — Plan of Care (Signed)
  Problem: Education: Goal: Knowledge of General Education information will improve Description: Including pain rating scale, medication(s)/side effects and non-pharmacologic comfort measures Outcome: Adequate for Discharge   Problem: Health Behavior/Discharge Planning: Goal: Ability to manage health-related needs will improve Outcome: Adequate for Discharge   Problem: Clinical Measurements: Goal: Ability to maintain clinical measurements within normal limits will improve Outcome: Adequate for Discharge Goal: Will remain free from infection Outcome: Adequate for Discharge Goal: Diagnostic test results will improve Outcome: Adequate for Discharge Goal: Respiratory complications will improve Outcome: Adequate for Discharge Goal: Cardiovascular complication will be avoided Outcome: Adequate for Discharge   Problem: Activity: Goal: Risk for activity intolerance will decrease Outcome: Adequate for Discharge   Problem: Nutrition: Goal: Adequate nutrition will be maintained Outcome: Adequate for Discharge   Problem: Coping: Goal: Level of anxiety will decrease Outcome: Adequate for Discharge   Problem: Elimination: Goal: Will not experience complications related to bowel motility Outcome: Adequate for Discharge Goal: Will not experience complications related to urinary retention Outcome: Adequate for Discharge   Problem: Pain Managment: Goal: General experience of comfort will improve Outcome: Adequate for Discharge   Problem: Safety: Goal: Ability to remain free from injury will improve Outcome: Adequate for Discharge   Problem: Skin Integrity: Goal: Risk for impaired skin integrity will decrease Outcome: Adequate for Discharge   Problem: Education: Goal: Ability to demonstrate management of disease process will improve Outcome: Adequate for Discharge Goal: Ability to verbalize understanding of medication therapies will improve Outcome: Adequate for Discharge Goal:  Individualized Educational Video(s) Outcome: Adequate for Discharge   Problem: Activity: Goal: Capacity to carry out activities will improve Outcome: Adequate for Discharge   Problem: Cardiac: Goal: Ability to achieve and maintain adequate cardiopulmonary perfusion will improve Outcome: Adequate for Discharge  Pt dc home

## 2021-11-07 NOTE — Consult Note (Signed)
[facility] Consultation Note  Patient ID: Tiffany [name], MRN: [id number], DOB/AGE: 1933/06/19 87 y.o. Admit date: 11/05/2021   Date of Consult: 11/07/2021 Primary Physician: Adin Hector, MD Primary Cardiologist: Ubaldo Glassing  Chief Complaint: No chief complaint on file.  Reason for Consult:  Atrial fibrillation  HPI: 87 y.o. female with known paroxysmal nonvalvular atrial fibrillation hypertension and borderline hyperlipidemia who has had issues with abdominal and hiatal hernia previously on appropriate medication management with significant chronic kidney disease stage IV.  Her glomerular filtration rate has been 20.  Recently she had an episode of atrial fibrillation with rapid ventricular rate which significantly cause severe shortness of breath and likely some heart failure.  She had a chest x-ray which showed some pulmonary edema and a small pleural effusion and had some weight gain consistent with some heart failure.  This is multifactorial including chronic kidney disease atrial fibrillation and bundle branch block.  The patient received electrical cardioversion of atrial fibrillation to normal sinus rhythm without consequence but then had abdominal discomfort and hiatal hernia type symptoms for which she was admitted to the hospital.  These abdominal hernia symptoms have improved but the patient also had some diuresis with intravenous Lasix which helped a great deal with weight loss as well as from shortness of breath and pulmonary edema.  With this the patient also had an echocardiogram showing moderate to global LV systolic dysfunction with ejection fraction of 35% most consistent with atrial fibrillation with rapid ventricular rate and recent combined systolic diastolic dysfunction congestive heart failure.  The patient also has had an EKG showing normal sinus rhythm with bundle branch block at this time.  Troponin level has been 215 at peak and fairly flat not consistent with  any acute coronary syndrome or acute anginal symptoms requiring further intervention.  There has been a long discussion about medical management rather than further intervention and/or cardiac catheterization for these issues.  Currently she feels well with no anginal symptoms and significantly improved from the cardiac standpoint on appropriate medication management.  We have had some discussion of the amount of diuresis needed as an outpatient for which we have suggested her previous outpatient diuretic dose including Demadex.  Most likely will need to be performed each day  Past Medical History:  Diagnosis Date   A-fib ([place])    Anemia    Arthritis    Cataracts, both eyes    Chickenpox    Chronic kidney disease    Complication of anesthesia    Diabetes mellitus without complication ([place])    Dyspnea    with exertion   Dysrhythmia    Hyperlipidemia    Hypertension    PONV (postoperative nausea and vomiting)    with 1st pregnancy 50 years ago and no problem since then   Right leg weakness    Spinal stenosis    UTI (urinary tract infection)       Surgical History:  Past Surgical History:  Procedure Laterality Date   BACK SURGERY     x2 -cervical fusion and mid-lower back surgery   CARDIOVERSION     CARDIOVERSION N/A 11/04/2021   Procedure: CARDIOVERSION;  Surgeon: Teodoro Spray, MD;  Location: [facility] ORS;  Service: Cardiovascular;  Laterality: N/A;   CATARACT EXTRACTION, BILATERAL     CERVICAL [place]  12/06/2020   Procedure: CYSTOSCOPY WITH RETROGRADE PYELOGRAM;  Surgeon: Billey Co, MD;  Location: [facility] OR[facility];  Service: Urology;;  CYSTOSCOPY WITH STENT PLACEMENT Left 12/06/2020   Procedure: CYSTOSCOPY WITH STENT PLACEMENT;  Surgeon: Billey Co, MD;  Location: [facility];  Service: Urology;  Laterality: Left;   ELECTROPHYSIOLOGIC STUDY N/A 01/12/2017   Procedure: Cardioversion;  Surgeon: Teodoro Spray, MD;  Location: [facility];   Service: Cardiovascular;  Laterality: N/A;   EYE SURGERY     JOINT REPLACEMENT     SHOULDER SURGERY Right 2010   rotator cuff repair   TOTAL KNEE ARTHROPLASTY Left 05/23/2019   Procedure: TOTAL KNEE ARTHROPLASTY - LEFT - DIABETIC;  Surgeon: Corky Mull, MD;  Location: [facility];  Service: Orthopedics;  Laterality: Left;   URETEROSCOPY N/A 12/06/2020   Procedure: DIAGNOSTIC URETEROSCOPY;  Surgeon: Billey Co, MD;  Location: [facility];  Service: Urology;  Laterality: N/A;     Home Meds: Prior to Admission medications   Medication Sig Start Date End Date Taking? Authorizing Provider  acetaminophen (TYLENOL) 500 MG tablet Take 500 mg by mouth every 6 (six) hours as needed (pain).     [provider]  amiodarone (PACERONE) 200 MG tablet Take 200 mg by mouth 2 (two) times daily.    [provider]  glimepiride (AMARYL) 2 MG tablet Take 2 mg by mouth daily with breakfast.    [provider]  glucose blood (PRECISION QID TEST) test strip Use 2 (two) times daily Use as instructed. 05/03/20   [provider]  HYDROcodone-acetaminophen (NORCO/VICODIN) 5-325 MG tablet Take 0.5 tablets by mouth 2 (two) times daily as needed for moderate pain.    [provider]  insulin glargine, 1 Unit Dial, (TOUJEO SOLOSTAR) 300 UNIT/ML Solostar Pen Inject 11 Units into the skin daily. 10/24/20   [provider]  Insulin Pen Needle (BD PEN NEEDLE NANO U/F) 32G X 4 MM MISC USE WITH INSULIN PEN TWICE DAILY OR AS DIRECTED 10/18/19   [provider]  metoprolol succinate (TOPROL-XL) 25 MG 24 hr tablet Take 25 mg by mouth daily.    [provider]  NOVOLOG FLEXPEN 100 UNIT/ML FlexPen Inject 3-7 Units into the skin daily before supper. 08/30/20   [provider]  polyethylene glycol (MIRALAX / GLYCOLAX) 17 g packet Take 17 g by mouth daily as needed for moderate constipation.    [provider]  rivaroxaban (XARELTO) 20 MG TABS tablet  Take 20 mg by mouth daily with breakfast.    [provider]  torsemide (DEMADEX) 20 MG tablet Take 10 mg by mouth daily as needed (swelling). 01/04/21   [provider]    Inpatient Medications:   amiodarone  200 mg Oral BID   Chlorhexidine Gluconate Cloth  6 each Topical Daily   dextrose  1 ampule Intravenous Once   metoprolol succinate  25 mg Oral Daily   neomycin-bacitracin-polymyxin   Topical BID   polyethylene glycol  17 g Oral BID   potassium chloride  40 mEq Oral BID   Rivaroxaban  15 mg Oral Daily     Allergies:  Allergies  Allergen Reactions   Amlodipine Swelling   Codeine Nausea And Vomiting   Tizanidine     Didn't feel good taking it   Doxycycline Nausea And Vomiting    Social History   Socioeconomic History   Marital status: Married    Spouse name: Herbie [place]   Number of children: Not on file   Years of education: Not on file   Highest education level: Not on file  Occupational History   Not on  file  Tobacco Use   Smoking status: Never   Smokeless tobacco: Never  Vaping Use   Vaping Use: Never used  Substance and Sexual Activity   Alcohol use: Never   Drug use: Never   Sexual activity: Not Currently    Birth control/protection: Post-menopausal  Other Topics Concern   Not on file  Social History Narrative   Not on file   Social Determinants of Health   Financial Resource Strain: Not on file  Food Insecurity: Not on file  Transportation Needs: Not on file  Physical Activity: Not on file  Stress: Not on file  Social Connections: Not on file  Intimate Partner Violence: Not on file     Family History  Adopted: Yes     Review of Systems Positive for abdominal discomfort Negative for: General:  chills, fever, night sweats or weight changes.  Cardiovascular: PND orthopnea syncope dizziness  Dermatological skin lesions rashes Respiratory: Cough congestion Urologic: Frequent urination urination at night and hematuria Abdominal:  negative for nausea, vomiting, diarrhea, bright red blood per rectum, melena, or hematemesis Neurologic: negative for visual changes, and/or hearing changes  All other systems reviewed and are otherwise negative except as noted above.  Labs: No results for input(s): CKTOTAL, CKMB, TROPONINI in the last 72 hours. Lab Results  Component Value Date   WBC 12.2 (H) 11/06/2021   HGB 9.9 (L) 11/06/2021   HCT 31.0 (L) 11/06/2021   MCV 86.4 11/06/2021   PLT 229 11/06/2021    Recent Labs  Lab 11/05/21 1252 11/06/21 0411 11/07/21 0504  NA 139   < > 138  K 4.4   < > 3.3*  CL 104   < > 101  CO2 25   < > 28  BUN 33*   < > 33*  CREATININE 2.31*   < > 2.39*  CALCIUM 8.9   < > 8.3*  PROT 7.2  --   --   BILITOT 1.0  --   --   ALKPHOS 82  --   --   ALT 10  --   --   AST 14*  --   --   GLUCOSE 137*   < > 57*   < > = values in this interval not displayed.   Lab Results  Component Value Date   CHOL 127 11/06/2021   HDL 45 11/06/2021   LDLCALC 67 11/06/2021   TRIG 74 11/06/2021   No results found for: DDIMER  Radiology/Studies:  CT ABDOMEN PELVIS WO CONTRAST  Result Date: 11/05/2021 CLINICAL DATA:  Abduct abdominal pain EXAM: CT ABDOMEN AND PELVIS WITHOUT CONTRAST TECHNIQUE: Multidetector CT imaging of the abdomen and pelvis was performed following the standard protocol without IV contrast. COMPARISON:  12/02/2020 FINDINGS: Lower chest: Small bilateral pleural effusions are identified with mild interlobular septal thickening concerning for CHF. Hepatobiliary: No focal liver abnormality is seen. No gallstones, gallbladder wall thickening, or biliary dilatation. Pancreas: Unremarkable. No pancreatic ductal dilatation or surrounding inflammatory changes. Spleen: Normal in size without focal abnormality. Adrenals/Urinary Tract: Normal appearance of the adrenal glands. Exophytic cyst arises off the posteromedial cortex of the upper pole of left kidney measuring 2.6 cm. Moderate bilateral  hydronephrosis and hydroureter is identified. There is a left-sided nephroureteral stent in place with proximal pigtail distal to the UPJ, image 60/5. The distal pigtail is in the urinary bladder. No signs of obstructing stone. Diffuse bladder wall trabeculation is identified. Stomach/Bowel: Stomach is normal. No small bowel wall thickening, inflammation or distension. Large  ventral midline hernia is identified which contains fat as well as a loop of transverse colon, image 30/2. Signs of partial obstruction are identified with transition to decreased caliber distal transverse colon, image 25/2 Vascular/Lymphatic: Aortic atherosclerosis. No abdominopelvic adenopathy. Reproductive: Uterus and bilateral adnexa are unremarkable. Other: No free fluid or fluid collections but. No signs of pneumoperitoneum. Musculoskeletal: Postop change within the lumbar spine from PLIF at L4-5. Marked degenerative disc disease is noted at L1-2, L2-3 and L3-4. No acute osseous findings identified. IMPRESSION: 1. Large ventral midline hernia is identified which a partially obstructed loop of transverse colon. 2. Persistent hydronephrosis status post placement of nephroureteral stent. The proximal pigtail of the ureteral stent is within the ureter, distal to the UPJ. 3. New right hydronephrosis and hydroureter to the level of the urinary bladder. No obstructing stone or mass noted. 4. Distended urinary bladder with diffuse bladder wall thickening. Suspect chronic bladder outlet obstruction. 5. Small bilateral pleural effusions with mild interlobular septal thickening concerning for CHF. 6. Aortic Atherosclerosis (ICD10-I70.0). Electronically Signed   By: Kerby Moors M.D.   On: 11/05/2021 15:35   DG Chest Portable 1 View  Result Date: 11/05/2021 CLINICAL DATA:  Shortness of breath, respiratory distress dress, history of hernia, diabetes mellitus, hypertension, atrial fibrillation EXAM: PORTABLE CHEST 1 VIEW COMPARISON:  Portable exam  1258 hours compared to 04/19/2012 FINDINGS: Normal heart size, mediastinal contours, and pulmonary vascularity. Sir aorta Bronchitic changes with scattered interstitial infiltrates in both lungs, could represent pulmonary edema or atypical infection. Tiny LEFT pleural effusion. No pneumothorax. Scattered endplate spur formation thoracic spine. IMPRESSION: Bronchitic changes with diffuse interstitial infiltrates, question pulmonary edema versus atypical infection. Tiny LEFT pleural effusion. Electronically Signed   By: Lavonia Dana M.D.   On: 11/05/2021 13:07   ECHOCARDIOGRAM COMPLETE  Result Date: 11/06/2021    ECHOCARDIOGRAM REPORT   Patient Name:   Tiffany [name] Date of Exam: 11/06/2021 Medical Rec #:  [id number]        Height:       63.0 in Accession #:    [id number]       Weight:       198.6 lb Date of Birth:  March 27, 1933        BSA:          1.928 m Patient Age:    89 years         BP:           117/68 mmHg Patient Gender: F                HR:           78 bpm. Exam Location:  [facility] Procedure: 2D Echo, Color Doppler, Cardiac Doppler and Intracardiac            Opacification Agent Indications:     I50.21 congestive heart failure-Acute Systolic  History:         Patient has no prior history of Echocardiogram examinations.                  CKD, Signs/Symptoms:Shortness of Breath and Edema; Risk                  Factors:Hypertension, Diabetes and Dyslipidemia.  Sonographer:     Charmayne Sheer Referring Phys:  [id number] [place] Diagnosing Phys: Serafina Royals MD  Sonographer Comments: Suboptimal apical window and suboptimal subcostal window. IMPRESSIONS  1. Left ventricular ejection fraction, by estimation, is 30 to 35%. The left ventricle has  moderately decreased function. The left ventricle demonstrates regional wall motion abnormalities (see scoring diagram/findings for description). The left ventricular internal cavity size was mildly dilated. Left ventricular diastolic parameters are consistent with Grade  II diastolic dysfunction (pseudonormalization).  2. Right ventricular systolic function is normal. The right ventricular size is normal.  3. The mitral valve is normal in structure. Moderate mitral valve regurgitation.  4. The aortic valve is normal in structure. Aortic valve regurgitation is not visualized. FINDINGS  Left Ventricle: Left ventricular ejection fraction, by estimation, is 30 to 35%. The left ventricle has moderately decreased function. The left ventricle demonstrates regional wall motion abnormalities. Severe akinesis of the left ventricular, mid-apical anteroseptal wall, anterior wall, anterior segment and apical segment. Definity contrast agent was given IV to delineate the left ventricular endocardial borders. The left ventricular internal cavity size was mildly dilated. There is no left ventricular hypertrophy. Left ventricular diastolic parameters are consistent with Grade II diastolic dysfunction (pseudonormalization). Right Ventricle: The right ventricular size is normal. No increase in right ventricular wall thickness. Right ventricular systolic function is normal. Left Atrium: Left atrial size was normal in size. Right Atrium: Right atrial size was normal in size. Pericardium: There is no evidence of pericardial effusion. Mitral Valve: The mitral valve is normal in structure. Moderate mitral valve regurgitation. MV peak gradient, 4.8 mmHg. The mean mitral valve gradient is 2.0 mmHg. Tricuspid Valve: The tricuspid valve is normal in structure. Tricuspid valve regurgitation is mild. Aortic Valve: The aortic valve is normal in structure. Aortic valve regurgitation is not visualized. Aortic valve mean gradient measures 4.0 mmHg. Aortic valve peak gradient measures 6.9 mmHg. Aortic valve area, by VTI measures 1.69 cm. Pulmonic Valve: The pulmonic valve was normal in structure. Pulmonic valve regurgitation is not visualized. Aorta: The aortic root and ascending aorta are structurally normal, with  no evidence of dilitation. IAS/Shunts: No atrial level shunt detected by color flow Doppler.  LEFT VENTRICLE PLAX 2D LVIDd:         3.90 cm      Diastology LVIDs:         3.30 cm      LV e' lateral:   4.46 cm/s LV PW:         1.30 cm      LV E/e' lateral: 26.0 LV IVS:        1.00 cm LVOT diam:     1.90 cm LV SV:         42 LV SV Index:   22 LVOT Area:     2.84 cm  LV Volumes (MOD) LV vol d, MOD A2C: 104.0 ml LV vol d, MOD A4C: 122.0 ml LV vol s, MOD A2C: 61.5 ml LV vol s, MOD A4C: 64.3 ml LV SV MOD A2C:     42.5 ml LV SV MOD A4C:     122.0 ml LV SV MOD BP:      48.4 ml LEFT ATRIUM             Index LA diam:        3.80 cm 1.97 cm/m LA Vol (A2C):   43.4 ml 22.51 ml/m LA Vol (A4C):   35.4 ml 18.36 ml/m LA Biplane Vol: 40.7 ml 21.11 ml/m  AORTIC VALVE                    PULMONIC VALVE AV Area (Vmax):    1.74 cm     PV Vmax:  1.11 m/s AV Area (Vmean):   1.56 cm     PV Vmean:      71.800 cm/s AV Area (VTI):     1.69 cm     PV VTI:        0.172 m AV Vmax:           131.00 cm/s  PV Peak grad:  4.9 mmHg AV Vmean:          96.100 cm/s  PV Mean grad:  2.0 mmHg AV VTI:            0.249 m AV Peak Grad:      6.9 mmHg AV Mean Grad:      4.0 mmHg LVOT Vmax:         80.30 cm/s LVOT Vmean:        52.900 cm/s LVOT VTI:          0.148 m LVOT/AV VTI ratio: 0.59  AORTA Ao Root diam: 2.80 cm MITRAL VALVE                TRICUSPID VALVE MV Area (PHT): 3.79 cm     TR Peak grad:   43.8 mmHg MV Area VTI:   1.76 cm     TR Vmax:        331.00 cm/s MV Peak grad:  4.8 mmHg MV Mean grad:  2.0 mmHg     SHUNTS MV Vmax:       1.09 m/s     Systemic VTI:  0.15 m MV Vmean:      67.2 cm/s    Systemic Diam: 1.90 cm MV Decel Time: 200 msec MV E velocity: 116.00 cm/s MV A velocity: 75.80 cm/s MV E/A ratio:  1.53 Serafina Royals MD Electronically signed by Serafina Royals MD Signature Date/Time: 11/06/2021/12:01:18 PM    Final     EKG: Normal sinus rhythm with left bundle branch block  Weights: Filed Weights   11/07/21 0016 11/07/21 0500  11/07/21 1014  Weight: 98.1 kg 98.1 kg 91.2 kg     Physical Exam: Blood pressure (!) 143/78, pulse 80, temperature 98.4 F (36.9 C), resp. rate 18, height 5\' 3"  (1.6 m), weight 91.2 kg, SpO2 95 %. Body mass index is 35.62 kg/m. General: Well developed, well nourished, in no acute distress. Head eyes ears nose throat: Normocephalic, atraumatic, sclera non-icteric, no xanthomas, nares are without discharge. No apparent thyromegaly and/or mass  Lungs: Normal respiratory effort.  no wheezes, no rales, no rhonchi.  Few basilar crackles Heart: RRR with normal S1 S2. no murmur gallop, no rub, PMI is normal size and placement, carotid upstroke normal without bruit, jugular venous pressure is normal Abdomen: Soft, non-tender, non-distended with normoactive bowel sounds. No hepatomegaly. No rebound/guarding. No obvious abdominal masses. Abdominal aorta is normal size without bruit Extremities: Trace edema. no cyanosis, no clubbing, no ulcers  Peripheral : 2+ bilateral upper extremity pulses, 2+ bilateral femoral pulses, 2+ bilateral dorsal pedal pulse Neuro: Alert and oriented. No facial asymmetry. No focal deficit. Moves all extremities spontaneously. Musculoskeletal: Normal muscle tone without kyphosis Psych:  Responds to questions appropriately with a normal affect.    Assessment: 87 year old female with hypertension hyperlipidemia chronic kidney disease stage IV paroxysmal nonvalvular atrial fibrillation with acute systolic dysfunction congestive heart failure likely multifactorial in nature without evidence of acute coronary syndrome or myocardial infarction needing medication management and further treatment options  Plan: 1.  Continuation of amiodarone at current dose for treatment of atrial fibrillation and maintaining normal  rhythm and will adjust dose as an outpatient 2.  Continue metoprolol for LV systolic dysfunction multifactorial in nature likely improved in the near future with this  medication management and maintenance of normal sinus rhythm 3.  Continue anticoagulation for further risk reduction and stroke with atrial fibrillation with rivaroxaban for which the patient has tolerated well and adjustments of dose as necessary as an outpatient 4.  No further cardiac intervention or diagnostics necessary at this time due to patient's improvements of symptoms multifactorial in nature and no current evidence of acute coronary syndrome Cysts 5.  Patient will continue Demadex at 10 mg each day for further risk reduction in recurrence of pulmonary edema and further adjustments will occur as an outpatient 6.  Okay for ambulation and potential discharge to home if patient feeling quite well  Signed, Corey Skains M.D. [facility] 11/07/2021, 12:44 PM

## 2021-11-07 NOTE — Plan of Care (Signed)
  Problem: Health Behavior/Discharge Planning: Goal: Ability to manage health-related needs will improve Outcome: Progressing   Problem: Clinical Measurements: Goal: Respiratory complications will improve Outcome: Progressing   Problem: Elimination: Goal: Will not experience complications related to bowel motility Outcome: Progressing   Problem: Elimination: Goal: Will not experience complications related to urinary retention Outcome: Progressing   Problem: Safety: Goal: Ability to remain free from injury will improve Outcome: Progressing

## 2021-11-07 NOTE — Consult Note (Signed)
  Heart Failure Nurse Navigator Note   HFrEF 30 to 35%.  Grade 2 diastolic dysfunction.  Right ventricular systolic function.  Moderate mitral regurgitation.  Mild tricuspid regurgitation.  She presented to the emergency room with complaints of worsening shortness of breath, she was seen at the general surgery office and noted to be in acute respiratory distress tachypneic with respiratory ration rates in the 30s and using accessory muscles to breathe.   Comorbidities:  Atrial fibrillation Anemia Arthritis Chronic kidney disease Hyperlipidemia Hypertension  Medications:  Amiodarone 200 mg 2 times a day metoprolol succinate 25 mg daily Potassium chloride 40 mEq 2 times a day Xarelto 15 mg daily Torsemide 10 mg daily  Labs:  Sodium 138, potassium 3.3 chloride 101, CO2 28, BUN 33, creatinine 2.39, total cholesterol 127, triglycerides 74, HDL 45, LDL 67. Weight is 91.2 kg Blood pressure 143/78  Initial meeting with the patient and her husband who was at the bedside, he is a retired Administrator, [employer].  Discussed heart failure, husband does all the cooking he does not add salt when cooking nor does she added at the table.  They rarely eat in restaurant but will once in a while get fried chicken.  Discussed the importance of weighing daily and reporting 2 pound weight gain overnight or 5 pounds within a week.  Discussed fluid restriction of 64 ounces or less.  Also discussed follow-up in the outpatient heart failure clinic but husband declines at this time.  They were given the living with heart failure teaching booklet along with low-sodium information and information about the outpatient heart failure clinic.  Pricilla Riffle RN [facility]

## 2021-11-07 NOTE — Discharge Summary (Signed)
Physician Discharge Summary  Patient ID: Tiffany [name] MRN: [id number] DOB/AGE: 85/11/1933 85 y.o.  Admit date: 11/05/2021 Discharge date: 11/07/2021  Admission Diagnoses: acute on chronic HFrEF  Discharge Diagnoses:  Active Problems:   Acute on chronic systolic CHF (congestive heart failure) (HCC)   Acute on chronic congestive heart failure (HCC)   Atrial fibrillation, chronic (HCC)   Ventral hernia without obstruction or gangrene   Hydronephrosis with ureteral stricture, not elsewhere classified   Discharged Condition: good  Hospital Course: patient admitted with acute respiratory distress which was brought on by exacerbation of heart failure. Her echo on admission revealed worsening EF of 30-35% from recent imaging showing around 40%.  She was treated with supplemental oxygen until weaned and diuresis. Cardiology was consulted and recommended discharge on second day of admission due to already being on optimal medications and ORA. Patient was persistent on wanting to be discharged today. For appearance of hypervolemia on exam, on not having tested her respiratory status with any form of exertion, I recommended continued inpatient diuresis and monitoring. She also had elevated troponins trending up with anterior/septal hypokinesis on echo which I thought warranted further evaluation but potentially attributed to demand ischemia in setting of recent Afib RVR. Patient also declined invasive interventions. Additionally, R hydronephrosis was seen on CT. Urology was consulted and familiar with patient as she had previous stent placement on left for similar and had follow up scheduled already. They recommended keeping in foley until follow up appointment. Patient had good UOP.  Her chronic ventral hernia remained stable and was evaluated by general surgery during her stay as non-operative at this time.   Consults: cardiology, general surgery, and urology  Significant Diagnostic Studies:  echo  Treatments: diuresis  Discharge Exam: Blood pressure (!) 143/78, pulse 80, temperature 98.4 F (36.9 C), resp. rate 18, height 5\' 3"  (1.6 m), weight 91.2 kg, SpO2 95 %. Physical Exam Vitals and nursing note reviewed. Exam conducted with a chaperone present.  Constitutional:      General: She is not in acute distress.    Appearance: Normal appearance. She is obese. She is not ill-appearing or toxic-appearing.  HENT:     Mouth/Throat:     Mouth: Mucous membranes are moist.  Cardiovascular:     Rate and Rhythm: Normal rate and regular rhythm.  Pulmonary:     Effort: Pulmonary effort is normal. No respiratory distress.     Breath sounds: Normal breath sounds. No wheezing.  Abdominal:     Palpations: Abdomen is soft.     Hernia: A hernia is present.  Musculoskeletal:     Right lower leg: Edema present.     Left lower leg: Edema present.  Skin:    General: Skin is warm and dry.     Capillary Refill: Capillary refill takes less than 2 seconds.  Neurological:     General: No focal deficit present.     Mental Status: She is alert and oriented to person, place, and time.  Psychiatric:        Mood and Affect: Mood normal.        Behavior: Behavior normal.    Disposition: home  Discharge Instructions     Amb Referral to HF Clinic   Complete by: As directed    Discharge patient   Complete by: As directed    Discharge disposition: 01-Home or Self Care   Discharge patient date: 11/07/2021      Allergies as of 11/07/2021  Reactions   Amlodipine Swelling   Codeine Nausea And Vomiting   Tizanidine    Didn't feel good taking it   Doxycycline Nausea And Vomiting        Medication List     TAKE these medications    acetaminophen 500 MG tablet Commonly known as: TYLENOL Take 500 mg by mouth every 6 (six) hours as needed (pain).   amiodarone 200 MG tablet Commonly known as: PACERONE Take 200 mg by mouth 2 (two) times daily.   BD Pen Needle Nano U/F 32G X  4 MM Misc Generic drug: Insulin Pen Needle USE WITH INSULIN PEN TWICE DAILY OR AS DIRECTED   glimepiride 2 MG tablet Commonly known as: AMARYL Take 2 mg by mouth daily with breakfast.   HYDROcodone-acetaminophen 5-325 MG tablet Commonly known as: NORCO/VICODIN Take 0.5 tablets by mouth 2 (two) times daily as needed for moderate pain.   metoprolol succinate 25 MG 24 hr tablet Commonly known as: TOPROL-XL Take 25 mg by mouth daily.   NovoLOG FlexPen 100 UNIT/ML FlexPen Generic drug: insulin aspart Inject 3-7 Units into the skin daily before supper.   polyethylene glycol 17 g packet Commonly known as: MIRALAX / GLYCOLAX Take 17 g by mouth daily as needed for moderate constipation.   Precision QID Test test strip Generic drug: glucose blood Use 2 (two) times daily Use as instructed.   Rivaroxaban 15 MG Tabs tablet Commonly known as: XARELTO Take 1 tablet (15 mg total) by mouth daily with breakfast. What changed:  medication strength how much to take   torsemide 20 MG tablet Commonly known as: DEMADEX Take 0.5 tablets (10 mg total) by mouth daily. What changed:  when to take this reasons to take this   Toujeo SoloStar 300 UNIT/ML Solostar Pen Generic drug: insulin glargine (1 Unit Dial) Inject 11 Units into the skin daily.         Signed: Richarda Osmond 11/07/2021, 3:40 PM

## 2021-11-07 NOTE — Progress Notes (Signed)
PT Cancellation Note  Patient Details Name: Tiffany [name] MRN: [id number] DOB: 1933/12/09   Cancelled Treatment:    Reason Eval/Treat Not Completed: Other (comment).  PT consult received.  Chart reviewed.  Pt resting in bed upon PT arrival with OT present (and pt's husband present).  Pt declining any OOB mobility (pt reporting foot pain when standing earlier with nursing and pt also reports she did not want to get SOB with activity).  Pt reporting having stairs to enter home but pt declined to practice them today with therapy.  Therapy encouraged mobility (to assist with safe home discharge) but pt continued to firmly decline any OOB mobility (pt focusing on going home today and pt reported she would be able to do what she needed to do in terms of mobility to safely discharge home today and would have additional help at home).  Will re-attempt PT evaluation tomorrow if pt does not discharge home today.  Leitha Bleak, PT 11/07/21, 1:36 PM

## 2021-11-07 NOTE — Discharge Instructions (Signed)
For your heart failure- ejection fraction has decreased to 30-35% and you are on appropriate medications to treat. Please continue and follow up with your cardiologist in the next 1-2 weeks.  Keep appointment with your primary provider in next week to recheck your potassium as this can decrease as a side effect of your heart failure medications.  Your dose of eliquis has been slightly decreased based on your kidney function. Please follow up with your cardiologist.  Your urine catheter will stay in until follow up with urology.  Please follow up with surgery in regard to your hernia if having concerns.

## 2021-11-07 NOTE — Evaluation (Signed)
Occupational Therapy Evaluation Patient Details Name: Tiffany [name] MRN: [id number] DOB: [birth year] Today's Date: 11/07/2021   History of Present Illness Tiffany [name] is a 87 y.o. female with medical history significant for chronic A. fib status post cardioversion on 11/04/2021, on Xarelto, chronic systolic CHF, type 2 diabetes, essential hypertension, hyperlipidemia, ventral hernia for 2 to 3 years, chronic lower back pain, who presented to [facility] ED from general surgery's office due to acute respiratory distress.   Clinical Impression   Tiffany [name] was seen for OT evaluation this date. Prior to hospital admission, pt was MOD I for ADLs and household mobility using RW, w/c for community mobility. Pt lives with spouse who is retired Engineer, drilling and has 2 adult children available PRN. Pt presents to acute OT demonstrating impaired ADL performance and functional mobility 2/2 decreased activity tolerance a nd functional strength/balance/ROM deficits.   Pt currently requires SUPERVISION for bed mobility. MAX A don B socks seated EOB. SETUP + SUPERVISION for static sitting ~5 min. Anticipate assist for ADL t/f however pt refuses citing BLE pain and concern for SOB. States she will only stand once ready for her w/c to leave. Pt and spouse instructed in safety upon d/c home and provided gait belt. Pt would benefit from skilled OT to address noted impairments and functional limitations (see below for any additional details) in order to maximize safety and independence while minimizing falls risk and caregiver burden. Upon hospital discharge, recommend HHOT c 24/7 SUPERVISION pending pt participation in [place].      Recommendations for follow up therapy are one component of a multi-disciplinary discharge planning process, led by the attending physician.  Recommendations may be updated based on patient status, additional functional criteria and insurance authorization.   Follow Up Recommendations   Home health OT    Assistance Recommended at Discharge Frequent or constant Supervision/Assistance  Functional Status Assessment  Patient has had a recent decline in their functional status and demonstrates the ability to make significant improvements in function in a reasonable and predictable amount of time.  Equipment Recommendations  None recommended by OT    Recommendations for Other Services       Precautions / Restrictions Precautions Precautions: Fall Restrictions Weight Bearing Restrictions: No      Mobility Bed Mobility Overal bed mobility: Needs Assistance Bed Mobility: Supine to Sit;Sit to Supine     Supine to sit: Supervision;HOB elevated Sit to supine: Supervision;HOB elevated        Transfers                   General transfer comment: pt refuses mobility citing BLE pain and concern for SOB. States she will only stand once ready for her w/c to leave      Balance Overall balance assessment: Needs assistance Sitting-balance support: No upper extremity supported;Feet supported Sitting balance-Leahy Scale: Fair                                     ADL either performed or assessed with clinical judgement   ADL Overall ADL's : Needs assistance/impaired                                       General ADL Comments: MAX A don B socks seated EOB. SETUP + SUPERVISION for static sitting ~5 min.  Anticipate assist for ADL t/f however pt refuses citing painand concern for SOB      Pertinent Vitals/Pain Pain Assessment: Faces Faces Pain Scale: Hurts even more Pain Location: RLE with ROM Pain Descriptors / Indicators: Discomfort;Dull;Grimacing Pain Intervention(s): Limited activity within patient's tolerance;Repositioned     Hand Dominance Right   Extremity/Trunk Assessment Upper Extremity Assessment Upper Extremity Assessment: Generalized weakness   Lower Extremity Assessment Lower Extremity Assessment: Generalized  weakness       Communication Communication Communication: No difficulties   Cognition Arousal/Alertness: Awake/alert Behavior During Therapy: WFL for tasks assessed/performed Overall Cognitive Status: Within Functional Limits for tasks assessed                                 General Comments: Pt is self-limiting     General Comments       Exercises Exercises: Other exercises Other Exercises Other Exercises: Pt educated re: OT role, DME recs, d/c recs, falls prevnetion, ECS, safe mobility Other Exercises: LBD, sup<>sit, sitting balance/tolerance   Shoulder Instructions      Home Living Family/patient expects to be discharged to:: Private residence Living Arrangements: Spouse/significant other Available Help at Discharge: Family;Available 24 hours/day Type of Home: House Home Access: Stairs to enter [employer] of Steps: 4 Entrance Stairs-Rails: Can reach both Home Layout: Able to live on main level with bedroom/bathroom               Home Equipment: Conservation officer, nature (2 wheels);Wheelchair - manual;BSC/3in1          Prior Functioning/Environment Prior Level of Function : Independent/Modified Independent             Mobility Comments: RW for household mobility, w/c for community mobility          OT Problem List: Decreased strength;Decreased activity tolerance;Impaired balance (sitting and/or standing);Decreased safety awareness      OT Treatment/Interventions: Self-care/ADL training;Therapeutic exercise;Energy conservation;DME and/or AE instruction;Therapeutic activities;Balance training;Patient/family education    OT Goals(Current goals can be found in the care plan section) Acute Rehab OT Goals Patient Stated Goal: to go home today OT Goal Formulation: With patient Time For Goal Achievement: 11/21/21 Potential to Achieve Goals: Fair ADL Goals Pt Will Perform Grooming: with modified independence;sitting Pt Will Perform  Lower Body Dressing: with modified independence;sitting/lateral leans Pt Will Transfer to Toilet: with min assist;ambulating;regular height toilet (c LRAD PRN)  OT Frequency: Min 2X/week   Barriers to D/C:            Co-evaluation              AM-PAC OT "6 Clicks" Daily Activity     Outcome Measure Help from another person eating meals?: None Help from another person taking care of personal grooming?: A Little Help from another person toileting, which includes using toliet, bedpan, or urinal?: A Little Help from another person bathing (including washing, rinsing, drying)?: A Little Help from another person to put on and taking off regular upper body clothing?: A Little Help from another person to put on and taking off regular lower body clothing?: A Lot 6 Click Score: 18   End of Session    Activity Tolerance: Patient limited by pain Patient left: in bed;with call bell/phone within reach;with family/visitor present  OT Visit Diagnosis: Other abnormalities of gait and mobility (R26.89)                Time: 4709-6283 OT Time Calculation (min):  15 min Charges:  OT General Charges $OT Visit: 1 Visit OT Evaluation $OT Eval Low Complexity: 1 Low  Dessie Coma, M.S. OTR/L  11/07/21, 2:17 PM  ascom [phone]

## 2021-11-10 ENCOUNTER — Other Ambulatory Visit: Payer: PPO

## 2021-11-10 NOTE — Telephone Encounter (Signed)
I called pt to schedule appt on Thursday 11/17 for OV w/[facility] and lab on Wednesday (BMP) prior to appt.  Pt's husband wanted to cancel both appts due to pt being in-pt at hospital.

## 2021-11-10 NOTE — Telephone Encounter (Signed)
[facility] sent the following staff message on Thursday 11/10  Please schedule a visit with me on Thursday 11/17 at 8 AM, and return in afternoon for a PVR.  She will need a renal ultrasound prior(ordered), and a BMP the day before, thank you  I s/w pt's husband and cancelled BOTH appts, due to pt being in-pt.  Pt called back and I r/s lab appt and OV w/[facility]  11/16 & 11/17  Pt's husband informed me he had removed pt's catheter this morning 11/14.  He was instructed if she was unable to urinate, to bring pt to office to put cath back in by 2:00.

## 2021-11-11 NOTE — Addendum Note (Signed)
Addended by: Despina Hidden on: 11/11/2021 04:12 PM   Modules accepted: Orders

## 2021-11-11 NOTE — Addendum Note (Signed)
Addended by: Despina Hidden on: 11/11/2021 03:51 PM   Modules accepted: Orders

## 2021-11-12 ENCOUNTER — Other Ambulatory Visit: Payer: PPO

## 2021-11-12 ENCOUNTER — Other Ambulatory Visit: Payer: Self-pay

## 2021-11-12 ENCOUNTER — Ambulatory Visit
Admission: RE | Admit: 2021-11-12 | Discharge: 2021-11-12 | Disposition: A | Discharge status: Home / Self Care | Payer: PPO | Source: Ambulatory Visit | Attending: Urology | Admitting: Urology

## 2021-11-12 ENCOUNTER — Ambulatory Visit: Payer: PPO | Admitting: Urology

## 2021-11-12 DIAGNOSIS — N1330 Unspecified hydronephrosis: Secondary | ICD-10-CM | POA: Diagnosis not present

## 2021-11-12 DIAGNOSIS — N184 Chronic kidney disease, stage 4 (severe): Secondary | ICD-10-CM | POA: Diagnosis not present

## 2021-11-12 DIAGNOSIS — N1339 Other hydronephrosis: Secondary | ICD-10-CM | POA: Diagnosis not present

## 2021-11-12 DIAGNOSIS — M109 Gout, unspecified: Secondary | ICD-10-CM

## 2021-11-12 DIAGNOSIS — N281 Cyst of kidney, acquired: Secondary | ICD-10-CM | POA: Diagnosis not present

## 2021-11-13 ENCOUNTER — Ambulatory Visit: Payer: PPO | Admitting: Urology

## 2021-11-13 ENCOUNTER — Encounter: Payer: Self-pay | Admitting: Urology

## 2021-11-13 VITALS — HR 80 | Ht 63.0 in | Wt 194.0 lb

## 2021-11-13 DIAGNOSIS — N1339 Other hydronephrosis: Secondary | ICD-10-CM | POA: Diagnosis not present

## 2021-11-13 DIAGNOSIS — R339 Retention of urine, unspecified: Secondary | ICD-10-CM

## 2021-11-13 LAB — BASIC METABOLIC PANEL
BUN/Creatinine Ratio: 23 (ref 12–28)
BUN: 45 mg/dL — ABNORMAL HIGH (ref 8–27)
CO2: 25 mmol/L (ref 20–29)
Calcium: 9.1 mg/dL (ref 8.7–10.3)
Chloride: 98 mmol/L (ref 96–106)
Creatinine, Ser: 1.94 mg/dL — ABNORMAL HIGH (ref 0.57–1.00)
Glucose: 253 mg/dL — ABNORMAL HIGH (ref 70–99)
Potassium: 4.6 mmol/L (ref 3.5–5.2)
Sodium: 141 mmol/L (ref 134–144)
eGFR: 25 mL/min/{1.73_m2} — ABNORMAL LOW (ref >59)

## 2021-11-13 LAB — URIC ACID: Uric Acid: 9.7 mg/dL — ABNORMAL HIGH (ref 3.1–7.9)

## 2021-11-13 NOTE — Progress Notes (Signed)
   11/13/2021 3:41 PM   Tiffany [name] 09-24-1933 [id number]  Reason for visit: Follow up hydronephrosis, incomplete bladder emptying, CKD  HPI: 87 year old female recently admitted for shortness of breath and CHF exacerbation and rising troponins.  A CT was performed for evaluation of a ventral hernia to evaluate for incarceration and showed bilateral hydroureteronephrosis down to a distended bladder with a left ureteral stent in place.  She underwent a diagnostic ureteroscopy in December 2021 that showed no evidence of obstruction and a stent was placed, and her renal function improved.  I suspect that she has a component of incomplete bladder emptying that is causing reflux/obstruction and the intermittent hydronephrosis.  I had recommended follow-up with a Foley catheter in place and a renal/bladder ultrasound to evaluate if hydronephrosis resolved with catheter in place, however her husband decided to remove the catheter early at home on Monday, and so the ultrasound was performed yesterday without a catheter in place.  PVR was mildly elevated at 220 mL on renal ultrasound.  We reviewed these results at length, and resolution of the prior right-sided hydronephrosis on ultrasound.  Renal function is essentially at baseline with recent yesterday at 1.94 (eGFR 25 ).  She denies any urinary symptoms today, and actually feels like she has been urinating better since discharge from the hospital.  We discussed options including stent removal versus stent exchange, urodynamics, Foley placement, suprapubic tube, and or intermittent catheterization.  She may be interested in intermittent catheterization in the future, but currently she would like to work on recovering from her recent hospitalization for CHF, and her current gout flare.  I think is very reasonable.  They would like to follow-up in February to rediscuss stent options.  I think it would be reasonable to remove the left ureteral stent at  that time and encouraged timed voiding or teach her CIC.  Follow-up early February to re-discuss stent removal versus exchange Continue timed voiding, return precautions discussed at length  I spent 30 total minutes on the day of the encounter including pre-visit review of the medical record, face-to-face time with the patient, and post visit ordering of labs/imaging/tests.   Billey Co, [facility] [address] [place], [place] [address] [phone]

## 2021-11-18 ENCOUNTER — Ambulatory Visit: Payer: PPO | Admitting: Family

## 2021-12-08 DIAGNOSIS — I480 Paroxysmal atrial fibrillation: Secondary | ICD-10-CM | POA: Diagnosis not present

## 2021-12-08 DIAGNOSIS — I5022 Chronic systolic (congestive) heart failure: Secondary | ICD-10-CM | POA: Diagnosis not present

## 2021-12-11 IMAGING — DX DG CHEST 1V PORT
1 series · 1 of 1 positions shown · non-contrast
Comparison: Portable exam 9791 hours compared to 04/19/2012

CLINICAL DATA: Shortness of breath, respiratory distress dress,
history of hernia, diabetes mellitus, hypertension, atrial
fibrillation

EXAM:
PORTABLE CHEST 1 VIEW

[chest ap]
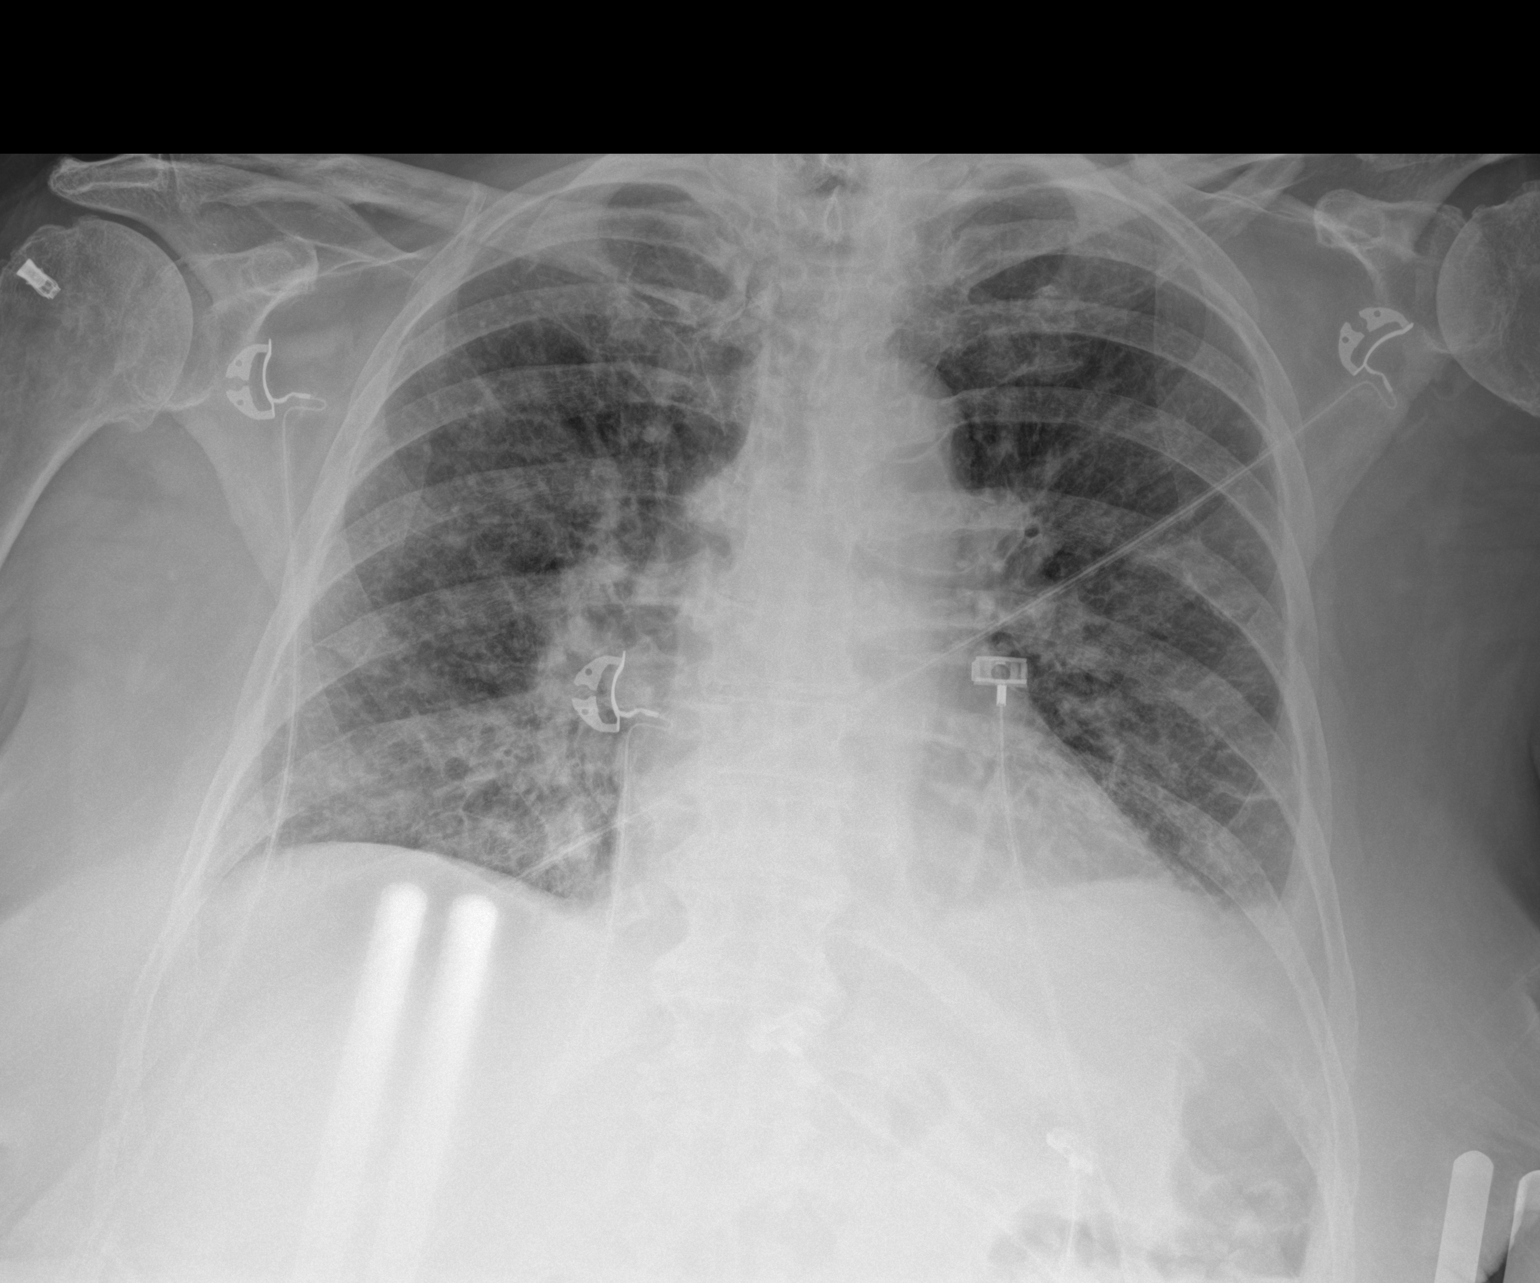

[1 of 1 positions shown; findings below may reference images not displayed]

FINDINGS: Normal heart size, mediastinal contours, and pulmonary vascularity.

Sir aorta

Bronchitic changes with scattered interstitial infiltrates in both
lungs, could represent pulmonary edema or atypical infection.

Tiny LEFT pleural effusion.

No pneumothorax.

Scattered endplate spur formation thoracic spine.
IMPRESSION: Bronchitic changes with diffuse interstitial infiltrates, question
pulmonary edema versus atypical infection.

Tiny LEFT pleural effusion.

## 2021-12-11 IMAGING — CT CT ABD-PELV W/O CM
2 of 4 series · 16 of 46 positions shown, 18 images · non-contrast
Comparison: 12/02/2020

CLINICAL DATA: Abduct abdominal pain

EXAM:
CT ABDOMEN AND PELVIS WITHOUT CONTRAST
TECHNIQUE: Multidetector CT imaging of the abdomen and pelvis was performed
following the standard protocol without IV contrast.

[Series 2: routine abd/pel wo · axial · 0.82mm/px · z∈[-462,-77]mm · 13 of 85 slices shown, 15 images]
[im 4/85  soft-tissue]
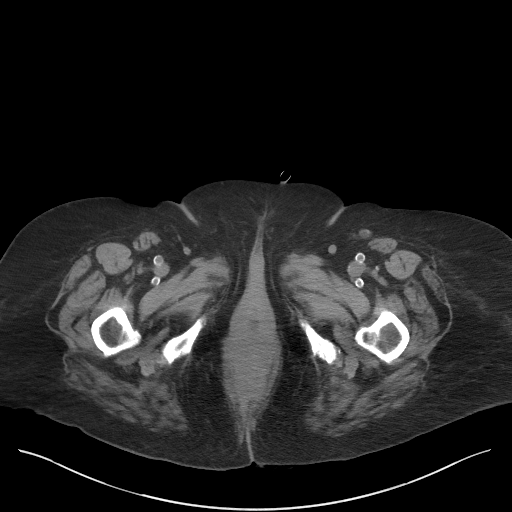
[im 4/85  bone]
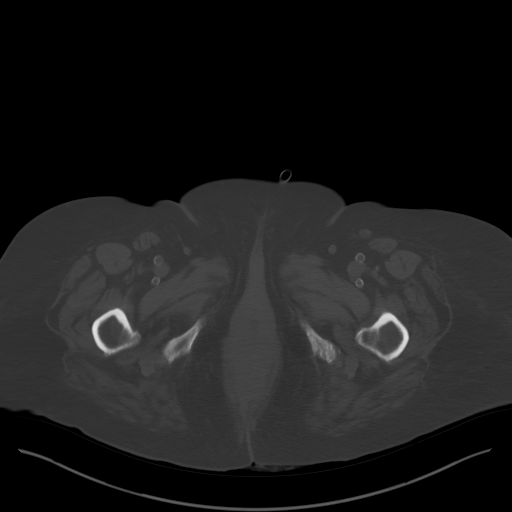
[im 10/85  soft-tissue]
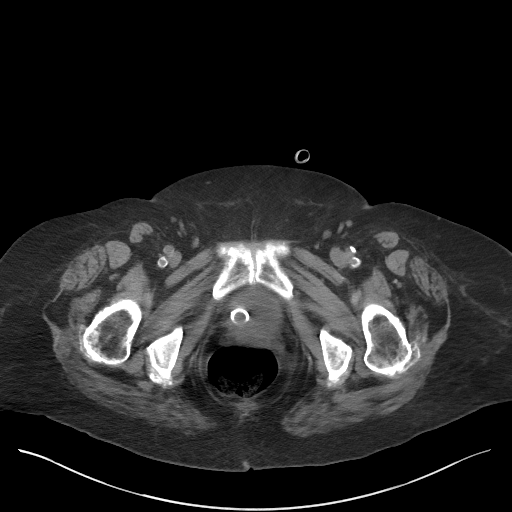
[im 17/85  soft-tissue]
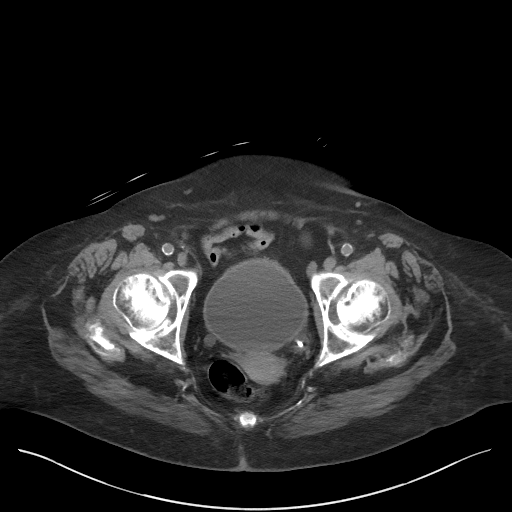
[im 23/85  soft-tissue]
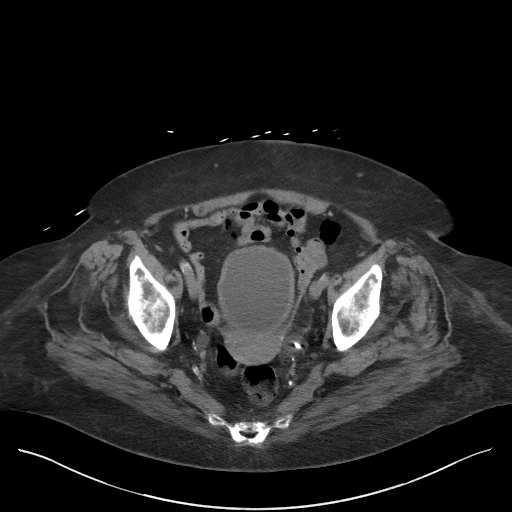
[im 30/85  soft-tissue]
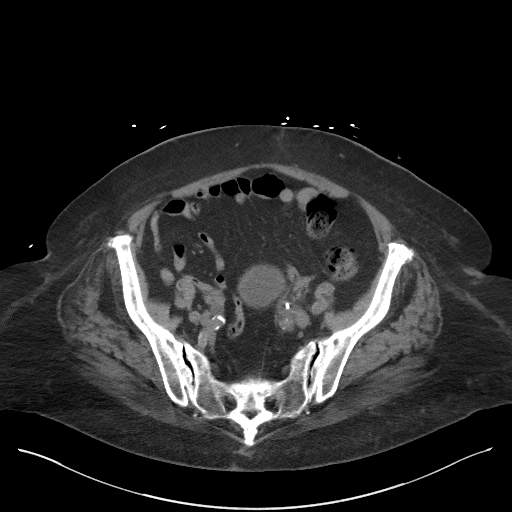
[im 36/85  soft-tissue]
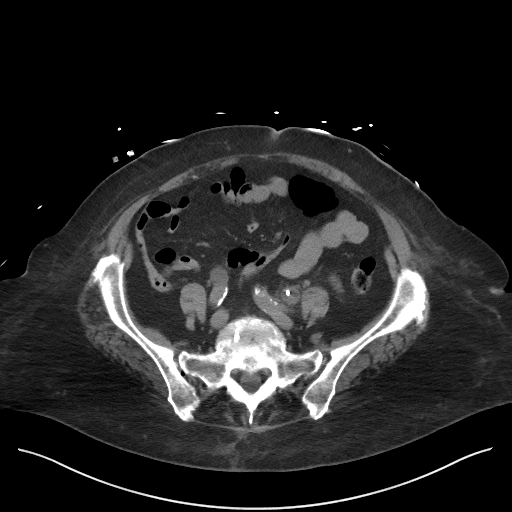
[im 43/85  soft-tissue]
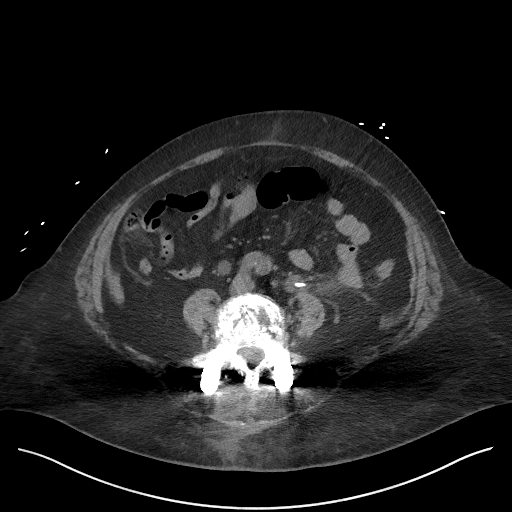
[im 49/85  soft-tissue]
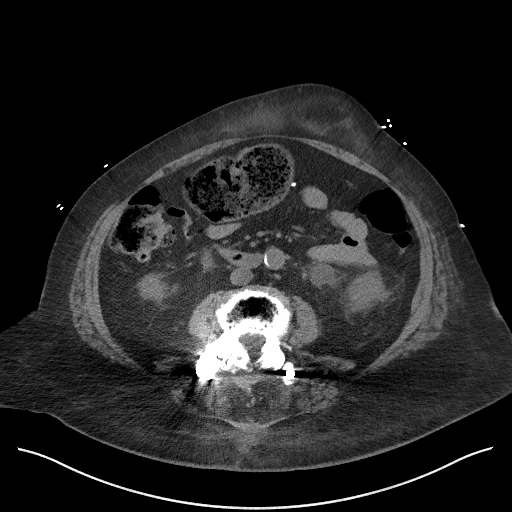
[im 55/85  soft-tissue]
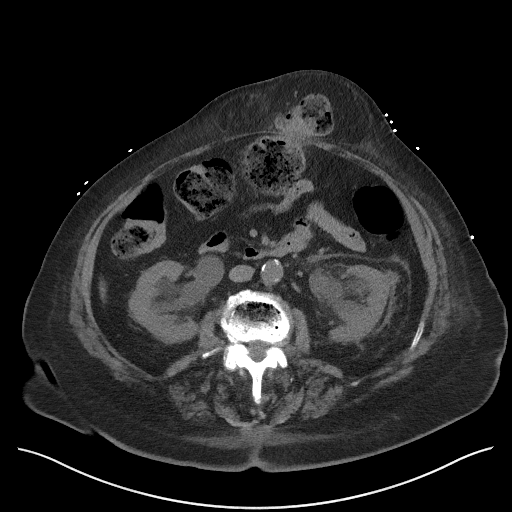
[im 55/85  bone]
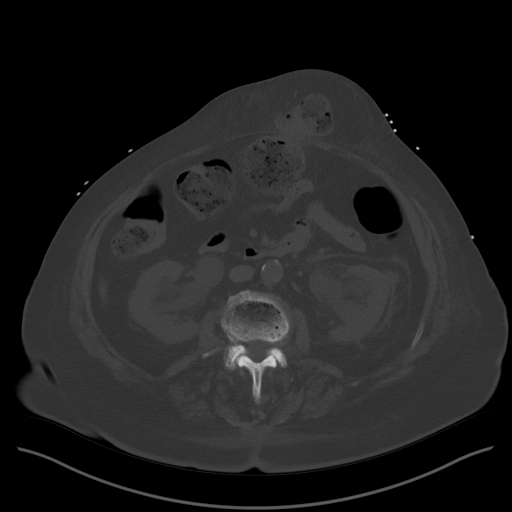
[im 62/85  soft-tissue]
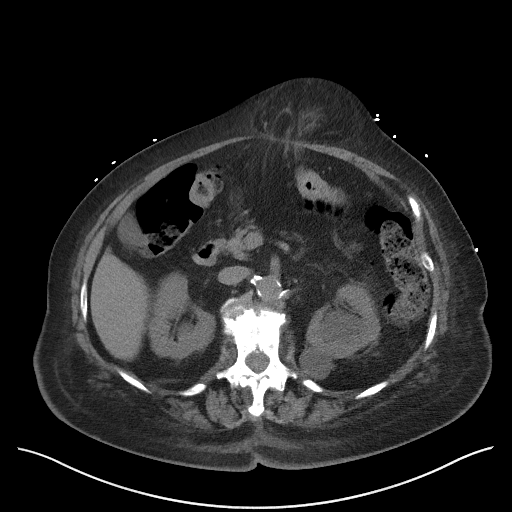
[im 68/85  soft-tissue]
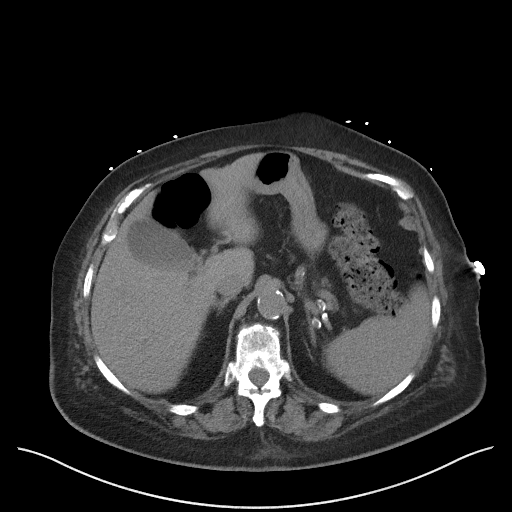
[im 75/85  soft-tissue]
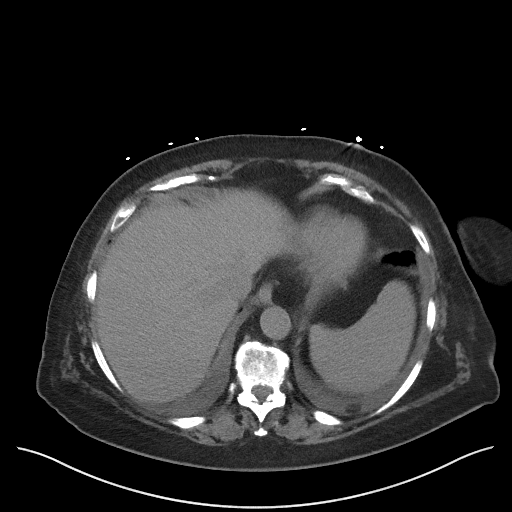
[im 81/85  soft-tissue]
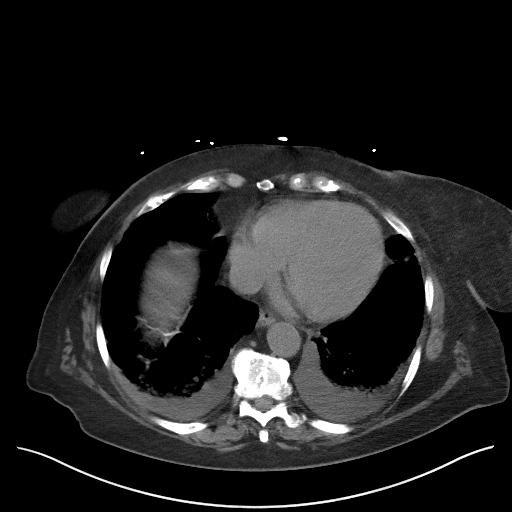

[Series 5: coronal st · coronal · 0.76mm/px · 3 of 108 slices shown]
[im 36/108  soft-tissue]
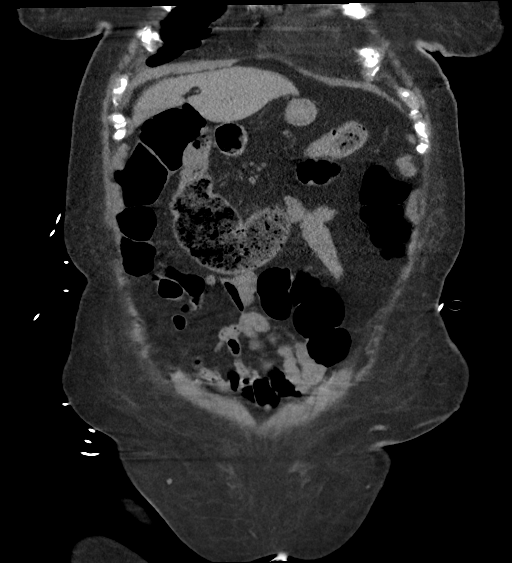
[im 48/108  soft-tissue]
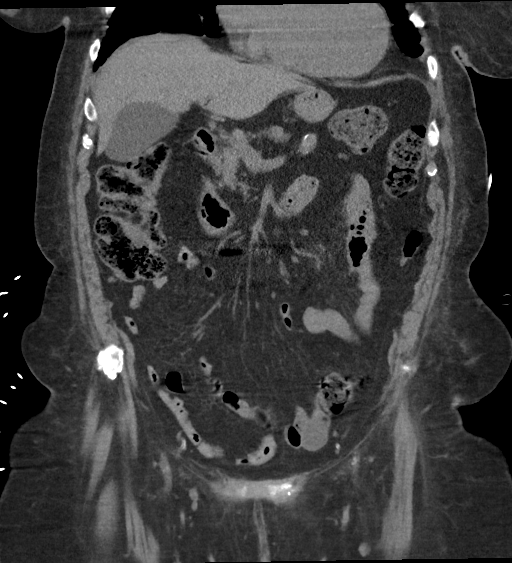
[im 60/108  soft-tissue]
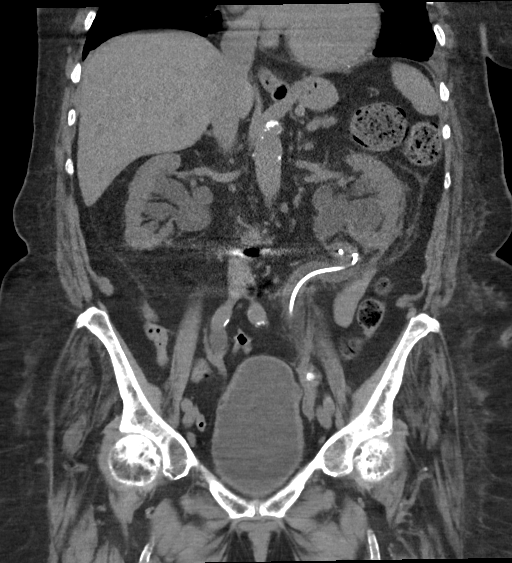

[16 of 46 positions shown; findings below may reference images not displayed]

FINDINGS: Lower chest: Small bilateral pleural effusions are identified with
mild interlobular septal thickening concerning for CHF.

Hepatobiliary: No focal liver abnormality is seen. No gallstones,
gallbladder wall thickening, or biliary dilatation.

Pancreas: Unremarkable. No pancreatic ductal dilatation or
surrounding inflammatory changes.

Spleen: Normal in size without focal abnormality.

Adrenals/Urinary Tract: Normal appearance of the adrenal glands.
Exophytic cyst arises off the posteromedial cortex of the upper pole
of left kidney measuring 2.6 cm. Moderate bilateral hydronephrosis
and hydroureter is identified. There is a left-sided nephroureteral
stent in place with proximal pigtail distal to the UPJ, image 60/5.
The distal pigtail is in the urinary bladder. No signs of
obstructing stone. Diffuse bladder wall trabeculation is identified.

Stomach/Bowel: Stomach is normal. No small bowel wall thickening,
inflammation or distension. Large ventral midline hernia is
identified which contains fat as well as a loop of transverse colon,
image [DATE]. Signs of partial obstruction are identified with
transition to decreased caliber distal transverse colon, image [DATE]

Vascular/Lymphatic: Aortic atherosclerosis. No abdominopelvic
adenopathy.

Reproductive: Uterus and bilateral adnexa are unremarkable.

Other: No free fluid or fluid collections but. No signs of
pneumoperitoneum.

Musculoskeletal: Postop change within the lumbar spine from PLIF at
L4-5. Marked degenerative disc disease is noted at L1-2, L2-3 and
L3-4. No acute osseous findings identified.
IMPRESSION: 1. Large ventral midline hernia is identified which a partially
obstructed loop of transverse colon.
2. Persistent hydronephrosis status post placement of nephroureteral
stent. The proximal pigtail of the ureteral stent is within the
ureter, distal to the UPJ.
3. New right hydronephrosis and hydroureter to the level of the
urinary bladder. No obstructing stone or mass noted.
4. Distended urinary bladder with diffuse bladder wall thickening.
Suspect chronic bladder outlet obstruction.
5. Small bilateral pleural effusions with mild interlobular septal
thickening concerning for CHF.
6. Aortic Atherosclerosis ([id number]).

## 2022-01-07 DIAGNOSIS — Z79891 Long term (current) use of opiate analgesic: Secondary | ICD-10-CM | POA: Diagnosis not present

## 2022-01-07 DIAGNOSIS — N184 Chronic kidney disease, stage 4 (severe): Secondary | ICD-10-CM | POA: Diagnosis not present

## 2022-01-07 DIAGNOSIS — E1122 Type 2 diabetes mellitus with diabetic chronic kidney disease: Secondary | ICD-10-CM | POA: Diagnosis not present

## 2022-01-07 DIAGNOSIS — Z794 Long term (current) use of insulin: Secondary | ICD-10-CM | POA: Diagnosis not present

## 2022-01-14 DIAGNOSIS — E1122 Type 2 diabetes mellitus with diabetic chronic kidney disease: Secondary | ICD-10-CM | POA: Diagnosis not present

## 2022-01-14 DIAGNOSIS — Z79891 Long term (current) use of opiate analgesic: Secondary | ICD-10-CM | POA: Diagnosis not present

## 2022-01-14 DIAGNOSIS — D509 Iron deficiency anemia, unspecified: Secondary | ICD-10-CM | POA: Diagnosis not present

## 2022-01-14 DIAGNOSIS — Z794 Long term (current) use of insulin: Secondary | ICD-10-CM | POA: Diagnosis not present

## 2022-01-14 DIAGNOSIS — D6869 Other thrombophilia: Secondary | ICD-10-CM | POA: Diagnosis not present

## 2022-01-14 DIAGNOSIS — I10 Essential (primary) hypertension: Secondary | ICD-10-CM | POA: Diagnosis not present

## 2022-01-14 DIAGNOSIS — I480 Paroxysmal atrial fibrillation: Secondary | ICD-10-CM | POA: Diagnosis not present

## 2022-01-14 DIAGNOSIS — E7849 Other hyperlipidemia: Secondary | ICD-10-CM | POA: Diagnosis not present

## 2022-01-14 DIAGNOSIS — N184 Chronic kidney disease, stage 4 (severe): Secondary | ICD-10-CM | POA: Diagnosis not present

## 2022-01-14 DIAGNOSIS — I5022 Chronic systolic (congestive) heart failure: Secondary | ICD-10-CM | POA: Diagnosis not present

## 2022-01-14 DIAGNOSIS — M48061 Spinal stenosis, lumbar region without neurogenic claudication: Secondary | ICD-10-CM | POA: Diagnosis not present

## 2022-02-04 ENCOUNTER — Other Ambulatory Visit: Payer: Self-pay

## 2022-02-04 ENCOUNTER — Ambulatory Visit: Payer: PPO | Admitting: Urology

## 2022-02-04 ENCOUNTER — Other Ambulatory Visit: Payer: Self-pay | Admitting: Urology

## 2022-02-04 ENCOUNTER — Encounter: Payer: Self-pay | Admitting: Urology

## 2022-02-04 VITALS — Ht 63.0 in | Wt 194.0 lb

## 2022-02-04 DIAGNOSIS — R339 Retention of urine, unspecified: Secondary | ICD-10-CM

## 2022-02-04 DIAGNOSIS — N1339 Other hydronephrosis: Secondary | ICD-10-CM

## 2022-02-04 LAB — BLADDER SCAN AMB NON-IMAGING

## 2022-02-04 NOTE — Progress Notes (Signed)
° °  02/04/2022 4:31 PM   Tyonna Talerico [birth year] [id number]  Reason for visit: Follow up hydronephrosis, incomplete bladder emptying, CKD   HPI: 88 year old female admitted in November 2022 for shortness of breath and CHF exacerbation and rising troponins.  A CT was performed for evaluation of a ventral hernia to evaluate for incarceration and showed bilateral hydroureteronephrosis down to a distended bladder with a left ureteral stent in place.  She underwent a diagnostic ureteroscopy in December 2021 that showed no evidence of obstruction and a stent was placed, and her renal function improved.   I suspect that she has a component of incomplete bladder emptying that is causing reflux/obstruction and the intermittent hydronephrosis.  I previously recommended a renal ultrasound with a Foley in place, but her husband removed her Foley catheter at home prior to having the ultrasound done.  Renal function is essentially at baseline with recent eGFR 22 from 25 previously.  She denies any urinary symptoms today, and actually feels like she has been urinating better since discharge from the hospital.  She has urgent continence at baseline, and likely a component of incomplete emptying his bladder scan today is 329 mL and last void was about 45 minutes ago.  We reviewed the need to change out her stent or have the stent removed.  She is very averse to stent removal and is concerned her renal function may worsen if the stent is taken out.  She has an upcoming visit with cardiology, and they are interested in scheduling stent exchange for April 2023.  I think this is reasonable.  She is also on prophylactic Keflex from her PCP.  Plan for left ureteral stent change in OR in April 2023   Billey Co, [facility] [address], [place], [place] [address] [phone]

## 2022-02-04 NOTE — Progress Notes (Signed)
Surgical Physician Order Form [facility]  * Scheduling expectation : April 2023  *Length of Case: 30 minutes  *Clearance needed: no  *Anticoagulation Instructions: May continue all anticoagulants  *Aspirin Instructions: Ok to continue all  *Post-op visit Date/Instructions: TBD  *Diagnosis: Left Hydronephrosis  *Procedure: left ureteral stent change   Additional orders: N/A  -Admit type: OUTpatient  -Anesthesia: General  -VTE Prophylaxis Standing Order SCDs       Other:   -Standing Lab Orders Per Anesthesia    Lab other: UA&Urine Culture  -Standing Test orders EKG/Chest x-ray per Anesthesia       Test other:   - Medications:  Ancef 2gm IV  -Other orders:  N/A

## 2022-02-04 NOTE — Patient Instructions (Signed)
will call to schedule stent change for April

## 2022-02-17 ENCOUNTER — Telehealth: Payer: Self-pay

## 2022-02-17 NOTE — Progress Notes (Signed)
[facility] Posting Form   Surgery Date/Time: Date: 04/10/2022  Surgeon: Dr. Nickolas Madrid, MD  Surgery Location: Day Surgery  Inpt ( No  )   Outpt (Yes)   Obs ( No  )   Diagnosis: Left Hydronephrosis N13.39  -CPT: 68088  Surgery: Cystoscopy with left ureteral Stent Change  Stop Anticoagulations: No, may continue ASA  Cardiac/Medical/Pulmonary Clearance needed: no   *Orders entered into EPIC  Date: 02/17/22   *Case booked in [place]  Date: 02/17/22  *Notified pt of Surgery: Date: 02/17/22  PRE-OP UA & CX: Yes, will obtain on 03/30/2022  *Placed into Prior Authorization Work Fabio Bering Date: 02/17/22   Assistant/laser/rep:No

## 2022-02-17 NOTE — Telephone Encounter (Signed)
I spoke with Mrs. Tiffany [name]. We have discussed possible surgery dates and Friday April 14th, 2023 was agreed upon by all parties. Patient given information about surgery date, what to expect pre-operatively and [name] operatively.   We discussed that a Pre-Admission Testing office will be calling to set up the pre-op visit that will take place prior to surgery, and that these appointments are typically done over the phone with a Pre-Admissions RN.   Informed patient that our office will communicate any additional care to be provided after surgery. Patients questions or concerns were discussed during our call. Advised to call our office should there be any additional information, questions or concerns that arise. Patient verbalized understanding.

## 2022-03-18 DIAGNOSIS — E782 Mixed hyperlipidemia: Secondary | ICD-10-CM | POA: Diagnosis not present

## 2022-03-18 DIAGNOSIS — I5022 Chronic systolic (congestive) heart failure: Secondary | ICD-10-CM | POA: Diagnosis not present

## 2022-03-18 DIAGNOSIS — I480 Paroxysmal atrial fibrillation: Secondary | ICD-10-CM | POA: Diagnosis not present

## 2022-03-18 DIAGNOSIS — I10 Essential (primary) hypertension: Secondary | ICD-10-CM | POA: Diagnosis not present

## 2022-03-30 ENCOUNTER — Other Ambulatory Visit: Payer: PPO

## 2022-03-30 DIAGNOSIS — N1339 Other hydronephrosis: Secondary | ICD-10-CM | POA: Diagnosis not present

## 2022-03-30 LAB — URINALYSIS, COMPLETE
Bilirubin, UA: NEGATIVE
Glucose, UA: NEGATIVE
Ketones, UA: NEGATIVE
Nitrite, UA: NEGATIVE
Specific Gravity, UA: 1.015 (ref 1.005–1.030)
Urobilinogen, Ur: 0.2 mg/dL (ref 0.2–1.0)
pH, UA: 6 (ref 5.0–7.5)

## 2022-03-30 LAB — MICROSCOPIC EXAMINATION: RBC, Urine: >30 /hpf — ABNORMAL HIGH (ref 0–2)

## 2022-04-01 ENCOUNTER — Telehealth: Payer: Self-pay | Admitting: Urology

## 2022-04-01 LAB — CULTURE, URINE COMPREHENSIVE

## 2022-04-01 NOTE — Telephone Encounter (Signed)
Husband called and Artha is having fever and UTI did come back positive. 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 Please advise patient. ?

## 2022-04-01 NOTE — Telephone Encounter (Signed)
Spoke with patient's husband, she has been having a fever with sore throat, denies urinary symptoms. Explained to seek medical attention if symptoms are worsening. Urine culture clear per Dr. Diamantina Providence ?

## 2022-04-02 ENCOUNTER — Other Ambulatory Visit
Admission: RE | Admit: 2022-04-02 | Discharge: 2022-04-02 | Disposition: A | Discharge status: Home / Self Care | Payer: PPO | Source: Ambulatory Visit | Attending: Urology | Admitting: Urology

## 2022-04-02 DIAGNOSIS — Z01812 Encounter for preprocedural laboratory examination: Secondary | ICD-10-CM

## 2022-04-02 HISTORY — DX: Heart failure, unspecified: I50.9

## 2022-04-02 HISTORY — DX: Unspecified urinary incontinence: R32

## 2022-04-02 HISTORY — DX: Myoneural disorder, unspecified: G70.9

## 2022-04-02 HISTORY — DX: Failed or difficult intubation, initial encounter: T88.4XXA

## 2022-04-02 NOTE — Patient Instructions (Addendum)
Your procedure is scheduled on: 04/10/22 - Friday ?Report to the Registration Desk on the 1st floor of the [facility]. ?To find out your arrival time, please call [phone] between 1PM - 3PM on: 04/09/22 - Thursday ? ?REMEMBER: ?Instructions that are not followed completely may result in serious medical risk, up to and including death; or upon the discretion of your surgeon and anesthesiologist your surgery may need to be rescheduled. ? ?Do not eat food or drink any fluids after midnight the night before surgery.  ?No gum chewing, lozengers or hard candies. ? ?TAKE THESE MEDICATIONS THE MORNING OF SURGERY WITH A SIP OF WATER: ? ?- amiodarone (PACERONE) 200 MG tablet ?- cephALEXin (KEFLEX) 500 MG capsule ?- metoprolol succinate (TOPROL-XL) 25 MG 24 hr tablet ? ?Do Not inject Insulin Glargine on the morning of your procedure. ? ?One week prior to surgery: ?Stop Anti-inflammatories (NSAIDS) such as Advil, Aleve, Ibuprofen, Motrin, Naproxen, Naprosyn and Aspirin based products such as Excedrin, Goodys Powder, BC Powder. ? ?Stop ANY OVER THE COUNTER supplements until after surgery. ? ?You may however, continue to take Tylenol if needed for pain up until the day of surgery. ? ?No Alcohol for 24 hours before or after surgery. ? ?No Smoking including e-cigarettes for 24 hours prior to surgery.  ?No chewable tobacco products for at least 6 hours prior to surgery.  ?No nicotine patches on the day of surgery. ? ?Do not use any "recreational" drugs for at least a week prior to your surgery.  ?Please be advised that the combination of cocaine and anesthesia may have negative outcomes, up to and including death. ?If you test positive for cocaine, your surgery will be cancelled. ? ?On the morning of surgery brush your teeth with toothpaste and water, you may rinse your mouth with mouthwash if you wish. ?Do not swallow any toothpaste or mouthwash. ? ?Do not wear jewelry, make-up, hairpins, clips or nail polish. ? ?Do not  wear lotions, powders, or perfumes.  ? ?Do not shave body from the neck down 48 hours prior to surgery just in case you cut yourself which could leave a site for infection.  ?Also, freshly shaved skin may become irritated if using the CHG soap. ? ?Contact lenses, hearing aids and dentures may not be worn into surgery. ? ?Do not bring valuables to the hospital. [facility] is not responsible for any missing/lost belongings or valuables.  ? ?Notify your doctor if there is any change in your medical condition (cold, fever, infection). ? ?Wear comfortable clothing (specific to your surgery type) to the hospital. ? ?After surgery, you can help prevent lung complications by doing breathing exercises.  ?Take deep breaths and cough every 1-2 hours. Your doctor may order a device called an Incentive Spirometer to help you take deep breaths. ?When coughing or sneezing, hold a pillow firmly against your incision with both hands. This is called ?splinting.? Doing this helps protect your incision. It also decreases belly discomfort. ? ?If you are being admitted to the hospital overnight, leave your suitcase in the car. ?After surgery it may be brought to your room. ? ?If you are being discharged the day of surgery, you will not be allowed to drive home. ?You will need a responsible adult (18 years or older) to drive you home and stay with you that night.  ? ?If you are taking public transportation, you will need to have a responsible adult (18 years or older) with you. ?Please confirm with your physician that  it is acceptable to use public transportation.  ? ?Please call the [facility] Dept. at [phone] if you have any questions about these instructions. ? ?Surgery Visitation Policy: ? ?Patients undergoing a surgery or procedure may have two family members or support persons with them as long as the person is not COVID-19 positive or experiencing its symptoms.  ? ?Inpatient Visitation:   ? ?Visiting hours are 7  a.m. to 8 p.m. ?Up to four visitors are allowed at one time in a patient room, including children. The visitors may rotate out with other people during the day. One designated support person (adult) may remain overnight.  ?

## 2022-04-02 NOTE — Pre-Procedure Instructions (Signed)
Pre admission phone call completed with patient, patient has cough with congestion but denies fever today. She reports that she is treating with Tylenol and cough drops. This Probation officer voiced the need to get a Covid test and contact her PCP for worsening or non- improvement of symptoms as her procedure is coming up.Patient also reports to this writer that she had a difficult intubation due to her having cervical fusion, she reports severe pain that lasted for eight days after her last procedure. She report that she does not want to be intubation in the future. ?

## 2022-04-02 NOTE — Pre-Procedure Instructions (Signed)
Cardiac clearance on chart from Dr Corky Sox. ?He is requesting that moderate sedation be used for this urologic procedure ?  ?

## 2022-04-02 NOTE — Pre-Procedure Instructions (Signed)
ECG 12-lead ? ?Component 2 wk ago  ?Vent Rate (bpm)  55   ?PR Interval (msec)  184   ?QRS Interval (msec)  148   ?QT Interval (msec)  482   ?QTc (msec)  461   ?Resulting Agency [facility] GE [facility]USE RESULTS  ?Narrative ?Performed by [facility] ?This result has an attachment that is not available.  ?Sinus bradycardia  ?Left axis deviation  ?Left bundle branch block  ?Abnormal ECG  ?When compared with ECG of 27-Oct-2021 09:06,  ?PR interval has decreased  ?QT has shortened  ?I reviewed and concur with this report. Electronically signed [email or web address], Ryan ([id number]) on 04/01/2022 8:27:02 AM ?Specimen Collected: 03/18/22 10:44 Last Resulted: 03/18/22   ? ?

## 2022-04-06 ENCOUNTER — Encounter
Admission: RE | Admit: 2022-04-06 | Discharge: 2022-04-06 | Disposition: A | Discharge status: Home / Self Care | Payer: PPO | Source: Ambulatory Visit | Attending: Urology | Admitting: Urology

## 2022-04-06 DIAGNOSIS — Z01812 Encounter for preprocedural laboratory examination: Secondary | ICD-10-CM | POA: Diagnosis not present

## 2022-04-06 LAB — BASIC METABOLIC PANEL
Anion gap: 8 (ref 5–15)
BUN: 34 mg/dL — ABNORMAL HIGH (ref 8–23)
CO2: 31 mmol/L (ref 22–32)
Calcium: 8.6 mg/dL — ABNORMAL LOW (ref 8.9–10.3)
Chloride: 102 mmol/L (ref 98–111)
Creatinine, Ser: 1.96 mg/dL — ABNORMAL HIGH (ref 0.44–1.00)
GFR, Estimated: 24 mL/min — ABNORMAL LOW (ref >60)
Glucose, Bld: 188 mg/dL — ABNORMAL HIGH (ref 70–99)
Potassium: 3.8 mmol/L (ref 3.5–5.1)
Sodium: 141 mmol/L (ref 135–145)

## 2022-04-09 MED ORDER — FAMOTIDINE 20 MG PO TABS: 20.0000 mg | ORAL_TABLET | Freq: Once | ORAL | Status: AC

## 2022-04-09 MED ORDER — CEFAZOLIN SODIUM-DEXTROSE 2-4 GM/100ML-% IV SOLN
2.0000 g | INTRAVENOUS | Status: AC
  Administered 2022-04-10: 2 g via INTRAVENOUS

## 2022-04-09 MED ORDER — CHLORHEXIDINE GLUCONATE 0.12 % MT SOLN: 15.0000 mL | Freq: Once | OROMUCOSAL | Status: AC

## 2022-04-09 MED ORDER — ORAL CARE MOUTH RINSE: 15.0000 mL | Freq: Once | OROMUCOSAL | Status: AC

## 2022-04-09 MED ORDER — SODIUM CHLORIDE 0.9 % IV SOLN: INTRAVENOUS | Status: DC

## 2022-04-10 ENCOUNTER — Ambulatory Visit: Payer: PPO

## 2022-04-10 ENCOUNTER — Encounter
Admission: RE | Disposition: A | Discharge status: Home / Self Care | Payer: Self-pay | Source: Home / Self Care | Attending: Urology

## 2022-04-10 ENCOUNTER — Encounter: Payer: Self-pay | Admitting: Urology

## 2022-04-10 ENCOUNTER — Ambulatory Visit
Admission: RE | Admit: 2022-04-10 | Discharge: 2022-04-10 | Disposition: A | Discharge status: Home / Self Care | Payer: PPO | Attending: Urology | Admitting: Urology

## 2022-04-10 ENCOUNTER — Ambulatory Visit: Payer: PPO | Admitting: Certified Registered Nurse Anesthetist

## 2022-04-10 ENCOUNTER — Other Ambulatory Visit: Payer: Self-pay

## 2022-04-10 DIAGNOSIS — I4891 Unspecified atrial fibrillation: Secondary | ICD-10-CM | POA: Insufficient documentation

## 2022-04-10 DIAGNOSIS — N1330 Unspecified hydronephrosis: Secondary | ICD-10-CM | POA: Insufficient documentation

## 2022-04-10 DIAGNOSIS — E1122 Type 2 diabetes mellitus with diabetic chronic kidney disease: Secondary | ICD-10-CM | POA: Insufficient documentation

## 2022-04-10 DIAGNOSIS — I509 Heart failure, unspecified: Secondary | ICD-10-CM | POA: Diagnosis not present

## 2022-04-10 DIAGNOSIS — I130 Hypertensive heart and chronic kidney disease with heart failure and stage 1 through stage 4 chronic kidney disease, or unspecified chronic kidney disease: Secondary | ICD-10-CM | POA: Insufficient documentation

## 2022-04-10 DIAGNOSIS — N189 Chronic kidney disease, unspecified: Secondary | ICD-10-CM | POA: Diagnosis not present

## 2022-04-10 DIAGNOSIS — N1339 Other hydronephrosis: Secondary | ICD-10-CM

## 2022-04-10 DIAGNOSIS — N131 Hydronephrosis with ureteral stricture, not elsewhere classified: Secondary | ICD-10-CM

## 2022-04-10 HISTORY — PX: CYSTOSCOPY W/ URETERAL STENT PLACEMENT: SHX1429

## 2022-04-10 LAB — GLUCOSE, CAPILLARY
Glucose-Capillary: 121 mg/dL — ABNORMAL HIGH (ref 70–99)
Glucose-Capillary: 129 mg/dL — ABNORMAL HIGH (ref 70–99)

## 2022-04-10 SURGERY — CYSTOSCOPY, FLEXIBLE, WITH STENT REPLACEMENT
Anesthesia: General | Site: Ureter | Laterality: Left

## 2022-04-10 MED ORDER — ONDANSETRON HCL 4 MG/2ML IJ SOLN
INTRAMUSCULAR | Status: DC | PRN
  Administered 2022-04-10: 4 mg via INTRAVENOUS

## 2022-04-10 MED ORDER — PHENYLEPHRINE HCL-NACL 20-0.9 MG/250ML-% IV SOLN
INTRAVENOUS | Status: AC
  Filled 2022-04-10: qty 250

## 2022-04-10 MED ORDER — FENTANYL CITRATE (PF) 100 MCG/2ML IJ SOLN
INTRAMUSCULAR | Status: AC
Start: 2022-04-10 — End: ?
  Filled 2022-04-10: qty 2

## 2022-04-10 MED ORDER — CHLORHEXIDINE GLUCONATE 0.12 % MT SOLN
OROMUCOSAL | Status: AC
  Administered 2022-04-10: 15 mL via OROMUCOSAL
  Filled 2022-04-10: qty 15

## 2022-04-10 MED ORDER — CEFAZOLIN SODIUM-DEXTROSE 2-4 GM/100ML-% IV SOLN
INTRAVENOUS | Status: AC
  Filled 2022-04-10: qty 100

## 2022-04-10 MED ORDER — IOHEXOL 180 MG/ML  SOLN
INTRAMUSCULAR | Status: DC | PRN
  Administered 2022-04-10: 5 mL

## 2022-04-10 MED ORDER — LABETALOL HCL 5 MG/ML IV SOLN
INTRAVENOUS | Status: DC | PRN
  Administered 2022-04-10: 10 mg via INTRAVENOUS

## 2022-04-10 MED ORDER — FENTANYL CITRATE (PF) 100 MCG/2ML IJ SOLN: 25.0000 ug | INTRAMUSCULAR | Status: DC | PRN

## 2022-04-10 MED ORDER — PROPOFOL 10 MG/ML IV BOLUS
INTRAVENOUS | Status: AC
  Filled 2022-04-10: qty 20

## 2022-04-10 MED ORDER — ONDANSETRON HCL 4 MG/2ML IJ SOLN: 4.0000 mg | Freq: Once | INTRAMUSCULAR | Status: DC | PRN

## 2022-04-10 MED ORDER — APREPITANT 40 MG PO CAPS
ORAL_CAPSULE | ORAL | Status: AC
  Administered 2022-04-10: 40 mg
  Filled 2022-04-10: qty 1

## 2022-04-10 MED ORDER — PROPOFOL 500 MG/50ML IV EMUL
INTRAVENOUS | Status: DC | PRN
  Administered 2022-04-10: 100 ug/kg/min via INTRAVENOUS

## 2022-04-10 MED ORDER — SODIUM CHLORIDE 0.9 % IR SOLN
Status: DC | PRN
  Administered 2022-04-10: 500 mL

## 2022-04-10 MED ORDER — FENTANYL CITRATE (PF) 100 MCG/2ML IJ SOLN
INTRAMUSCULAR | Status: DC | PRN
  Administered 2022-04-10: 25 ug via INTRAVENOUS

## 2022-04-10 MED ORDER — PROPOFOL 10 MG/ML IV BOLUS
INTRAVENOUS | Status: DC | PRN
  Administered 2022-04-10: 20 mg via INTRAVENOUS

## 2022-04-10 MED ORDER — FAMOTIDINE 20 MG PO TABS
ORAL_TABLET | ORAL | Status: AC
  Administered 2022-04-10: 20 mg via ORAL
  Filled 2022-04-10: qty 1

## 2022-04-10 MED ORDER — CEPHALEXIN 500 MG PO CAPS: 500.0000 mg | ORAL_CAPSULE | Freq: Every day | ORAL | 3 refills | Status: DC

## 2022-04-10 SURGICAL SUPPLY — 22 items
BAG DRAIN CYSTO-URO LG1000N (MISCELLANEOUS) ×2 IMPLANT
BRUSH SCRUB EZ 1% IODOPHOR (MISCELLANEOUS) ×1 IMPLANT
CATH URETL OPEN 5X70 (CATHETERS) ×2 IMPLANT
GAUZE 4X4 16PLY ~~LOC~~+RFID DBL (SPONGE) ×4 IMPLANT
GLOVE SURG UNDER POLY LF SZ7.5 (GLOVE) ×2 IMPLANT
GOWN STRL REUS W/ TWL LRG LVL3 (GOWN DISPOSABLE) ×1 IMPLANT
GOWN STRL REUS W/ TWL XL LVL3 (GOWN DISPOSABLE) ×1 IMPLANT
GOWN STRL REUS W/TWL LRG LVL3 (GOWN DISPOSABLE) ×2
GOWN STRL REUS W/TWL XL LVL3 (GOWN DISPOSABLE) ×2
GUIDEWIRE STR DUAL SENSOR (WIRE) ×2 IMPLANT
IV NS IRRIG 3000ML ARTHROMATIC (IV SOLUTION) ×2 IMPLANT
KIT TURNOVER CYSTO (KITS) ×2 IMPLANT
MANIFOLD NEPTUNE II (INSTRUMENTS) ×1 IMPLANT
PACK CYSTO AR (MISCELLANEOUS) ×2 IMPLANT
SET CYSTO W/LG BORE CLAMP LF (SET/KITS/TRAYS/PACK) ×2 IMPLANT
STENT URET 6FRX24 CONTOUR (STENTS) IMPLANT
STENT URET 6FRX26 CONTOUR (STENTS) IMPLANT
STENT URO INLAY 6FRX24CM (STENTS) ×1 IMPLANT
SURGILUBE 2OZ TUBE FLIPTOP (MISCELLANEOUS) ×2 IMPLANT
SYR TOOMEY IRRIG 70ML (MISCELLANEOUS)
SYRINGE TOOMEY IRRIG 70ML (MISCELLANEOUS) IMPLANT
WATER STERILE IRR 1000ML POUR (IV SOLUTION) ×2 IMPLANT

## 2022-04-10 NOTE — Anesthesia Postprocedure Evaluation (Signed)
Anesthesia Post Note ? ?Patient: Sudie Bandel ? ?Procedure(s) Performed: CYSTOSCOPY WITH STENT REPLACEMENT (Left: Ureter) ? ?Patient location during evaluation: PACU ?Anesthesia Type: General ?Level of consciousness: awake and alert ?Pain management: pain level controlled ?Vital Signs Assessment: post-procedure vital signs reviewed and stable ?Respiratory status: spontaneous breathing, nonlabored ventilation, respiratory function stable and patient connected to nasal cannula oxygen ?Cardiovascular status: blood pressure returned to baseline and stable ?Postop Assessment: no apparent nausea or vomiting ?Anesthetic complications: no ? ? ?No notable events documented. ? ? ?Last Vitals:  ?Vitals:  ? 04/10/22 0815 04/10/22 0830  ?BP: (!) 166/76 (!) 177/68  ?Pulse: (!) 54 (!) 52  ?Resp: 14 16  ?Temp:  (!) 36.3 ?C  ?SpO2: 98% 99%  ?  ?Last Pain:  ?Vitals:  ? 04/10/22 0830  ?TempSrc: Temporal  ?PainSc: 0-No pain  ? ? ?  ?  ?  ?  ?  ?  ? ?Molli Barrows ? ? ? ? ?

## 2022-04-10 NOTE — Anesthesia Procedure Notes (Signed)
Procedure Name: [place] ?Date/Time: 04/10/2022 7:40 AM ?Performed by: Lily Peer, Samantha Ragen, CRNA ?Pre-anesthesia Checklist: Patient identified, Emergency Drugs available, Timeout performed, Suction available and Patient being monitored ?Patient Re-evaluated:Patient Re-evaluated prior to induction ?Oxygen Delivery Method: Simple face mask ?Induction Type: IV induction ? ? ? ? ?

## 2022-04-10 NOTE — Op Note (Signed)
Date of procedure: 04/10/22 ? ?Preoperative diagnosis:  ?Left hydronephrosis ? ?Postoperative diagnosis:  ?Same ? ?Procedure: ?Cystoscopy, left retrograde pyelogram with intraoperative interpretation, left ureteral stent exchange ? ?Surgeon: Nickolas Madrid, MD ? ?Anesthesia: General ? ?Complications: None ? ?Intraoperative findings:  ?Normal appearing bladder, mild trabeculations, no suspicious lesions, ureteral orifices orthotopic bilaterally ?Mild left hydronephrosis on retrograde, uncomplicated stent exchange ? ?EBL: None ? ?Specimens: None ? ?Drains: Left 6 French by 24 cm Bard Optima stent ? ?Indication: Tiffany [name] is a 88 y.o. patient who previously presented with acute on chronic kidney injury and left-sided hydronephrosis and underwent stent placement.  Kidney function has improved with the ureteral stent in place, and she has tolerated this well.  After reviewing the management options for treatment, they elected to proceed with the above surgical procedure(s). We have discussed the potential benefits and risks of the procedure, side effects of the proposed treatment, the likelihood of the patient achieving the goals of the procedure, and any potential problems that might occur during the procedure or recuperation. Informed consent has been obtained. ? ?Description of procedure: ? ?The patient was taken to the operating room and MAC was induced. SCDs were placed for DVT prophylaxis. The patient was placed in the dorsal lithotomy position, prepped and draped in the usual sterile fashion, and preoperative antibiotics(Ancef) were administered. A preoperative time-out was performed.  ? ?A 21 French rigid cystoscope was used into the bladder and thorough cystoscopy was performed.  There were mild trabeculations, but no suspicious lesions.  The ureteral orifices were orthotopic bilaterally.  A sensor wire was advanced easily alongside the left ureteral stent up to the kidney under fluoroscopic vision.  The  stent was then grasped and removed.  There was no encrustation.  A 5 French access catheter was advanced over the wire to the mid ureter and a retrograde pyelogram showed only mild dilation of the left collecting system.  The wire was replaced and a 6 [place] by 24 cm Bard Optima stent was uneventfully placed with an excellent curl in the renal pelvis, as well as in the bladder.  Contrast drained through the side ports of the stent.  The bladder was drained and this concluded our procedure. ? ?Disposition: Stable to PACU ? ?Plan: ?Follow-up in clinic in 6 months for renal function and symptom check ?Okay to continue low-dose Keflex prophylaxis ?Anticipate stent changes every 12 to 18 months ? ?Nickolas Madrid, MD ? ?

## 2022-04-10 NOTE — Transfer of Care (Signed)
Immediate Anesthesia Transfer of Care Note ? ?Patient: Tiffany [name] ? ?Procedure(s) Performed: CYSTOSCOPY WITH STENT REPLACEMENT (Left: Ureter) ? ?Patient Location: PACU ? ?Anesthesia Type:MAC ? ?Level of Consciousness: awake, alert  and oriented ? ?Airway & Oxygen Therapy: Patient Spontanous Breathing ? ?Post-op Assessment: Report given to RN and Post -op Vital signs reviewed and stable ? ?Post vital signs: Reviewed and stable ? ?Last Vitals:  ?Vitals Value Taken Time  ?BP 117/60 04/10/22 0757  ?Temp    ?Pulse 54 04/10/22 0759  ?Resp 17 04/10/22 0759  ?SpO2 98 % 04/10/22 0759  ?Vitals shown include unvalidated device data. ? ?Last Pain:  ?Vitals:  ? 04/10/22 0629  ?TempSrc: Oral  ?PainSc: 0-No pain  ?   ? ?  ? ?Complications: No notable events documented. ?

## 2022-04-10 NOTE — Anesthesia Preprocedure Evaluation (Signed)
Anesthesia Evaluation  ?Patient identified by MRN, date of birth, ID band ?Patient awake ? ? ? ?Reviewed: ?Allergy & Precautions, H&P , NPO status , Patient's Chart, lab work & pertinent test results, reviewed documented beta blocker date and time  ? ?History of Anesthesia Complications ?(+) PONV, DIFFICULT AIRWAY and history of anesthetic complications ? ?Airway ?Mallampati: II ? ? ?Neck ROM: full ? ? ? Dental ? ?(+) Poor Dentition ?  ?Pulmonary ?shortness of breath,  ?  ?Pulmonary exam normal ? ? ? ? ? ? ? Cardiovascular ?Exercise Tolerance: Poor ?hypertension, On Medications ?+CHF  ?+ dysrhythmias Atrial Fibrillation  ?Rhythm:regular Rate:Normal ? ? ?  ?Neuro/Psych ? Neuromuscular disease negative psych ROS  ? GI/Hepatic ?negative GI ROS, Neg liver ROS,   ?Endo/Other  ?negative endocrine ROSdiabetes ? Renal/GU ?Renal disease  ?negative genitourinary ?  ?Musculoskeletal ? ? Abdominal ?  ?Peds ? Hematology ? ?(+) Blood dyscrasia, anemia ,   ?Anesthesia Other Findings ?Past Medical History: ?No date: A-fib [facility]) ?No date: Anemia ?No date: Arthritis ?No date: Cataracts, both eyes ?No date: CHF (congestive heart failure) ([place]) ?No date: Chickenpox ?No date: Chronic kidney disease ?No date: Complication of anesthesia ?No date: Diabetes mellitus without complication ([place]) ?No date: Difficult intubation ?    Comment:  patient reports that she has had a cervical fusion and  ?             reports that she had severe pain for 8 days after being  ?             intubated. ?No date: Dyspnea ?    Comment:  with exertion ?No date: Dysrhythmia ?No date: Hyperlipidemia ?No date: Hypertension ?No date: Incontinence in female ?    Comment:  wears pads ?No date: Neuromuscular disorder ([place]) ?    Comment:  sciatica ?No date: PONV (postoperative nausea and vomiting) ?    Comment:  with 1st pregnancy 50 years ago and no problem since  ?             then ?No date: Right leg weakness ?No date: Spinal  stenosis ?No date: UTI (urinary tract infection) ?Past Surgical History: ?No date: BACK SURGERY ?    Comment:  x2 -cervical fusion and mid-lower back surgery ?No date: CARDIOVERSION ?11/04/2021: CARDIOVERSION; N/A ?    Comment:  Procedure: CARDIOVERSION;  Surgeon: Teodoro Spray, MD; ?             Location: [facility] ORS;  Service: Cardiovascular;   ?             Laterality: N/A; ?No date: CATARACT EXTRACTION, BILATERAL ?1996: CERVICAL LAMINECTOMY ?12/06/2020: CYSTOSCOPY W/ RETROGRADES ?    Comment:  Procedure: CYSTOSCOPY WITH RETROGRADE PYELOGRAM;   ?             Surgeon: Billey Co, MD;  Location: [facility] ORS;   ?             Service: Urology;; ?12/06/2020: [place]; Left ?    Comment:  Procedure: CYSTOSCOPY WITH STENT PLACEMENT;  Surgeon:  ?             Billey Co, MD;  Location: [facility] ORS;  Service:  ?             Urology;  Laterality: Left; ?01/12/2017: ELECTROPHYSIOLOGIC STUDY; N/A ?    Comment:  Procedure: Cardioversion;  Surgeon: Teodoro Spray, MD;  ?  Location: [facility] ORS;  Service: Cardiovascular;   ?             Laterality: N/A; ?No date: EYE SURGERY ?No date: JOINT REPLACEMENT ?2010: SHOULDER SURGERY; Right ?    Comment:  rotator cuff repair ?No date: TONSILLECTOMY ?05/23/2019: TOTAL KNEE ARTHROPLASTY; Left ?    Comment:  Procedure: TOTAL KNEE ARTHROPLASTY - LEFT - DIABETIC;   ?             Surgeon: Corky Mull, MD;  Location: [facility] ORS;   ?             Service: Orthopedics;  Laterality: Left; ?12/06/2020: URETEROSCOPY; N/A ?    Comment:  Procedure: DIAGNOSTIC URETEROSCOPY;  Surgeon: Diamantina Providence,  ?             Herbert Seta, MD;  Location: [facility] ORS;  Service: Urology;   ?             Laterality: N/A; ?BMI   ? Body Mass Index: 35.85 kg/m?  ?  ? Reproductive/Obstetrics ?negative OB ROS ? ?  ? ? ? ? ? ? ? ? ? ? ? ? ? ?  ?  ? ? ? ? ? ? ? ? ?Anesthesia Physical ?Anesthesia Plan ? ?ASA: 3 ? ?Anesthesia Plan: General  ? ?Post-op Pain Management:   ? ?Induction:  ? ?PONV Risk Score  and Plan:  ? ?Airway Management Planned:  ? ?Additional Equipment:  ? ?Intra-op Plan:  ? ?Post-operative Plan:  ? ?Informed Consent: I have reviewed the patients History and Physical, chart, labs and discussed the procedure including the risks, benefits and alternatives for the proposed anesthesia with the patient or authorized representative who has indicated his/her understanding and acceptance.  ? ? ? ?Dental Advisory Given ? ?Plan Discussed with: CRNA ? ?Anesthesia Plan Comments:   ? ? ? ? ? ? ?Anesthesia Quick Evaluation ? ?

## 2022-04-10 NOTE — H&P (Signed)
? ?04/10/22 ?6:54 AM  ? ?Tiffany [name] ?July 20, 1933 ?[id number] ? ?CC: left hydro, CKD ? ?HPI: ? ?88 year old female admitted in November 2022 for shortness of breath and CHF exacerbation and rising troponins.  A CT was performed for evaluation of a ventral hernia to evaluate for incarceration and showed bilateral hydroureteronephrosis down to a distended bladder with a left ureteral stent in place.  She underwent a diagnostic ureteroscopy in December 2021 that showed no evidence of obstruction and a stent was placed, and her renal function improved. ?  ?I suspect that she has a component of incomplete bladder emptying that is causing reflux/obstruction and the intermittent hydronephrosis.  I previously recommended a renal ultrasound with a Foley in place, but her husband removed her Foley catheter at home prior to having the ultrasound done. ? ?  ?We reviewed the need to change out her stent or have the stent removed.  She is very averse to stent removal and is concerned her renal function may worsen if the stent is taken out.   ?  ? ?PMH: ?Past Medical History:  ?Diagnosis Date  ? A-fib ([place])   ? Anemia   ? Arthritis   ? Cataracts, both eyes   ? CHF (congestive heart failure) ([place])   ? Chickenpox   ? Chronic kidney disease   ? Complication of anesthesia   ? Diabetes mellitus without complication ()   ? Difficult intubation   ? patient reports that she has had a cervical fusion and reports that she had severe pain for 8 days after being intubated.  ? Dyspnea   ? with exertion  ? Dysrhythmia   ? Hyperlipidemia   ? Hypertension   ? Incontinence in female   ? wears pads  ? Neuromuscular disorder ([place])   ? sciatica  ? PONV (postoperative nausea and vomiting)   ? with 1st pregnancy 50 years ago and no problem since then  ? Right leg weakness   ? Spinal stenosis   ? UTI (urinary tract infection)   ? ? ?Surgical History: ?Past Surgical History:  ?Procedure Laterality Date  ? BACK SURGERY    ? x2 -cervical fusion and  mid-lower back surgery  ? CARDIOVERSION    ? CARDIOVERSION N/A 11/04/2021  ? Procedure: CARDIOVERSION;  Surgeon: Teodoro Spray, MD;  Location: [facility] ORS;  Service: Cardiovascular;  Laterality: N/A;  ? CATARACT EXTRACTION, BILATERAL    ? CERVICAL LAMINECTOMY  1996  ? CYSTOSCOPY W/ RETROGRADES  12/06/2020  ? Procedure: CYSTOSCOPY WITH RETROGRADE PYELOGRAM;  Surgeon: Billey Co, MD;  Location: [facility] ORS;  Service: Urology;;  ? [place] Left 12/06/2020  ? Procedure: CYSTOSCOPY WITH STENT PLACEMENT;  Surgeon: Billey Co, MD;  Location: [facility] ORS;  Service: Urology;  Laterality: Left;  ? ELECTROPHYSIOLOGIC STUDY N/A 01/12/2017  ? Procedure: Cardioversion;  Surgeon: Teodoro Spray, MD;  Location: [facility] ORS;  Service: Cardiovascular;  Laterality: N/A;  ? EYE SURGERY    ? JOINT REPLACEMENT    ? SHOULDER SURGERY Right 2010  ? rotator cuff repair  ? TONSILLECTOMY    ? TOTAL KNEE ARTHROPLASTY Left 05/23/2019  ? Procedure: TOTAL KNEE ARTHROPLASTY - LEFT - DIABETIC;  Surgeon: Corky Mull, MD;  Location: [facility] ORS;  Service: Orthopedics;  Laterality: Left;  ? URETEROSCOPY N/A 12/06/2020  ? Procedure: DIAGNOSTIC URETEROSCOPY;  Surgeon: Billey Co, MD;  Location: [facility] ORS;  Service: Urology;  Laterality: N/A;  ? ? ? ?Family History: ?Family History  ?Adopted:  Yes  ? ? ?Social History:  reports that she has never smoked. She has never been exposed to tobacco smoke. She has never used smokeless tobacco. She reports that she does not drink alcohol and does not use drugs. ? ?Physical Exam: ?Pulse (!) 56   Temp 97.7 ?F (36.5 ?C) (Oral)   Resp 18   Ht 5\' 2"  (1.575 m)   Wt 88.9 kg   SpO2 97%   BMI 35.85 kg/m?   ? ?Constitutional:  Alert and oriented, No acute distress. ?Cardiovascular: RRR ?Respiratory: CTA, diminished at bases ?GI: Abdomen is soft, nontender, nondistended, no abdominal masses ? ? ?Laboratory Data: ?Urine culture 4/3 10-25k mixed flora ? ?Assessment & Plan:   ?Comorbid  88 year old female with CKD and left-sided hydronephrosis with prior negative diagnostic ureteroscopy and stent was placed with some improvement in her renal function.  We have previously discussed at length possible etiologies including reflux from incomplete emptying versus incomplete draining of the left kidney.  We discussed options including stent removal and observation versus stent replacement.  They are very averse to stent removal, and would like to continue chronic left ureteral stent.  Risks and benefits discussed extensively including bleeding, infection, UTI/sepsis, worsening renal function from reflux. ? ?Cystoscopy and left ureteral stent change ? ?Nickolas Madrid, MD ?04/10/2022 ? ?[facility] ?[address], [address] ?[place], [place] [address] ?[phone] ? ? ?

## 2022-04-10 NOTE — Discharge Instructions (Signed)
AMBULATORY SURGERY  ?DISCHARGE INSTRUCTIONS ? ? ?The drugs that you were given will stay in your system until tomorrow so for the next 24 hours you should not: ? ?Drive an automobile ?Make any legal decisions ?Drink any alcoholic beverage ? ? ?You may resume regular meals tomorrow.  Today it is better to start with liquids and gradually work up to solid foods. ? ?You may eat anything you prefer, but it is better to start with liquids, then soup and crackers, and gradually work up to solid foods. ? ? ?Please notify your doctor immediately if you have any unusual bleeding, trouble breathing, redness and pain at the surgery site, drainage, fever, or pain not relieved by medication. ? ? ? ?Additional Instructions: ? ? ? ?Please contact your physician with any problems or Same Day Surgery at [phone], Monday through Friday 6 am to 4 pm, or [facility] Main number at [phone].  ?

## 2022-05-06 DIAGNOSIS — M48061 Spinal stenosis, lumbar region without neurogenic claudication: Secondary | ICD-10-CM | POA: Diagnosis not present

## 2022-05-06 DIAGNOSIS — Z794 Long term (current) use of insulin: Secondary | ICD-10-CM | POA: Diagnosis not present

## 2022-05-06 DIAGNOSIS — N184 Chronic kidney disease, stage 4 (severe): Secondary | ICD-10-CM | POA: Diagnosis not present

## 2022-05-06 DIAGNOSIS — E7849 Other hyperlipidemia: Secondary | ICD-10-CM | POA: Diagnosis not present

## 2022-05-06 DIAGNOSIS — E1122 Type 2 diabetes mellitus with diabetic chronic kidney disease: Secondary | ICD-10-CM | POA: Diagnosis not present

## 2022-05-13 DIAGNOSIS — Z79891 Long term (current) use of opiate analgesic: Secondary | ICD-10-CM | POA: Diagnosis not present

## 2022-05-13 DIAGNOSIS — I5022 Chronic systolic (congestive) heart failure: Secondary | ICD-10-CM | POA: Diagnosis not present

## 2022-05-13 DIAGNOSIS — I10 Essential (primary) hypertension: Secondary | ICD-10-CM | POA: Diagnosis not present

## 2022-05-13 DIAGNOSIS — Z794 Long term (current) use of insulin: Secondary | ICD-10-CM | POA: Diagnosis not present

## 2022-05-13 DIAGNOSIS — E782 Mixed hyperlipidemia: Secondary | ICD-10-CM | POA: Diagnosis not present

## 2022-05-13 DIAGNOSIS — N184 Chronic kidney disease, stage 4 (severe): Secondary | ICD-10-CM | POA: Diagnosis not present

## 2022-05-13 DIAGNOSIS — E1122 Type 2 diabetes mellitus with diabetic chronic kidney disease: Secondary | ICD-10-CM | POA: Diagnosis not present

## 2022-05-13 DIAGNOSIS — D6869 Other thrombophilia: Secondary | ICD-10-CM | POA: Diagnosis not present

## 2022-05-13 DIAGNOSIS — I480 Paroxysmal atrial fibrillation: Secondary | ICD-10-CM | POA: Diagnosis not present

## 2022-08-05 DIAGNOSIS — N184 Chronic kidney disease, stage 4 (severe): Secondary | ICD-10-CM | POA: Diagnosis not present

## 2022-08-05 DIAGNOSIS — Z79891 Long term (current) use of opiate analgesic: Secondary | ICD-10-CM | POA: Diagnosis not present

## 2022-08-05 DIAGNOSIS — Z794 Long term (current) use of insulin: Secondary | ICD-10-CM | POA: Diagnosis not present

## 2022-08-05 DIAGNOSIS — I480 Paroxysmal atrial fibrillation: Secondary | ICD-10-CM | POA: Diagnosis not present

## 2022-08-05 DIAGNOSIS — E1122 Type 2 diabetes mellitus with diabetic chronic kidney disease: Secondary | ICD-10-CM | POA: Diagnosis not present

## 2022-08-05 DIAGNOSIS — D6869 Other thrombophilia: Secondary | ICD-10-CM | POA: Diagnosis not present

## 2022-08-05 DIAGNOSIS — E782 Mixed hyperlipidemia: Secondary | ICD-10-CM | POA: Diagnosis not present

## 2022-08-12 DIAGNOSIS — Z0000 Encounter for general adult medical examination without abnormal findings: Secondary | ICD-10-CM | POA: Diagnosis not present

## 2022-08-12 DIAGNOSIS — I480 Paroxysmal atrial fibrillation: Secondary | ICD-10-CM | POA: Diagnosis not present

## 2022-08-12 DIAGNOSIS — Z79891 Long term (current) use of opiate analgesic: Secondary | ICD-10-CM | POA: Diagnosis not present

## 2022-08-12 DIAGNOSIS — D6869 Other thrombophilia: Secondary | ICD-10-CM | POA: Diagnosis not present

## 2022-08-12 DIAGNOSIS — E1122 Type 2 diabetes mellitus with diabetic chronic kidney disease: Secondary | ICD-10-CM | POA: Diagnosis not present

## 2022-08-12 DIAGNOSIS — I10 Essential (primary) hypertension: Secondary | ICD-10-CM | POA: Diagnosis not present

## 2022-08-12 DIAGNOSIS — D649 Anemia, unspecified: Secondary | ICD-10-CM | POA: Diagnosis not present

## 2022-08-12 DIAGNOSIS — I5022 Chronic systolic (congestive) heart failure: Secondary | ICD-10-CM | POA: Diagnosis not present

## 2022-08-12 DIAGNOSIS — N184 Chronic kidney disease, stage 4 (severe): Secondary | ICD-10-CM | POA: Diagnosis not present

## 2022-08-12 DIAGNOSIS — E782 Mixed hyperlipidemia: Secondary | ICD-10-CM | POA: Diagnosis not present

## 2022-08-12 DIAGNOSIS — M5416 Radiculopathy, lumbar region: Secondary | ICD-10-CM | POA: Diagnosis not present

## 2022-08-17 ENCOUNTER — Ambulatory Visit: Payer: Self-pay | Admitting: Surgery

## 2022-08-17 DIAGNOSIS — K436 Other and unspecified ventral hernia with obstruction, without gangrene: Secondary | ICD-10-CM | POA: Diagnosis not present

## 2022-08-21 ENCOUNTER — Ambulatory Visit: Payer: Self-pay | Admitting: Surgery

## 2022-08-21 ENCOUNTER — Encounter: Payer: Self-pay | Admitting: Surgery

## 2022-08-21 ENCOUNTER — Encounter
Admission: RE | Admit: 2022-08-21 | Discharge: 2022-08-21 | Disposition: A | Discharge status: Home / Self Care | Payer: PPO | Source: Ambulatory Visit | Attending: Surgery | Admitting: Surgery

## 2022-08-21 ENCOUNTER — Other Ambulatory Visit: Payer: Self-pay

## 2022-08-21 VITALS — Ht 62.0 in | Wt 198.0 lb

## 2022-08-21 DIAGNOSIS — E119 Type 2 diabetes mellitus without complications: Secondary | ICD-10-CM

## 2022-08-21 DIAGNOSIS — Z01812 Encounter for preprocedural laboratory examination: Secondary | ICD-10-CM

## 2022-08-21 HISTORY — DX: Long term (current) use of anticoagulants: Z79.01

## 2022-08-21 NOTE — Patient Instructions (Addendum)
Your procedure is scheduled on: August 27, 2022 THURSDAY Report to the Registration Desk on the 1st floor of the [facility]. To find out your arrival time, please call [phone] between [place] on: WEDNESDAY August 26, 2022 If your arrival time is 6:00 am, do not arrive prior to that time as the [facility] entrance doors do not open until 6:00 am.  REMEMBER: Instructions that are not followed completely may result in serious medical risk, up to and including death; or upon the discretion of your surgeon and anesthesiologist your surgery may need to be rescheduled.  Do not eat food after midnight the night before surgery.  No gum chewing, lozengers or hard candies.  You may however, drink CLEAR liquids up to 2 hours before you are scheduled to arrive for your surgery. Do not drink anything within 2 hours of your scheduled arrival time. Type 1 and Type 2 diabetics should only drink water.    TAKE THESE MEDICATIONS THE MORNING OF SURGERY WITH A SIP OF WATER: AMIODARONE METOPROLOL  NO INSULIN MORNING OF SURGERY.  STOP XARELTO 3-5 DAYS BEFORE SURGERY. LAST DOSE August 21, 2022  One week prior to surgery: Stop Anti-inflammatories (NSAIDS) such as Advil, Aleve, Ibuprofen, Motrin, Naproxen, Naprosyn and Aspirin based products such as Excedrin, Goodys Powder, BC Powder. Stop ANY OVER THE COUNTER supplements until after surgery. You may however, continue to take Tylenol if needed for pain up until the day of surgery.  No Alcohol for 24 hours before or after surgery.  No Smoking including e-cigarettes for 24 hours prior to surgery.  No chewable tobacco products for at least 6 hours prior to surgery.  No nicotine patches on the day of surgery.  Do not use any "recreational" drugs for at least a week prior to your surgery.  Please be advised that the combination of cocaine and anesthesia may have negative outcomes, up to and including death. If you test positive for cocaine, your  surgery will be cancelled.  On the morning of surgery brush your teeth with toothpaste and water, you may rinse your mouth with mouthwash if you wish. Do not swallow any toothpaste or mouthwash.  Use CHG Soap as directed on instruction sheet.  Do not wear jewelry, make-up, hairpins, clips or nail polish.  Do not wear lotions, powders, or perfumes OR DEODORANT   Do not shave body from the neck down 48 hours prior to surgery just in case you cut yourself which could leave a site for infection.  Also, freshly shaved skin may become irritated if using the CHG soap.  Contact lenses, hearing aids and dentures may not be worn into surgery.  Do not bring valuables to the hospital. [facility] is not responsible for any missing/lost belongings or valuables.   Notify your doctor if there is any change in your medical condition (cold, fever, infection).  Wear comfortable clothing (specific to your surgery type) to the hospital.  After surgery, you can help prevent lung complications by doing breathing exercises.  Take deep breaths and cough every 1-2 hours. Your doctor may order a device called an Incentive Spirometer to help you take deep breaths. When coughing or sneezing, hold a pillow firmly against your incision with both hands. This is called "splinting." Doing this helps protect your incision. It also decreases belly discomfort.   If you are being discharged the day of surgery, you will not be allowed to drive home. You will need a responsible adult (18 years or  older) to drive you home and stay with you that night.   If you are taking public transportation, you will need to have a responsible adult (18 years or older) with you. Please confirm with your physician that it is acceptable to use public transportation.   Please call the [facility] Dept. at [phone] if you have any questions about these instructions.  Surgery Visitation Policy:  Patients undergoing a  surgery or procedure may have two family members or support persons with them as long as the person is not COVID-19 positive or experiencing its symptoms.

## 2022-08-21 NOTE — H&P (Signed)
Subjective:   CC: Ventral hernia with obstruction and without gangrene [K43.6]  HPI: Tiffany [name] is a 88 y.o. female who returns for evaluation of above. Symptoms were first noted several years ago. Very mild discomfort, worse with eating. Associated with nothing, exacerbated by nothing. Lump is reducible.   Past Medical History: has a past medical history of Anemia, Atrial fibrillation (CMS-HCC), Bilateral cataracts, Chickenpox, Chronic kidney disease, stage III, DOE (dyspnea on exertion) (02/04/2015), Hyperlipidemia, Hypertension, Osteoarthritis, Right leg weakness, Spinal stenosis, Type 2 diabetes mellitus (CMS-HCC), and Urinary tract bacterial infections.  Past Surgical History:  Past Surgical History:  Procedure Laterality Date  BACK SURGERY 02/03/2010  TRANSVERSE LUMBAR INTERBODY FUSION, POSTEROLATERAL FUSION L4-5 AND l2-3  Left TKA using all cemented biomet vanguard system with a 70 mm PCR femur a 75 mm tibial tray with a 10 mm anterior e-poly insert and a 34 x 8.5 mm all poly 3 -pegged domed patella Left 05/23/2019  Dr.Poggi  ANTERIOR FUSION CERVICAL SPINE  CARDIOVERSION EXTERNAL  CATARACT EXTRACTION Bilateral  Cervical laminectomy  SHOULDER ARTHROSCOPY DISTAL CLAVICLE EXCISION AND OPEN ROTATOR CUFF REPAIR  SPINE SURGERY   Family History: She was adopted. Family history is unknown by patient.  Social History: reports that she has never smoked. She has never used smokeless tobacco. She reports that she does not drink alcohol and does not use drugs.  Current Medications: has a current medication list which includes the following prescription(s): acetaminophen, proair respiclick, amiodarone, cephalexin, toujeo solostar u-300 insulin, glimepiride, hydrocodone-acetaminophen, insulin aspart, metoprolol succinate, pen needle, diabetic, polyethylene glycol, rivaroxaban, torsemide, and blood glucose diagnostic.  Allergies:  Allergies as of 08/17/2022 - Reviewed 08/17/2022  Allergen  Reaction Noted  Amlodipine Swelling 05/07/2014  Codeine Unknown 05/07/2014  Doxycycline Vomiting 02/16/2018  Tizanidine Unknown 10/14/2020   ROS:  A 15 point review of systems was performed and pertinent positives and negatives noted in HPI  Objective:    BP (!) 192/78  Pulse 59  Ht 157.5 cm (5\' 2" )  Wt 88.5 kg (195 lb)  BMI 35.67 kg/m   Constitutional : Alert, cooperative, no distress  Lymphatics/Throat: Supple, no lymphadenopathy  Respiratory: clear to auscultation bilaterally  Cardiovascular: regular rate and rhythm  Gastrointestinal: soft, non-tender; bowel sounds normal; no masses, no organomegaly. ventral hernia noted. moderate, reducible, and no overlying skin changes  Musculoskeletal: Steady gait and movement  Skin: Cool and moist, no surgical scars  Psychiatric: Normal affect, non-agitated, not confused    LABS:  N/a   RADS: CLINICAL DATA: Abduct abdominal pain  EXAM: CT ABDOMEN AND PELVIS WITHOUT CONTRAST  TECHNIQUE: Multidetector CT imaging of the abdomen and pelvis was performed following the standard protocol without IV contrast.  COMPARISON: 12/02/2020  FINDINGS: Lower chest: Small bilateral pleural effusions are identified with mild interlobular septal thickening concerning for CHF.  Hepatobiliary: No focal liver abnormality is seen. No gallstones, gallbladder wall thickening, or biliary dilatation.  Pancreas: Unremarkable. No pancreatic ductal dilatation or surrounding inflammatory changes.  Spleen: Normal in size without focal abnormality.  Adrenals/Urinary Tract: Normal appearance of the adrenal glands. Exophytic cyst arises off the posteromedial cortex of the upper pole of left kidney measuring 2.6 cm. Moderate bilateral hydronephrosis and hydroureter is identified. There is a left-sided nephroureteral stent in place with proximal pigtail distal to the UPJ, image 60/5. The distal pigtail is in the urinary bladder. No signs  of obstructing stone. Diffuse bladder wall trabeculation is identified.  Stomach/Bowel: Stomach is normal. No small bowel wall thickening, inflammation or distension. Large  ventral midline hernia is identified which contains fat as well as a loop of transverse colon, image 30/2. Signs of partial obstruction are identified with transition to decreased caliber distal transverse colon, image 25/2  Vascular/Lymphatic: Aortic atherosclerosis. No abdominopelvic adenopathy.  Reproductive: Uterus and bilateral adnexa are unremarkable.  Other: No free fluid or fluid collections but. No signs of pneumoperitoneum.  Musculoskeletal: Postop change within the lumbar spine from PLIF at L4-5. Marked degenerative disc disease is noted at L1-2, L2-3 and L3-4. No acute osseous findings identified.  IMPRESSION: 1. Large ventral midline hernia is identified which a partially obstructed loop of transverse colon. 2. Persistent hydronephrosis status post placement of nephroureteral stent. The proximal pigtail of the ureteral stent is within the ureter, distal to the UPJ. 3. New right hydronephrosis and hydroureter to the level of the urinary bladder. No obstructing stone or mass noted. 4. Distended urinary bladder with diffuse bladder wall thickening. Suspect chronic bladder outlet obstruction. 5. Small bilateral pleural effusions with mild interlobular septal thickening concerning for CHF. 6. Aortic Atherosclerosis (ICD10-I70.0).   Electronically Signed By: Kerby Moors M.D. On: 11/05/2021 15:35  Assessment:    Ventral hernia with obstruction and without gangrene [K43.6]  Plan:    1. Ventral hernia with obstruction and without gangrene [K43.6]  Discussed the risk of surgery including recurrence, which can be up to 50% in the case of incisional or complex hernias, possible use of prosthetic materials (mesh) and the increased risk of mesh infxn if used, bleeding, chronic pain, post-op infxn,  post-op SBO or ileus, and possible re-operation to address said risks. The risks of general anesthetic, if used, includes MI, CVA, sudden death or even reaction to anesthetic medications also discussed. Alternatives include continued observation. Benefits include possible symptom relief, prevention of incarceration, strangulation, enlargement in size over time, and the risk of emergency surgery in the face of strangulation.   Typical post-op recovery time of 3-5 days with 2 weeks of activity restrictions were also discussed.  ED return precautions given for sudden increase in pain, size of hernia with accompanying fever, nausea, and/or vomiting.  The patient verbalized understanding and all questions were answered to the patient's satisfaction.  2. Patient has elected to proceed with surgical treatment. Procedure will be scheduled. Ventral hernia, robotic assisted laparoscopic., Pt stated "She is willing to die to have this hernia fixed", understanding she is high risk for perioperative complications as well as recurrence, due to comorbidities  Stop eliquis 3-5days prior to surgery, will also recommend overnight observation  labs/images/medications/previous chart entries reviewed personally and relevant changes/updates noted above.

## 2022-08-21 NOTE — H&P (View-Only) (Signed)
Subjective:   CC: Ventral hernia with obstruction and without gangrene [K43.6]  HPI: Tiffany [name] is a 88 y.o. female who returns for evaluation of above. Symptoms were first noted several years ago. Very mild discomfort, worse with eating. Associated with nothing, exacerbated by nothing. Lump is reducible.   Past Medical History: has a past medical history of Anemia, Atrial fibrillation (CMS-HCC), Bilateral cataracts, Chickenpox, Chronic kidney disease, stage III, DOE (dyspnea on exertion) (02/04/2015), Hyperlipidemia, Hypertension, Osteoarthritis, Right leg weakness, Spinal stenosis, Type 2 diabetes mellitus (CMS-HCC), and Urinary tract bacterial infections.  Past Surgical History:  Past Surgical History:  Procedure Laterality Date  BACK SURGERY 02/03/2010  TRANSVERSE LUMBAR INTERBODY FUSION, POSTEROLATERAL FUSION L4-5 AND l2-3  Left TKA using all cemented biomet vanguard system with a 70 mm PCR femur a 75 mm tibial tray with a 10 mm anterior e-poly insert and a 34 x 8.5 mm all poly 3 -pegged domed patella Left 05/23/2019  Dr.Poggi  ANTERIOR FUSION CERVICAL SPINE  CARDIOVERSION EXTERNAL  CATARACT EXTRACTION Bilateral  Cervical laminectomy  SHOULDER ARTHROSCOPY DISTAL CLAVICLE EXCISION AND OPEN ROTATOR CUFF REPAIR  SPINE SURGERY   Family History: She was adopted. Family history is unknown by patient.  Social History: reports that she has never smoked. She has never used smokeless tobacco. She reports that she does not drink alcohol and does not use drugs.  Current Medications: has a current medication list which includes the following prescription(s): acetaminophen, proair respiclick, amiodarone, cephalexin, toujeo solostar u-300 insulin, glimepiride, hydrocodone-acetaminophen, insulin aspart, metoprolol succinate, pen needle, diabetic, polyethylene glycol, rivaroxaban, torsemide, and blood glucose diagnostic.  Allergies:  Allergies as of 08/17/2022 - Reviewed 08/17/2022  Allergen  Reaction Noted  Amlodipine Swelling 05/07/2014  Codeine Unknown 05/07/2014  Doxycycline Vomiting 02/16/2018  Tizanidine Unknown 10/14/2020   ROS:  A 15 point review of systems was performed and pertinent positives and negatives noted in HPI  Objective:    BP (!) 192/78  Pulse 59  Ht 157.5 cm (5\' 2" )  Wt 88.5 kg (195 lb)  BMI 35.67 kg/m   Constitutional : Alert, cooperative, no distress  Lymphatics/Throat: Supple, no lymphadenopathy  Respiratory: clear to auscultation bilaterally  Cardiovascular: regular rate and rhythm  Gastrointestinal: soft, non-tender; bowel sounds normal; no masses, no organomegaly. ventral hernia noted. moderate, reducible, and no overlying skin changes  Musculoskeletal: Steady gait and movement  Skin: Cool and moist, no surgical scars  Psychiatric: Normal affect, non-agitated, not confused    LABS:  N/a   RADS: CLINICAL DATA: Abduct abdominal pain  EXAM: CT ABDOMEN AND PELVIS WITHOUT CONTRAST  TECHNIQUE: Multidetector CT imaging of the abdomen and pelvis was performed following the standard protocol without IV contrast.  COMPARISON: 12/02/2020  FINDINGS: Lower chest: Small bilateral pleural effusions are identified with mild interlobular septal thickening concerning for CHF.  Hepatobiliary: No focal liver abnormality is seen. No gallstones, gallbladder wall thickening, or biliary dilatation.  Pancreas: Unremarkable. No pancreatic ductal dilatation or surrounding inflammatory changes.  Spleen: Normal in size without focal abnormality.  Adrenals/Urinary Tract: Normal appearance of the adrenal glands. Exophytic cyst arises off the posteromedial cortex of the upper pole of left kidney measuring 2.6 cm. Moderate bilateral hydronephrosis and hydroureter is identified. There is a left-sided nephroureteral stent in place with proximal pigtail distal to the UPJ, image 60/5. The distal pigtail is in the urinary bladder. No signs  of obstructing stone. Diffuse bladder wall trabeculation is identified.  Stomach/Bowel: Stomach is normal. No small bowel wall thickening, inflammation or distension. Large  ventral midline hernia is identified which contains fat as well as a loop of transverse colon, image 30/2. Signs of partial obstruction are identified with transition to decreased caliber distal transverse colon, image 25/2  Vascular/Lymphatic: Aortic atherosclerosis. No abdominopelvic adenopathy.  Reproductive: Uterus and bilateral adnexa are unremarkable.  Other: No free fluid or fluid collections but. No signs of pneumoperitoneum.  Musculoskeletal: Postop change within the lumbar spine from PLIF at L4-5. Marked degenerative disc disease is noted at L1-2, L2-3 and L3-4. No acute osseous findings identified.  IMPRESSION: 1. Large ventral midline hernia is identified which a partially obstructed loop of transverse colon. 2. Persistent hydronephrosis status post placement of nephroureteral stent. The proximal pigtail of the ureteral stent is within the ureter, distal to the UPJ. 3. New right hydronephrosis and hydroureter to the level of the urinary bladder. No obstructing stone or mass noted. 4. Distended urinary bladder with diffuse bladder wall thickening. Suspect chronic bladder outlet obstruction. 5. Small bilateral pleural effusions with mild interlobular septal thickening concerning for CHF. 6. Aortic Atherosclerosis (ICD10-I70.0).   Electronically Signed By: Kerby Moors M.D. On: 11/05/2021 15:35  Assessment:    Ventral hernia with obstruction and without gangrene [K43.6]  Plan:    1. Ventral hernia with obstruction and without gangrene [K43.6]  Discussed the risk of surgery including recurrence, which can be up to 50% in the case of incisional or complex hernias, possible use of prosthetic materials (mesh) and the increased risk of mesh infxn if used, bleeding, chronic pain, post-op infxn,  post-op SBO or ileus, and possible re-operation to address said risks. The risks of general anesthetic, if used, includes MI, CVA, sudden death or even reaction to anesthetic medications also discussed. Alternatives include continued observation. Benefits include possible symptom relief, prevention of incarceration, strangulation, enlargement in size over time, and the risk of emergency surgery in the face of strangulation.   Typical post-op recovery time of 3-5 days with 2 weeks of activity restrictions were also discussed.  ED return precautions given for sudden increase in pain, size of hernia with accompanying fever, nausea, and/or vomiting.  The patient verbalized understanding and all questions were answered to the patient's satisfaction.  2. Patient has elected to proceed with surgical treatment. Procedure will be scheduled. Ventral hernia, robotic assisted laparoscopic., Pt stated "She is willing to die to have this hernia fixed", understanding she is high risk for perioperative complications as well as recurrence, due to comorbidities  Stop eliquis 3-5days prior to surgery, will also recommend overnight observation  labs/images/medications/previous chart entries reviewed personally and relevant changes/updates noted above.

## 2022-08-25 ENCOUNTER — Encounter: Payer: Self-pay | Admitting: Surgery

## 2022-08-25 NOTE — Progress Notes (Signed)
Perioperative Services  Pre-Admission/Anesthesia Testing Clinical Review  Date: 08/26/22  Patient Demographics:  Name: Tiffany [name] DOB:   1933-08-13 MRN:   [id number]  Planned Surgical Procedure(s):    Case: [id number] Date/Time: 08/27/22 0927   Procedure: XI ROBOTIC ASSISTED VENTRAL HERNIA (Abdomen)   Anesthesia type: General   Pre-op diagnosis: Ventral hernia with obstruction and without gangrene K43.6   Location: [facility] OR ROOM 06 / [place] [facility] FOR ANESTHESIA [facility]   Surgeons: [name], DO   NOTE: Available PAT nursing documentation and vital signs have been reviewed. Clinical nursing staff has updated patient's PMH/PSHx, current medication list, and drug allergies/intolerances to ensure comprehensive history available to assist in medical decision making as it pertains to the aforementioned surgical procedure and anticipated anesthetic course. Extensive review of available clinical information performed. Tiffany [name] PMH and PSHx updated with any diagnoses/procedures that  may have been inadvertently omitted during her intake with the pre-admission testing department's nursing staff.  Clinical Discussion:  Tiffany [name] is a 88 y.o. female who is submitted for pre-surgical anesthesia review and clearance prior to her undergoing the above procedure. Patient has never been a smoker. Pertinent PMH includes: CAD, HFrEF, PAF, LBBB, aortic atherosclerosis, HTN, HLD, T2DM, CKD-IV, DOE, anemia, OA, cervical DDD (s/p fusion), lumbar DDD, s/p fusion), lumbar spinal stenosis, ventral hernia, long-term opioid use.  Patient is followed by cardiology Corky Sox, MD). She was last seen in the cardiology clinic on 03/18/2022; notes reviewed.  At the time of her clinic visit, patient was well overall from a cardiovascular perspective.  She denied any episodes of chest pain, shortness breath, PND, orthopnea, palpitations, vertiginous symptoms, or presyncope/syncope.  Patient with chronic BILATERAL lower  extremity edema that she reported to be worse despite diuretic therapies.  Patient with a past medical history significant for cardiovascular diagnoses.  Myocardial perfusion imaging study performed on 02/27/2015 revealed a normal left ventricular systolic function with an EF of 62%.  There was a small area of hypoperfusion with borderline partial redistribution.  Study determined to be equivocal.  Diagnostic left heart catheterization performed on 03/13/2015 revealing a normal left ventricular systolic function.  LVEDP 16 mmHg.  There was minimal nonobstructive coronary artery calcifications noted; 10% mid LAD and 10% RCA.  Intervention was deferred opting for medical management.  Patient underwent DCCV procedure on 01/12/2017 whereby she received a single 150 J cardioversion restoring NSR.  Due to refractory atrial arrhythmia, patient required second DCCV procedure on 11/04/2021, this time requiring a single 120 J cardioversion to restore NSR.  Most recent myocardial perfusion imaging study was performed on 03/16/2019 revealing normal biventricular systolic function with an EF of 56%.  There was no evidence of stress-induced myocardial ischemia or arrhythmia; no scintigraphic evidence of scar.  Study determined to be normal and low risk.  Last TTE was performed on 11/06/2021 revealing a moderately reduced left ventricular systolic function with an EF of 30-35%.  There was mild apical and anteroseptal hypokinesis.  Diastolic Doppler parameters consistent with pseudonormalization (G2DD). Mild tricuspid and moderate mitral valve regurgitation noted.  There was no evidence of a significant transvalvular gradient to suggest stenosis.  Patient with an atrial fibrillation diagnosis; CHA2DS2-VASc Score = 7 (age x 2, sex, HFrEF, HTN, vascular disease history, T2DM). Her rate and rhythm are currently being maintained on oral amiodarone + metoprolol succinate. She is chronically anticoagulated using rivaroxaban;  reported to be compliant with therapy with no evidence or reports of GI bleeding.  Patient has undergone DCCV x 2 (  see above).  Blood pressure elevated at 160/72 on currently prescribed beta-blocker (metoprolol succinate) and diuretic (torsemide) therapies.  Patient not currently taking any type of lipid-lowering therapies for her HLD diagnosis or further ASCVD prevention. T2DM reasonably controlled on currently prescribed regimen; last HgbA1c was 7.4% when checked on 08/05/2022.  Patient reported to maintain an overall sedentary lifestyle.  She spends most of her day sitting. Functional capacity, as defined by DASI, is documented as being </= 4 METS.  No changes were made to her medication regimen.  Patient to follow-up with outpatient cardiology in 6 months or sooner if needed.  Tiffany [name] is scheduled for an elective ROBOTIC ASSISTED VENTRAL HERNIA REPAIR on 08/27/2022 with Dr. [name], DO. Given patient's past medical history significant for cardiovascular diagnoses, presurgical cardiac clearance was sought by the PAT team. Per cardiology, "this patient is optimized for surgery and may proceed with the planned procedural course with a LOW risk of significant perioperative cardiovascular complications".  In review of her medication reconciliation, it is noted the patient is on daily anticoagulation therapy.  She has been instructed on recommendations for holding her rivaroxaban dose for 3 days prior to her procedure with plans to restart her since postoperative bleeding risk up to be minimized by her primary attending surgeon.  The patient is aware that her last dose of rivaroxaban will be on 08/23/2022.  Patient reports previous perioperative complications with anesthesia in the past. Patient has been identified as a (+) difficult intubation due to previous cervical fusion.  Additionally, patient has a PMH (+) for PONV. Symptoms and history of PONV will be discussed with patient by anesthesia team  on the day of her procedure. Interventions will be ordered as deemed necessary based on patient's individual care needs as determined by anesthesiologist. In review of the available records, it is noted that patient underwent a general anesthetic course here at [facility] ([facility]) in 03/2022 without documented complications.      08/21/2022   11:33 AM 04/10/2022    8:30 AM 04/10/2022    8:15 AM  Vitals with BMI  Height _0     Weight 198 lbs    BMI 80.03    Systolic  491 791  Diastolic  68 76  Pulse  52 54    Providers/Specialists:   NOTE: Primary physician provider listed below. Patient may have been seen by APP or partner within same practice.   PROVIDER ROLE / SPECIALTY LAST Shirline Frees, DO General Surgery (Surgeon) 08/17/2022  Adin Hector, MD Primary Care Provider 08/12/2022  Donnelly Angelica, MD Cardiology 03/18/2022   Allergies:  Amlodipine, Codeine, Tizanidine, and Doxycycline  Current Home Medications:   No current facility-administered medications for this encounter.    acetaminophen (TYLENOL) 500 MG tablet   amiodarone (PACERONE) 200 MG tablet   cephALEXin (KEFLEX) 500 MG capsule   glimepiride (AMARYL) 2 MG tablet   glucose blood (PRECISION QID TEST) test strip   HYDROcodone-acetaminophen (NORCO/VICODIN) 5-325 MG tablet   hydrocortisone cream 1 %   insulin glargine, 1 Unit Dial, (TOUJEO SOLOSTAR) 300 UNIT/ML Solostar Pen   Insulin Pen Needle (BD PEN NEEDLE NANO U/F) 32G X 4 MM MISC   metoprolol succinate (TOPROL-XL) 25 MG 24 hr tablet   NOVOLOG FLEXPEN 100 UNIT/ML FlexPen   polyethylene glycol (MIRALAX / GLYCOLAX) 17 g packet   predniSONE (DELTASONE) 5 MG tablet   rivaroxaban (XARELTO) 15 MG TABS tablet   rivaroxaban (XARELTO)  20 MG TABS tablet   torsemide (DEMADEX) 20 MG tablet   History:   Past Medical History:  Diagnosis Date   Anemia    Aortic atherosclerosis (HCC)    Arthritis    CAD (coronary artery  disease) 02/27/2015   a.) MPI 02/27/2015: EF 62%, small/mild area of septal hypoperfusion with borderline partial redistribution; equivocal study; b.) LHC 03/13/2015: LVEDP 16 mmHg; 10% mLAD, 10% RCA --> no intervention required (med mgmt); c.) MPI 03/16/2019: EF 56%; normal study.   Cataracts, both eyes    Cervical disc disease    a.) s/p cervical fusion   Chickenpox    CKD (chronic kidney disease), stage IV (HCC)    Complication of anesthesia    a.) remote h/o PONV; b.) difficult intubation related to remote cervical fusion   Difficult intubation    a.) secondary to remote cervical fusion; reports severe pain s/p being intubated   Dyspnea on exertion    HFrEF (heart failure with reduced ejection fraction) ([place])    a.) TTE 01/11/2014: EF >55%, mod LVH, MAC, mild RAE, mild MR/TR, sev PR; b.) TTE 11/14/2018: EF 40%, sep and inf HK, mild LAE, triv P, mod MR/TR, G2DD; c.) TTE 11/15/2020: EF 45%, sep HK, mild LVH, triv AR/PR, mild MR/TR, d.) TTE 10/28/2021: EF 45%, mild glob HK, mild LVH, triv AR/PR, mild MR, mod TR; e.) TTE 11/06/2021: EF 30-35%, mid apical and anteroseptal HK, mild TR, mod MR, G2DD.   History of recurrent UTI (urinary tract infection)    Hyperlipidemia    Hypertension    Incontinence in female    wears pads   LBBB (left bundle branch block)    Long term (current) use of anticoagulants    a.) rivaroxaban   Long-term current use of opiate analgesic    a.) hydrocodone/APAP ([place])   Lumbar disc disease    a.) s/p lumbar interbody fusion L2-L5   PAF (paroxysmal atrial fibrillation) ([place])    a.) CHA2DS2VASc = 7 (age x2, sex, HFrEF, HTN, vascular diasease, T2DM); b.) s/p DCCV 01/12/2017 (150 J x 1) and 11/04/2021 (120 J x 1); c.) rate/rhythm maintained on oral amiodarone + metoprolol succinate; chronically anticoagulated with rivaroxaban   PONV (postoperative nausea and vomiting)    with 1st pregnancy 50 years ago and no problem since then   Right leg weakness    Sciatica     Spinal stenosis of lumbar region    Type 2 diabetes mellitus treated with insulin ([place])    Ventral hernia    Past Surgical History:  Procedure Laterality Date   CARDIOVERSION N/A 11/04/2021   Procedure: CARDIOVERSION;  Surgeon: Teodoro Spray, MD;  Location: [facility] ORS;  Service: Cardiovascular;  Laterality: N/A;   CATARACT EXTRACTION, BILATERAL     CERVICAL LAMINECTOMY  1996   CYSTOSCOPY W/ RETROGRADES  12/06/2020   Procedure: CYSTOSCOPY WITH RETROGRADE PYELOGRAM;  Surgeon: Billey Co, MD;  Location: [facility] ORS;  Service: Urology;;   CYSTOSCOPY W/ URETERAL STENT PLACEMENT Left 04/10/2022   Procedure: CYSTOSCOPY WITH STENT REPLACEMENT;  Surgeon: Billey Co, MD;  Location: [facility] ORS;  Service: Urology;  Laterality: Left;   CYSTOSCOPY WITH STENT PLACEMENT Left 12/06/2020   Procedure: CYSTOSCOPY WITH STENT PLACEMENT;  Surgeon: Billey Co, MD;  Location: [facility] ORS;  Service: Urology;  Laterality: Left;   ELECTROPHYSIOLOGIC STUDY N/A 01/12/2017   Procedure: Cardioversion;  Surgeon: Teodoro Spray, MD;  Location: [facility] ORS;  Service: Cardiovascular;  Laterality: N/A;   JOINT REPLACEMENT  LATERAL FUSION LUMBAR SPINE, TRANSVERSE  01/2010   L2-L5   LEFT HEART CATH AND CORONARY ANGIOGRAPHY Left 03/13/2015   Procedure: LEFT HEART CATH AND CORONARY ANGIOGRAPHY; Location: [place]; Surgeon: Bartholome Bill, MD   ROTATOR CUFF REPAIR Right 2010   TONSILLECTOMY     TOTAL KNEE ARTHROPLASTY Left 05/23/2019   Procedure: TOTAL KNEE ARTHROPLASTY - LEFT - DIABETIC;  Surgeon: Corky Mull, MD;  Location: [facility] ORS;  Service: Orthopedics;  Laterality: Left;   URETEROSCOPY N/A 12/06/2020   Procedure: DIAGNOSTIC URETEROSCOPY;  Surgeon: Billey Co, MD;  Location: [facility] ORS;  Service: Urology;  Laterality: N/A;   Family History  Adopted: Yes   Social History   Tobacco Use   Smoking status: Never    Passive exposure: Never   Smokeless tobacco: Never  Vaping Use   Vaping Use: Never used   Substance Use Topics   Alcohol use: Yes    Alcohol/week: 1.0 standard drink of alcohol    Types: 1 Glasses of wine per week    Comment: RARE   Drug use: Never    Pertinent Clinical Results:  LABS: Labs reviewed: Acceptable for surgery. Order: [id number]  Ref Range & Units 08/05/2025  WBC (White Blood Cell Count) 4.1 - 10.2 10^3/uL 6.1   RBC (Red Blood Cell Count) 4.04 - 5.48 10^6/uL 4.48   Hemoglobin 12.0 - 15.0 gm/dL 12.3   Hematocrit 35.0 - 47.0 % 38.8   MCV (Mean Corpuscular Volume) 80.0 - 100.0 fl 86.6   MCH (Mean Corpuscular Hemoglobin) 27.0 - 31.2 pg 27.5   MCHC (Mean Corpuscular Hemoglobin Concentration) 32.0 - 36.0 gm/dL 31.7 Low    Platelet Count 150 - 450 10^3/uL 191   RDW-CV (Red Cell Distribution Width) 11.6 - 14.8 % 14.4   MPV (Mean Platelet Volume) 9.4 - 12.4 fl 10.3   Neutrophils 1.50 - 7.80 10^3/uL 2.66   Lymphocytes 1.00 - 3.60 10^3/uL 2.52   Monocytes 0.00 - 1.50 10^3/uL 0.61   Eosinophils 0.00 - 0.55 10^3/uL 0.25   Basophils 0.00 - 0.09 10^3/uL 0.06   Neutrophil % 32.0 - 70.0 % 43.3   Lymphocyte % 10.0 - 50.0 % 41.1   Monocyte % 4.0 - 13.0 % 10.0   Eosinophil % 1.0 - 5.0 % 4.1   Basophil% 0.0 - 2.0 % 1.0   Immature Granulocyte % <=0.7 % 0.5   Immature Granulocyte Count <=0.06 10^3/L 0.03    Specimen Collected: 08/05/22 08:26   Performed by: [facility] - LAB Last Resulted: 08/05/22 09:20  Received From: [place]  Result Received: 08/17/22 10:26  \  Ref Range & Units 08/05/2022  Glucose 70 - 110 mg/dL 103   Sodium 136 - 145 mmol/L 141   Potassium 3.6 - 5.1 mmol/L 4.1   Chloride 97 - 109 mmol/L 107   Carbon Dioxide (CO2) 22.0 - 32.0 mmol/L 27.7   Urea Nitrogen (BUN) 7 - 25 mg/dL 32 High    Creatinine 0.6 - 1.1 mg/dL 2.0 High    Glomerular Filtration Rate (eGFR), MDRD Estimate >60 mL/min/1.73sq m 24 Low    Calcium 8.7 - 10.3 mg/dL 8.9   AST  8 - 39 U/L 13   ALT  5 - 38 U/L 12   Alk Phos (alkaline Phosphatase) 34 - 104  U/L 66   Albumin 3.5 - 4.8 g/dL 3.7   Bilirubin, Total 0.3 - 1.2 mg/dL 0.5   Protein, Total 6.1 - 7.9 g/dL 5.8 Low    A/G Ratio  1.0 - 5.0 gm/dL 1.8    Specimen Collected: 08/05/22 08:26   Performed by: [place]: 08/05/22 12:18  Received From: [facility]  Result Received: 08/17/22 10:26    Ref Range & Units 08/05/2022  Hemoglobin A1C 4.2 - 5.6 % 7.4 High    Average Blood Glucose (Calc) mg/dL 166   Normal Range:    4.2 - 5.6%  Increased Risk:  5.7 - 6.4%  Diabetes:        >= 6.5%  Glycemic Control for adults with diabetes:  <7%   Specimen Collected: 08/05/22 08:26   Performed by: [place]: 08/05/22 10:26  Received From: [place]  Result Received: 08/17/22 10:26    ECG: Date: 03/18/2022 Rate: 55 bpm Rhythm: sinus bradycardia; LBBB Axis (leads I and aVF): Left axis deviation Intervals: PR 184 ms. QRS 148 ms. QTc 461 ms. ST segment and T wave changes: No evidence of acute ST segment elevation or depression Comparison: Similar to previous tracing obtained on 10/27/2021 NOTE: Tracing obtained at [facility]; unable for review. Above based on cardiologist's interpretation.    IMAGING / PROCEDURES: TRANSTHORACIC ECHOCARDIOGRAM performed on 11/06/2021 Left ventricular ejection fraction, by estimation, is 30 to 35%. The left ventricle has moderately decreased function. The left ventricle demonstrates regional wall motion abnormalities The left ventricular internal cavity size was mildly dilated. Left ventricular diastolic parameters are consistent with Grade II diastolic dysfunction (pseudonormalization).  Right ventricular systolic function is normal. The right ventricular size is normal.  The mitral valve is normal in structure. Moderate mitral valve regurgitation.  The aortic valve is normal in structure. Aortic valve regurgitation is not visualized.   MYOCARDIAL PERFUSION  IMAGING STUDY (LEXISCAN) performed on 03/16/2019 Normal left ventricular systolic function with a normal LVEF of 56% Normal myocardial thickening and wall motion Left ventricular cavity size normal SPECT images demonstrate homogenous tracer distribution throughout the myocardium No evidence of stress-induced myocardial ischemia or arrhythmia Normal low risk study  Impression and Plan:  Tiffany [name] has been referred for pre-anesthesia review and clearance prior to her undergoing the planned anesthetic and procedural courses. Available labs, pertinent testing, and imaging results were personally reviewed by me. This patient has been appropriately cleared by cardiology with an overall LOW risk of significant perioperative cardiovascular complications.  Based on clinical review performed today (08/26/22), barring any significant acute changes in the patient's overall condition, it is anticipated that she will be able to proceed with the planned surgical intervention. Any acute changes in clinical condition may necessitate her procedure being postponed and/or cancelled. Patient will meet with anesthesia team (MD and/or CRNA) on the day of her procedure for preoperative evaluation/assessment. Questions regarding anesthetic course will be fielded at that time.   Pre-surgical instructions were reviewed with the patient during her PAT appointment and questions were fielded by PAT clinical staff. Patient was advised that if any questions or concerns arise prior to her procedure then she should return a call to PAT and/or her surgeon's office to discuss.  Honor Loh, MSN, APRN, FNP-C, CEN [facility]  Peri-operative Services Nurse Practitioner Phone: [phone] Fax: [phone] 08/26/22 1:06 PM  NOTE: This note has been prepared using Dragon dictation software. Despite my best ability to proofread, there is always the potential that unintentional transcriptional errors may  still occur from this process.

## 2022-08-26 ENCOUNTER — Encounter: Payer: Self-pay | Admitting: Surgery

## 2022-08-26 MED ORDER — CHLORHEXIDINE GLUCONATE CLOTH 2 % EX PADS: 6.0000 | MEDICATED_PAD | Freq: Once | CUTANEOUS | Status: DC

## 2022-08-26 MED ORDER — CEFAZOLIN IN SODIUM CHLORIDE 3-0.9 GM/100ML-% IV SOLN
3.0000 g | INTRAVENOUS | Status: DC
  Filled 2022-08-26: qty 100

## 2022-08-26 MED ORDER — GABAPENTIN 300 MG PO CAPS: 300.0000 mg | ORAL_CAPSULE | ORAL | Status: AC

## 2022-08-26 MED ORDER — FAMOTIDINE 20 MG PO TABS: 20.0000 mg | ORAL_TABLET | Freq: Once | ORAL | Status: AC

## 2022-08-26 MED ORDER — ACETAMINOPHEN 500 MG PO TABS: 1000.0000 mg | ORAL_TABLET | ORAL | Status: AC

## 2022-08-26 MED ORDER — CELECOXIB 200 MG PO CAPS: 200.0000 mg | ORAL_CAPSULE | ORAL | Status: AC

## 2022-08-27 ENCOUNTER — Ambulatory Visit: Payer: PPO | Admitting: Urgent Care

## 2022-08-27 ENCOUNTER — Encounter: Payer: Self-pay | Admitting: Surgery

## 2022-08-27 ENCOUNTER — Other Ambulatory Visit: Payer: Self-pay

## 2022-08-27 ENCOUNTER — Encounter
Admission: RE | Disposition: A | Discharge status: Home / Self Care | Payer: Self-pay | Source: Home / Self Care | Attending: Surgery

## 2022-08-27 ENCOUNTER — Observation Stay
Admission: RE | Admit: 2022-08-27 | Discharge: 2022-08-28 | Disposition: A | Discharge status: Home / Self Care | Payer: PPO | Attending: Surgery | Admitting: Surgery

## 2022-08-27 DIAGNOSIS — I4891 Unspecified atrial fibrillation: Secondary | ICD-10-CM | POA: Diagnosis not present

## 2022-08-27 DIAGNOSIS — E1122 Type 2 diabetes mellitus with diabetic chronic kidney disease: Secondary | ICD-10-CM | POA: Insufficient documentation

## 2022-08-27 DIAGNOSIS — N183 Chronic kidney disease, stage 3 unspecified: Secondary | ICD-10-CM | POA: Diagnosis not present

## 2022-08-27 DIAGNOSIS — Z96652 Presence of left artificial knee joint: Secondary | ICD-10-CM | POA: Diagnosis not present

## 2022-08-27 DIAGNOSIS — Z01812 Encounter for preprocedural laboratory examination: Secondary | ICD-10-CM

## 2022-08-27 DIAGNOSIS — K439 Ventral hernia without obstruction or gangrene: Principal | ICD-10-CM | POA: Insufficient documentation

## 2022-08-27 DIAGNOSIS — I110 Hypertensive heart disease with heart failure: Secondary | ICD-10-CM | POA: Diagnosis not present

## 2022-08-27 DIAGNOSIS — E119 Type 2 diabetes mellitus without complications: Secondary | ICD-10-CM

## 2022-08-27 DIAGNOSIS — I509 Heart failure, unspecified: Secondary | ICD-10-CM | POA: Diagnosis not present

## 2022-08-27 DIAGNOSIS — K436 Other and unspecified ventral hernia with obstruction, without gangrene: Secondary | ICD-10-CM | POA: Diagnosis not present

## 2022-08-27 DIAGNOSIS — I2510 Atherosclerotic heart disease of native coronary artery without angina pectoris: Secondary | ICD-10-CM | POA: Diagnosis not present

## 2022-08-27 DIAGNOSIS — I129 Hypertensive chronic kidney disease with stage 1 through stage 4 chronic kidney disease, or unspecified chronic kidney disease: Secondary | ICD-10-CM | POA: Insufficient documentation

## 2022-08-27 HISTORY — DX: Long term (current) use of opiate analgesic: Z79.891

## 2022-08-27 HISTORY — DX: Paroxysmal atrial fibrillation: I48.0

## 2022-08-27 HISTORY — DX: Other forms of dyspnea: R06.09

## 2022-08-27 HISTORY — DX: Unspecified thoracic, thoracolumbar and lumbosacral intervertebral disc disorder: M51.9

## 2022-08-27 HISTORY — PX: XI ROBOTIC ASSISTED VENTRAL HERNIA: SHX6789

## 2022-08-27 HISTORY — DX: Spinal stenosis, lumbar region without neurogenic claudication: M48.061

## 2022-08-27 HISTORY — DX: Atherosclerosis of aorta: I70.0

## 2022-08-27 HISTORY — DX: Chronic kidney disease, stage 4 (severe): N18.4

## 2022-08-27 HISTORY — DX: Type 2 diabetes mellitus without complications: Z79.4

## 2022-08-27 HISTORY — DX: Long term (current) use of insulin: E11.9

## 2022-08-27 HISTORY — DX: Left bundle-branch block, unspecified: I44.7

## 2022-08-27 HISTORY — PX: INSERTION OF MESH: SHX5868

## 2022-08-27 HISTORY — DX: Unspecified systolic (congestive) heart failure: I50.20

## 2022-08-27 HISTORY — DX: Sciatica, unspecified side: M54.30

## 2022-08-27 HISTORY — DX: Ventral hernia without obstruction or gangrene: K43.9

## 2022-08-27 HISTORY — DX: Cervical disc disorder, unspecified, unspecified cervical region: M50.90

## 2022-08-27 HISTORY — DX: Personal history of urinary (tract) infections: Z87.440

## 2022-08-27 LAB — GLUCOSE, CAPILLARY
Glucose-Capillary: 86 mg/dL (ref 70–99)
Glucose-Capillary: 163 mg/dL — ABNORMAL HIGH (ref 70–99)
Glucose-Capillary: 169 mg/dL — ABNORMAL HIGH (ref 70–99)
Glucose-Capillary: 279 mg/dL — ABNORMAL HIGH (ref 70–99)

## 2022-08-27 LAB — HEMOGLOBIN A1C
Hgb A1c MFr Bld: 7.4 % — ABNORMAL HIGH (ref 4.8–5.6)
Mean Plasma Glucose: 165.68 mg/dL

## 2022-08-27 SURGERY — REPAIR, HERNIA, VENTRAL, ROBOT-ASSISTED
Anesthesia: General | Site: Abdomen

## 2022-08-27 MED ORDER — ONDANSETRON HCL 4 MG/2ML IJ SOLN
INTRAMUSCULAR | Status: DC | PRN
  Administered 2022-08-27: 4 mg via INTRAVENOUS

## 2022-08-27 MED ORDER — CEFAZOLIN SODIUM-DEXTROSE 2-3 GM-%(50ML) IV SOLR
INTRAVENOUS | Status: DC | PRN
  Administered 2022-08-27: 2 g via INTRAVENOUS

## 2022-08-27 MED ORDER — CELECOXIB 200 MG PO CAPS
ORAL_CAPSULE | ORAL | Status: AC
  Administered 2022-08-27: 200 mg via ORAL
  Filled 2022-08-27: qty 1

## 2022-08-27 MED ORDER — CHLORHEXIDINE GLUCONATE 0.12 % MT SOLN
OROMUCOSAL | Status: AC
  Administered 2022-08-27: 15 mL via OROMUCOSAL
  Filled 2022-08-27: qty 15

## 2022-08-27 MED ORDER — AMIODARONE HCL 200 MG PO TABS
200.0000 mg | ORAL_TABLET | Freq: Every day | ORAL | Status: DC
  Administered 2022-08-28: 200 mg via ORAL
  Filled 2022-08-27: qty 1

## 2022-08-27 MED ORDER — INSULIN ASPART 100 UNIT/ML IJ SOLN
0.0000 [IU] | Freq: Three times a day (TID) | INTRAMUSCULAR | Status: DC
  Administered 2022-08-27: 4 [IU] via SUBCUTANEOUS
  Administered 2022-08-28: 7 [IU] via SUBCUTANEOUS
  Filled 2022-08-27 (×2): qty 1

## 2022-08-27 MED ORDER — ROCURONIUM BROMIDE 10 MG/ML (PF) SYRINGE
PREFILLED_SYRINGE | INTRAVENOUS | Status: AC
  Filled 2022-08-27: qty 10

## 2022-08-27 MED ORDER — FENTANYL CITRATE (PF) 100 MCG/2ML IJ SOLN
25.0000 ug | INTRAMUSCULAR | Status: DC | PRN
  Administered 2022-08-27: 25 ug via INTRAVENOUS

## 2022-08-27 MED ORDER — POLYETHYLENE GLYCOL 3350 17 G PO PACK: 8.5000 g | PACK | Freq: Every day | ORAL | Status: DC | PRN

## 2022-08-27 MED ORDER — SODIUM CHLORIDE (PF) 0.9 % IJ SOLN
INTRAMUSCULAR | Status: AC
  Filled 2022-08-27: qty 50

## 2022-08-27 MED ORDER — HYDROCORTISONE 1 % EX CREA
1.0000 | TOPICAL_CREAM | Freq: Two times a day (BID) | CUTANEOUS | Status: DC | PRN
Start: 2022-08-27 — End: 2022-08-28

## 2022-08-27 MED ORDER — FENTANYL CITRATE (PF) 100 MCG/2ML IJ SOLN
INTRAMUSCULAR | Status: DC | PRN
Start: 2022-08-27 — End: 2022-08-27
  Administered 2022-08-27: 50 ug via INTRAVENOUS
  Administered 2022-08-27 (×2): 25 ug via INTRAVENOUS

## 2022-08-27 MED ORDER — EPHEDRINE SULFATE (PRESSORS) 50 MG/ML IJ SOLN
INTRAMUSCULAR | Status: DC | PRN
  Administered 2022-08-27 (×2): 5 mg via INTRAVENOUS
  Administered 2022-08-27: 10 mg via INTRAVENOUS

## 2022-08-27 MED ORDER — PROPOFOL 10 MG/ML IV BOLUS
INTRAVENOUS | Status: DC | PRN
  Administered 2022-08-27: 100 mg via INTRAVENOUS

## 2022-08-27 MED ORDER — INSULIN ASPART 100 UNIT/ML IJ SOLN
0.0000 [IU] | Freq: Every day | INTRAMUSCULAR | Status: DC
  Administered 2022-08-27: 3 [IU] via SUBCUTANEOUS
  Filled 2022-08-27: qty 1

## 2022-08-27 MED ORDER — GLYCOPYRROLATE 0.2 MG/ML IJ SOLN
INTRAMUSCULAR | Status: DC | PRN
  Administered 2022-08-27 (×2): .2 mg via INTRAVENOUS

## 2022-08-27 MED ORDER — ONDANSETRON HCL 4 MG/2ML IJ SOLN
INTRAMUSCULAR | Status: AC
  Filled 2022-08-27: qty 2

## 2022-08-27 MED ORDER — METOPROLOL SUCCINATE ER 25 MG PO TB24
25.0000 mg | ORAL_TABLET | Freq: Every day | ORAL | Status: DC
  Administered 2022-08-28: 25 mg via ORAL
  Filled 2022-08-27: qty 1

## 2022-08-27 MED ORDER — CELECOXIB 200 MG PO CAPS
200.0000 mg | ORAL_CAPSULE | Freq: Two times a day (BID) | ORAL | Status: DC
  Administered 2022-08-28: 200 mg via ORAL
  Filled 2022-08-27: qty 1

## 2022-08-27 MED ORDER — DEXAMETHASONE SODIUM PHOSPHATE 10 MG/ML IJ SOLN
INTRAMUSCULAR | Status: DC | PRN
  Administered 2022-08-27: 10 mg via INTRAVENOUS

## 2022-08-27 MED ORDER — SODIUM CHLORIDE 0.9 % IV SOLN: INTRAVENOUS | Status: DC | PRN

## 2022-08-27 MED ORDER — ROCURONIUM BROMIDE 100 MG/10ML IV SOLN
INTRAVENOUS | Status: DC | PRN
  Administered 2022-08-27: 20 mg via INTRAVENOUS
  Administered 2022-08-27 (×3): 10 mg via INTRAVENOUS

## 2022-08-27 MED ORDER — ACETAMINOPHEN 500 MG PO TABS: 500.0000 mg | ORAL_TABLET | Freq: Four times a day (QID) | ORAL | Status: DC | PRN

## 2022-08-27 MED ORDER — LACTATED RINGERS IV SOLN: INTRAVENOUS | Status: DC

## 2022-08-27 MED ORDER — GABAPENTIN 300 MG PO CAPS
ORAL_CAPSULE | ORAL | Status: AC
  Administered 2022-08-27: 300 mg via ORAL
  Filled 2022-08-27: qty 1

## 2022-08-27 MED ORDER — ACETAMINOPHEN 500 MG PO TABS
ORAL_TABLET | ORAL | Status: AC
  Administered 2022-08-27: 1000 mg via ORAL
  Filled 2022-08-27: qty 2

## 2022-08-27 MED ORDER — HYDRALAZINE HCL 20 MG/ML IJ SOLN
5.0000 mg | Freq: Four times a day (QID) | INTRAMUSCULAR | Status: DC | PRN
  Administered 2022-08-27 – 2022-08-28 (×2): 5 mg via INTRAVENOUS
  Filled 2022-08-27 (×2): qty 1

## 2022-08-27 MED ORDER — FENTANYL CITRATE (PF) 100 MCG/2ML IJ SOLN
INTRAMUSCULAR | Status: AC
  Filled 2022-08-27: qty 2

## 2022-08-27 MED ORDER — GABAPENTIN 300 MG PO CAPS
300.0000 mg | ORAL_CAPSULE | Freq: Two times a day (BID) | ORAL | Status: DC
Start: 2022-08-28 — End: 2022-08-28
  Administered 2022-08-28: 300 mg via ORAL
  Filled 2022-08-27: qty 1

## 2022-08-27 MED ORDER — HYDROMORPHONE HCL 1 MG/ML IJ SOLN
0.5000 mg | INTRAMUSCULAR | Status: DC | PRN
  Administered 2022-08-27: 0.5 mg via INTRAVENOUS
  Filled 2022-08-27: qty 0.5

## 2022-08-27 MED ORDER — BUPIVACAINE LIPOSOME 1.3 % IJ SUSP
INTRAMUSCULAR | Status: AC
  Filled 2022-08-27: qty 20

## 2022-08-27 MED ORDER — BUPIVACAINE-EPINEPHRINE (PF) 0.5% -1:200000 IJ SOLN
INTRAMUSCULAR | Status: AC
  Filled 2022-08-27: qty 30

## 2022-08-27 MED ORDER — ORAL CARE MOUTH RINSE: 15.0000 mL | Freq: Once | OROMUCOSAL | Status: AC

## 2022-08-27 MED ORDER — ONDANSETRON HCL 4 MG/2ML IJ SOLN: 4.0000 mg | Freq: Once | INTRAMUSCULAR | Status: DC | PRN

## 2022-08-27 MED ORDER — FAMOTIDINE 20 MG PO TABS
ORAL_TABLET | ORAL | Status: AC
  Administered 2022-08-27: 20 mg via ORAL
  Filled 2022-08-27: qty 1

## 2022-08-27 MED ORDER — SUGAMMADEX SODIUM 200 MG/2ML IV SOLN
INTRAVENOUS | Status: DC | PRN
  Administered 2022-08-27 (×2): 100 mg via INTRAVENOUS

## 2022-08-27 MED ORDER — SUCCINYLCHOLINE CHLORIDE 200 MG/10ML IV SOSY
PREFILLED_SYRINGE | INTRAVENOUS | Status: DC | PRN
  Administered 2022-08-27: 90 mg via INTRAVENOUS

## 2022-08-27 MED ORDER — CHLORHEXIDINE GLUCONATE 0.12 % MT SOLN: 15.0000 mL | Freq: Once | OROMUCOSAL | Status: AC

## 2022-08-27 MED ORDER — PROPOFOL 10 MG/ML IV BOLUS
INTRAVENOUS | Status: AC
  Filled 2022-08-27: qty 20

## 2022-08-27 MED ORDER — PHENYLEPHRINE HCL-NACL 20-0.9 MG/250ML-% IV SOLN
INTRAVENOUS | Status: DC | PRN
  Administered 2022-08-27: 35 ug/min via INTRAVENOUS

## 2022-08-27 MED ORDER — DEXAMETHASONE SODIUM PHOSPHATE 10 MG/ML IJ SOLN
INTRAMUSCULAR | Status: AC
  Filled 2022-08-27: qty 1

## 2022-08-27 MED ORDER — SODIUM CHLORIDE (PF) 0.9 % IJ SOLN
INTRAMUSCULAR | Status: DC | PRN
  Administered 2022-08-27: 100 mL

## 2022-08-27 MED ORDER — LIDOCAINE HCL (CARDIAC) PF 100 MG/5ML IV SOSY
PREFILLED_SYRINGE | INTRAVENOUS | Status: DC | PRN
  Administered 2022-08-27: 60 mg via INTRAVENOUS

## 2022-08-27 MED ORDER — DEXMEDETOMIDINE (PRECEDEX) IN NS 20 MCG/5ML (4 MCG/ML) IV SYRINGE
PREFILLED_SYRINGE | INTRAVENOUS | Status: DC | PRN
  Administered 2022-08-27 (×2): 8 ug via INTRAVENOUS
  Administered 2022-08-27: 4 ug via INTRAVENOUS

## 2022-08-27 MED ORDER — GLIMEPIRIDE 2 MG PO TABS
2.0000 mg | ORAL_TABLET | Freq: Every day | ORAL | Status: DC
  Administered 2022-08-28: 2 mg via ORAL
  Filled 2022-08-27: qty 1

## 2022-08-27 MED ORDER — FENTANYL CITRATE (PF) 100 MCG/2ML IJ SOLN
INTRAMUSCULAR | Status: AC
  Administered 2022-08-27: 25 ug via INTRAVENOUS
  Filled 2022-08-27: qty 2

## 2022-08-27 MED ORDER — LIDOCAINE HCL (PF) 2 % IJ SOLN
INTRAMUSCULAR | Status: AC
  Filled 2022-08-27: qty 5

## 2022-08-27 MED ORDER — CEPHALEXIN 500 MG PO CAPS
500.0000 mg | ORAL_CAPSULE | Freq: Every day | ORAL | Status: DC
  Administered 2022-08-27 – 2022-08-28 (×2): 500 mg via ORAL
  Filled 2022-08-27 (×2): qty 1

## 2022-08-27 MED ORDER — HYDROCODONE-ACETAMINOPHEN 5-325 MG PO TABS
0.5000 | ORAL_TABLET | Freq: Two times a day (BID) | ORAL | Status: DC | PRN
  Administered 2022-08-27: 0.5 via ORAL
  Filled 2022-08-27: qty 1

## 2022-08-27 SURGICAL SUPPLY — 65 items
BLADE SURG SZ11 CARB STEEL (BLADE) ×1 IMPLANT
BULB RESERV EVAC DRAIN JP 100C (MISCELLANEOUS) IMPLANT
CANNULA REDUC XI 12-8 STAPL (CANNULA) ×1
CANNULA REDUCER 12-8 DVNC XI (CANNULA) ×1 IMPLANT
COVER TIP SHEARS 8 DVNC (MISCELLANEOUS) ×1 IMPLANT
COVER TIP SHEARS 8MM DA VINCI (MISCELLANEOUS) ×1
DERMABOND ADVANCED (GAUZE/BANDAGES/DRESSINGS) ×1
DERMABOND ADVANCED .7 DNX12 (GAUZE/BANDAGES/DRESSINGS) ×1 IMPLANT
DRAIN JP 10F RND SILICONE (MISCELLANEOUS) IMPLANT
DRAPE 3/4 80X56 (DRAPES) ×1 IMPLANT
DRAPE ARM DVNC X/XI (DISPOSABLE) ×3 IMPLANT
DRAPE COLUMN DVNC XI (DISPOSABLE) ×1 IMPLANT
DRAPE DA VINCI XI ARM (DISPOSABLE) ×4
DRAPE DA VINCI XI COLUMN (DISPOSABLE) ×1
ELECT CAUTERY BLADE 6.4 (BLADE) ×1 IMPLANT
ELECT REM PT RETURN 9FT ADLT (ELECTROSURGICAL) ×1
ELECTRODE REM PT RTRN 9FT ADLT (ELECTROSURGICAL) ×1 IMPLANT
GLOVE BIOGEL PI IND STRL 7.0 (GLOVE) ×2 IMPLANT
GLOVE BIOGEL PI INDICATOR 7.0 (GLOVE) ×3
GLOVE SURG SYN 6.5 ES PF (GLOVE) ×3 IMPLANT
GLOVE SURG SYN 6.5 PF PI (GLOVE) ×2 IMPLANT
GOWN STRL REUS W/ TWL LRG LVL3 (GOWN DISPOSABLE) ×3 IMPLANT
GOWN STRL REUS W/TWL LRG LVL3 (GOWN DISPOSABLE) ×3
GRASPER SUT TROCAR 14GX15 (MISCELLANEOUS) IMPLANT
IRRIGATOR SUCT 8 DISP DVNC XI (IRRIGATION / IRRIGATOR) IMPLANT
IRRIGATOR SUCTION 8MM XI DISP (IRRIGATION / IRRIGATOR)
IV NS 1000ML (IV SOLUTION)
IV NS 1000ML BAXH (IV SOLUTION) IMPLANT
LABEL OR SOLS (LABEL) ×1 IMPLANT
MANIFOLD NEPTUNE II (INSTRUMENTS) ×1 IMPLANT
MESH VENT LT ST 15.2CM CRL (Mesh General) IMPLANT
NDL INSUFFLATION 14GA 120MM (NEEDLE) ×1 IMPLANT
NEEDLE HYPO 22GX1.5 SAFETY (NEEDLE) ×1 IMPLANT
NEEDLE INSUFFLATION 14GA 120MM (NEEDLE) ×1 IMPLANT
OBTURATOR OPTICAL STANDARD 8MM (TROCAR) ×1
OBTURATOR OPTICAL STND 8 DVNC (TROCAR) ×1
OBTURATOR OPTICALSTD 8 DVNC (TROCAR) ×1 IMPLANT
PACK LAP CHOLECYSTECTOMY (MISCELLANEOUS) ×1 IMPLANT
PENCIL SMOKE EVACUATOR (MISCELLANEOUS) ×1 IMPLANT
SEAL CANN UNIV 5-8 DVNC XI (MISCELLANEOUS) ×2 IMPLANT
SEAL XI 5MM-8MM UNIVERSAL (MISCELLANEOUS) ×2
SEALER VESSEL DA VINCI XI (MISCELLANEOUS) ×1
SEALER VESSEL EXT DVNC XI (MISCELLANEOUS) IMPLANT
SET TUBE SMOKE EVAC HIGH FLOW (TUBING) ×1 IMPLANT
SOLUTION ELECTROLUBE (MISCELLANEOUS) ×1 IMPLANT
STAPLER CANNULA SEAL DVNC XI (STAPLE) ×1 IMPLANT
STAPLER CANNULA SEAL XI (STAPLE) ×1
SUT ETHILON 3-0 (SUTURE) IMPLANT
SUT MNCRL 4-0 (SUTURE) ×1
SUT MNCRL 4-0 27 PS-2 XMFL (SUTURE) ×1
SUT MNCRL 4-0 27XMFL (SUTURE) ×1
SUT MNCRL AB 4-0 PS2 18 (SUTURE) ×1 IMPLANT
SUT STRATAFIX 0 PDS+ CT-2 23 (SUTURE)
SUT STRATAFIX PDS 30 CT-1 (SUTURE) IMPLANT
SUT V-LOC 90 ABS 3-0 VLT  V-20 (SUTURE) ×5
SUT V-LOC 90 ABS 3-0 VLT V-20 (SUTURE) IMPLANT
SUT V-LOC 90 ABS DVC 3-0 CL (SUTURE) ×2 IMPLANT
SUT VIC AB 3-0 SH 27 (SUTURE) ×1
SUT VIC AB 3-0 SH 27X BRD (SUTURE) ×1 IMPLANT
SUT VICRYL 0 AB UR-6 (SUTURE) ×1 IMPLANT
SUTURE MNCRL 4-0 27XMF (SUTURE) IMPLANT
SUTURE STRATFX 0 PDS+ CT-2 23 (SUTURE) ×1 IMPLANT
SYR 30ML LL (SYRINGE) ×1 IMPLANT
SYSTEM WECK SHIELD CLOSURE (TROCAR) IMPLANT
TROCAR XCEL NON-BLD 5MMX100MML (ENDOMECHANICALS) IMPLANT

## 2022-08-27 NOTE — Interval H&P Note (Signed)
History and Physical Interval Note:  08/27/2022 12:27 PM  Tiffany [name]  has presented today for surgery, with the diagnosis of Ventral hernia with obstruction and without gangrene K43.6.  The various methods of treatment have been discussed with the patient and family. After consideration of risks, benefits and other options for treatment, the patient has consented to  Procedure(s): XI ROBOTIC ASSISTED VENTRAL HERNIA (N/A) INSERTION OF MESH (N/A) as a surgical intervention.  The patient's history has been reviewed, patient examined, no change in status, stable for surgery.  I have reviewed the patient's chart and labs.  Questions were answered to the patient's satisfaction.    Pt and husband still understand high risk of perioperative complications, risk of early recurrence.  They still wish to proceed   [name]

## 2022-08-27 NOTE — Transfer of Care (Signed)
Immediate Anesthesia Transfer of Care Note  Patient: Tiffany [name]  Procedure(s) Performed: XI ROBOTIC ASSISTED VENTRAL HERNIA (Abdomen) INSERTION OF MESH (Abdomen)  Patient Location: PACU  Anesthesia Type:General  Level of Consciousness: awake  Airway & Oxygen Therapy: Patient Spontanous Breathing and Patient connected to face mask oxygen  Post-op Assessment: Report given to RN and Post -op Vital signs reviewed and stable  Post vital signs: Reviewed and stable  Last Vitals:  Vitals Value Taken Time  BP 179/73 08/27/22 1226  Temp 35.9 C 08/27/22 1221  Pulse 62 08/27/22 1229  Resp 17 08/27/22 1229  SpO2 100 % 08/27/22 1229  Vitals shown include unvalidated device data.  Last Pain:  Vitals:   08/27/22 0902  TempSrc: Oral  PainSc: 0-No pain         Complications: No notable events documented.

## 2022-08-27 NOTE — Anesthesia Preprocedure Evaluation (Signed)
Anesthesia Evaluation  Patient identified by MRN, date of birth, ID band Patient awake    Reviewed: Allergy & Precautions, NPO status , Patient's Chart, lab work & pertinent test results  History of Anesthesia Complications (+) PONV, DIFFICULT AIRWAY and history of anesthetic complications  Airway Mallampati: III  TM Distance: <3 FB Neck ROM: Limited    Dental  (+) Teeth Intact   Pulmonary neg pulmonary ROS,    Pulmonary exam normal  + decreased breath sounds      Cardiovascular Exercise Tolerance: Poor hypertension, Pt. on medications + CAD and +CHF  negative cardio ROS Normal cardiovascular exam+ dysrhythmias Atrial Fibrillation  Rhythm:Regular     Neuro/Psych negative neurological ROS  negative psych ROS   GI/Hepatic negative GI ROS, Neg liver ROS,   Endo/Other  negative endocrine ROSdiabetes, Well Controlled, Type 1  Renal/GU      Musculoskeletal  (+) Arthritis ,   Abdominal (+) + obese,   Peds negative pediatric ROS (+)  Hematology negative hematology ROS (+)   Anesthesia Other Findings Past Medical History: No date: Anemia No date: Aortic atherosclerosis (HCC) No date: Arthritis 02/27/2015: CAD (coronary artery disease)     Comment:  a.) MPI 02/27/2015: EF 62%, small/mild area of septal               hypoperfusion with borderline partial redistribution;               equivocal study; b.) LHC 03/13/2015: LVEDP 16 mmHg; 10%               mLAD, 10% RCA --> no intervention required (med mgmt);               c.) MPI 03/16/2019: EF 56%; normal study. No date: Cataracts, both eyes No date: Cervical disc disease     Comment:  a.) s/p cervical fusion No date: Chickenpox No date: CKD (chronic kidney disease), stage IV (HCC) No date: Complication of anesthesia     Comment:  a.) remote h/o PONV; b.) difficult intubation related to              remote cervical fusion No date: Difficult intubation      Comment:  a.) secondary to remote cervical fusion; reports severe               pain s/p being intubated No date: Dyspnea on exertion No date: HFrEF (heart failure with reduced ejection fraction) ([facility])     Comment:  a.) TTE 01/11/2014: EF >55%, mod LVH, MAC, mild RAE,               mild MR/TR, sev PR; b.) TTE 11/14/2018: EF 40%, sep and               inf HK, mild LAE, triv P, mod MR/TR, G2DD; c.) TTE               11/15/2020: EF 45%, sep HK, mild LVH, triv AR/PR, mild               MR/TR, d.) TTE 10/28/2021: EF 45%, mild glob HK, mild               LVH, triv AR/PR, mild MR, mod TR; e.) TTE 11/06/2021: EF               30-35%, mid apical and anteroseptal HK, mild TR, mod MR,               G2DD. No date:  History of recurrent UTI (urinary tract infection) No date: Hyperlipidemia No date: Hypertension No date: Incontinence in female     Comment:  wears pads No date: LBBB (left bundle branch block) No date: Long term (current) use of anticoagulants     Comment:  a.) rivaroxaban No date: Long-term current use of opiate analgesic     Comment:  a.) hydrocodone/APAP ([place]co) No date: Lumbar disc disease     Comment:  a.) s/p lumbar interbody fusion L2-L5 No date: PAF (paroxysmal atrial fibrillation) (HCC)     Comment:  a.) CHA2DS2VASc = 7 (age x2, sex, HFrEF, HTN, vascular               diasease, T2DM); b.) s/p DCCV 01/12/2017 (150 J x 1) and               11/04/2021 (120 J x 1); c.) rate/rhythm maintained on               oral amiodarone + metoprolol succinate; chronically               anticoagulated with rivaroxaban No date: PONV (postoperative nausea and vomiting)     Comment:  with 1st pregnancy 50 years ago and no problem since               then No date: Right leg weakness No date: Sciatica No date: Spinal stenosis of lumbar region No date: Type 2 diabetes mellitus treated with insulin ([place]) No date: Ventral hernia  Past Surgical History: 11/04/2021: CARDIOVERSION; N/A      Comment:  Procedure: CARDIOVERSION;  Surgeon: Teodoro Spray, MD;              Location: [facility];  Service: Cardiovascular;                Laterality: N/A; No date: CATARACT EXTRACTION, BILATERAL 1996: CERVICAL LAMINECTOMY 12/06/2020: CYSTOSCOPY W/ RETROGRADES     Comment:  Procedure: CYSTOSCOPY WITH RETROGRADE PYELOGRAM;                Surgeon: Billey Co, MD;  Location: [facility];                Service: Urology;; 04/10/2022: Consuela Mimes W/ URETERAL STENT PLACEMENT; Left     Comment:  Procedure: CYSTOSCOPY WITH STENT REPLACEMENT;  Surgeon:               Billey Co, MD;  Location: [facility];  Service:               Urology;  Laterality: Left; 12/06/2020: CYSTOSCOPY WITH STENT PLACEMENT; Left     Comment:  Procedure: CYSTOSCOPY WITH STENT PLACEMENT;  Surgeon:               Billey Co, MD;  Location: [facility];  Service:               Urology;  Laterality: Left; 01/12/2017: ELECTROPHYSIOLOGIC STUDY; N/A     Comment:  Procedure: Cardioversion;  Surgeon: Teodoro Spray, MD;               Location: [facility];  Service: Cardiovascular;                Laterality: N/A; No date: JOINT REPLACEMENT 01/2010: LATERAL FUSION LUMBAR SPINE, TRANSVERSE     Comment:  L2-L5 03/13/2015: LEFT HEART CATH AND CORONARY ANGIOGRAPHY; Left     Comment:  Procedure: LEFT HEART CATH AND CORONARY ANGIOGRAPHY;  Location: [facility]; Surgeon: Bartholome Bill, MD 2010: [place]; Right No date: TONSILLECTOMY 05/23/2019: TOTAL KNEE ARTHROPLASTY; Left     Comment:  Procedure: TOTAL KNEE ARTHROPLASTY - LEFT - DIABETIC;                Surgeon: Corky Mull, MD;  Location: [facility];                Service: Orthopedics;  Laterality: Left; 12/06/2020: URETEROSCOPY; N/A     Comment:  Procedure: DIAGNOSTIC URETEROSCOPY;  Surgeon: Billey Co, MD;  Location: [facility];  Service: Urology;                Laterality: N/A;  BMI    Body Mass Index: 35.67 kg/m       Reproductive/Obstetrics negative OB ROS                             Anesthesia Physical Anesthesia Plan  ASA: 3  Anesthesia Plan: General   Post-op Pain Management:    Induction: Intravenous  PONV Risk Score and Plan: Ondansetron and Dexamethasone  Airway Management Planned: Oral ETT  Additional Equipment:   Intra-op Plan:   Post-operative Plan: Extubation in OR  Informed Consent: I have reviewed the patients History and Physical, chart, labs and discussed the procedure including the risks, benefits and alternatives for the proposed anesthesia with the patient or authorized representative who has indicated his/her understanding and acceptance.     Dental Advisory Given  Plan Discussed with: CRNA and Surgeon  Anesthesia Plan Comments:         Anesthesia Quick Evaluation

## 2022-08-27 NOTE — Anesthesia Postprocedure Evaluation (Signed)
Anesthesia Post Note  Patient: Tiffany [name]  Procedure(s) Performed: XI ROBOTIC ASSISTED VENTRAL HERNIA (Abdomen) INSERTION OF MESH (Abdomen)  Patient location during evaluation: PACU Anesthesia Type: General Level of consciousness: awake Pain management: satisfactory to patient Vital Signs Assessment: post-procedure vital signs reviewed and stable Respiratory status: spontaneous breathing and nonlabored ventilation Cardiovascular status: stable Anesthetic complications: no   No notable events documented.   Last Vitals:  Vitals:   08/27/22 1221 08/27/22 1230  BP: (!) 187/77 (!) 165/73  Pulse: 63 63  Resp: 16 20  Temp: (!) 35.9 C   SpO2: 100% 100%    Last Pain:  Vitals:   08/27/22 1221  TempSrc:   PainSc: Asleep                 VAN STAVEREN,Makih Stefanko

## 2022-08-27 NOTE — Anesthesia Procedure Notes (Signed)
Procedure Name: Intubation Date/Time: 08/27/2022 9:44 AM  Performed by: Loletha Grayer, CRNAPre-anesthesia Checklist: Patient identified, Patient being monitored, Timeout performed, Emergency Drugs available and Suction available Patient Re-evaluated:Patient Re-evaluated prior to induction Oxygen Delivery Method: Circle system utilized Preoxygenation: Pre-oxygenation with 100% oxygen Induction Type: IV induction Ventilation: Mask ventilation without difficulty Laryngoscope Size: 3 and McGraph Grade View: Grade I Tube type: Oral Tube size: 7.0 mm Number of attempts: 1 Airway Equipment and Method: Stylet Placement Confirmation: ETT inserted through vocal cords under direct vision, positive ETCO2 and breath sounds checked- equal and bilateral Secured at: 20 cm Tube secured with: Tape Dental Injury: Teeth and Oropharynx as per pre-operative assessment

## 2022-08-27 NOTE — Op Note (Signed)
Preoperative diagnosis: ventral, initial, reducible hernia Postoperative diagnosis: same  Procedure: Robotic assisted laparoscopic ventral hernia repair with mesh  Anesthesia: general  Surgeon: [name]  Wound Classification: Clean  Specimen: none  Complications: None  Estimated Blood Loss: 73ml  Indications:see HPI  Findings: ventral hernia 6.5cm x 3cm 4. Tension free repair achieved with Bard mesh and suture 5. Adequate hemostasis  Description of procedure: The patient was brought to the operating room and general anesthesia was induced. A time-out was completed verifying correct patient, procedure, site, positioning, and implant(s) and/or special equipment prior to beginning this procedure. Antibiotics were administered prior to making the incision. SCDs placed. The anterior abdominal wall was prepped and draped in the standard sterile fashion.   Palmer's point chosen for entry.  Veress needle placed and abdomen insufflated to 15cm without any dramatic increase in pressure.  Needle removed and optiview technique used to place 20mm port at same point.  No injury noted during placement. Exparel was infused in a TAP block. 3 additional ports, 48mm x2 and 62mm, along lower abdomen.  Xi robot then docked into place.  Arm positioning noted limited range for RLQ port, so additional port placed more lateral to increase ROM.  Docking to this newer port allowed better motion with instruments.  Hernia contents noted and reduced with combination of blunt, sharp dissection with scissors and fenestrated forceps.  Hemostasis achieved throughout this portion.  Once all hernia contents reduced, there was noted to be a 6.5cm x 3cm hernia.    Insufflation dropped to 40mm and transfacial suture with 0 stratafix used to primarily close defect under minimal tension. Bard echo plus protected 15.2cm mesh was placed within the abdominal cavity through 29mm port and secured to the abdominal wall centered over  the defect. The mesh was then circumferentially sutured into the anterior abdominal wall using 3-0 VLock x4.  Any bleeding noted during this portion was no longer actively bleeding by end of securing mesh and tightening the suture.    Robot was undocked.  The 37mm cannula was removed and port site was closed using 0 vicryl suture, ensuring no bowels were injured during this process.  Abdomen then desufflated while camera within abdomen to ensure no signs of new bleed prior to removing camera and rest of ports completely. 10Fr drain placed within Large hernia sac in subq space through stab incision, drain secured with 3-0 nylon. All skin incisions closed with runninrg 4-0 Monocryl in a subcuticular fashion.  All incision sites then dressed with Dermabond, drain site dressed with drain sponge and tape.  Patient was then successfully awakened and transferred to PACU in stable condition.  At the end of the procedure sponge and instrument counts were correct.

## 2022-08-28 ENCOUNTER — Encounter: Payer: Self-pay | Admitting: Surgery

## 2022-08-28 DIAGNOSIS — K439 Ventral hernia without obstruction or gangrene: Secondary | ICD-10-CM | POA: Diagnosis not present

## 2022-08-28 LAB — BASIC METABOLIC PANEL
Anion gap: 8 (ref 5–15)
BUN: 42 mg/dL — ABNORMAL HIGH (ref 8–23)
CO2: 24 mmol/L (ref 22–32)
Calcium: 8.8 mg/dL — ABNORMAL LOW (ref 8.9–10.3)
Chloride: 108 mmol/L (ref 98–111)
Creatinine, Ser: 1.85 mg/dL — ABNORMAL HIGH (ref 0.44–1.00)
GFR, Estimated: 26 mL/min — ABNORMAL LOW (ref >60)
Glucose, Bld: 219 mg/dL — ABNORMAL HIGH (ref 70–99)
Potassium: 4.5 mmol/L (ref 3.5–5.1)
Sodium: 140 mmol/L (ref 135–145)

## 2022-08-28 LAB — CBC
HCT: 39.2 % (ref 36.0–46.0)
Hemoglobin: 12.6 g/dL (ref 12.0–15.0)
MCH: 27.5 pg (ref 26.0–34.0)
MCHC: 32.1 g/dL (ref 30.0–36.0)
MCV: 85.6 fL (ref 80.0–100.0)
Platelets: 238 10*3/uL (ref 150–400)
RBC: 4.58 MIL/uL (ref 3.87–5.11)
RDW: 14.1 % (ref 11.5–15.5)
WBC: 11.2 10*3/uL — ABNORMAL HIGH (ref 4.0–10.5)
nRBC: 0 % (ref 0.0–0.2)

## 2022-08-28 LAB — GLUCOSE, CAPILLARY: Glucose-Capillary: 217 mg/dL — ABNORMAL HIGH (ref 70–99)

## 2022-08-28 MED ORDER — HYDROCODONE-ACETAMINOPHEN 5-325 MG PO TABS: 1.0000 | ORAL_TABLET | Freq: Four times a day (QID) | ORAL | 0 refills | Status: DC | PRN

## 2022-08-28 MED ORDER — ACETAMINOPHEN 325 MG PO TABS: 650.0000 mg | ORAL_TABLET | Freq: Three times a day (TID) | ORAL | 0 refills | Status: AC | PRN

## 2022-08-28 NOTE — Discharge Summary (Signed)
Physician Discharge Summary  Patient ID: Tiffany [name] MRN: [id number] DOB/AGE: 1933/07/31 88 y.o.  Admit date: 08/27/2022 Discharge date: 9/123  Admission Diagnoses: ventral hernia  Discharge Diagnoses:  Same as above  Discharged Condition: good  Hospital Course: admitted for above. Underwent surgery.  Please see op note for details.  Post op, recovered as expected.  At time of d/c, tolerating diet and pain controlled  Consults: None  Discharge Exam: Blood pressure (!) 150/66, pulse 66, temperature 97.8 F (36.6 C), resp. rate 18, height 5\' 2"  (1.575 m), weight 88.5 kg, SpO2 100 %. General appearance: alert, cooperative, and no distress GI: soft, non-tender; bowel sounds normal; no masses,  no organomegaly JP with serosanguinous drainage  Disposition:  Discharge disposition: 01-Home or Self Care        Allergies as of 08/28/2022       Reactions   Amlodipine Swelling   Codeine Nausea And Vomiting   Tolerates low doses hydrocodone   Tizanidine    Didn't feel good taking it   Doxycycline Nausea And Vomiting        Medication List     TAKE these medications    acetaminophen 500 MG tablet Commonly known as: TYLENOL Take 500 mg by mouth every 6 (six) hours as needed (pain). What changed: Another medication with the same name was added. Make sure you understand how and when to take each.   acetaminophen 325 MG tablet Commonly known as: Tylenol Take 2 tablets (650 mg total) by mouth every 8 (eight) hours as needed for mild pain. What changed: You were already taking a medication with the same name, and this prescription was added. Make sure you understand how and when to take each.   amiodarone 200 MG tablet Commonly known as: PACERONE Take 200 mg by mouth daily.   BD Pen Needle Nano U/F 32G X 4 MM Misc Generic drug: Insulin Pen Needle USE WITH INSULIN PEN TWICE DAILY OR AS DIRECTED   cephALEXin 500 MG capsule Commonly known as: KEFLEX Take 1 capsule  (500 mg total) by mouth daily.   glimepiride 2 MG tablet Commonly known as: AMARYL Take 2 mg by mouth daily with breakfast.   HYDROcodone-acetaminophen 5-325 MG tablet Commonly known as: NORCO/VICODIN Take 0.5 tablets by mouth 2 (two) times daily as needed for moderate pain. What changed: Another medication with the same name was added. Make sure you understand how and when to take each.   HYDROcodone-acetaminophen 5-325 MG tablet Commonly known as: Norco Take 1 tablet by mouth every 6 (six) hours as needed for up to 6 doses for moderate pain. What changed: You were already taking a medication with the same name, and this prescription was added. Make sure you understand how and when to take each.   hydrocortisone cream 1 % Apply 1 application  topically 2 (two) times daily as needed (rash).   metoprolol succinate 25 MG 24 hr tablet Commonly known as: TOPROL-XL Take 25 mg by mouth daily.   NovoLOG FlexPen 100 UNIT/ML FlexPen Generic drug: insulin aspart Inject 3-6 Units into the skin daily before supper.   polyethylene glycol 17 g packet Commonly known as: MIRALAX / GLYCOLAX Take 8.5-17 g by mouth daily as needed for moderate constipation.   Precision QID Test test strip Generic drug: glucose blood Use 2 (two) times daily Use as instructed.   predniSONE 5 MG tablet Commonly known as: DELTASONE Take 5-30 mg by mouth See admin instructions. Pt takes 5-30 mg daily as needed  for pain. When a flare occurs take 30 mg the first day then reduce by 5 mg daily until finished (similar to a prednisone dose pack)   Rivaroxaban 15 MG Tabs tablet Commonly known as: XARELTO Take 1 tablet (15 mg total) by mouth daily with breakfast. What changed: Another medication with the same name was removed. Continue taking this medication, and follow the directions you see here. Notes to patient: RESUME IN 48HRS   torsemide 20 MG tablet Commonly known as: DEMADEX Take 0.5 tablets (10 mg total) by  mouth daily.   Toujeo SoloStar 300 UNIT/ML Solostar Pen Generic drug: insulin glargine (1 Unit Dial) Inject 10-14 Units into the skin daily.        Follow-up Information     [place], Johnte Portnoy, DO. Go in 1 week(s).   Specialty: Surgery Why: post op ventral hernia  09/02/2022 at 8:45 AM Contact information: [place] [address] [phone]                  Total time spent arranging discharge was >36min. Signed: [name] 08/28/2022, 7:58 PM

## 2022-08-28 NOTE — TOC CM/SW Note (Signed)
Patient has orders to discharge home today. Chart reviewed. PCP is Ramonita Lab, MD. On room air. Has incision on abdomen (dermabond). No TOC needs identified. CSW signing off.  Dayton Scrape, [place]

## 2022-08-28 NOTE — Discharge Instructions (Addendum)
Hernia repair, Care After This sheet gives you information about how to care for yourself after your procedure. Your health care provider may also give you more specific instructions. If you have problems or questions, contact your health care provider. What can I expect after the procedure? After your procedure, it is common to have the following: Pain in your abdomen, especially in the incision areas. You will be given medicine to control the pain. Tiredness. This is a normal part of the recovery process. Your energy level will return to normal over the next several weeks. Changes in your bowel movements, such as constipation or needing to go more often. Talk with your health care provider about how to manage this. Follow these instructions at home: Medicines RESTART XARELTO IN 48HRS  tylenol as needed for discomfort.    Use narcotics, if prescribed, only when tylenol is not enough to control pain.  325-650mg  every 8hrs to max of 3000mg /24hrs (including the 325mg  in every norco dose) for the tylenol.   PLEASE RECORD NUMBER OF PILLS TAKEN UNTIL NEXT FOLLOW UP APPT.  THIS WILL HELP DETERMINE HOW READY YOU ARE TO BE RELEASED FROM ANY ACTIVITY RESTRICTIONS Do not drive or use heavy machinery while taking prescription pain medicine. Do not drink alcohol while taking prescription pain medicine.  Incision care    Follow instructions from your health care provider about how to take care of your incision areas. Make sure you: Keep your incisions clean and dry. Wash your hands with soap and water before and after applying medicine to the areas, and before and after changing your bandage (dressing). If soap and water are not available, use hand sanitizer. Change your dressing as told by your health care provider. Leave stitches (sutures), skin glue, or adhesive strips in place. These skin closures may need to stay in place for 2 weeks or longer. If adhesive strip edges start to loosen and curl up, you  may trim the loose edges. Do not remove adhesive strips completely unless your health care provider tells you to do that. Do not wear tight clothing over the incisions. Tight clothing may rub and irritate the incision areas, which may cause the incisions to open. Do not take baths, swim, or use a hot tub until your health care provider approves. OK TO SHOWER IN 24HRS.   Check your incision area every day for signs of infection. Check for: More redness, swelling, or pain. More fluid or blood. Warmth. Pus or a bad smell. Activity Avoid lifting anything that is heavier than 10 lb (4.5 kg) for 2 weeks or until your health care provider says it is okay. No pushing/pulling greater than 30lbs You may resume normal activities as told by your health care provider. Ask your health care provider what activities are safe for you. Take rest breaks during the day as needed. Eating and drinking Follow instructions from your health care provider about what you can eat after surgery. To prevent or treat constipation while you are taking prescription pain medicine, your health care provider may recommend that you: Drink enough fluid to keep your urine clear or pale yellow. Take over-the-counter or prescription medicines. Eat foods that are high in fiber, such as fresh fruits and vegetables, whole grains, and beans. Limit foods that are high in fat and processed sugars, such as fried and sweet foods. General instructions Ask your health care provider when you will need an appointment to get your sutures or staples removed. Keep all follow-up visits as told  by your health care provider. This is important. Contact a health care provider if: You have more redness, swelling, or pain around your incisions. You have more fluid or blood coming from the incisions. Your incisions feel warm to the touch. You have pus or a bad smell coming from your incisions or your dressing. You have a fever. You have an incision  that breaks open (edges not staying together) after sutures or staples have been removed. You develop a rash. You have chest pain or difficulty breathing. You have pain or swelling in your legs. You feel light-headed or you faint. Your abdomen swells (becomes distended). You have nausea or vomiting. You have blood in your stool (feces). This information is not intended to replace advice given to you by your health care provider. Make sure you discuss any questions you have with your health care provider. Document Released: 07/03/2005 Document Revised: 09/02/2018 Document Reviewed: 09/14/2016 Elsevier Interactive Patient Education  2019 [employer].

## 2022-09-01 ENCOUNTER — Encounter: Payer: Self-pay | Admitting: Surgery

## 2022-09-02 DIAGNOSIS — K436 Other and unspecified ventral hernia with obstruction, without gangrene: Secondary | ICD-10-CM | POA: Diagnosis not present

## 2022-09-24 DIAGNOSIS — Z79899 Other long term (current) drug therapy: Secondary | ICD-10-CM | POA: Diagnosis not present

## 2022-09-24 DIAGNOSIS — E782 Mixed hyperlipidemia: Secondary | ICD-10-CM | POA: Diagnosis not present

## 2022-09-24 DIAGNOSIS — Z794 Long term (current) use of insulin: Secondary | ICD-10-CM | POA: Diagnosis not present

## 2022-09-24 DIAGNOSIS — E1122 Type 2 diabetes mellitus with diabetic chronic kidney disease: Secondary | ICD-10-CM | POA: Diagnosis not present

## 2022-09-24 DIAGNOSIS — I480 Paroxysmal atrial fibrillation: Secondary | ICD-10-CM | POA: Diagnosis not present

## 2022-09-24 DIAGNOSIS — I447 Left bundle-branch block, unspecified: Secondary | ICD-10-CM | POA: Diagnosis not present

## 2022-09-24 DIAGNOSIS — I10 Essential (primary) hypertension: Secondary | ICD-10-CM | POA: Diagnosis not present

## 2022-09-24 DIAGNOSIS — N184 Chronic kidney disease, stage 4 (severe): Secondary | ICD-10-CM | POA: Diagnosis not present

## 2022-10-22 ENCOUNTER — Other Ambulatory Visit: Payer: Self-pay | Admitting: Urology

## 2022-10-22 ENCOUNTER — Ambulatory Visit: Payer: PPO | Admitting: Urology

## 2022-10-22 ENCOUNTER — Encounter: Payer: Self-pay | Admitting: Urology

## 2022-10-22 VITALS — BP 186/76 | HR 61 | Ht 62.0 in | Wt 194.0 lb

## 2022-10-22 DIAGNOSIS — N184 Chronic kidney disease, stage 4 (severe): Secondary | ICD-10-CM

## 2022-10-22 DIAGNOSIS — N1339 Other hydronephrosis: Secondary | ICD-10-CM

## 2022-10-22 DIAGNOSIS — N1330 Unspecified hydronephrosis: Secondary | ICD-10-CM

## 2022-10-22 MED ORDER — CEPHALEXIN 500 MG PO CAPS
500.0000 mg | ORAL_CAPSULE | Freq: Every day | ORAL | 3 refills | Status: DC
Start: 2022-10-22 — End: 2023-03-12

## 2022-10-22 NOTE — Progress Notes (Signed)
Surgical Physician Order [facility] [place]  * Scheduling expectation :~ July  2024  *Length of Case: 30 minutes  *Clearance needed: no  *Anticoagulation Instructions: May continue all anticoagulants  *Aspirin Instructions: Ok to continue all  *Post-op visit Date/Instructions:   TBD  *Diagnosis: Left Hydronephrosis  *Procedure: left  Cysto w/stent exchange (86381)   Additional orders: N/A  -Admit type: OUTpatient  -Anesthesia: MAC  -VTE Prophylaxis Standing Order SCD's       Other:   -Standing Lab Orders Per Anesthesia    Lab other: UA&Urine Culture  -Standing Test orders EKG/Chest x-ray per Anesthesia       Test other:   - Medications:  Ancef 2gm IV  -Other orders:  N/A

## 2022-10-22 NOTE — Progress Notes (Signed)
   10/22/2022 1:56 PM   Tiffany [name] Feb 03, 1933 [id number]  Reason for visit: Follow up left hydronephrosis, CKD IV  HPI: 88 year old with CKD and left hydronephrosis of unknown etiology, she underwent a diagnostic ureteroscopy in December 2021 that showed no evidence of obstruction and a stent was placed, and her renal function improved.  Stent has been changed every 12 to 18 months, and renal function has been stable, most recently 1.85(eGFR 26) in September 2023 from a peak creatinine of 2.9(eGFR 15) when originally found to have hydronephrosis.  Her baseline creatinine is 1.8(eGFR 27).   She may also have a component of incomplete bladder emptying that may cause some intermittent reflux and intermittent hydronephrosis of the ureteral stent.  I previously had recommended a renal ultrasound with a Foley in place, but her husband removed her Foley catheter at home prior to having the renal ultrasound done.  She has remained on Keflex prophylaxis through PCP, and denies any UTIs over the last 6 months.  She denies any urinary symptoms today.  Urinalysis in August with PCP was benign aside from microscopic hematuria with 4-10 RBC.  They opted to continue chronic stent exchanges every 12 to 18 months.  We discussed the risk of encrustation.  Using shared decision making we opted for planned left ureteral stent change in July 2024.  Left ureteral stent exchange in July 2024 Continue low-dose antibiotic prophylaxis    Billey Co, MD  [facility] [address], [address] [place], [place] [address] [phone]

## 2022-10-28 DIAGNOSIS — Z794 Long term (current) use of insulin: Secondary | ICD-10-CM | POA: Diagnosis not present

## 2022-10-28 DIAGNOSIS — I480 Paroxysmal atrial fibrillation: Secondary | ICD-10-CM | POA: Diagnosis not present

## 2022-10-28 DIAGNOSIS — E782 Mixed hyperlipidemia: Secondary | ICD-10-CM | POA: Diagnosis not present

## 2022-10-28 DIAGNOSIS — N184 Chronic kidney disease, stage 4 (severe): Secondary | ICD-10-CM | POA: Diagnosis not present

## 2022-10-28 DIAGNOSIS — Z79891 Long term (current) use of opiate analgesic: Secondary | ICD-10-CM | POA: Diagnosis not present

## 2022-10-28 DIAGNOSIS — E1122 Type 2 diabetes mellitus with diabetic chronic kidney disease: Secondary | ICD-10-CM | POA: Diagnosis not present

## 2022-11-04 DIAGNOSIS — I5022 Chronic systolic (congestive) heart failure: Secondary | ICD-10-CM | POA: Diagnosis not present

## 2022-11-04 DIAGNOSIS — D649 Anemia, unspecified: Secondary | ICD-10-CM | POA: Diagnosis not present

## 2022-11-04 DIAGNOSIS — I10 Essential (primary) hypertension: Secondary | ICD-10-CM | POA: Diagnosis not present

## 2022-11-04 DIAGNOSIS — E1122 Type 2 diabetes mellitus with diabetic chronic kidney disease: Secondary | ICD-10-CM | POA: Diagnosis not present

## 2022-11-04 DIAGNOSIS — N184 Chronic kidney disease, stage 4 (severe): Secondary | ICD-10-CM | POA: Diagnosis not present

## 2022-11-04 DIAGNOSIS — D6869 Other thrombophilia: Secondary | ICD-10-CM | POA: Diagnosis not present

## 2022-11-04 DIAGNOSIS — I480 Paroxysmal atrial fibrillation: Secondary | ICD-10-CM | POA: Diagnosis not present

## 2022-11-04 DIAGNOSIS — E782 Mixed hyperlipidemia: Secondary | ICD-10-CM | POA: Diagnosis not present

## 2022-11-04 DIAGNOSIS — M48061 Spinal stenosis, lumbar region without neurogenic claudication: Secondary | ICD-10-CM | POA: Diagnosis not present

## 2022-11-04 DIAGNOSIS — Z794 Long term (current) use of insulin: Secondary | ICD-10-CM | POA: Diagnosis not present

## 2022-11-04 DIAGNOSIS — N2581 Secondary hyperparathyroidism of renal origin: Secondary | ICD-10-CM | POA: Diagnosis not present

## 2022-11-23 ENCOUNTER — Telehealth: Payer: Self-pay

## 2022-11-23 NOTE — Telephone Encounter (Signed)
I spoke with Mrs. Tiffany [name]. We have discussed possible surgery dates and Friday July 12th, 2024 was agreed upon by all parties. Patient given information about surgery date, what to expect pre-operatively and [name] operatively.  We discussed that a Pre-Admission Testing office will be calling to set up the pre-op visit that will take place prior to surgery, and that these appointments are typically done over the phone with a Pre-Admissions RN. Informed patient that our office will communicate any additional care to be provided after surgery. Patients questions or concerns were discussed during our call. Advised to call our office should there be any additional information, questions or concerns that arise. Patient verbalized understanding.

## 2022-11-23 NOTE — Progress Notes (Signed)
   [facility] From  Surgery Date: Date: 07/09/2023  Surgeon: Dr. Nickolas Madrid, MD  Inpt ( No  )   Outpt (Yes)   Obs ( No  )   Diagnosis: N13.30 Hydronephrosis of Left Kidney  -CPT: 89483  Surgery: Left Cystoscopy with Stent Exchange  Stop Anticoagulations: No, may continue all  Cardiac/Medical/Pulmonary Clearance needed: no  *Orders entered into EPIC  Date: 11/23/22   *Case booked in [place]  Date: 11/17/2022  *Notified pt of Surgery: Date: 11/17/2022  PRE-OP UA & CX: yes, will obtain in clinic on 06/29/2023  *Placed into Prior Authorization Work Fabio Bering Date: 11/23/22  Assistant/laser/rep:No

## 2023-01-21 ENCOUNTER — Telehealth: Payer: Self-pay | Admitting: *Deleted

## 2023-01-21 NOTE — Progress Notes (Signed)
  Care Coordination   Note   01/21/2023 Name: Keyetta Hollingworth MRN: [id number] DOB: 10/08/1933  Stormi Vandevelde is a 89 y.o. year old female who sees Adin Hector, MD for primary care. I reached out to [name] by phone today to offer care coordination services.  Ms. Mesta was given information about Care Coordination services today including:   The Care Coordination services include support from the care team which includes your Nurse Coordinator, Clinical Social Worker, or Pharmacist.  The Care Coordination team is here to help remove barriers to the health concerns and goals most important to you. Care Coordination services are voluntary, and the patient may decline or stop services at any time by request to their care team member.   Care Coordination Consent Status: Patient agreed to services and verbal consent obtained.   Follow up plan:  Telephone appointment with care coordination team member scheduled for:  01/26/2023  Encounter Outcome:  Pt. Scheduled  Julian Hy, [place] Direct Dial: [phone]

## 2023-01-26 ENCOUNTER — Encounter: Payer: Self-pay | Admitting: *Deleted

## 2023-01-26 ENCOUNTER — Ambulatory Visit: Payer: Self-pay | Admitting: *Deleted

## 2023-01-26 NOTE — Patient Instructions (Signed)
Visit Information  Thank you for taking time to visit with me today. Please don't hesitate to contact me if I can be of assistance to you.  Following are the goals we discussed today:  Call insurance customer service number to get list of cardiologists in network.   Please call the Suicide and Crisis Lifeline: 988 call the [place] National Suicide Prevention Lifeline: 534 812 0436 or TTY: 929-875-6476 TTY (720)308-9754) to talk to a trained counselor call 1-800-273-TALK (toll free, 24 hour hotline) call 911 if you are experiencing a Mental Health or [place] or need someone to talk to.  Patient verbalizes understanding of instructions and care plan provided today and agrees to view in [place]. Active MyChart status and patient understanding of how to access instructions and care plan via MyChart confirmed with patient.     The patient has been provided with contact information for the care management team and has been advised to call with any health related questions or concerns.   Valente David, RN, MSN, [place] Care Management Care Management Coordinator [phone]

## 2023-01-26 NOTE — Patient Outreach (Signed)
  Care Coordination   Initial Visit Note   01/26/2023 Name: Tiffany [name] MRN: [id number] DOB: Oct 17, 1933  Tiffany [name] is a 89 y.o. year old female who sees Tiffany Hector, MD for primary care. I spoke with  Tiffany [name] by phone today.  What matters to the patients health and wellness today?  Patient report she is doing very well caring for chronic conditions.  A1C less than 7, blood pressure controlled, has had flu and covid vaccinations. She does report the need for new cardiologist as hers has retired, but her husband report he will help with that.  They will call insurance company for providers in network and he will check with his colleagues as he is a retired Engineer, drilling.  Denies any urgent concerns, encouraged to contact this care manager with questions.  Does not feel follow up is needed, will call with questions.     Goals Addressed             This Visit's Progress    COMPLETED: Care Coordination Activities - No follow up needed       Care Coordination Interventions: Evaluation of current treatment plan related to chronic health condition management and patient's adherence to plan as established by provider Reviewed medications with patient and discussed affordability and adherence Reviewed scheduled/upcoming provider appointments including PCP on 2/14 Discussed plans with patient for ongoing care management follow up and provided patient with direct contact information for care management team Screening for signs and symptoms of depression related to chronic disease state  Assessed social determinant of health barriers         SDOH assessments and interventions completed:  Yes  SDOH Interventions Today    Flowsheet Row Most Recent Value  SDOH Interventions   Food Insecurity Interventions Intervention Not Indicated  Housing Interventions Intervention Not Indicated  Transportation Interventions Intervention Not Indicated        Care Coordination  Interventions:  Yes, provided   Follow up plan: No further intervention required.   Encounter Outcome:  Pt. Visit Completed   Tiffany David, RN, MSN, [place] Care Management Care Management Coordinator [phone]

## 2023-02-03 DIAGNOSIS — E1122 Type 2 diabetes mellitus with diabetic chronic kidney disease: Secondary | ICD-10-CM | POA: Diagnosis not present

## 2023-02-03 DIAGNOSIS — N2581 Secondary hyperparathyroidism of renal origin: Secondary | ICD-10-CM | POA: Diagnosis not present

## 2023-02-03 DIAGNOSIS — E782 Mixed hyperlipidemia: Secondary | ICD-10-CM | POA: Diagnosis not present

## 2023-02-03 DIAGNOSIS — Z794 Long term (current) use of insulin: Secondary | ICD-10-CM | POA: Diagnosis not present

## 2023-02-03 DIAGNOSIS — D6869 Other thrombophilia: Secondary | ICD-10-CM | POA: Diagnosis not present

## 2023-02-03 DIAGNOSIS — N184 Chronic kidney disease, stage 4 (severe): Secondary | ICD-10-CM | POA: Diagnosis not present

## 2023-02-03 DIAGNOSIS — I10 Essential (primary) hypertension: Secondary | ICD-10-CM | POA: Diagnosis not present

## 2023-02-23 DIAGNOSIS — N2581 Secondary hyperparathyroidism of renal origin: Secondary | ICD-10-CM | POA: Insufficient documentation

## 2023-02-24 DIAGNOSIS — E1122 Type 2 diabetes mellitus with diabetic chronic kidney disease: Secondary | ICD-10-CM | POA: Diagnosis not present

## 2023-02-24 DIAGNOSIS — M5416 Radiculopathy, lumbar region: Secondary | ICD-10-CM | POA: Diagnosis not present

## 2023-02-24 DIAGNOSIS — N184 Chronic kidney disease, stage 4 (severe): Secondary | ICD-10-CM | POA: Diagnosis not present

## 2023-02-24 DIAGNOSIS — D649 Anemia, unspecified: Secondary | ICD-10-CM | POA: Diagnosis not present

## 2023-02-24 DIAGNOSIS — E782 Mixed hyperlipidemia: Secondary | ICD-10-CM | POA: Diagnosis not present

## 2023-02-24 DIAGNOSIS — R300 Dysuria: Secondary | ICD-10-CM | POA: Diagnosis not present

## 2023-02-24 DIAGNOSIS — N2581 Secondary hyperparathyroidism of renal origin: Secondary | ICD-10-CM | POA: Diagnosis not present

## 2023-02-24 DIAGNOSIS — I5022 Chronic systolic (congestive) heart failure: Secondary | ICD-10-CM | POA: Diagnosis not present

## 2023-02-24 DIAGNOSIS — D6869 Other thrombophilia: Secondary | ICD-10-CM | POA: Diagnosis not present

## 2023-02-24 DIAGNOSIS — I10 Essential (primary) hypertension: Secondary | ICD-10-CM | POA: Diagnosis not present

## 2023-02-24 DIAGNOSIS — I480 Paroxysmal atrial fibrillation: Secondary | ICD-10-CM | POA: Diagnosis not present

## 2023-02-26 DIAGNOSIS — Z7901 Long term (current) use of anticoagulants: Secondary | ICD-10-CM | POA: Diagnosis not present

## 2023-02-26 DIAGNOSIS — I5022 Chronic systolic (congestive) heart failure: Secondary | ICD-10-CM | POA: Diagnosis not present

## 2023-02-26 DIAGNOSIS — N184 Chronic kidney disease, stage 4 (severe): Secondary | ICD-10-CM | POA: Diagnosis not present

## 2023-02-26 DIAGNOSIS — Z794 Long term (current) use of insulin: Secondary | ICD-10-CM | POA: Diagnosis not present

## 2023-02-26 DIAGNOSIS — I447 Left bundle-branch block, unspecified: Secondary | ICD-10-CM | POA: Diagnosis not present

## 2023-02-26 DIAGNOSIS — I428 Other cardiomyopathies: Secondary | ICD-10-CM | POA: Diagnosis not present

## 2023-02-26 DIAGNOSIS — E119 Type 2 diabetes mellitus without complications: Secondary | ICD-10-CM | POA: Diagnosis not present

## 2023-02-26 DIAGNOSIS — I4819 Other persistent atrial fibrillation: Secondary | ICD-10-CM | POA: Diagnosis not present

## 2023-02-26 DIAGNOSIS — Z79899 Other long term (current) drug therapy: Secondary | ICD-10-CM | POA: Diagnosis not present

## 2023-02-26 DIAGNOSIS — E782 Mixed hyperlipidemia: Secondary | ICD-10-CM | POA: Diagnosis not present

## 2023-02-26 DIAGNOSIS — I10 Essential (primary) hypertension: Secondary | ICD-10-CM | POA: Diagnosis not present

## 2023-03-05 ENCOUNTER — Telehealth: Payer: Self-pay | Admitting: *Deleted

## 2023-03-05 ENCOUNTER — Other Ambulatory Visit: Payer: Self-pay | Admitting: Urology

## 2023-03-05 NOTE — Progress Notes (Signed)
Surgical Physician Order Form [facility]  * Scheduling expectation :  April 2024  *Length of Case: 30 minutes  *Clearance needed: no  *Anticoagulation Instructions: May continue all anticoagulants  *Aspirin Instructions: Ok to continue all  *Post-op visit Date/Instructions:   tbd  *Diagnosis: Left Hydronephrosis  *Procedure: left  Cysto w/stent exchange [id number])   Additional orders: N/A  -Admit type: OUTpatient  -Anesthesia: General  -VTE Prophylaxis Standing Order SCD's       Other:   -Standing Lab Orders Per Anesthesia    Lab other: UA&Urine Culture  -Standing Test orders EKG/Chest x-ray per Anesthesia       Test other:   - Medications:   Pending culture results  -Other orders:  N/A

## 2023-03-05 NOTE — Telephone Encounter (Signed)
Husband called in today and states Tiffany [name] is been having pain in her left flank off and on . He was asking if you think she can get her stent exchange earlier. She was on some antibiotics from her pcp. She also have appt for a Cardioversion on 03/17/2023 . He would like your advise

## 2023-03-08 ENCOUNTER — Other Ambulatory Visit: Payer: Self-pay | Admitting: Urology

## 2023-03-08 DIAGNOSIS — N1339 Other hydronephrosis: Secondary | ICD-10-CM

## 2023-03-08 MED ORDER — CIPROFLOXACIN HCL 250 MG PO TABS: 250.0000 mg | ORAL_TABLET | Freq: Two times a day (BID) | ORAL | 0 refills | Status: DC

## 2023-03-08 NOTE — Telephone Encounter (Signed)
Spoke with patient's husband Mikki Santee) today whom was very upset on the phone. Demanding to speak with Dr. Diamantina Providence, he only wants Dr. Diamantina Providence to call him, is asking for professional courtesy. I offered the patients husband an appt in person or via MyChart to discuss her symptoms further face to face, which he declined until he receives a call from Dr. Diamantina Providence. Advised that Dr. Diamantina Providence is in the OR today, in which that patient's husband said "ok, well he can call after". Patient states that he was very disappointed that this wasn't addressed Friday, in which I did apologize that I was out of the clinic on Friday. He also said he sent a MyChart message on Saturday and received no answer from this.

## 2023-03-08 NOTE — Telephone Encounter (Signed)
See phone note in the chart. Melissa advised and will speak with patient

## 2023-03-08 NOTE — Telephone Encounter (Signed)
89 year old very comorbid female with CKD, chronic left hydronephrosis managed with a left ureteral stent that is changed yearly.  History obtained from patient and her husband.  She reports about 2 weeks of left-sided flank pain, this seems to be worse with urinating.  She is having some dysuria.  Renal function has been stable.  She had a urinalysis on 2/28 that was relatively benign, however culture ultimately grew Klebsiella.  She was treated with culture appropriate Bactrim, but has not had any significant change in her symptoms.  She denies any fevers or chills.  We reviewed possible etiologies of her left-sided flank pain including stent encrustation, UTI, or musculoskeletal.  I think it is reasonable to trial Cipro 250 mg twice daily x 5 days, order a renal ultrasound to evaluate for any changes in her hydronephrosis, and check renal function this week.  I will follow-up those results, anticipate moving stent change up to the next few weeks, from previously scheduled in June.  Nickolas Madrid, MD 03/08/2023

## 2023-03-09 NOTE — Progress Notes (Signed)
We will wait until Renal Scan results to possibly schedule. Will continue to monitor.

## 2023-03-10 ENCOUNTER — Ambulatory Visit
Admission: RE | Admit: 2023-03-10 | Discharge: 2023-03-10 | Disposition: A | Discharge status: Home / Self Care | Payer: PPO | Source: Ambulatory Visit | Attending: Urology | Admitting: Urology

## 2023-03-10 DIAGNOSIS — N1339 Other hydronephrosis: Secondary | ICD-10-CM | POA: Diagnosis not present

## 2023-03-10 DIAGNOSIS — I5022 Chronic systolic (congestive) heart failure: Secondary | ICD-10-CM | POA: Diagnosis not present

## 2023-03-10 DIAGNOSIS — N1330 Unspecified hydronephrosis: Secondary | ICD-10-CM | POA: Diagnosis not present

## 2023-03-11 ENCOUNTER — Telehealth: Payer: Self-pay | Admitting: Urology

## 2023-03-11 ENCOUNTER — Other Ambulatory Visit: Payer: Self-pay | Admitting: Urology

## 2023-03-11 MED ORDER — CEFAZOLIN SODIUM-DEXTROSE 2-4 GM/100ML-% IV SOLN: 2.0000 g | INTRAVENOUS | Status: DC

## 2023-03-11 NOTE — Telephone Encounter (Signed)
Urology telephone note  89 year old female with CKD, left hydronephrosis of unclear etiology managed with chronic left ureteral stent, reports worsening flank pain over the last few weeks as well as worsened renal function with creatinine yesterday 3.9 from 2.3.  Renal ultrasound yesterday shows left hydronephrosis.  PVR on bladder ultrasound 250m, and she has a known component of incomplete bladder emptying.  I had a conversation this morning with patient and her husband about these findings.  We discussed the challenges of determining if this is reflux up the stent versus obstruction.  I had previously recommended a repeat renal ultrasound with a catheter in place but her catheter was removed prior to that ultrasound being completed.  Most importantly, when she originally presented with hydronephrosis in 2021, renal function improved back to baseline after placement of the ureteral stent.  We reviewed options including Foley catheter placement or stent change, and they are interested in stent change.  Will schedule for tomorrow 03/12/2023.  Recent urine culture 02/24/2023 with Klebsiella, was treated with culture appropriate Bactrim, had some persistent dysuria and was transition to Cipro.  Schedule cystoscopy and left ureteral stent change 3/15  BNickolas Madrid MD 03/11/2023

## 2023-03-11 NOTE — Progress Notes (Signed)
Surgical Physician Order Form [facility]  * Scheduling expectation :  03/12/2023  *Length of Case: 30 minutes  *Clearance needed: no  *Anticoagulation Instructions: May continue all anticoagulants  *Aspirin Instructions: Ok to continue all  *Post-op visit Date/Instructions:   tbd  *Diagnosis: Left Hydronephrosis  *Procedure: left  Cysto w/stent exchange [id number])   Additional orders: N/A  -Admit type: OUTpatient  -Anesthesia: MAC  -VTE Prophylaxis Standing Order SCD's       Other:   -Standing Lab Orders Per Anesthesia    Lab other: None  -Standing Test orders EKG/Chest x-ray per Anesthesia       Test other:   - Medications:  Cipro '400mg'$  IV  -Other orders:  N/A

## 2023-03-12 ENCOUNTER — Ambulatory Visit: Payer: PPO | Admitting: Anesthesiology

## 2023-03-12 ENCOUNTER — Encounter
Admission: RE | Disposition: A | Discharge status: Home / Self Care | Payer: Self-pay | Source: Home / Self Care | Attending: Urology

## 2023-03-12 ENCOUNTER — Ambulatory Visit
Admission: RE | Admit: 2023-03-12 | Discharge: 2023-03-12 | Disposition: A | Discharge status: Home / Self Care | Payer: PPO | Attending: Urology | Admitting: Urology

## 2023-03-12 ENCOUNTER — Encounter: Payer: Self-pay | Admitting: Urology

## 2023-03-12 ENCOUNTER — Ambulatory Visit: Payer: PPO

## 2023-03-12 DIAGNOSIS — I2510 Atherosclerotic heart disease of native coronary artery without angina pectoris: Secondary | ICD-10-CM | POA: Diagnosis not present

## 2023-03-12 DIAGNOSIS — N1330 Unspecified hydronephrosis: Secondary | ICD-10-CM | POA: Diagnosis not present

## 2023-03-12 DIAGNOSIS — I272 Pulmonary hypertension, unspecified: Secondary | ICD-10-CM | POA: Diagnosis not present

## 2023-03-12 DIAGNOSIS — E1022 Type 1 diabetes mellitus with diabetic chronic kidney disease: Secondary | ICD-10-CM | POA: Insufficient documentation

## 2023-03-12 DIAGNOSIS — N3289 Other specified disorders of bladder: Secondary | ICD-10-CM | POA: Diagnosis not present

## 2023-03-12 DIAGNOSIS — N184 Chronic kidney disease, stage 4 (severe): Secondary | ICD-10-CM | POA: Insufficient documentation

## 2023-03-12 DIAGNOSIS — I480 Paroxysmal atrial fibrillation: Secondary | ICD-10-CM | POA: Insufficient documentation

## 2023-03-12 DIAGNOSIS — I5022 Chronic systolic (congestive) heart failure: Secondary | ICD-10-CM | POA: Insufficient documentation

## 2023-03-12 DIAGNOSIS — I130 Hypertensive heart and chronic kidney disease with heart failure and stage 1 through stage 4 chronic kidney disease, or unspecified chronic kidney disease: Secondary | ICD-10-CM | POA: Insufficient documentation

## 2023-03-12 HISTORY — PX: CYSTOSCOPY W/ URETERAL STENT PLACEMENT: SHX1429

## 2023-03-12 LAB — GLUCOSE, CAPILLARY
Glucose-Capillary: 102 mg/dL — ABNORMAL HIGH (ref 70–99)
Glucose-Capillary: 111 mg/dL — ABNORMAL HIGH (ref 70–99)

## 2023-03-12 SURGERY — CYSTOSCOPY, FLEXIBLE, WITH STENT REPLACEMENT
Anesthesia: General | Laterality: Left

## 2023-03-12 MED ORDER — PROPOFOL 500 MG/50ML IV EMUL
INTRAVENOUS | Status: DC | PRN
  Administered 2023-03-12: 50 ug/kg/min via INTRAVENOUS

## 2023-03-12 MED ORDER — CIPROFLOXACIN IN D5W 400 MG/200ML IV SOLN
INTRAVENOUS | Status: DC | PRN
  Administered 2023-03-12: 400 mg via INTRAVENOUS

## 2023-03-12 MED ORDER — SODIUM CHLORIDE 0.9 % IV SOLN: INTRAVENOUS | Status: DC

## 2023-03-12 MED ORDER — FENTANYL CITRATE (PF) 100 MCG/2ML IJ SOLN
INTRAMUSCULAR | Status: DC | PRN
  Administered 2023-03-12: 25 ug via INTRAVENOUS

## 2023-03-12 MED ORDER — SODIUM CHLORIDE 0.9 % IR SOLN
Status: DC | PRN
  Administered 2023-03-12: 1000 mL via INTRAVESICAL

## 2023-03-12 MED ORDER — LIDOCAINE HCL (PF) 2 % IJ SOLN
INTRAMUSCULAR | Status: AC
  Filled 2023-03-12: qty 5

## 2023-03-12 MED ORDER — CIPROFLOXACIN IN D5W 400 MG/200ML IV SOLN
INTRAVENOUS | Status: AC
  Filled 2023-03-12: qty 200

## 2023-03-12 MED ORDER — FENTANYL CITRATE (PF) 100 MCG/2ML IJ SOLN: 25.0000 ug | INTRAMUSCULAR | Status: DC | PRN

## 2023-03-12 MED ORDER — ONDANSETRON HCL 4 MG/2ML IJ SOLN
INTRAMUSCULAR | Status: DC | PRN
  Administered 2023-03-12: 4 mg via INTRAVENOUS

## 2023-03-12 MED ORDER — LIDOCAINE HCL (CARDIAC) PF 100 MG/5ML IV SOSY
PREFILLED_SYRINGE | INTRAVENOUS | Status: DC | PRN
  Administered 2023-03-12: 20 mg via INTRAVENOUS

## 2023-03-12 MED ORDER — ORAL CARE MOUTH RINSE: 15.0000 mL | Freq: Once | OROMUCOSAL | Status: AC

## 2023-03-12 MED ORDER — PROPOFOL 10 MG/ML IV BOLUS
INTRAVENOUS | Status: AC
  Filled 2023-03-12: qty 20

## 2023-03-12 MED ORDER — FENTANYL CITRATE (PF) 100 MCG/2ML IJ SOLN
INTRAMUSCULAR | Status: AC
  Filled 2023-03-12: qty 2

## 2023-03-12 MED ORDER — ONDANSETRON HCL 4 MG/2ML IJ SOLN
INTRAMUSCULAR | Status: AC
  Filled 2023-03-12: qty 2

## 2023-03-12 MED ORDER — CEFAZOLIN SODIUM-DEXTROSE 2-4 GM/100ML-% IV SOLN
INTRAVENOUS | Status: AC
  Filled 2023-03-12: qty 100

## 2023-03-12 MED ORDER — CHLORHEXIDINE GLUCONATE 0.12 % MT SOLN: 15.0000 mL | Freq: Once | OROMUCOSAL | Status: AC

## 2023-03-12 MED ORDER — OXYCODONE HCL 5 MG/5ML PO SOLN: 5.0000 mg | Freq: Once | ORAL | Status: DC | PRN

## 2023-03-12 MED ORDER — OXYCODONE HCL 5 MG PO TABS: 5.0000 mg | ORAL_TABLET | Freq: Once | ORAL | Status: DC | PRN

## 2023-03-12 MED ORDER — CHLORHEXIDINE GLUCONATE 0.12 % MT SOLN
OROMUCOSAL | Status: AC
  Administered 2023-03-12: 15 mL via OROMUCOSAL
  Filled 2023-03-12: qty 15

## 2023-03-12 MED ORDER — PROPOFOL 10 MG/ML IV BOLUS
INTRAVENOUS | Status: DC | PRN
  Administered 2023-03-12: 20 mg via INTRAVENOUS

## 2023-03-12 SURGICAL SUPPLY — 25 items
BAG DRAIN SIEMENS DORNER NS (MISCELLANEOUS) ×1 IMPLANT
BAG DRN NS LF (MISCELLANEOUS) ×1
BRUSH SCRUB EZ 1% IODOPHOR (MISCELLANEOUS) IMPLANT
CATH URETL OPEN 5X70 (CATHETERS) ×1 IMPLANT
GAUZE 4X4 16PLY ~~LOC~~+RFID DBL (SPONGE) ×2 IMPLANT
GLOVE SURG UNDER POLY LF SZ7.5 (GLOVE) ×1 IMPLANT
GOWN STRL REUS W/ TWL LRG LVL3 (GOWN DISPOSABLE) ×1 IMPLANT
GOWN STRL REUS W/ TWL XL LVL3 (GOWN DISPOSABLE) ×1 IMPLANT
GOWN STRL REUS W/TWL LRG LVL3 (GOWN DISPOSABLE) ×1
GOWN STRL REUS W/TWL XL LVL3 (GOWN DISPOSABLE) ×1
GUIDEWIRE STR DUAL SENSOR (WIRE) ×1 IMPLANT
IV NS IRRIG 3000ML ARTHROMATIC (IV SOLUTION) ×1 IMPLANT
KIT TURNOVER CYSTO (KITS) ×1 IMPLANT
MANIFOLD NEPTUNE II (INSTRUMENTS) ×1 IMPLANT
PACK CYSTO AR (MISCELLANEOUS) ×1 IMPLANT
SET CYSTO W/LG BORE CLAMP LF (SET/KITS/TRAYS/PACK) ×1 IMPLANT
STENT URET 6FRX24 CONTOUR (STENTS) IMPLANT
STENT URET 6FRX26 CONTOUR (STENTS) IMPLANT
STENT URO INLAY 6FRX26CM (STENTS) IMPLANT
SURGILUBE 2OZ TUBE FLIPTOP (MISCELLANEOUS) ×1 IMPLANT
SYR TOOMEY IRRIG 70ML (MISCELLANEOUS)
SYRINGE TOOMEY IRRIG 70ML (MISCELLANEOUS) IMPLANT
TRAP FLUID SMOKE EVACUATOR (MISCELLANEOUS) ×1 IMPLANT
WATER STERILE IRR 1000ML POUR (IV SOLUTION) ×1 IMPLANT
WATER STERILE IRR 500ML POUR (IV SOLUTION) ×1 IMPLANT

## 2023-03-12 NOTE — Anesthesia Preprocedure Evaluation (Addendum)
Anesthesia Evaluation  Patient identified by MRN, date of birth, ID band Patient awake    Reviewed: Allergy & Precautions, NPO status , Patient's Chart, lab work & pertinent test results  History of Anesthesia Complications (+) PONV, DIFFICULT AIRWAY and history of anesthetic complications  Airway Mallampati: III  TM Distance: <3 FB Neck ROM: Limited    Dental  (+) Dental Advidsory Given, Poor Dentition   Pulmonary shortness of breath and at rest, neg COPD Severe PHTN  SOB 2/2 A fib. Patient states it is normal to be short of breath at rest with she is in A Fib.    + decreased breath sounds      Cardiovascular Exercise Tolerance: Poor hypertension, Pt. on medications + CAD and +CHF  + dysrhythmias Atrial Fibrillation  Rhythm:irregular     Neuro/Psych negative neurological ROS  negative psych ROS   GI/Hepatic negative GI ROS, Neg liver ROS,,,  Endo/Other  diabetes, Well Controlled, Type 1    Renal/GU CRFRenal disease  negative genitourinary   Musculoskeletal  (+) Arthritis ,    Abdominal  (+) + obese  Peds negative pediatric ROS (+)  Hematology negative hematology ROS (+)   Anesthesia Other Findings Past Medical History: No date: Anemia No date: Aortic atherosclerosis (HCC) No date: Arthritis 02/27/2015: CAD (coronary artery disease)     Comment:  a.) MPI 02/27/2015: EF 62%, small/mild area of septal               hypoperfusion with borderline partial redistribution;               equivocal study; b.) LHC 03/13/2015: LVEDP 16 mmHg; 10%               mLAD, 10% RCA --> no intervention required (med mgmt);               c.) MPI 03/16/2019: EF 56%; normal study. No date: Cataracts, both eyes No date: Cervical disc disease     Comment:  a.) s/p cervical fusion No date: Chickenpox No date: CKD (chronic kidney disease), stage IV (HCC) No date: Complication of anesthesia     Comment:  a.) remote h/o PONV; b.)  difficult intubation related to              remote cervical fusion No date: Difficult intubation     Comment:  a.) secondary to remote cervical fusion; reports severe               pain s/p being intubated No date: Dyspnea on exertion No date: HFrEF (heart failure with reduced ejection fraction) ([place])     Comment:  a.) TTE 01/11/2014: EF >55%, mod LVH, MAC, mild RAE,               mild MR/TR, sev PR; b.) TTE 11/14/2018: EF 40%, sep and               inf HK, mild LAE, triv P, mod MR/TR, G2DD; c.) TTE               11/15/2020: EF 45%, sep HK, mild LVH, triv AR/PR, mild               MR/TR, d.) TTE 10/28/2021: EF 45%, mild glob HK, mild               LVH, triv AR/PR, mild MR, mod TR; e.) TTE 11/06/2021: EF               30-35%,  mid apical and anteroseptal HK, mild TR, mod MR,               G2DD. No date: History of recurrent UTI (urinary tract infection) No date: Hyperlipidemia No date: Hypertension No date: Incontinence in female     Comment:  wears pads No date: LBBB (left bundle branch block) No date: Long term (current) use of anticoagulants     Comment:  a.) rivaroxaban No date: Long-term current use of opiate analgesic     Comment:  a.) hydrocodone/APAP ([place]) No date: Lumbar disc disease     Comment:  a.) s/p lumbar interbody fusion L2-L5 No date: PAF (paroxysmal atrial fibrillation) (HCC)     Comment:  a.) CHA2DS2VASc = 7 (age x2, sex, HFrEF, HTN, vascular               diasease, T2DM); b.) s/p DCCV 01/12/2017 (150 J x 1) and               11/04/2021 (120 J x 1); c.) rate/rhythm maintained on               oral amiodarone + metoprolol succinate; chronically               anticoagulated with rivaroxaban No date: PONV (postoperative nausea and vomiting)     Comment:  with 1st pregnancy 50 years ago and no problem since               then No date: Right leg weakness No date: Sciatica No date: Spinal stenosis of lumbar region No date: Type 2 diabetes mellitus treated with  insulin ([place]) No date: Ventral hernia  Past Surgical History: 11/04/2021: CARDIOVERSION; N/A     Comment:  Procedure: CARDIOVERSION;  Surgeon: Teodoro Spray, MD;              Location: [facility];  Service: Cardiovascular;                Laterality: N/A; No date: CATARACT EXTRACTION, BILATERAL 1996: CERVICAL LAMINECTOMY 12/06/2020: CYSTOSCOPY W/ RETROGRADES     Comment:  Procedure: CYSTOSCOPY WITH RETROGRADE PYELOGRAM;                Surgeon: Billey Co, MD;  Location: [facility];                Service: Urology;; 04/10/2022: Consuela Mimes W/ URETERAL STENT PLACEMENT; Left     Comment:  Procedure: CYSTOSCOPY WITH STENT REPLACEMENT;  Surgeon:               Billey Co, MD;  Location: [facility];  Service:               Urology;  Laterality: Left; 12/06/2020: CYSTOSCOPY WITH STENT PLACEMENT; Left     Comment:  Procedure: CYSTOSCOPY WITH STENT PLACEMENT;  Surgeon:               Billey Co, MD;  Location: [facility];  Service:               Urology;  Laterality: Left; 01/12/2017: ELECTROPHYSIOLOGIC STUDY; N/A     Comment:  Procedure: Cardioversion;  Surgeon: Teodoro Spray, MD;               Location: [facility];  Service: Cardiovascular;                Laterality: N/A; No date: JOINT REPLACEMENT 01/2010: LATERAL FUSION LUMBAR SPINE, TRANSVERSE     Comment:  L2-L5 03/13/2015: LEFT HEART CATH AND CORONARY ANGIOGRAPHY; Left     Comment:  Procedure: LEFT HEART CATH AND CORONARY ANGIOGRAPHY;               Location: [place]; Surgeon: Bartholome Bill, MD 2010: [place]; Right No date: TONSILLECTOMY 05/23/2019: TOTAL KNEE ARTHROPLASTY; Left     Comment:  Procedure: TOTAL KNEE ARTHROPLASTY - LEFT - DIABETIC;                Surgeon: Corky Mull, MD;  Location: [facility];                Service: Orthopedics;  Laterality: Left; 12/06/2020: URETEROSCOPY; N/A     Comment:  Procedure: DIAGNOSTIC URETEROSCOPY;  Surgeon: Billey Co, MD;  Location: [facility];   Service: Urology;                Laterality: N/A;  BMI    Body Mass Index: 35.67 kg/m      Reproductive/Obstetrics negative OB ROS                             Anesthesia Physical Anesthesia Plan  ASA: 3  Anesthesia Plan: General   Post-op Pain Management:    Induction: Intravenous  PONV Risk Score and Plan: 4 or greater and Ondansetron, Dexamethasone, Propofol infusion and TIVA  Airway Management Planned: Oral ETT  Additional Equipment:   Intra-op Plan:   Post-operative Plan: Extubation in OR  Informed Consent: I have reviewed the patients History and Physical, chart, labs and discussed the procedure including the risks, benefits and alternatives for the proposed anesthesia with the patient or authorized representative who has indicated his/her understanding and acceptance.     Dental Advisory Given  Plan Discussed with: CRNA and Surgeon  Anesthesia Plan Comments: (Patient consented for risks of anesthesia including but not limited to:  - adverse reactions to medications - risk of airway placement if required - damage to eyes, teeth, lips or other oral mucosa - nerve damage due to positioning  - sore throat or hoarseness - Damage to heart, brain, nerves, lungs, other parts of body or loss of life  Patient voiced understanding.)        Anesthesia Quick Evaluation

## 2023-03-12 NOTE — Transfer of Care (Signed)
Immediate Anesthesia Transfer of Care Note  Patient: Tiffany [name]  Procedure(s) Performed: CYSTOSCOPY WITH STENT EXCHANGE (Left)  Patient Location: PACU  Anesthesia Type:MAC  Level of Consciousness: awake, alert , and oriented  Airway & Oxygen Therapy: Patient Spontanous Breathing  Post-op Assessment: Report given to RN and Post -op Vital signs reviewed and stable  Post vital signs: Reviewed and stable  Last Vitals:  Vitals Value Taken Time  BP    Temp    Pulse    Resp    SpO2      Last Pain:  Vitals:   03/12/23 1036  TempSrc: Temporal  PainSc: 0-No pain      Patients Stated Pain Goal: 0 ([id number])  Complications: No notable events documented.

## 2023-03-12 NOTE — Anesthesia Postprocedure Evaluation (Signed)
Anesthesia Post Note  Patient: Tiffany [name]  Procedure(s) Performed: CYSTOSCOPY WITH STENT EXCHANGE (Left)  Patient location during evaluation: PACU Anesthesia Type: General Level of consciousness: awake and alert Pain management: pain level controlled Vital Signs Assessment: post-procedure vital signs reviewed and stable Respiratory status: spontaneous breathing, nonlabored ventilation, respiratory function stable and patient connected to nasal cannula oxygen Cardiovascular status: blood pressure returned to baseline and stable Postop Assessment: no apparent nausea or vomiting Anesthetic complications: no  No notable events documented.   Last Vitals:  Vitals:   03/12/23 1205 03/12/23 1214  BP: 124/71   Pulse: 61   Resp: 16   Temp:  (!) 36 C  SpO2: 100%     Last Pain:  Vitals:   03/12/23 1214  TempSrc: Temporal  PainSc:                  Dimas Millin

## 2023-03-12 NOTE — Progress Notes (Signed)
Patient was scheduled yesterday with updated orders. Taking out of Surgery InBasket.

## 2023-03-12 NOTE — Op Note (Signed)
Date of procedure: 03/12/23  Preoperative diagnosis:  Left hydronephrosis  Postoperative diagnosis:  Same  Procedure: Cystoscopy, left ureteral stent change  Surgeon: Nickolas Madrid, MD  Anesthesia: General  Complications: None  Intraoperative findings:  Mild to moderate bladder trabeculations, ureteral orifices orthotopic bilaterally, uncomplicated left ureteral stent change  EBL: Minimal  Specimens: None  Drains: Left 6 French by 26 cm Bard Optima stent  Indication: Tiffany [name] is a 89 y.o. patient with left hydronephrosis of unclear etiology, her kidney function has improved after ureteral stent placement and they opted for chronic changes.  I had previously recommended a renal ultrasound with Foley catheter in place to evaluate if this may be secondary to reflux versus left ureteral obstruction, but this was never completed.  After reviewing the management options for treatment, they elected to proceed with the above surgical procedure(s). We have discussed the potential benefits and risks of the procedure, side effects of the proposed treatment, the likelihood of the patient achieving the goals of the procedure, and any potential problems that might occur during the procedure or recuperation. Informed consent has been obtained.  Description of procedure:  The patient was taken to the operating room and MAC was induced. SCDs were placed for DVT prophylaxis. The patient was placed in the dorsal lithotomy position, prepped and draped in the usual sterile fashion, and preoperative antibiotics(Cipro) were administered. A preoperative time-out was performed.   A 21 French rigid cystoscope was used to intubate the urethra and thorough cystoscopy was performed.  There were mild to moderate bladder trabeculations, no suspicious lesions, and the ureteral orifices were orthotopic bilaterally.  The left ureteral stent was mildly encrusted.  A sensor wire was advanced alongside the stent  up to the kidney under fluoroscopic vision.  The old stent was grasped and removed in its entirety.  I opted to place a slightly longer 6 [place] by 26 cm ureteral stent based on the prior images.  There was an excellent curl in the upper pole, as well as under direct vision the bladder.  Fluid drained through the sideports of the stent.  The bladder was drained and this concluded our procedure.  Disposition: Stable to PACU  Plan: Repeat BMP in 1 to 2 weeks with PCP, will follow-up results Anticipate yearly stent change  Nickolas Madrid, MD

## 2023-03-12 NOTE — Discharge Instructions (Signed)
AMBULATORY SURGERY  DISCHARGE INSTRUCTIONS   The drugs that you were given will stay in your system until tomorrow so for the next 24 hours you should not:  Drive an automobile Make any legal decisions Drink any alcoholic beverage   You may resume regular meals tomorrow.  Today it is better to start with liquids and gradually work up to solid foods.  You may eat anything you prefer, but it is better to start with liquids, then soup and crackers, and gradually work up to solid foods.   Please notify your doctor immediately if you have any unusual bleeding, trouble breathing, redness and pain at the surgery site, drainage, fever, or pain not relieved by medication.       Please contact your physician with any problems or Same Day Surgery at [phone], Monday through Friday 6 am to 4 pm, or [place] at [place] Main number at [phone].  

## 2023-03-12 NOTE — H&P (Signed)
03/12/23 10:51 AM   Tiffany [name] May 23, 1933 [id number]  CC: Left hydronephrosis, CKD  HPI: 89 year old female with CKD, left hydronephrosis of unclear etiology managed with chronic left ureteral stent, reports worsening flank pain over the last few weeks as well as worsened renal function with creatinine yesterday 3.9 from 2.3.   Renal ultrasound shows left hydronephrosis.  PVR on bladder ultrasound 283ml, and she has a known component of incomplete bladder emptying.   We discussed the challenges of determining if this is reflux up the stent versus obstruction.  I had previously recommended a repeat renal ultrasound with a catheter in place but her catheter was removed prior to that ultrasound being completed.  Most importantly, when she originally presented with hydronephrosis in 2021, renal function improved back to baseline after placement of the ureteral stent.  Some of her elevated sCr may be related to Bactrim prescribed by PCP.   PMH: Past Medical History:  Diagnosis Date   Anemia    Aortic atherosclerosis ([place])    Arthritis    CAD (coronary artery disease) 02/27/2015   a.) MPI 02/27/2015: EF 62%, small/mild area of septal hypoperfusion with borderline partial redistribution; equivocal study; b.) LHC 03/13/2015: LVEDP 16 mmHg; 10% mLAD, 10% RCA --> no intervention required (med mgmt); c.) MPI 03/16/2019: EF 56%; normal study.   Cataracts, both eyes    Cervical disc disease    a.) s/p cervical fusion   Chickenpox    CKD (chronic kidney disease), stage IV (HCC)    Complication of anesthesia    a.) remote h/o PONV; b.) difficult intubation related to remote cervical fusion   Difficult intubation    a.) secondary to remote cervical fusion; reports severe pain s/p being intubated   Dyspnea on exertion    HFrEF (heart failure with reduced ejection fraction) ([place])    a.) TTE 01/11/2014: EF >55%, mod LVH, MAC, mild RAE, mild MR/TR, sev PR; b.) TTE 11/14/2018: EF 40%, sep  and inf HK, mild LAE, triv P, mod MR/TR, G2DD; c.) TTE 11/15/2020: EF 45%, sep HK, mild LVH, triv AR/PR, mild MR/TR, d.) TTE 10/28/2021: EF 45%, mild glob HK, mild LVH, triv AR/PR, mild MR, mod TR; e.) TTE 11/06/2021: EF 30-35%, mid apical and anteroseptal HK, mild TR, mod MR, G2DD.   History of recurrent UTI (urinary tract infection)    Hyperlipidemia    Hypertension    Incontinence in female    wears pads   LBBB (left bundle branch block)    Long term (current) use of anticoagulants    a.) rivaroxaban   Long-term current use of opiate analgesic    a.) hydrocodone/APAP ([place])   Lumbar disc disease    a.) s/p lumbar interbody fusion L2-L5   PAF (paroxysmal atrial fibrillation) ([place])    a.) CHA2DS2VASc = 7 (age x2, sex, HFrEF, HTN, vascular diasease, T2DM); b.) s/p DCCV 01/12/2017 (150 J x 1) and 11/04/2021 (120 J x 1); c.) rate/rhythm maintained on oral amiodarone + metoprolol succinate; chronically anticoagulated with rivaroxaban   PONV (postoperative nausea and vomiting)    with 1st pregnancy 50 years ago and no problem since then   Right leg weakness    Sciatica    Spinal stenosis of lumbar region    Type 2 diabetes mellitus treated with insulin (Tiffany [name])    Ventral hernia     Surgical History: Past Surgical History:  Procedure Laterality Date   CARDIOVERSION N/A 11/04/2021   Procedure: CARDIOVERSION;  Surgeon: Teodoro Spray, MD;  Location: [facility];  Service: Cardiovascular;  Laterality: N/A;   CATARACT EXTRACTION, BILATERAL     CERVICAL LAMINECTOMY  1996   CYSTOSCOPY W/ RETROGRADES  12/06/2020   Procedure: CYSTOSCOPY WITH RETROGRADE PYELOGRAM;  Surgeon: Billey Co, MD;  Location: [facility];  Service: Urology;;   CYSTOSCOPY W/ URETERAL STENT PLACEMENT Left 04/10/2022   Procedure: CYSTOSCOPY WITH STENT REPLACEMENT;  Surgeon: Billey Co, MD;  Location: [facility];  Service: Urology;  Laterality: Left;   CYSTOSCOPY WITH STENT PLACEMENT Left 12/06/2020   Procedure:  CYSTOSCOPY WITH STENT PLACEMENT;  Surgeon: Billey Co, MD;  Location: [facility];  Service: Urology;  Laterality: Left;   ELECTROPHYSIOLOGIC STUDY N/A 01/12/2017   Procedure: Cardioversion;  Surgeon: Teodoro Spray, MD;  Location: [facility];  Service: Cardiovascular;  Laterality: N/A;   INSERTION OF MESH N/A 08/27/2022   Procedure: INSERTION OF MESH;  Surgeon: [name], DO;  Location: [facility];  Service: General;  Laterality: N/A;   JOINT REPLACEMENT     LATERAL FUSION LUMBAR SPINE, TRANSVERSE  01/2010   L2-L5   LEFT HEART CATH AND CORONARY ANGIOGRAPHY Left 03/13/2015   Procedure: LEFT HEART CATH AND CORONARY ANGIOGRAPHY; Location: [place]; Surgeon: Bartholome Bill, MD   ROTATOR CUFF REPAIR Right 2010   TONSILLECTOMY     TOTAL KNEE ARTHROPLASTY Left 05/23/2019   Procedure: TOTAL KNEE ARTHROPLASTY - LEFT - DIABETIC;  Surgeon: Corky Mull, MD;  Location: [facility];  Service: Orthopedics;  Laterality: Left;   URETEROSCOPY N/A 12/06/2020   Procedure: DIAGNOSTIC URETEROSCOPY;  Surgeon: Billey Co, MD;  Location: [facility];  Service: Urology;  Laterality: N/A;   XI ROBOTIC ASSISTED VENTRAL HERNIA N/A 08/27/2022   Procedure: XI ROBOTIC ASSISTED VENTRAL HERNIA;  Surgeon: [name], DO;  Location: [facility];  Service: General;  Laterality: N/A;     Family History: Family History  Adopted: Yes    Social History:  reports that she has never smoked. She has never been exposed to tobacco smoke. She has never used smokeless tobacco. She reports current alcohol use of about 1.0 standard drink of alcohol per week. She reports that she does not use drugs.  Physical Exam: BP (!) 107/92   Pulse 73   Temp (!) 97.3 F (36.3 C) (Temporal)   Resp 20   Ht 5\' 2"  (1.575 m)   Wt 92.5 kg   SpO2 98%   BMI 37.31 kg/m    Constitutional:  Alert and oriented, No acute distress. Cardiovascular: RRR Respiratory: CTA b/l GI: Abdomen is soft, nontender, nondistended, no abdominal masses   Laboratory  Data: Urine culture 2/28 klebsiella treated with bactrim  Assessment & Plan:   89 year old female with chronic left hydronephrosis felt to be secondary to a left ureteral stricture, has improved with chronic left ureteral stent.  Also has a component of chronic incomplete emptying, but never underwent renal ultrasound with Foley catheter in place as recommended to determine if this is more related to ureteral stricture versus reflux.  We discussed the challenges of determining if this is reflux up the stent versus obstruction.  I had previously recommended a repeat renal ultrasound with a catheter in place but her catheter was removed prior to that ultrasound being completed.  Most importantly, when she originally presented with hydronephrosis in 2021, renal function improved back to baseline after placement of the ureteral stent.  Cystoscopy and left ureteral stent change  Nickolas Madrid, MD 03/12/2023  [facility] [address] [place]  [place], [place] [address] [phone]

## 2023-03-13 ENCOUNTER — Encounter: Payer: Self-pay | Admitting: Urology

## 2023-03-17 ENCOUNTER — Ambulatory Visit
Admission: RE | Admit: 2023-03-17 | Discharge: 2023-03-17 | Disposition: A | Discharge status: Home / Self Care | Payer: PPO | Source: Ambulatory Visit | Attending: Cardiology | Admitting: Cardiology

## 2023-03-17 ENCOUNTER — Encounter: Payer: Self-pay | Admitting: Cardiology

## 2023-03-17 ENCOUNTER — Ambulatory Visit: Payer: PPO | Admitting: Anesthesiology

## 2023-03-17 ENCOUNTER — Encounter
Admission: RE | Disposition: A | Discharge status: Home / Self Care | Payer: Self-pay | Source: Ambulatory Visit | Attending: Cardiology

## 2023-03-17 DIAGNOSIS — I5023 Acute on chronic systolic (congestive) heart failure: Secondary | ICD-10-CM | POA: Diagnosis not present

## 2023-03-17 DIAGNOSIS — Z7901 Long term (current) use of anticoagulants: Secondary | ICD-10-CM | POA: Diagnosis not present

## 2023-03-17 DIAGNOSIS — I2510 Atherosclerotic heart disease of native coronary artery without angina pectoris: Secondary | ICD-10-CM | POA: Diagnosis not present

## 2023-03-17 DIAGNOSIS — Z794 Long term (current) use of insulin: Secondary | ICD-10-CM | POA: Insufficient documentation

## 2023-03-17 DIAGNOSIS — I130 Hypertensive heart and chronic kidney disease with heart failure and stage 1 through stage 4 chronic kidney disease, or unspecified chronic kidney disease: Secondary | ICD-10-CM | POA: Diagnosis not present

## 2023-03-17 DIAGNOSIS — I428 Other cardiomyopathies: Secondary | ICD-10-CM | POA: Insufficient documentation

## 2023-03-17 DIAGNOSIS — I480 Paroxysmal atrial fibrillation: Secondary | ICD-10-CM | POA: Diagnosis not present

## 2023-03-17 DIAGNOSIS — N184 Chronic kidney disease, stage 4 (severe): Secondary | ICD-10-CM | POA: Diagnosis not present

## 2023-03-17 DIAGNOSIS — I5022 Chronic systolic (congestive) heart failure: Secondary | ICD-10-CM | POA: Insufficient documentation

## 2023-03-17 DIAGNOSIS — I4819 Other persistent atrial fibrillation: Secondary | ICD-10-CM | POA: Insufficient documentation

## 2023-03-17 DIAGNOSIS — I482 Chronic atrial fibrillation, unspecified: Secondary | ICD-10-CM

## 2023-03-17 DIAGNOSIS — I4891 Unspecified atrial fibrillation: Secondary | ICD-10-CM | POA: Diagnosis not present

## 2023-03-17 DIAGNOSIS — E1122 Type 2 diabetes mellitus with diabetic chronic kidney disease: Secondary | ICD-10-CM | POA: Diagnosis not present

## 2023-03-17 DIAGNOSIS — E782 Mixed hyperlipidemia: Secondary | ICD-10-CM | POA: Diagnosis not present

## 2023-03-17 DIAGNOSIS — I110 Hypertensive heart disease with heart failure: Secondary | ICD-10-CM | POA: Diagnosis not present

## 2023-03-17 HISTORY — PX: CARDIOVERSION: SHX1299

## 2023-03-17 LAB — GLUCOSE, CAPILLARY: Glucose-Capillary: 151 mg/dL — ABNORMAL HIGH (ref 70–99)

## 2023-03-17 SURGERY — CARDIOVERSION
Anesthesia: General

## 2023-03-17 MED ORDER — PROPOFOL 10 MG/ML IV BOLUS
INTRAVENOUS | Status: DC | PRN
  Administered 2023-03-17: 30 mg via INTRAVENOUS
  Administered 2023-03-17 (×3): 10 mg via INTRAVENOUS

## 2023-03-17 MED ORDER — SODIUM CHLORIDE 0.9 % IV SOLN
INTRAVENOUS | Status: DC
  Administered 2023-03-17: 1000 mL via INTRAVENOUS

## 2023-03-17 NOTE — Anesthesia Preprocedure Evaluation (Addendum)
Anesthesia Evaluation  Patient identified by MRN, date of birth, ID band Patient awake    Reviewed: Allergy & Precautions, NPO status , Patient's Chart, lab work & pertinent test results  History of Anesthesia Complications (+) PONV, DIFFICULT AIRWAY and history of anesthetic complications  Airway Mallampati: III  TM Distance: <3 FB Neck ROM: Limited    Dental  (+) Dental Advidsory Given, Poor Dentition, Chipped   Pulmonary shortness of breath and at rest, neg COPD Severe PHTN  SOB 2/2 A fib. Patient states it is normal to be short of breath at rest with she is in A Fib.    + decreased breath sounds      Cardiovascular Exercise Tolerance: Poor hypertension, Pt. on medications pulmonary hypertension+ CAD and +CHF  + dysrhythmias Atrial Fibrillation + Valvular Problems/Murmurs MR  Rhythm:irregular Rate:Normal  TTE 2024: INTERPRETATION  MODERATE LV SYSTOLIC DYSFUNCTION (See above)   WITH MILD LVH  NORMAL RIGHT VENTRICULAR SYSTOLIC FUNCTION  MODERATE VALVULAR REGURGITATION (See above)  NO VALVULAR STENOSIS  MODERATE to SEVERE MR, TR  SEVERE PHTN  TRIVIAL AR  MILD PR  EF 30-35%     Neuro/Psych negative neurological ROS  negative psych ROS   GI/Hepatic negative GI ROS, Neg liver ROS,,,  Endo/Other  diabetes, Well Controlled, Type 1    Renal/GU CRFRenal disease  negative genitourinary   Musculoskeletal  (+) Arthritis ,    Abdominal  (+) + obese  Peds negative pediatric ROS (+)  Hematology negative hematology ROS (+)   Anesthesia Other Findings Past Medical History: No date: Anemia No date: Aortic atherosclerosis (HCC) No date: Arthritis 02/27/2015: CAD (coronary artery disease)     Comment:  a.) MPI 02/27/2015: EF 62%, small/mild area of septal               hypoperfusion with borderline partial redistribution;               equivocal study; b.) LHC 03/13/2015: LVEDP 16 mmHg; 10%               mLAD, 10%  RCA --> no intervention required (med mgmt);               c.) MPI 03/16/2019: EF 56%; normal study. No date: Cataracts, both eyes No date: Cervical disc disease     Comment:  a.) s/p cervical fusion No date: Chickenpox No date: CKD (chronic kidney disease), stage IV (HCC) No date: Complication of anesthesia     Comment:  a.) remote h/o PONV; b.) difficult intubation related to              remote cervical fusion No date: Difficult intubation     Comment:  a.) secondary to remote cervical fusion; reports severe               pain s/p being intubated No date: Dyspnea on exertion No date: HFrEF (heart failure with reduced ejection fraction) ([place])     Comment:  a.) TTE 01/11/2014: EF >55%, mod LVH, MAC, mild RAE,               mild MR/TR, sev PR; b.) TTE 11/14/2018: EF 40%, sep and               inf HK, mild LAE, triv P, mod MR/TR, G2DD; c.) TTE               11/15/2020: EF 45%, sep HK, mild LVH, triv AR/PR, mild  MR/TR, d.) TTE 10/28/2021: EF 45%, mild glob HK, mild               LVH, triv AR/PR, mild MR, mod TR; e.) TTE 11/06/2021: EF               30-35%, mid apical and anteroseptal HK, mild TR, mod MR,               G2DD. No date: History of recurrent UTI (urinary tract infection) No date: Hyperlipidemia No date: Hypertension No date: Incontinence in female     Comment:  wears pads No date: LBBB (left bundle branch block) No date: Long term (current) use of anticoagulants     Comment:  a.) rivaroxaban No date: Long-term current use of opiate analgesic     Comment:  a.) hydrocodone/APAP ([place]) No date: Lumbar disc disease     Comment:  a.) s/p lumbar interbody fusion L2-L5 No date: PAF (paroxysmal atrial fibrillation) (HCC)     Comment:  a.) CHA2DS2VASc = 7 (age x2, sex, HFrEF, HTN, vascular               diasease, T2DM); b.) s/p DCCV 01/12/2017 (150 J x 1) and               11/04/2021 (120 J x 1); c.) rate/rhythm maintained on               oral amiodarone +  metoprolol succinate; chronically               anticoagulated with rivaroxaban No date: PONV (postoperative nausea and vomiting)     Comment:  with 1st pregnancy 50 years ago and no problem since               then No date: Right leg weakness No date: Sciatica No date: Spinal stenosis of lumbar region No date: Type 2 diabetes mellitus treated with insulin ([place]) No date: Ventral hernia  Past Surgical History: 11/04/2021: CARDIOVERSION; N/A     Comment:  Procedure: CARDIOVERSION;  Surgeon: Teodoro Spray, MD;              Location: [facility];  Service: Cardiovascular;                Laterality: N/A; No date: CATARACT EXTRACTION, BILATERAL 1996: CERVICAL LAMINECTOMY 12/06/2020: CYSTOSCOPY W/ RETROGRADES     Comment:  Procedure: CYSTOSCOPY WITH RETROGRADE PYELOGRAM;                Surgeon: Billey Co, MD;  Location: [facility];                Service: Urology;; 04/10/2022: Consuela Mimes W/ URETERAL STENT PLACEMENT; Left     Comment:  Procedure: CYSTOSCOPY WITH STENT REPLACEMENT;  Surgeon:               Billey Co, MD;  Location: [facility];  Service:               Urology;  Laterality: Left; 12/06/2020: CYSTOSCOPY WITH STENT PLACEMENT; Left     Comment:  Procedure: CYSTOSCOPY WITH STENT PLACEMENT;  Surgeon:               Billey Co, MD;  Location: [facility];  Service:               Urology;  Laterality: Left; 01/12/2017: ELECTROPHYSIOLOGIC STUDY; N/A     Comment:  Procedure: Cardioversion;  Surgeon: Teodoro Spray, MD;  Location: [facility];  Service: Cardiovascular;                Laterality: N/A; No date: JOINT REPLACEMENT 01/2010: LATERAL FUSION LUMBAR SPINE, TRANSVERSE     Comment:  L2-L5 03/13/2015: LEFT HEART CATH AND CORONARY ANGIOGRAPHY; Left     Comment:  Procedure: LEFT HEART CATH AND CORONARY ANGIOGRAPHY;               Location: [place]; Surgeon: Bartholome Bill, MD 2010: [place]; Right No date: TONSILLECTOMY 05/23/2019: TOTAL KNEE  ARTHROPLASTY; Left     Comment:  Procedure: TOTAL KNEE ARTHROPLASTY - LEFT - DIABETIC;                Surgeon: Corky Mull, MD;  Location: [facility];                Service: Orthopedics;  Laterality: Left; 12/06/2020: URETEROSCOPY; N/A     Comment:  Procedure: DIAGNOSTIC URETEROSCOPY;  Surgeon: Billey Co, MD;  Location: [facility];  Service: Urology;                Laterality: N/A;  BMI    Body Mass Index: 35.67 kg/m      Reproductive/Obstetrics negative OB ROS                             Anesthesia Physical Anesthesia Plan  ASA: 3  Anesthesia Plan: General   Post-op Pain Management: Minimal or no pain anticipated   Induction: Intravenous  PONV Risk Score and Plan: 3 and Propofol infusion, TIVA and Ondansetron  Airway Management Planned: Nasal Cannula and Natural Airway  Additional Equipment: None  Intra-op Plan:   Post-operative Plan:   Informed Consent: I have reviewed the patients History and Physical, chart, labs and discussed the procedure including the risks, benefits and alternatives for the proposed anesthesia with the patient or authorized representative who has indicated his/her understanding and acceptance.     Dental advisory given  Plan Discussed with: CRNA and Surgeon  Anesthesia Plan Comments: (Discussed risks of anesthesia with patient, including possibility of difficulty with spontaneous ventilation under anesthesia necessitating airway intervention, PONV, and rare risks such as cardiac or respiratory or neurological events, and allergic reactions. Discussed the role of CRNA in patient's perioperative care. Patient understands.)        Anesthesia Quick Evaluation

## 2023-03-17 NOTE — Transfer of Care (Signed)
Immediate Anesthesia Transfer of Care Note  Patient: Tiffany [name]  Procedure(s) Performed: CARDIOVERSION  Patient Location: PACU  Anesthesia Type:General  Level of Consciousness: awake, alert , and oriented  Airway & Oxygen Therapy: Patient Spontanous Breathing  Post-op Assessment: Report given to RN and Post -op Vital signs reviewed and stable  Post vital signs: Reviewed and stable  Last Vitals:  Vitals Value Taken Time  BP 118/60 03/17/23 0741  Temp    Pulse 60 03/17/23 0741  Resp 30 03/17/23 0741  SpO2 92 % 03/17/23 0741  Vitals shown include unvalidated device data.  Last Pain:  Vitals:   03/17/23 0708  TempSrc: Oral  PainSc: 5          Complications: No notable events documented.

## 2023-03-17 NOTE — Anesthesia Postprocedure Evaluation (Signed)
Anesthesia Post Note  Patient: Tiffany [name]  Procedure(s) Performed: CARDIOVERSION  Patient location during evaluation: Other Anesthesia Type: General Level of consciousness: awake and alert Pain management: pain level controlled Vital Signs Assessment: post-procedure vital signs reviewed and stable Respiratory status: spontaneous breathing, nonlabored ventilation, respiratory function stable and patient connected to nasal cannula oxygen Cardiovascular status: blood pressure returned to baseline and stable Postop Assessment: no apparent nausea or vomiting Anesthetic complications: no   No notable events documented.   Last Vitals:  Vitals:   03/17/23 0741 03/17/23 0745  BP: 118/60 (!) 100/49  Pulse: 60 (!) 56  Resp: (!) 22 (!) 26  Temp:    SpO2: 96% 96%    Last Pain:  Vitals:   03/17/23 0745  TempSrc:   PainSc: 0-No pain                 Arita Miss

## 2023-03-17 NOTE — Op Note (Signed)
[facility]   03/17/2023                     8:01 AM  PATIENT:  Tiffany [name]    PRE-OPERATIVE DIAGNOSIS:  Cardioversion  Afib  POST-OPERATIVE DIAGNOSIS:  Same  PROCEDURE:  CARDIOVERSION  SURGEON:  Isaias Cowman, MD    ANESTHESIA:     PREOPERATIVE INDICATIONS:  [name] is a  89 y.o. female with a diagnosis of Cardioversion  Afib who failed conservative measures and elected for surgical management.    The risks benefits and alternatives were discussed with the patient preoperatively including but not limited to the risks of infection, bleeding, cardiopulmonary complications, the need for revision surgery, among others, and the patient was willing to proceed.   OPERATIVE PROCEDURE: The patient presented to special procedures in a fasting state.  ECG revealed atrial fibrillation at a rate of 76 bpm.  She received 60 mg propofol.  She underwent electrical cardioversion with 75 J with conversion to sinus rhythm.  There were no periprocedural complications.

## 2023-03-18 ENCOUNTER — Encounter: Payer: Self-pay | Admitting: Cardiology

## 2023-03-23 DIAGNOSIS — I447 Left bundle-branch block, unspecified: Secondary | ICD-10-CM | POA: Diagnosis not present

## 2023-03-23 DIAGNOSIS — Z794 Long term (current) use of insulin: Secondary | ICD-10-CM | POA: Diagnosis not present

## 2023-03-23 DIAGNOSIS — E782 Mixed hyperlipidemia: Secondary | ICD-10-CM | POA: Diagnosis not present

## 2023-03-23 DIAGNOSIS — E1122 Type 2 diabetes mellitus with diabetic chronic kidney disease: Secondary | ICD-10-CM | POA: Diagnosis not present

## 2023-03-23 DIAGNOSIS — N184 Chronic kidney disease, stage 4 (severe): Secondary | ICD-10-CM | POA: Diagnosis not present

## 2023-03-23 DIAGNOSIS — I480 Paroxysmal atrial fibrillation: Secondary | ICD-10-CM | POA: Diagnosis not present

## 2023-03-23 DIAGNOSIS — I10 Essential (primary) hypertension: Secondary | ICD-10-CM | POA: Diagnosis not present

## 2023-03-23 DIAGNOSIS — I5022 Chronic systolic (congestive) heart failure: Secondary | ICD-10-CM | POA: Diagnosis not present

## 2023-04-14 DIAGNOSIS — E782 Mixed hyperlipidemia: Secondary | ICD-10-CM | POA: Diagnosis not present

## 2023-04-14 DIAGNOSIS — I447 Left bundle-branch block, unspecified: Secondary | ICD-10-CM | POA: Diagnosis not present

## 2023-04-14 DIAGNOSIS — Z794 Long term (current) use of insulin: Secondary | ICD-10-CM | POA: Diagnosis not present

## 2023-04-14 DIAGNOSIS — N184 Chronic kidney disease, stage 4 (severe): Secondary | ICD-10-CM | POA: Diagnosis not present

## 2023-04-14 DIAGNOSIS — I5022 Chronic systolic (congestive) heart failure: Secondary | ICD-10-CM | POA: Diagnosis not present

## 2023-04-14 DIAGNOSIS — I10 Essential (primary) hypertension: Secondary | ICD-10-CM | POA: Diagnosis not present

## 2023-04-14 DIAGNOSIS — E1122 Type 2 diabetes mellitus with diabetic chronic kidney disease: Secondary | ICD-10-CM | POA: Diagnosis not present

## 2023-04-14 DIAGNOSIS — I480 Paroxysmal atrial fibrillation: Secondary | ICD-10-CM | POA: Diagnosis not present

## 2023-05-04 DIAGNOSIS — I5022 Chronic systolic (congestive) heart failure: Secondary | ICD-10-CM | POA: Diagnosis not present

## 2023-05-04 DIAGNOSIS — N184 Chronic kidney disease, stage 4 (severe): Secondary | ICD-10-CM | POA: Diagnosis not present

## 2023-05-04 DIAGNOSIS — I447 Left bundle-branch block, unspecified: Secondary | ICD-10-CM | POA: Diagnosis not present

## 2023-05-26 DIAGNOSIS — D649 Anemia, unspecified: Secondary | ICD-10-CM | POA: Diagnosis not present

## 2023-05-26 DIAGNOSIS — N2581 Secondary hyperparathyroidism of renal origin: Secondary | ICD-10-CM | POA: Diagnosis not present

## 2023-05-26 DIAGNOSIS — N184 Chronic kidney disease, stage 4 (severe): Secondary | ICD-10-CM | POA: Diagnosis not present

## 2023-05-26 DIAGNOSIS — E782 Mixed hyperlipidemia: Secondary | ICD-10-CM | POA: Diagnosis not present

## 2023-05-26 DIAGNOSIS — Z794 Long term (current) use of insulin: Secondary | ICD-10-CM | POA: Diagnosis not present

## 2023-05-26 DIAGNOSIS — E1122 Type 2 diabetes mellitus with diabetic chronic kidney disease: Secondary | ICD-10-CM | POA: Diagnosis not present

## 2023-06-01 DIAGNOSIS — H43813 Vitreous degeneration, bilateral: Secondary | ICD-10-CM | POA: Diagnosis not present

## 2023-06-01 DIAGNOSIS — H353131 Nonexudative age-related macular degeneration, bilateral, early dry stage: Secondary | ICD-10-CM | POA: Diagnosis not present

## 2023-06-01 DIAGNOSIS — E113293 Type 2 diabetes mellitus with mild nonproliferative diabetic retinopathy without macular edema, bilateral: Secondary | ICD-10-CM | POA: Diagnosis not present

## 2023-06-02 DIAGNOSIS — D6869 Other thrombophilia: Secondary | ICD-10-CM | POA: Diagnosis not present

## 2023-06-02 DIAGNOSIS — I10 Essential (primary) hypertension: Secondary | ICD-10-CM | POA: Diagnosis not present

## 2023-06-02 DIAGNOSIS — E782 Mixed hyperlipidemia: Secondary | ICD-10-CM | POA: Diagnosis not present

## 2023-06-02 DIAGNOSIS — M48061 Spinal stenosis, lumbar region without neurogenic claudication: Secondary | ICD-10-CM | POA: Diagnosis not present

## 2023-06-02 DIAGNOSIS — N2581 Secondary hyperparathyroidism of renal origin: Secondary | ICD-10-CM | POA: Diagnosis not present

## 2023-06-02 DIAGNOSIS — Z79891 Long term (current) use of opiate analgesic: Secondary | ICD-10-CM | POA: Diagnosis not present

## 2023-06-02 DIAGNOSIS — I480 Paroxysmal atrial fibrillation: Secondary | ICD-10-CM | POA: Diagnosis not present

## 2023-06-02 DIAGNOSIS — E1122 Type 2 diabetes mellitus with diabetic chronic kidney disease: Secondary | ICD-10-CM | POA: Diagnosis not present

## 2023-06-02 DIAGNOSIS — M7989 Other specified soft tissue disorders: Secondary | ICD-10-CM | POA: Diagnosis not present

## 2023-06-02 DIAGNOSIS — M79671 Pain in right foot: Secondary | ICD-10-CM | POA: Diagnosis not present

## 2023-06-02 DIAGNOSIS — N184 Chronic kidney disease, stage 4 (severe): Secondary | ICD-10-CM | POA: Diagnosis not present

## 2023-06-02 DIAGNOSIS — I5022 Chronic systolic (congestive) heart failure: Secondary | ICD-10-CM | POA: Diagnosis not present

## 2023-06-29 ENCOUNTER — Other Ambulatory Visit: Payer: PPO

## 2023-09-22 DIAGNOSIS — E1122 Type 2 diabetes mellitus with diabetic chronic kidney disease: Secondary | ICD-10-CM | POA: Diagnosis not present

## 2023-09-22 DIAGNOSIS — Z794 Long term (current) use of insulin: Secondary | ICD-10-CM | POA: Diagnosis not present

## 2023-09-22 DIAGNOSIS — D6869 Other thrombophilia: Secondary | ICD-10-CM | POA: Diagnosis not present

## 2023-09-22 DIAGNOSIS — E782 Mixed hyperlipidemia: Secondary | ICD-10-CM | POA: Diagnosis not present

## 2023-09-22 DIAGNOSIS — Z79891 Long term (current) use of opiate analgesic: Secondary | ICD-10-CM | POA: Diagnosis not present

## 2023-09-22 DIAGNOSIS — N184 Chronic kidney disease, stage 4 (severe): Secondary | ICD-10-CM | POA: Diagnosis not present

## 2023-09-29 DIAGNOSIS — I10 Essential (primary) hypertension: Secondary | ICD-10-CM | POA: Diagnosis not present

## 2023-09-29 DIAGNOSIS — I5022 Chronic systolic (congestive) heart failure: Secondary | ICD-10-CM | POA: Diagnosis not present

## 2023-09-29 DIAGNOSIS — N2581 Secondary hyperparathyroidism of renal origin: Secondary | ICD-10-CM | POA: Diagnosis not present

## 2023-09-29 DIAGNOSIS — D6869 Other thrombophilia: Secondary | ICD-10-CM | POA: Diagnosis not present

## 2023-09-29 DIAGNOSIS — Z0000 Encounter for general adult medical examination without abnormal findings: Secondary | ICD-10-CM | POA: Diagnosis not present

## 2023-09-29 DIAGNOSIS — E782 Mixed hyperlipidemia: Secondary | ICD-10-CM | POA: Diagnosis not present

## 2023-09-29 DIAGNOSIS — E1122 Type 2 diabetes mellitus with diabetic chronic kidney disease: Secondary | ICD-10-CM | POA: Diagnosis not present

## 2023-09-29 DIAGNOSIS — N184 Chronic kidney disease, stage 4 (severe): Secondary | ICD-10-CM | POA: Diagnosis not present

## 2023-09-29 DIAGNOSIS — I480 Paroxysmal atrial fibrillation: Secondary | ICD-10-CM | POA: Diagnosis not present

## 2023-09-29 DIAGNOSIS — I890 Lymphedema, not elsewhere classified: Secondary | ICD-10-CM | POA: Diagnosis not present

## 2023-09-29 DIAGNOSIS — M5416 Radiculopathy, lumbar region: Secondary | ICD-10-CM | POA: Diagnosis not present

## 2023-10-19 ENCOUNTER — Inpatient Hospital Stay: Payer: PPO

## 2023-10-19 ENCOUNTER — Other Ambulatory Visit: Payer: Self-pay

## 2023-10-19 ENCOUNTER — Inpatient Hospital Stay
Admission: EM | Admit: 2023-10-19 | Discharge: 2023-10-28 | DRG: 291 | Disposition: A | Discharge status: Skilled Nursing Facility | Payer: PPO | Attending: Osteopathic Medicine | Admitting: Osteopathic Medicine

## 2023-10-19 DIAGNOSIS — L0390 Cellulitis, unspecified: Secondary | ICD-10-CM | POA: Diagnosis not present

## 2023-10-19 DIAGNOSIS — N281 Cyst of kidney, acquired: Secondary | ICD-10-CM | POA: Diagnosis not present

## 2023-10-19 DIAGNOSIS — Z885 Allergy status to narcotic agent status: Secondary | ICD-10-CM

## 2023-10-19 DIAGNOSIS — C642 Malignant neoplasm of left kidney, except renal pelvis: Secondary | ICD-10-CM | POA: Diagnosis not present

## 2023-10-19 DIAGNOSIS — I5023 Acute on chronic systolic (congestive) heart failure: Secondary | ICD-10-CM | POA: Diagnosis not present

## 2023-10-19 DIAGNOSIS — E11649 Type 2 diabetes mellitus with hypoglycemia without coma: Secondary | ICD-10-CM | POA: Diagnosis not present

## 2023-10-19 DIAGNOSIS — Z981 Arthrodesis status: Secondary | ICD-10-CM

## 2023-10-19 DIAGNOSIS — I447 Left bundle-branch block, unspecified: Secondary | ICD-10-CM | POA: Diagnosis not present

## 2023-10-19 DIAGNOSIS — G8929 Other chronic pain: Secondary | ICD-10-CM | POA: Diagnosis present

## 2023-10-19 DIAGNOSIS — E669 Obesity, unspecified: Secondary | ICD-10-CM | POA: Diagnosis present

## 2023-10-19 DIAGNOSIS — E0800 Diabetes mellitus due to underlying condition with hyperosmolarity without nonketotic hyperglycemic-hyperosmolar coma (NKHHC): Secondary | ICD-10-CM | POA: Diagnosis not present

## 2023-10-19 DIAGNOSIS — W19XXXA Unspecified fall, initial encounter: Secondary | ICD-10-CM | POA: Diagnosis not present

## 2023-10-19 DIAGNOSIS — Z7984 Long term (current) use of oral hypoglycemic drugs: Secondary | ICD-10-CM

## 2023-10-19 DIAGNOSIS — I4891 Unspecified atrial fibrillation: Secondary | ICD-10-CM | POA: Diagnosis not present

## 2023-10-19 DIAGNOSIS — R600 Localized edema: Secondary | ICD-10-CM | POA: Diagnosis not present

## 2023-10-19 DIAGNOSIS — Z79899 Other long term (current) drug therapy: Secondary | ICD-10-CM | POA: Diagnosis not present

## 2023-10-19 DIAGNOSIS — I059 Rheumatic mitral valve disease, unspecified: Secondary | ICD-10-CM | POA: Diagnosis present

## 2023-10-19 DIAGNOSIS — M1711 Unilateral primary osteoarthritis, right knee: Secondary | ICD-10-CM | POA: Diagnosis present

## 2023-10-19 DIAGNOSIS — Z87440 Personal history of urinary (tract) infections: Secondary | ICD-10-CM

## 2023-10-19 DIAGNOSIS — M7989 Other specified soft tissue disorders: Secondary | ICD-10-CM | POA: Diagnosis not present

## 2023-10-19 DIAGNOSIS — Z96652 Presence of left artificial knee joint: Secondary | ICD-10-CM | POA: Diagnosis not present

## 2023-10-19 DIAGNOSIS — I700 Atherosclerosis of aorta: Secondary | ICD-10-CM | POA: Diagnosis not present

## 2023-10-19 DIAGNOSIS — I5021 Acute systolic (congestive) heart failure: Secondary | ICD-10-CM | POA: Diagnosis not present

## 2023-10-19 DIAGNOSIS — I10 Essential (primary) hypertension: Secondary | ICD-10-CM | POA: Diagnosis not present

## 2023-10-19 DIAGNOSIS — I132 Hypertensive heart and chronic kidney disease with heart failure and with stage 5 chronic kidney disease, or end stage renal disease: Secondary | ICD-10-CM | POA: Diagnosis not present

## 2023-10-19 DIAGNOSIS — I2510 Atherosclerotic heart disease of native coronary artery without angina pectoris: Secondary | ICD-10-CM | POA: Diagnosis not present

## 2023-10-19 DIAGNOSIS — I480 Paroxysmal atrial fibrillation: Secondary | ICD-10-CM | POA: Diagnosis not present

## 2023-10-19 DIAGNOSIS — I509 Heart failure, unspecified: Secondary | ICD-10-CM

## 2023-10-19 DIAGNOSIS — Z992 Dependence on renal dialysis: Secondary | ICD-10-CM | POA: Diagnosis not present

## 2023-10-19 DIAGNOSIS — Z7952 Long term (current) use of systemic steroids: Secondary | ICD-10-CM

## 2023-10-19 DIAGNOSIS — N186 End stage renal disease: Secondary | ICD-10-CM | POA: Diagnosis not present

## 2023-10-19 DIAGNOSIS — E872 Acidosis, unspecified: Secondary | ICD-10-CM | POA: Diagnosis not present

## 2023-10-19 DIAGNOSIS — Z6833 Body mass index (BMI) 33.0-33.9, adult: Secondary | ICD-10-CM

## 2023-10-19 DIAGNOSIS — N132 Hydronephrosis with renal and ureteral calculous obstruction: Secondary | ICD-10-CM | POA: Diagnosis not present

## 2023-10-19 DIAGNOSIS — N179 Acute kidney failure, unspecified: Secondary | ICD-10-CM | POA: Diagnosis not present

## 2023-10-19 DIAGNOSIS — R601 Generalized edema: Secondary | ICD-10-CM | POA: Diagnosis not present

## 2023-10-19 DIAGNOSIS — I959 Hypotension, unspecified: Secondary | ICD-10-CM | POA: Diagnosis not present

## 2023-10-19 DIAGNOSIS — Z471 Aftercare following joint replacement surgery: Secondary | ICD-10-CM | POA: Diagnosis not present

## 2023-10-19 DIAGNOSIS — Z794 Long term (current) use of insulin: Secondary | ICD-10-CM

## 2023-10-19 DIAGNOSIS — Z87442 Personal history of urinary calculi: Secondary | ICD-10-CM

## 2023-10-19 DIAGNOSIS — M79605 Pain in left leg: Secondary | ICD-10-CM | POA: Diagnosis not present

## 2023-10-19 DIAGNOSIS — E1122 Type 2 diabetes mellitus with diabetic chronic kidney disease: Secondary | ICD-10-CM | POA: Diagnosis not present

## 2023-10-19 DIAGNOSIS — E785 Hyperlipidemia, unspecified: Secondary | ICD-10-CM | POA: Diagnosis not present

## 2023-10-19 DIAGNOSIS — Z466 Encounter for fitting and adjustment of urinary device: Secondary | ICD-10-CM | POA: Diagnosis not present

## 2023-10-19 DIAGNOSIS — Z881 Allergy status to other antibiotic agents status: Secondary | ICD-10-CM

## 2023-10-19 DIAGNOSIS — E8770 Fluid overload, unspecified: Secondary | ICD-10-CM | POA: Diagnosis not present

## 2023-10-19 DIAGNOSIS — I890 Lymphedema, not elsewhere classified: Secondary | ICD-10-CM | POA: Diagnosis present

## 2023-10-19 DIAGNOSIS — E88819 Insulin resistance, unspecified: Secondary | ICD-10-CM | POA: Diagnosis present

## 2023-10-19 DIAGNOSIS — L03116 Cellulitis of left lower limb: Secondary | ICD-10-CM | POA: Diagnosis not present

## 2023-10-19 DIAGNOSIS — L03115 Cellulitis of right lower limb: Secondary | ICD-10-CM | POA: Diagnosis not present

## 2023-10-19 DIAGNOSIS — K8689 Other specified diseases of pancreas: Secondary | ICD-10-CM | POA: Diagnosis not present

## 2023-10-19 DIAGNOSIS — I5041 Acute combined systolic (congestive) and diastolic (congestive) heart failure: Secondary | ICD-10-CM | POA: Diagnosis not present

## 2023-10-19 DIAGNOSIS — I120 Hypertensive chronic kidney disease with stage 5 chronic kidney disease or end stage renal disease: Secondary | ICD-10-CM | POA: Diagnosis not present

## 2023-10-19 DIAGNOSIS — N1330 Unspecified hydronephrosis: Secondary | ICD-10-CM | POA: Diagnosis not present

## 2023-10-19 DIAGNOSIS — I5043 Acute on chronic combined systolic (congestive) and diastolic (congestive) heart failure: Secondary | ICD-10-CM | POA: Diagnosis present

## 2023-10-19 DIAGNOSIS — N189 Chronic kidney disease, unspecified: Secondary | ICD-10-CM | POA: Diagnosis not present

## 2023-10-19 DIAGNOSIS — R339 Retention of urine, unspecified: Secondary | ICD-10-CM | POA: Diagnosis not present

## 2023-10-19 DIAGNOSIS — D631 Anemia in chronic kidney disease: Secondary | ICD-10-CM | POA: Diagnosis present

## 2023-10-19 DIAGNOSIS — R739 Hyperglycemia, unspecified: Secondary | ICD-10-CM | POA: Diagnosis not present

## 2023-10-19 DIAGNOSIS — Z7901 Long term (current) use of anticoagulants: Secondary | ICD-10-CM

## 2023-10-19 DIAGNOSIS — N185 Chronic kidney disease, stage 5: Secondary | ICD-10-CM | POA: Diagnosis not present

## 2023-10-19 DIAGNOSIS — E1165 Type 2 diabetes mellitus with hyperglycemia: Secondary | ICD-10-CM | POA: Diagnosis present

## 2023-10-19 DIAGNOSIS — Z7401 Bed confinement status: Secondary | ICD-10-CM | POA: Diagnosis not present

## 2023-10-19 DIAGNOSIS — N2889 Other specified disorders of kidney and ureter: Secondary | ICD-10-CM | POA: Diagnosis not present

## 2023-10-19 DIAGNOSIS — R2689 Other abnormalities of gait and mobility: Secondary | ICD-10-CM | POA: Diagnosis not present

## 2023-10-19 DIAGNOSIS — Z888 Allergy status to other drugs, medicaments and biological substances status: Secondary | ICD-10-CM

## 2023-10-19 DIAGNOSIS — M549 Dorsalgia, unspecified: Secondary | ICD-10-CM | POA: Diagnosis present

## 2023-10-19 DIAGNOSIS — M79604 Pain in right leg: Secondary | ICD-10-CM | POA: Diagnosis not present

## 2023-10-19 DIAGNOSIS — E790 Hyperuricemia without signs of inflammatory arthritis and tophaceous disease: Secondary | ICD-10-CM | POA: Diagnosis not present

## 2023-10-19 DIAGNOSIS — Z9181 History of falling: Secondary | ICD-10-CM | POA: Diagnosis not present

## 2023-10-19 DIAGNOSIS — R7881 Bacteremia: Secondary | ICD-10-CM | POA: Diagnosis not present

## 2023-10-19 DIAGNOSIS — M6281 Muscle weakness (generalized): Secondary | ICD-10-CM | POA: Diagnosis not present

## 2023-10-19 LAB — BASIC METABOLIC PANEL
Anion gap: 13 (ref 5–15)
BUN: 82 mg/dL — ABNORMAL HIGH (ref 8–23)
CO2: 23 mmol/L (ref 22–32)
Calcium: 8.9 mg/dL (ref 8.9–10.3)
Chloride: 99 mmol/L (ref 98–111)
Creatinine, Ser: 3.11 mg/dL — ABNORMAL HIGH (ref 0.44–1.00)
GFR, Estimated: 14 mL/min — ABNORMAL LOW (ref >60)
Glucose, Bld: 263 mg/dL — ABNORMAL HIGH (ref 70–99)
Potassium: 4.2 mmol/L (ref 3.5–5.1)
Sodium: 135 mmol/L (ref 135–145)

## 2023-10-19 LAB — CBC
HCT: 40.8 % (ref 36.0–46.0)
Hemoglobin: 12.8 g/dL (ref 12.0–15.0)
MCH: 26.9 pg (ref 26.0–34.0)
MCHC: 31.4 g/dL (ref 30.0–36.0)
MCV: 85.9 fL (ref 80.0–100.0)
Platelets: 253 10*3/uL (ref 150–400)
RBC: 4.75 MIL/uL (ref 3.87–5.11)
RDW: 15.9 % — ABNORMAL HIGH (ref 11.5–15.5)
WBC: 15.3 10*3/uL — ABNORMAL HIGH (ref 4.0–10.5)
nRBC: 0 % (ref 0.0–0.2)

## 2023-10-19 LAB — CBG MONITORING, ED
Glucose-Capillary: 259 mg/dL — ABNORMAL HIGH (ref 70–99)
Glucose-Capillary: 178 mg/dL — ABNORMAL HIGH (ref 70–99)

## 2023-10-19 LAB — LACTIC ACID, PLASMA
Lactic Acid, Venous: 3 mmol/L (ref 0.5–1.9)
Lactic Acid, Venous: 2 mmol/L (ref 0.5–1.9)

## 2023-10-19 LAB — URIC ACID: Uric Acid, Serum: 11.1 mg/dL — ABNORMAL HIGH (ref 2.5–7.1)

## 2023-10-19 LAB — GLUCOSE, CAPILLARY
Glucose-Capillary: 69 mg/dL — ABNORMAL LOW (ref 70–99)
Glucose-Capillary: 87 mg/dL (ref 70–99)

## 2023-10-19 LAB — CK: Total CK: 16 U/L — ABNORMAL LOW (ref 38–234)

## 2023-10-19 LAB — GROUP A STREP BY PCR: Group A Strep by PCR: NOT DETECTED

## 2023-10-19 MED ORDER — INSULIN ASPART 100 UNIT/ML IJ SOLN
0.0000 [IU] | Freq: Every day | INTRAMUSCULAR | Status: DC
  Administered 2023-10-24: 2 [IU] via SUBCUTANEOUS
  Administered 2023-10-25: 4 [IU] via SUBCUTANEOUS
  Administered 2023-10-26: 2 [IU] via SUBCUTANEOUS
  Administered 2023-10-27: 5 [IU] via SUBCUTANEOUS
  Filled 2023-10-19 (×4): qty 1

## 2023-10-19 MED ORDER — ALBUMIN HUMAN 25 % IV SOLN
25.0000 g | Freq: Two times a day (BID) | INTRAVENOUS | Status: DC
  Administered 2023-10-19 – 2023-10-21 (×5): 25 g via INTRAVENOUS
  Filled 2023-10-19 (×4): qty 100

## 2023-10-19 MED ORDER — CEFTRIAXONE SODIUM 2 G IJ SOLR
2.0000 g | Freq: Once | INTRAMUSCULAR | Status: AC
  Administered 2023-10-19: 2 g via INTRAVENOUS
  Filled 2023-10-19: qty 20

## 2023-10-19 MED ORDER — PREDNISONE 10 MG PO TABS: 5.0000 mg | ORAL_TABLET | ORAL | Status: DC

## 2023-10-19 MED ORDER — SODIUM CHLORIDE 0.9 % IV SOLN
2.0000 g | INTRAVENOUS | Status: DC
  Administered 2023-10-20 – 2023-10-25 (×5): 2 g via INTRAVENOUS
  Filled 2023-10-19 (×6): qty 20

## 2023-10-19 MED ORDER — SODIUM CHLORIDE 0.9% FLUSH: 3.0000 mL | INTRAVENOUS | Status: DC | PRN

## 2023-10-19 MED ORDER — METOPROLOL SUCCINATE ER 25 MG PO TB24
12.5000 mg | ORAL_TABLET | Freq: Two times a day (BID) | ORAL | Status: DC
  Administered 2023-10-19 – 2023-10-27 (×15): 12.5 mg via ORAL
  Filled 2023-10-19 (×17): qty 1

## 2023-10-19 MED ORDER — ALLOPURINOL 100 MG PO TABS
50.0000 mg | ORAL_TABLET | Freq: Every day | ORAL | Status: DC
  Administered 2023-10-19 – 2023-10-28 (×9): 50 mg via ORAL
  Filled 2023-10-19 (×7): qty 1
  Filled 2023-10-19: qty 0.5
  Filled 2023-10-19: qty 1

## 2023-10-19 MED ORDER — SODIUM CHLORIDE 0.9% FLUSH
3.0000 mL | Freq: Two times a day (BID) | INTRAVENOUS | Status: DC
  Administered 2023-10-19 – 2023-10-28 (×15): 3 mL via INTRAVENOUS

## 2023-10-19 MED ORDER — FUROSEMIDE 10 MG/ML IJ SOLN
40.0000 mg | Freq: Two times a day (BID) | INTRAMUSCULAR | Status: DC
  Administered 2023-10-19: 40 mg via INTRAVENOUS
  Filled 2023-10-19: qty 4

## 2023-10-19 MED ORDER — LINAGLIPTIN 5 MG PO TABS
5.0000 mg | ORAL_TABLET | Freq: Every day | ORAL | Status: DC
  Administered 2023-10-19 – 2023-10-28 (×9): 5 mg via ORAL
  Filled 2023-10-19 (×11): qty 1

## 2023-10-19 MED ORDER — METOPROLOL SUCCINATE ER 50 MG PO TB24: 25.0000 mg | ORAL_TABLET | Freq: Two times a day (BID) | ORAL | Status: DC

## 2023-10-19 MED ORDER — FUROSEMIDE 10 MG/ML IJ SOLN
5.0000 mg/h | INTRAVENOUS | Status: DC
  Administered 2023-10-19 – 2023-10-21 (×2): 5 mg/h via INTRAVENOUS
  Filled 2023-10-19 (×2): qty 20

## 2023-10-19 MED ORDER — PREDNISONE 10 MG PO TABS
5.0000 mg | ORAL_TABLET | Freq: Every day | ORAL | Status: AC
  Administered 2023-10-21: 5 mg via ORAL
  Filled 2023-10-19: qty 1

## 2023-10-19 MED ORDER — PREDNISONE 10 MG PO TABS
10.0000 mg | ORAL_TABLET | Freq: Every day | ORAL | Status: AC
  Administered 2023-10-20: 10 mg via ORAL
  Filled 2023-10-19: qty 1

## 2023-10-19 MED ORDER — ACETAMINOPHEN 500 MG PO TABS
500.0000 mg | ORAL_TABLET | Freq: Four times a day (QID) | ORAL | Status: DC | PRN
  Administered 2023-10-19: 500 mg via ORAL
  Filled 2023-10-19: qty 1

## 2023-10-19 MED ORDER — INSULIN GLARGINE-YFGN 100 UNIT/ML ~~LOC~~ SOLN
15.0000 [IU] | Freq: Every day | SUBCUTANEOUS | Status: DC
  Administered 2023-10-19 – 2023-10-20 (×2): 15 [IU] via SUBCUTANEOUS
  Filled 2023-10-19 (×2): qty 0.15

## 2023-10-19 MED ORDER — ACETAMINOPHEN 325 MG PO TABS: 650.0000 mg | ORAL_TABLET | ORAL | Status: DC | PRN

## 2023-10-19 MED ORDER — INSULIN ASPART 100 UNIT/ML IJ SOLN
0.0000 [IU] | Freq: Three times a day (TID) | INTRAMUSCULAR | Status: DC
  Administered 2023-10-19: 11 [IU] via SUBCUTANEOUS
  Administered 2023-10-19: 4 [IU] via SUBCUTANEOUS
  Filled 2023-10-19 (×2): qty 1

## 2023-10-19 MED ORDER — AMIODARONE HCL 200 MG PO TABS
200.0000 mg | ORAL_TABLET | Freq: Every day | ORAL | Status: DC
  Administered 2023-10-19 – 2023-10-28 (×9): 200 mg via ORAL
  Filled 2023-10-19 (×9): qty 1

## 2023-10-19 MED ORDER — PREDNISONE 10 MG PO TABS
15.0000 mg | ORAL_TABLET | Freq: Every day | ORAL | Status: AC
  Administered 2023-10-19: 15 mg via ORAL
  Filled 2023-10-19: qty 2

## 2023-10-19 MED ORDER — RIVAROXABAN 15 MG PO TABS
15.0000 mg | ORAL_TABLET | Freq: Every day | ORAL | Status: DC
  Administered 2023-10-19 – 2023-10-24 (×6): 15 mg via ORAL
  Filled 2023-10-19 (×8): qty 1

## 2023-10-19 MED ORDER — SODIUM CHLORIDE 0.9% FLUSH
10.0000 mL | Freq: Two times a day (BID) | INTRAVENOUS | Status: DC
  Administered 2023-10-19 – 2023-10-28 (×15): 10 mL via INTRAVENOUS

## 2023-10-19 MED ORDER — POLYETHYLENE GLYCOL 3350 17 G PO PACK
8.5000 g | PACK | Freq: Every day | ORAL | Status: DC | PRN
Start: 2023-10-19 — End: 2023-10-28
  Administered 2023-10-23: 17 g via ORAL
  Filled 2023-10-19: qty 1

## 2023-10-19 MED ORDER — HYDROCODONE-ACETAMINOPHEN 5-325 MG PO TABS
1.0000 | ORAL_TABLET | Freq: Four times a day (QID) | ORAL | Status: DC | PRN
  Administered 2023-10-19 – 2023-10-27 (×6): 1 via ORAL
  Filled 2023-10-19 (×6): qty 1

## 2023-10-19 MED ORDER — ONDANSETRON HCL 4 MG/2ML IJ SOLN: 4.0000 mg | Freq: Four times a day (QID) | INTRAMUSCULAR | Status: DC | PRN

## 2023-10-19 NOTE — ED Notes (Signed)
See triage note  Presents with bilateral leg swelling and redness  Family states she has has swelling and redness since she had knee replaced

## 2023-10-19 NOTE — ED Provider Triage Note (Signed)
Emergency Medicine Provider Triage Evaluation Note  Kejuana Conyers , a 89 y.o. female  was evaluated in triage.  Pt complains of cellulitis to left lower leg, swelling. Husband is a retired Development worker, community, states she has been decreasing a steroid dose, cellulitis has worsened, has hx of renal disease.  Review of Systems  Positive:  Negative:   Physical Exam  BP 109/61   Pulse 85   Temp 97.8 F (36.6 C)   Resp 18   Ht 5\' 4"  (1.626 m)   Wt 89.8 kg   SpO2 100%   BMI 33.99 kg/m  Gen:   Awake, no distress   Resp:  Normal effort  MSK:   Moves extremities without difficulty  Other:    Medical Decision Making  Medically screening exam initiated at 8:29 AM.  Appropriate orders placed.  Lillith Fleischer was informed that the remainder of the evaluation will be completed by another provider, this initial triage assessment does not replace that evaluation, and the importance of remaining in the ED until their evaluation is complete.     Faythe Ghee, PA-C 10/19/23 0830

## 2023-10-19 NOTE — ED Triage Notes (Signed)
Pt to ED [facility] from [facility] for bilateral lower leg cellulitis. EMS originally called out for fall, husband assisted pt to ground.  Significant swelling noted to bilateral lower extremities.  Recently finished antibiotics.

## 2023-10-19 NOTE — Consult Note (Signed)
[facility]  CONSULT NOTE    Date: 10/19/2023                  Patient Name:  Tiffany [name]  MRN: [id number]  DOB: 06-16-1933  Age / Sex: 89 y.o., female         PCP: Lynnea Ferrier, MD                 Service Requesting Consult: [facility]                 Reason for Consult: Acute kidney injury on chronic kidney diseaese            History of Present Illness: Tiffany [name] is a 89 y.o.  female with past medical history including chronic heart failure, A-fib with recent cardioversion on Xarelto, CAD, and chronic kidney disease stage IV-5, who was admitted to [facility] on 10/19/2023 for CHF (congestive heart failure) (HCC) [I50.9]  Patient presents to the emergency room complaining of lower extremity edema and pain.  She is seen resting in bed, husband at bedside.  Husband states lower extremity edema has always been present but has worsened over the last week or so.  Patient states pain began 1 to 2 days prior.  Denies fever or chills.  Reports mild nausea overnight without vomiting.  Denies diarrhea.  Denies NSAID use.  Has seen [facility] nephrology in the past but felt kidneys were not improving, was lost to follow-up.  States she was recently diagnosed with lower extremity cellulitis that was treated with Keflex.  Labs on ED arrival significant for glucose 263, BUN 82, creatinine 3.11 with GFR 14, uric acid 11.1, CK16, lactic acid 3.0, and elevated WBCs 15.3.  Blood cultures pending.  Chest x-ray negative for acute findings.  Bilateral lower extremities x-rays show edema and swelling with osteoarthritis.   Medications: Outpatient medications: (Not in a hospital admission)   Current medications: Current Facility-Administered Medications  Medication Dose Route Frequency Provider Last Rate Last Admin   acetaminophen (TYLENOL) tablet 500 mg  500 mg Oral Q6H PRN Emeline General, MD       albumin human 25 % solution 25 g  25 g Intravenous BID Wendee Beavers, NP        allopurinol (ZYLOPRIM) tablet 50 mg  50 mg Oral Daily Mikey College T, MD   50 mg at 10/19/23 1343   amiodarone (PACERONE) tablet 200 mg  200 mg Oral Daily Mikey College T, MD   200 mg at 10/19/23 1136   [START ON 10/20/2023] cefTRIAXone (ROCEPHIN) 2 g in sodium chloride 0.9 % 100 mL IVPB  2 g Intravenous Q24H Mikey College T, MD       furosemide (LASIX) 200 mg in dextrose 5 % 100 mL (2 mg/mL) infusion  5 mg/hr Intravenous Continuous Trinita Devlin, NP       HYDROcodone-acetaminophen (NORCO/VICODIN) 5-325 MG per tablet 1 tablet  1 tablet Oral Q6H PRN Mikey College T, MD       insulin aspart (novoLOG) injection 0-20 Units  0-20 Units Subcutaneous TID WC Mikey College T, MD   11 Units at 10/19/23 1344   insulin aspart (novoLOG) injection 0-5 Units  0-5 Units Subcutaneous QHS Mikey College T, MD       insulin glargine-yfgn [facility]) injection 15 Units  15 Units Subcutaneous Daily Mikey College T, MD   15 Units at 10/19/23 1146   linagliptin (TRADJENTA) tablet 5 mg  5 mg Oral  Daily Mikey College T, MD   5 mg at 10/19/23 1140   metoprolol succinate (TOPROL-XL) 24 hr tablet 12.5 mg  12.5 mg Oral BID Mikey College T, MD   12.5 mg at 10/19/23 1136   ondansetron (ZOFRAN) injection 4 mg  4 mg Intravenous Q6H PRN Emeline General, MD       polyethylene glycol (MIRALAX / GLYCOLAX) packet 8.5-17 g  8.5-17 g Oral Daily PRN Emeline General, MD       [START ON 10/20/2023] predniSONE (DELTASONE) tablet 10 mg  10 mg Oral Q breakfast Mikey College T, MD       Followed by   Melene Muller ON 10/21/2023] predniSONE (DELTASONE) tablet 5 mg  5 mg Oral Q breakfast Emeline General, MD       Rivaroxaban Carlena Hurl) tablet 15 mg  15 mg Oral Q supper Jaynie Bream, RPH       sodium chloride flush (NS) 0.9 % injection 10 mL  10 mL Intravenous Q12H Mikey College T, MD   10 mL at 10/19/23 1145   sodium chloride flush (NS) 0.9 % injection 3 mL  3 mL Intravenous Q12H Mikey College T, MD   3 mL at 10/19/23 1145   sodium chloride flush (NS) 0.9 % injection 3 mL   3 mL Intravenous PRN Emeline General, MD       Current Outpatient Medications  Medication Sig Dispense Refill   acetaminophen (TYLENOL) 500 MG tablet Take 500 mg by mouth 3 (three) times daily.     amiodarone (PACERONE) 200 MG tablet Take 200 mg by mouth daily.     glimepiride (AMARYL) 2 MG tablet Take 2 mg by mouth daily with breakfast.     glucose blood (PRECISION QID TEST) test strip Use 2 (two) times daily Use as instructed.     HYDROcodone-acetaminophen (NORCO) 5-325 MG tablet Take 1 tablet by mouth every 6 (six) hours as needed for up to 6 doses for moderate pain. (Patient taking differently: Take 1 tablet by mouth every 6 (six) hours as needed for moderate pain (pain score 4-6). Takes 1-2 daily for pain) 6 tablet 0   insulin glargine, 1 Unit Dial, (TOUJEO SOLOSTAR) 300 UNIT/ML Solostar Pen Inject 10-14 Units into the skin daily.     Insulin Pen Needle (BD PEN NEEDLE NANO U/F) 32G X 4 MM MISC USE WITH INSULIN PEN TWICE DAILY OR AS DIRECTED     metoprolol succinate (TOPROL-XL) 25 MG 24 hr tablet Take 12.5 mg by mouth daily.     NOVOLOG FLEXPEN 100 UNIT/ML FlexPen Inject 4-6 Units into the skin daily before supper. Sliding scale     polyethylene glycol (MIRALAX / GLYCOLAX) 17 g packet Take 8.5-17 g by mouth daily as needed for moderate constipation. Takes at bedtime     predniSONE (DELTASONE) 5 MG tablet Take 5-30 mg by mouth See admin instructions. 30 mg x1 , 25 mg x 1, 20 mg x 1, 15 mg today 10/22, 10mg  tomorrow 10/23, 5 mg on Thursday then stop     rivaroxaban (XARELTO) 15 MG TABS tablet Take 1 tablet (15 mg total) by mouth daily with breakfast. 90 tablet 0   torsemide (DEMADEX) 20 MG tablet Take 0.5 tablets (10 mg total) by mouth daily. (Patient taking differently: Take 20 mg by mouth daily. Patient taking daily)        Allergies: Allergies  Allergen Reactions   Amlodipine Swelling   Codeine Nausea And Vomiting    Tolerates low doses  hydrocodone   Tizanidine     Didn't feel good  taking it   Doxycycline Nausea And Vomiting      Past Medical History: Past Medical History:  Diagnosis Date   Anemia    Aortic atherosclerosis (HCC)    Arthritis    CAD (coronary artery disease) 02/27/2015   a.) MPI 02/27/2015: EF 62%, small/mild area of septal hypoperfusion with borderline partial redistribution; equivocal study; b.) LHC 03/13/2015: LVEDP 16 mmHg; 10% mLAD, 10% RCA --> no intervention required (med mgmt); c.) MPI 03/16/2019: EF 56%; normal study.   Cataracts, both eyes    Cervical disc disease    a.) s/p cervical fusion   Chickenpox    CKD (chronic kidney disease), stage IV (HCC)    Complication of anesthesia    a.) remote h/o PONV; b.) difficult intubation related to remote cervical fusion   Difficult intubation    a.) secondary to remote cervical fusion; reports severe pain s/p being intubated   Dyspnea on exertion    HFrEF (heart failure with reduced ejection fraction) (HCC)    a.) TTE 01/11/2014: EF >55%, mod LVH, MAC, mild RAE, mild MR/TR, sev PR; b.) TTE 11/14/2018: EF 40%, sep and inf HK, mild LAE, triv P, mod MR/TR, G2DD; c.) TTE 11/15/2020: EF 45%, sep HK, mild LVH, triv AR/PR, mild MR/TR, d.) TTE 10/28/2021: EF 45%, mild glob HK, mild LVH, triv AR/PR, mild MR, mod TR; e.) TTE 11/06/2021: EF 30-35%, mid apical and anteroseptal HK, mild TR, mod MR, G2DD.   History of recurrent UTI (urinary tract infection)    Hyperlipidemia    Hypertension    Incontinence in female    wears pads   LBBB (left bundle branch block)    Long term (current) use of anticoagulants    a.) rivaroxaban   Long-term current use of opiate analgesic    a.) hydrocodone/APAP (Norco)   Lumbar disc disease    a.) s/p lumbar interbody fusion L2-L5   PAF (paroxysmal atrial fibrillation) (HCC)    a.) CHA2DS2VASc = 7 (age x2, sex, HFrEF, HTN, vascular diasease, T2DM); b.) s/p DCCV 01/12/2017 (150 J x 1) and 11/04/2021 (120 J x 1); c.) rate/rhythm maintained on oral amiodarone + metoprolol  succinate; chronically anticoagulated with rivaroxaban   PONV (postoperative nausea and vomiting)    with 1st pregnancy 50 years ago and no problem since then   Right leg weakness    Sciatica    Spinal stenosis of lumbar region    Type 2 diabetes mellitus treated with insulin (HCC)    Ventral hernia      Past Surgical History: Past Surgical History:  Procedure Laterality Date   CARDIOVERSION N/A 11/04/2021   Procedure: CARDIOVERSION;  Surgeon: Dalia Heading, MD;  Location: [facility];  Service: Cardiovascular;  Laterality: N/A;   CARDIOVERSION N/A 03/17/2023   Procedure: CARDIOVERSION;  Surgeon: Marcina Millard, MD;  Location: [facility];  Service: Cardiovascular;  Laterality: N/A;   CATARACT EXTRACTION, BILATERAL     CERVICAL LAMINECTOMY  1996   CYSTOSCOPY W/ RETROGRADES  12/06/2020   Procedure: CYSTOSCOPY WITH RETROGRADE PYELOGRAM;  Surgeon: Sondra Come, MD;  Location: [facility];  Service: Urology;;   CYSTOSCOPY W/ URETERAL STENT PLACEMENT Left 04/10/2022   Procedure: CYSTOSCOPY WITH STENT REPLACEMENT;  Surgeon: Sondra Come, MD;  Location: [facility];  Service: Urology;  Laterality: Left;   CYSTOSCOPY W/ URETERAL STENT PLACEMENT Left 03/12/2023   Procedure: CYSTOSCOPY WITH STENT EXCHANGE;  Surgeon: Sondra Come, MD;  Location: [facility];  Service: Urology;  Laterality: Left;   CYSTOSCOPY WITH STENT PLACEMENT Left 12/06/2020   Procedure: CYSTOSCOPY WITH STENT PLACEMENT;  Surgeon: Sondra Come, MD;  Location: [facility];  Service: Urology;  Laterality: Left;   ELECTROPHYSIOLOGIC STUDY N/A 01/12/2017   Procedure: Cardioversion;  Surgeon: Dalia Heading, MD;  Location: [facility];  Service: Cardiovascular;  Laterality: N/A;   INSERTION OF MESH N/A 08/27/2022   Procedure: INSERTION OF MESH;  Surgeon: Sung Amabile, DO;  Location: [facility];  Service: General;  Laterality: N/A;   JOINT REPLACEMENT     LATERAL FUSION LUMBAR SPINE, TRANSVERSE  01/2010   L2-L5   LEFT HEART CATH  AND CORONARY ANGIOGRAPHY Left 03/13/2015   Procedure: LEFT HEART CATH AND CORONARY ANGIOGRAPHY; Location: [facility]; Surgeon: Harold Hedge, MD   ROTATOR CUFF REPAIR Right 2010   TONSILLECTOMY     TOTAL KNEE ARTHROPLASTY Left 05/23/2019   Procedure: TOTAL KNEE ARTHROPLASTY - LEFT - DIABETIC;  Surgeon: Christena Flake, MD;  Location: [facility];  Service: Orthopedics;  Laterality: Left;   URETEROSCOPY N/A 12/06/2020   Procedure: DIAGNOSTIC URETEROSCOPY;  Surgeon: Sondra Come, MD;  Location: [facility];  Service: Urology;  Laterality: N/A;   XI ROBOTIC ASSISTED VENTRAL HERNIA N/A 08/27/2022   Procedure: XI ROBOTIC ASSISTED VENTRAL HERNIA;  Surgeon: Sung Amabile, DO;  Location: [facility];  Service: General;  Laterality: N/A;     Family History: Family History  Adopted: Yes     Social History: Social History   Socioeconomic History   Marital status: Married    Spouse name: Molly Maduro   Number of children: Not on file   Years of education: Not on file   Highest education level: Not on file  Occupational History   Not on file  Tobacco Use   Smoking status: Never    Passive exposure: Never   Smokeless tobacco: Never  Vaping Use   Vaping status: Never Used  Substance and Sexual Activity   Alcohol use: Yes    Alcohol/week: 1.0 standard drink of alcohol    Types: 1 Glasses of wine per week    Comment: RARE   Drug use: Never   Sexual activity: Not Currently    Birth control/protection: Post-menopausal  Other Topics Concern   Not on file  Social History Narrative   Not on file   Social Determinants of Health   Financial Resource Strain: Low Risk  (09/29/2023)   Received from [facility]   Overall Financial Resource Strain (CARDIA)    Difficulty of Paying Living Expenses: Not hard at all  Food Insecurity: No Food Insecurity (10/19/2023)   Hunger Vital Sign    Worried About Running Out of Food in the Last Year: Never true    Ran Out of Food in the Last Year: Never true   Transportation Needs: No Transportation Needs (10/19/2023)   PRAPARE - Administrator, Civil Service (Medical): No    Lack of Transportation (Non-Medical): No  Physical Activity: Not on file  Stress: Not on file  Social Connections: Not on file  Intimate Partner Violence: Not At Risk (10/19/2023)   Humiliation, Afraid, Rape, and Kick questionnaire    Fear of Current or Ex-Partner: No    Emotionally Abused: No    Physically Abused: No    Sexually Abused: No     Review of Systems: Review of Systems  Constitutional:  Negative for chills, fever and malaise/fatigue.  HENT:  Negative for congestion,  sore throat and tinnitus.   Eyes:  Negative for blurred vision and redness.  Respiratory:  Negative for cough, shortness of breath and wheezing.   Cardiovascular:  Positive for leg swelling. Negative for chest pain, palpitations and claudication.  Gastrointestinal:  Negative for abdominal pain, blood in stool, diarrhea, nausea and vomiting.  Genitourinary:  Negative for flank pain, frequency and hematuria.  Musculoskeletal:  Positive for falls. Negative for back pain and myalgias.  Skin:  Negative for rash.  Neurological:  Negative for dizziness, weakness and headaches.  Endo/Heme/Allergies:  Does not bruise/bleed easily.  Psychiatric/Behavioral:  Negative for depression. The patient is not nervous/anxious and does not have insomnia.     Vital Signs: Blood pressure (!) 137/105, pulse 82, temperature 97.8 F (36.6 C), resp. rate 18, height 5\' 4"  (1.626 m), weight 89.8 kg, SpO2 100%.  Weight trends: Filed Weights   10/19/23 0822  Weight: 89.8 kg    Physical Exam: General: NAD  Head: Normocephalic, atraumatic. Moist oral mucosal membranes  Eyes: Anicteric  Lungs:  Clear to auscultation, normal effort, room air  Heart: Regular rate and rhythm  Abdomen:  Soft, nontender, obese  Extremities:  3+ BLE peripheral edema.  Neurologic: Nonfocal, moving all four extremities   Skin: BLE erythema  Access: None     Lab results: Basic Metabolic Panel: Recent Labs  Lab 10/19/23 0824  NA 135  K 4.2  CL 99  CO2 23  GLUCOSE 263*  BUN 82*  CREATININE 3.11*  CALCIUM 8.9    Liver Function Tests: No results for input(s): "AST", "ALT", "ALKPHOS", "BILITOT", "PROT", "ALBUMIN" in the last 168 hours. No results for input(s): "LIPASE", "AMYLASE" in the last 168 hours. No results for input(s): "AMMONIA" in the last 168 hours.  CBC: Recent Labs  Lab 10/19/23 0824  WBC 15.3*  HGB 12.8  HCT 40.8  MCV 85.9  PLT 253    Cardiac Enzymes: Recent Labs  Lab 10/19/23 0827  CKTOTAL 16*    BNP: Invalid input(s): "POCBNP"  CBG: Recent Labs  Lab 10/19/23 1304  GLUCAP 259*    Microbiology: Results for orders placed or performed in visit on 03/30/22  CULTURE, URINE COMPREHENSIVE     Status: None   Collection Time: 03/30/22 10:04 AM   Specimen: Urine   UR  Result Value Ref Range Status   Urine Culture, Comprehensive Final report  Final   Organism ID, Bacteria Comment  Final    Comment: Mixed urogenital flora 10,000-25,000 colony forming units per mL   Microscopic Examination     Status: Abnormal   Collection Time: 03/30/22 10:04 AM   Urine  Result Value Ref Range Status   WBC, UA 6-10 (A) 0 - 5 /hpf Final   RBC, Urine >30 (H) 0 - 2 /hpf Final   Epithelial Cells (non renal) 0-10 0 - 10 /hpf Final   Bacteria, UA Many (A) None seen/Few Final    Coagulation Studies: No results for input(s): "LABPROT", "INR" in the last 72 hours.  Urinalysis: No results for input(s): "COLORURINE", "LABSPEC", "PHURINE", "GLUCOSEU", "HGBUR", "BILIRUBINUR", "KETONESUR", "PROTEINUR", "UROBILINOGEN", "NITRITE", "LEUKOCYTESUR" in the last 72 hours.  Invalid input(s): "APPERANCEUR"    Imaging: DG Tibia/Fibula Right  Result Date: 10/19/2023 CLINICAL DATA:  Cellulitis.  Bilateral swelling and edema. EXAM: RIGHT TIBIA AND FIBULA - 2 VIEW COMPARISON:  None Available.  FINDINGS: There is diffuse moderate subcutaneous fat edema and swelling. Additional large patient body habitus. Mildly decreased bone mineralization. Severe medial compartment of the knee joint space  narrowing with mild peripheral osteophytosis. Moderate to severe patellofemoral joint space narrowing with moderate inferior and mild superior patellar degenerative osteophytes. Mild tibiotalar joint space narrowing. No acute fracture or dislocation. Moderate tarsometatarsal joint space narrowing on lateral view. Moderate atherosclerotic calcifications. IMPRESSION: 1. Diffuse moderate subcutaneous fat edema and swelling. 2. Severe medial compartment and moderate to severe patellofemoral compartment osteoarthritis of the right knee. Electronically Signed   By: Neita Garnet M.D.   On: 10/19/2023 13:56   DG Tibia/Fibula Left  Result Date: 10/19/2023 CLINICAL DATA:  Bilateral leg swelling and edema.  Cellulitis. EXAM: LEFT TIBIA AND FIBULA - 2 VIEW COMPARISON:  Left knee radiographs 05/23/2019 FINDINGS: Status post total left knee arthroplasty. Interval removal of the prior anterior surgical skin staples on the prior radiographs immediately following surgery. Resolution of the prior postsurgical intra-articular air of the left knee. No left knee joint effusion. No perihardware lucency is seen to indicate hardware failure or loosening. No acute fracture or dislocation. Mild tibiotalar osteoarthritis. Moderate tarsometatarsal joint space narrowing, subchondral sclerosis, subchondral cystic change, peripheral osteophytosis diffusely. Moderate diffuse subcutaneous fat edema and swelling. Moderate atherosclerotic calcifications. IMPRESSION: 1. Status post total left knee arthroplasty without evidence of hardware failure or loosening. 2. Moderate partially visualized tarsometatarsal osteoarthritis. 3. Moderate diffuse subcutaneous fat edema and swelling consistent with reported cellulitis. Electronically Signed   By: Neita Garnet M.D.   On: 10/19/2023 13:55   DG Chest 1 View  Result Date: 10/19/2023 CLINICAL DATA:  CHF EXAM: CHEST  1 VIEW COMPARISON:  Chest x-ray 11/05/2021 FINDINGS: No consolidation, pneumothorax or effusion. No edema. Normal cardiopericardial silhouette. Degenerative changes of the spine. Advanced degenerative changes of the shoulders, left-greater-than-right. IMPRESSION: No acute cardiopulmonary disease. Electronically Signed   By: Karen Kays M.D.   On: 10/19/2023 13:22     Assessment & Plan: Tiffany [name] is a 89 y.o.  female with past medical history including chronic heart failure, A-fib with recent cardioversion on Xarelto, CAD, and chronic kidney disease stage IV-5, who was admitted to [facility] on 10/19/2023 for CHF (congestive heart failure) (HCC) [I50.9]   Chronic kidney disease stage V/fluid overload.  Baseline creatinine appears to be 3 with GFR of 14 on 09/22/2023.  Patient receiving treatment for diagnosis of pain and discomfort.  Will order furosemide drip at 5 mg/h.  Patient does understand that this may cause renal function to decline.  She is agreeable to proceed.  Patient also states she does not want renal replacement therapy if needed.  This is a reasonable request considering patient's age and health concerns.  IV albumin also ordered to facilitate fluid mobilization.  2.  Acute on chronic combined heart failure.  Last echo on 11/06/2021 shows EF 30 to 35% with a grade 2 diastolic dysfunction, moderate mitral valve regurgitation.  Updated echo ordered.  3. Diabetes mellitus type II with chronic kidney disease/renal manifestations: insulin dependent. Home regimen includes Lantus, NovoLog and glimepiride. Most recent hemoglobin A1c is 7.4 on 08/27/22.    LOS: 0 Toni Hoffmeister 10/22/20243:27 PM

## 2023-10-19 NOTE — Progress Notes (Signed)
As per recommendations from wound care team, will order ABI study.

## 2023-10-19 NOTE — Consult Note (Signed)
WOC Nurse Consult Note: Reason for Consult: LE weeping Wound type: no wounds seen in images, no weeping noted in images  Pressure Injury POA: NA Measurement:NA Wound bed:NA Drainage (amount, consistency, odor) none Periwound: chronic skin changes of the bilateral LEs associated with venous dermatitis  Dressing procedure/placement/frequency: Xeroform for any active sites of weeping,  Wrap from toes to knees with kerlix and ACE wraps. Change daily  Consider ABIs for safety for therapeutic compression or work up as outpatient for long term management of LE edema and prevention of cellulitis/open wound reoccur ance.   Discussed POC with patient and bedside nurse.  Re consult if needed, will not follow at this time. Thanks  [employer], RN,CWOCN, CNS, CWON-AP [phone])

## 2023-10-19 NOTE — H&P (Addendum)
History and Physical    Tiffany [name] AOZ:[id number] DOB: 04-04-1933 DOA: 10/19/2023  PCP: Lynnea Ferrier, MD (Confirm with patient/family/NH records and if not entered, this has to be entered at [facility] point of entry) Patient coming from: Home  I have personally briefly reviewed patient's old medical records in [facility]  Chief Complaint: Leg swelling and pain  HPI: Tiffany [name] is a 89 y.o. female with medical history significant of chronic HFrEF with LVEF 30-35%, CAD, CKD stage IV, PAF status post recent cardioversion on Xarelto, recent obstructive labs at ureteral stone status post stenting April 20204,chronic back pain, IDDM with insulin resistance, obesity, presented with worsening of right leg rash and pain.  Symptoms started 3 days ago, patient does have a chronic breath of lower extremity edema and lymphedema, she takes torsemide on as needed basis.  3 weeks ago she started to develop bilateral lower part of both legs rash and pain, was diagnosed with cellulitis and treated for 1 week with Keflex with some help.  This week, her symptoms has come back with worsening rash and pain, right leg> left leg.  Denies any fever or chills.  She also has some chronic issues with her CHF as well as CKD stage IV.  Her baseline creatinine level is 1.6-1.8.  In April she had AKI when she was found to have a obstructive left ureteral stone and 1 stent was placed in.  2 weeks ago her creatinine level bumped to 3.1.  Husband reported the patient has a history of gout but due to her kidney function, medication was not started.  Last week patient started to have worsening of chronic back pain, and she has been treated with prednisone tapering dose.  Patient underwent cardioversion earlier this year, this time she denied any palpitations.  She has been taking torsemide 20 mg every day but does not see a significant improvement of her leg swelling. ED Course: Afebrile, nontachycardic blood  pressure 109/61 O2 saturation 100% on room air.  WBC 15.3, hemoglobin 12.8, K4.2, creatinine 3.1 compared to 1.82 months ago.  Glucose 263.  Patient was given ceftriaxone x 1 in the ED  Review of Systems: As per HPI otherwise 14 point review of systems negative.    Past Medical History:  Diagnosis Date   Anemia    Aortic atherosclerosis (HCC)    Arthritis    CAD (coronary artery disease) 02/27/2015   a.) MPI 02/27/2015: EF 62%, small/mild area of septal hypoperfusion with borderline partial redistribution; equivocal study; b.) LHC 03/13/2015: LVEDP 16 mmHg; 10% mLAD, 10% RCA --> no intervention required (med mgmt); c.) MPI 03/16/2019: EF 56%; normal study.   Cataracts, both eyes    Cervical disc disease    a.) s/p cervical fusion   Chickenpox    CKD (chronic kidney disease), stage IV (HCC)    Complication of anesthesia    a.) remote h/o PONV; b.) difficult intubation related to remote cervical fusion   Difficult intubation    a.) secondary to remote cervical fusion; reports severe pain s/p being intubated   Dyspnea on exertion    HFrEF (heart failure with reduced ejection fraction) (HCC)    a.) TTE 01/11/2014: EF >55%, mod LVH, MAC, mild RAE, mild MR/TR, sev PR; b.) TTE 11/14/2018: EF 40%, sep and inf HK, mild LAE, triv P, mod MR/TR, G2DD; c.) TTE 11/15/2020: EF 45%, sep HK, mild LVH, triv AR/PR, mild MR/TR, d.) TTE 10/28/2021: EF 45%, mild glob HK, mild LVH,  triv AR/PR, mild MR, mod TR; e.) TTE 11/06/2021: EF 30-35%, mid apical and anteroseptal HK, mild TR, mod MR, G2DD.   History of recurrent UTI (urinary tract infection)    Hyperlipidemia    Hypertension    Incontinence in female    wears pads   LBBB (left bundle branch block)    Long term (current) use of anticoagulants    a.) rivaroxaban   Long-term current use of opiate analgesic    a.) hydrocodone/APAP (Norco)   Lumbar disc disease    a.) s/p lumbar interbody fusion L2-L5   PAF (paroxysmal atrial fibrillation) (HCC)     a.) CHA2DS2VASc = 7 (age x2, sex, HFrEF, HTN, vascular diasease, T2DM); b.) s/p DCCV 01/12/2017 (150 J x 1) and 11/04/2021 (120 J x 1); c.) rate/rhythm maintained on oral amiodarone + metoprolol succinate; chronically anticoagulated with rivaroxaban   PONV (postoperative nausea and vomiting)    with 1st pregnancy 50 years ago and no problem since then   Right leg weakness    Sciatica    Spinal stenosis of lumbar region    Type 2 diabetes mellitus treated with insulin (HCC)    Ventral hernia     Past Surgical History:  Procedure Laterality Date   CARDIOVERSION N/A 11/04/2021   Procedure: CARDIOVERSION;  Surgeon: Dalia Heading, MD;  Location: [facility];  Service: Cardiovascular;  Laterality: N/A;   CARDIOVERSION N/A 03/17/2023   Procedure: CARDIOVERSION;  Surgeon: Marcina Millard, MD;  Location: [facility];  Service: Cardiovascular;  Laterality: N/A;   CATARACT EXTRACTION, BILATERAL     CERVICAL LAMINECTOMY  1996   CYSTOSCOPY W/ RETROGRADES  12/06/2020   Procedure: CYSTOSCOPY WITH RETROGRADE PYELOGRAM;  Surgeon: Sondra Come, MD;  Location: [facility];  Service: Urology;;   CYSTOSCOPY W/ URETERAL STENT PLACEMENT Left 04/10/2022   Procedure: CYSTOSCOPY WITH STENT REPLACEMENT;  Surgeon: Sondra Come, MD;  Location: [facility];  Service: Urology;  Laterality: Left;   CYSTOSCOPY W/ URETERAL STENT PLACEMENT Left 03/12/2023   Procedure: CYSTOSCOPY WITH STENT EXCHANGE;  Surgeon: Sondra Come, MD;  Location: [facility];  Service: Urology;  Laterality: Left;   CYSTOSCOPY WITH STENT PLACEMENT Left 12/06/2020   Procedure: CYSTOSCOPY WITH STENT PLACEMENT;  Surgeon: Sondra Come, MD;  Location: [facility];  Service: Urology;  Laterality: Left;   ELECTROPHYSIOLOGIC STUDY N/A 01/12/2017   Procedure: Cardioversion;  Surgeon: Dalia Heading, MD;  Location: [facility];  Service: Cardiovascular;  Laterality: N/A;   INSERTION OF MESH N/A 08/27/2022   Procedure: INSERTION OF MESH;  Surgeon: Sung Amabile, DO;  Location: [facility];  Service: General;  Laterality: N/A;   JOINT REPLACEMENT     LATERAL FUSION LUMBAR SPINE, TRANSVERSE  01/2010   L2-L5   LEFT HEART CATH AND CORONARY ANGIOGRAPHY Left 03/13/2015   Procedure: LEFT HEART CATH AND CORONARY ANGIOGRAPHY; Location: [facility]; Surgeon: Harold Hedge, MD   ROTATOR CUFF REPAIR Right 2010   TONSILLECTOMY     TOTAL KNEE ARTHROPLASTY Left 05/23/2019   Procedure: TOTAL KNEE ARTHROPLASTY - LEFT - DIABETIC;  Surgeon: Christena Flake, MD;  Location: [facility];  Service: Orthopedics;  Laterality: Left;   URETEROSCOPY N/A 12/06/2020   Procedure: DIAGNOSTIC URETEROSCOPY;  Surgeon: Sondra Come, MD;  Location: [facility];  Service: Urology;  Laterality: N/A;   XI ROBOTIC ASSISTED VENTRAL HERNIA N/A 08/27/2022   Procedure: XI ROBOTIC ASSISTED VENTRAL HERNIA;  Surgeon: Sung Amabile, DO;  Location: [facility];  Service: General;  Laterality: N/A;  reports that she has never smoked. She has never been exposed to tobacco smoke. She has never used smokeless tobacco. She reports current alcohol use of about 1.0 standard drink of alcohol per week. She reports that she does not use drugs.  Allergies  Allergen Reactions   Amlodipine Swelling   Codeine Nausea And Vomiting    Tolerates low doses hydrocodone   Tizanidine     Didn't feel good taking it   Doxycycline Nausea And Vomiting    Family History  Adopted: Yes     Prior to Admission medications   Medication Sig Start Date End Date Taking? Authorizing Provider  acetaminophen (TYLENOL) 500 MG tablet Take 500 mg by mouth every 6 (six) hours as needed (pain).     [provider]  amiodarone (PACERONE) 200 MG tablet Take 200 mg by mouth daily.    [provider]  ciprofloxacin (CIPRO) 250 MG tablet Take 1 tablet (250 mg total) by mouth 2 (two) times daily. Patient not taking: Reported on 03/17/2023 03/08/23   Sondra Come, MD  glimepiride (AMARYL) 2 MG tablet Take 2 mg by mouth  daily with breakfast.    [provider]  glucose blood (PRECISION QID TEST) test strip Use 2 (two) times daily Use as instructed. 05/03/20   [provider]  HYDROcodone-acetaminophen (NORCO) 5-325 MG tablet Take 1 tablet by mouth every 6 (six) hours as needed for up to 6 doses for moderate pain. 08/28/22   Sung Amabile, DO  HYDROcodone-acetaminophen (NORCO/VICODIN) 5-325 MG tablet Take 0.5 tablets by mouth 2 (two) times daily as needed for moderate pain.    [provider]  hydrocortisone cream 1 % Apply 1 application  topically 2 (two) times daily as needed (rash).    [provider]  insulin glargine, 1 Unit Dial, (TOUJEO SOLOSTAR) 300 UNIT/ML Solostar Pen Inject 10-14 Units into the skin daily. 10/24/20   [provider]  Insulin Pen Needle (BD PEN NEEDLE NANO U/F) 32G X 4 MM MISC USE WITH INSULIN PEN TWICE DAILY OR AS DIRECTED 10/18/19   [provider]  metoprolol succinate (TOPROL-XL) 25 MG 24 hr tablet Take 25 mg by mouth daily.    [provider]  NOVOLOG FLEXPEN 100 UNIT/ML FlexPen Inject 3-6 Units into the skin daily before supper. 08/30/20   [provider]  polyethylene glycol (MIRALAX / GLYCOLAX) 17 g packet Take 8.5-17 g by mouth daily as needed for moderate constipation.    [provider]  predniSONE (DELTASONE) 5 MG tablet Take 5-30 mg by mouth See admin instructions. Pt takes 5-30 mg daily as needed for pain. When a flare occurs take 30 mg the first day then reduce by 5 mg daily until finished (similar to a prednisone dose pack) 02/28/22   [provider]  rivaroxaban (XARELTO) 15 MG TABS tablet Take 1 tablet (15 mg total) by mouth daily with breakfast. 11/07/21   Leeroy Bock, MD  torsemide (DEMADEX) 20 MG tablet Take 0.5 tablets (10 mg total) by mouth daily. Patient taking differently: Take 10 mg by mouth as needed. 11/07/21   Leeroy Bock, MD    Physical Exam: Vitals:   10/19/23  1610 10/19/23 0822  BP:  109/61  Pulse: 85   Resp: 18   Temp: 97.8 F (36.6 C)   SpO2: 100%   Weight:  89.8 kg  Height:  5\' 4"  (1.626 m)    Constitutional: NAD, calm, comfortable Vitals:   10/19/23 9604 10/19/23 5409  BP:  109/61  Pulse: 85   Resp: 18   Temp: 97.8 F (36.6 C)   SpO2: 100%   Weight:  89.8 kg  Height:  5\' 4"  (1.626 m)   Eyes: PERRL, lids and conjunctivae normal ENMT: Mucous membranes are moist. Posterior pharynx clear of any exudate or lesions.Normal dentition.  Neck: normal, supple, no masses, no thyromegaly Respiratory: clear to auscultation bilaterally, no wheezing, fine crackles on bilateral lower fields, increasing respiratory effort. No accessory muscle use.  Cardiovascular: Regular rate and rhythm, no murmurs / rubs / gallops.  Anasarca. 2+ pedal pulses. No carotid bruits.  Abdomen: no tenderness, no masses palpated. No hepatosplenomegaly. Bowel sounds positive.  Musculoskeletal: no clubbing / cyanosis. No joint deformity upper and lower extremities. Good ROM, no contractures. Normal muscle tone.  Skin: Significant anasarca on bilateral lower extremity, with weeping clear fluid, rash of bilateral lower extremity right more than left, warm and tenderness to touch Neurologic: CN 2-12 grossly intact. Sensation intact, DTR normal. Strength 5/5 in all 4.  Psychiatric: Normal judgment and insight. Alert and oriented x 3. Normal mood.        Labs on Admission: I have personally reviewed following labs and imaging studies  CBC: Recent Labs  Lab 10/19/23 0824  WBC 15.3*  HGB 12.8  HCT 40.8  MCV 85.9  PLT 253   Basic Metabolic Panel: Recent Labs  Lab 10/19/23 0824  NA 135  K 4.2  CL 99  CO2 23  GLUCOSE 263*  BUN 82*  CREATININE 3.11*  CALCIUM 8.9   GFR: Estimated Creatinine Clearance: 13.6 mL/min (A) (by C-G formula based on SCr of 3.11 mg/dL (H)). Liver Function Tests: No results for input(s): "AST", "ALT", "ALKPHOS", "BILITOT", "PROT",  "ALBUMIN" in the last 168 hours. No results for input(s): "LIPASE", "AMYLASE" in the last 168 hours. No results for input(s): "AMMONIA" in the last 168 hours. Coagulation Profile: No results for input(s): "INR", "PROTIME" in the last 168 hours. Cardiac Enzymes: No results for input(s): "CKTOTAL", "CKMB", "CKMBINDEX", "TROPONINI" in the last 168 hours. BNP (last 3 results) No results for input(s): "PROBNP" in the last 8760 hours. HbA1C: No results for input(s): "HGBA1C" in the last 72 hours. CBG: No results for input(s): "GLUCAP" in the last 168 hours. Lipid Profile: No results for input(s): "CHOL", "HDL", "LDLCALC", "TRIG", "CHOLHDL", "LDLDIRECT" in the last 72 hours. Thyroid Function Tests: No results for input(s): "TSH", "T4TOTAL", "FREET4", "T3FREE", "THYROIDAB" in the last 72 hours. Anemia Panel: No results for input(s): "VITAMINB12", "FOLATE", "FERRITIN", "TIBC", "IRON", "RETICCTPCT" in the last 72 hours. Urine analysis:    Component Value Date/Time   COLORURINE YELLOW (A) 05/25/2019 1126   APPEARANCEUR Turbid (A) 03/30/2022 1004   LABSPEC 1.015 05/25/2019 1126   LABSPEC 1.008 04/20/2012 0900   PHURINE 5.0 05/25/2019 1126   GLUCOSEU Negative 03/30/2022 1004   GLUCOSEU Negative 04/20/2012 0900   HGBUR SMALL (A) 05/25/2019 1126   BILIRUBINUR Negative 03/30/2022 1004   BILIRUBINUR Negative 04/20/2012 0900   KETONESUR 5 (A) 05/25/2019 1126   PROTEINUR 1+ (A) 03/30/2022 1004   PROTEINUR NEGATIVE 05/25/2019 1126   NITRITE Negative 03/30/2022 1004   NITRITE NEGATIVE 05/25/2019 1126   LEUKOCYTESUR 1+ (A) 03/30/2022 1004   LEUKOCYTESUR SMALL (A) 05/25/2019 1126   LEUKOCYTESUR Negative 04/20/2012 0900    Radiological Exams on Admission: No results found.  EKG: Ordered  Assessment/Plan Principal Problem:   CHF (congestive heart failure) (HCC) Active Problems:   Acute on chronic congestive heart failure (HCC)  Cellulitis   AKI (acute kidney injury) (HCC)  (please  populate well all problems here in Problem List. (For example, if patient is on BP meds at home and you resume or decide to hold them, it is a problem that needs to be her. Same for CAD, COPD, HLD and so on)  Acute on chronic HFrEF decompensation -Anasarca -Strongly suspect cardiorenal syndrome, given the significant status of fluid overload. -Decided to start Lasix 40 mg IV twice daily, monitor kidney function.  Nephrology consulted.  Benefit and risk discussed with husband at bedside. -Echocardiogram  AKI on CKD -Suspect cardiorenal syndrome as patient is significantly fluid overloaded, management as above -Check uric acid, renal ultrasound.  Plan to restart allopurinol -Renal ultrasound to rule out post renal obstructions.  Recurrent right leg cellulitis -Check MRSA and strep a PCR -Continue ceftriaxone -Currently there is no signs of MRSA infection  Elevated lactic acid -With fluid overload, suspect cardiorenal syndrome and intravascular depletion.  PAF -Status post cardioversion in April of this year.  Patient denied any palpitations.  At this point unclear what causes her CHF decompensation.  Will keep her on telemonitoring x 24 hours and recommend follow-up with cardiology for outpatient monitoring to rule out breakthrough PAF. -Renal dosed Xarelto  IDDM with hyperglycemia, with insulin resistance -Start Lantus 15 units daily -SSI -Add Januvia  Recent left-sided hydronephrosis secondary to obstructing ureteral stone -No urinary symptoms -Check renal ultrasound  Chronic LBBB -With low LVEF, consider CRT. Outpatient cardiology follow up.  DVT prophylaxis: Xarelto Code Status: Full code Family Communication: Husband at bedside Disposition Plan: Patient is sick with significant CHF decompensation and worsening of kidney function requiring IV diuresis and close monitoring kidney function, in addition she also has recurrent cellulitis requiring IV antibiotics, expect more  than 2 midnight hospital stay  consults called: Nephrology Admission status: Telemetry admission   Emeline General MD [facility] Pager [phone]  10/19/2023, 9:53 AM

## 2023-10-19 NOTE — ED Notes (Signed)
Pt legs wrapped in gauze and ACE bandage. Xeroform applied to areas weeping. Pt legs sensitive to touch but was able to tolerate dressing

## 2023-10-19 NOTE — ED Provider Notes (Addendum)
[facility] Provider Note    Event Date/Time   First MD Initiated Contact with Patient 10/19/23 [phone]     (approximate)   History   Cellulitis   HPI  Tiffany [name] is a 89 y.o. female with hypertension who comes in w [facility] due to concern for bilateral leg cellulitis.  I reviewed a note from 10/2 where patient has paroxysmal A-fib, hypertension, hyperlipidemia, CKD, lymphedema.  Patient's husband reported that she was recently on a course of Keflex due to concern for her redness in her legs however today she almost had a near fall where he was able to lower her down to the ground.  Did not hit her head.  She is on xarelto for her A-fib.  He denies any injuries from the falls.  He however is concerned about her redness getting worse and he supposed to follow-up outpatient with vascular for the lymphedema.  Physical Exam   Triage Vital Signs: ED Triage Vitals  Encounter Vitals Group     BP 10/19/23 0822 109/61     Systolic BP Percentile --      Diastolic BP Percentile --      Pulse Rate 10/19/23 0821 85     Resp 10/19/23 0821 18     Temp 10/19/23 0821 97.8 F (36.6 C)     Temp src --      SpO2 10/19/23 0821 100 %     Weight 10/19/23 0822 198 lb (89.8 kg)     Height 10/19/23 0822 5\' 4"  (1.626 m)     Head Circumference --      Peak Flow --      Pain Score 10/19/23 0822 6     Pain Loc --      Pain Education --      Exclude from Growth Chart --     Most recent vital signs: Vitals:   10/19/23 0821 10/19/23 0822  BP:  109/61  Pulse: 85   Resp: 18   Temp: 97.8 F (36.6 C)   SpO2: 100%      General: Awake, no distress.  CV:  Good peripheral perfusion.  Resp:  Normal effort.  Abd:  No distention.  Other:  Patient has good distal pulses lymphedema noted to bilateral legs with redness worse on the right than the left streaking up the leg into the thigh.   ED Results / Procedures / Treatments   Labs (all  labs ordered are listed, but only abnormal results are displayed) Labs Reviewed  CBC  BASIC METABOLIC PANEL  LACTIC ACID, PLASMA  LACTIC ACID, PLASMA     RADIOLOGY  PROCEDURES:  Critical Care performed: No  Procedures   MEDICATIONS ORDERED IN ED: Medications  cefTRIAXone (ROCEPHIN) 2 g in sodium chloride 0.9 % 100 mL IVPB (has no administration in time range)     IMPRESSION / MDM / ASSESSMENT AND PLAN / ED COURSE  I reviewed the triage vital signs and the nursing notes.   Patient's presentation is most consistent with acute presentation with potential threat to life or bodily function.   Differential is cellulitis, no evidence of any abscess on examination good distal pulses.  Considered DVT but she is already on Xarelto so doubt that.  White count is elevated at 15 but otherwise does not meet sepsis criteria however will get blood cultures, lactate before starting patient on ceftriaxone.  Blood work shows creatinine of 3.11 which upon review of her [facility]  records is similar but BUN slightly elevated.  She did have this further worked up inpatient and will admit to the hospitalist.  The patient is on the cardiac monitor to evaluate for evidence of arrhythmia and/or significant heart rate changes.      FINAL CLINICAL IMPRESSION(S) / ED DIAGNOSES   Final diagnoses:  Cellulitis, unspecified cellulitis site     Rx / DC Orders   ED Discharge Orders     None        Note:  This document was prepared using Dragon voice recognition software and may include unintentional dictation errors.   Concha Se, MD 10/19/23 7829    Concha Se, MD 10/19/23 [phone]

## 2023-10-19 NOTE — ED Notes (Signed)
See triage note.

## 2023-10-20 DIAGNOSIS — L0390 Cellulitis, unspecified: Secondary | ICD-10-CM

## 2023-10-20 DIAGNOSIS — M79604 Pain in right leg: Secondary | ICD-10-CM

## 2023-10-20 DIAGNOSIS — M79605 Pain in left leg: Secondary | ICD-10-CM

## 2023-10-20 DIAGNOSIS — N179 Acute kidney failure, unspecified: Secondary | ICD-10-CM

## 2023-10-20 DIAGNOSIS — E0800 Diabetes mellitus due to underlying condition with hyperosmolarity without nonketotic hyperglycemic-hyperosmolar coma (NKHHC): Secondary | ICD-10-CM

## 2023-10-20 LAB — BLOOD CULTURE ID PANEL (REFLEXED) - BCID2

## 2023-10-20 LAB — BASIC METABOLIC PANEL
Anion gap: 10 (ref 5–15)
BUN: 84 mg/dL — ABNORMAL HIGH (ref 8–23)
CO2: 24 mmol/L (ref 22–32)
Calcium: 8.4 mg/dL — ABNORMAL LOW (ref 8.9–10.3)
Chloride: 100 mmol/L (ref 98–111)
Creatinine, Ser: 3.22 mg/dL — ABNORMAL HIGH (ref 0.44–1.00)
GFR, Estimated: 13 mL/min — ABNORMAL LOW (ref >60)
Glucose, Bld: 67 mg/dL — ABNORMAL LOW (ref 70–99)
Potassium: 4.3 mmol/L (ref 3.5–5.1)
Sodium: 134 mmol/L — ABNORMAL LOW (ref 135–145)

## 2023-10-20 LAB — HEMOGLOBIN A1C
Hgb A1c MFr Bld: 8.9 % — ABNORMAL HIGH (ref 4.8–5.6)
Mean Plasma Glucose: 209 mg/dL

## 2023-10-20 LAB — GLUCOSE, CAPILLARY
Glucose-Capillary: 196 mg/dL — ABNORMAL HIGH (ref 70–99)
Glucose-Capillary: 20 mg/dL — CL (ref 70–99)
Glucose-Capillary: 217 mg/dL — ABNORMAL HIGH (ref 70–99)
Glucose-Capillary: 136 mg/dL — ABNORMAL HIGH (ref 70–99)
Glucose-Capillary: 123 mg/dL — ABNORMAL HIGH (ref 70–99)
Glucose-Capillary: 104 mg/dL — ABNORMAL HIGH (ref 70–99)

## 2023-10-20 MED ORDER — INSULIN GLARGINE-YFGN 100 UNIT/ML ~~LOC~~ SOLN
12.0000 [IU] | Freq: Every day | SUBCUTANEOUS | Status: DC
  Filled 2023-10-20: qty 0.12

## 2023-10-20 MED ORDER — DEXTROSE 50 % IV SOLN: 1.0000 | Freq: Once | INTRAVENOUS | Status: AC

## 2023-10-20 MED ORDER — DEXTROSE 50 % IV SOLN
50.0000 mL | Freq: Once | INTRAVENOUS | Status: AC
  Administered 2023-10-20: 50 mL via INTRAVENOUS

## 2023-10-20 MED ORDER — ACETAMINOPHEN 500 MG PO TABS
500.0000 mg | ORAL_TABLET | Freq: Four times a day (QID) | ORAL | Status: DC | PRN
  Administered 2023-10-20 – 2023-10-22 (×4): 500 mg via ORAL
  Filled 2023-10-20 (×3): qty 1

## 2023-10-20 MED ORDER — INSULIN ASPART 100 UNIT/ML IJ SOLN
0.0000 [IU] | Freq: Three times a day (TID) | INTRAMUSCULAR | Status: DC
  Administered 2023-10-21 – 2023-10-23 (×2): 3 [IU] via SUBCUTANEOUS
  Administered 2023-10-24: 15 [IU] via SUBCUTANEOUS
  Administered 2023-10-24 – 2023-10-25 (×2): 4 [IU] via SUBCUTANEOUS
  Administered 2023-10-25: 7 [IU] via SUBCUTANEOUS
  Administered 2023-10-26 (×2): 15 [IU] via SUBCUTANEOUS
  Administered 2023-10-26: 11 [IU] via SUBCUTANEOUS
  Administered 2023-10-27: 7 [IU] via SUBCUTANEOUS
  Administered 2023-10-27: 20 [IU] via SUBCUTANEOUS
  Administered 2023-10-28: 11 [IU] via SUBCUTANEOUS
  Administered 2023-10-28: 7 [IU] via SUBCUTANEOUS
  Filled 2023-10-20 (×11): qty 1

## 2023-10-20 MED ORDER — DEXTROSE 50 % IV SOLN
INTRAVENOUS | Status: AC
  Filled 2023-10-20: qty 50

## 2023-10-20 NOTE — Discharge Instructions (Addendum)

## 2023-10-20 NOTE — Progress Notes (Signed)
Transition of Care [facility]) - Inpatient Brief Assessment   Patient Details  Name: Tiffany [name] MRN: [id number] Date of Birth: 02-26-1933  [facility]) CM/SW Contact:    Truddie Hidden, RN Phone Number: 10/20/2023, 3:02 PM   Clinical Narrative: TOC continuing to follow patient's progress throughout discharge planning.   Transition of Care Asessment: Insurance and Status: Insurance coverage has been reviewed Patient has primary care physician: Yes Home environment has been reviewed: home Prior level of function:: independent Prior/Current Home Services: No current home services Social Determinants of Health Reivew: SDOH reviewed no interventions necessary Readmission risk has been reviewed: Yes Transition of care needs: no transition of care needs at this time

## 2023-10-20 NOTE — Evaluation (Signed)
Physical Therapy Evaluation Patient Details Name: Tiffany [name] MRN: [id number] DOB: 1933/11/09 Today's Date: 10/20/2023  History of Present Illness  Patient is a 89 y.o. female with medical history significant of chronic HFrEF with LVEF 30-35%, CAD, CKD stage IV, PAF status post recent cardioversion on Xarelto, recent obstructive labs at ureteral stone status post stenting,chronic back pain, IDDM with insulin resistance, obesity, presented with worsening of right leg rash and pain. Current MD assessment: Acute on chronic CHF, AKI, and Recurrent R leg cellulitis.  Clinical Impression  Pt was pleasant and motivated to participate following cues and encouragement. Pt  put forth good effort throughout. She is currently Total assist +2 for bed mobility in order to minimize RLE pain. Once sitting on EOB pt able to perform STS from elevated surface with RW with ease, needing no physical assist. Pt able to tolerate standing for ~ 1 min and was able to perform a few shuffling steps towards HOB before sitting down. Pt overall apprehensive during session, reports all mobility today was "not as bad" as she thought it would be. Pt will benefit from continued PT services upon discharge to safely address deficits listed in patient problem list for decreased caregiver assistance and eventual return to PLOF.          If plan is discharge home, recommend the following: Assist for transportation;Help with stairs or ramp for entrance;Assistance with cooking/housework;Two people to help with walking and/or transfers;Two people to help with bathing/dressing/bathroom   Can travel by private vehicle        Equipment Recommendations Other (comment) (TBD)  Recommendations for Other Services       Functional Status Assessment Patient has had a recent decline in their functional status and demonstrates the ability to make significant improvements in function in a reasonable and predictable amount of time.      Precautions / Restrictions Precautions Precautions: Fall Restrictions Weight Bearing Restrictions: No      Mobility  Bed Mobility Overal bed mobility: Needs Assistance Bed Mobility: Supine to Sit, Sit to Supine     Supine to sit: +2 for physical assistance, Total assist Sit to supine: +2 for physical assistance, Total assist   General bed mobility comments: Needs total assist due to management of RLE    Transfers Overall transfer level: Needs assistance Equipment used: Rolling walker (2 wheels) Transfers: Sit to/from Stand Sit to Stand: From elevated surface, Contact guard assist           General transfer comment: Pt hesitant at first, but stood easily from elevated surface    Ambulation/Gait Ambulation/Gait assistance: Contact guard assist Gait Distance (Feet): 2 Feet Assistive device: Rolling walker (2 wheels) Gait Pattern/deviations: Shuffle Gait velocity: decreased     General Gait Details: Pt able to take a few shuffling steps towards [facility]  Stairs            Wheelchair Mobility     Tilt Bed    Modified Rankin (Stroke Patients Only)       Balance Overall balance assessment: Needs assistance Sitting-balance support: Feet supported Sitting balance-Leahy Scale: Fair Sitting balance - Comments: intermitted UE support   Standing balance support: During functional activity, Bilateral upper extremity supported Standing balance-Leahy Scale: Fair Standing balance comment: light hold on RW, static standing                             Pertinent Vitals/Pain Pain Assessment Pain Assessment: Faces Faces Pain  Scale: Hurts little more Pain Location: R leg Pain Descriptors / Indicators: Sore, Tender Pain Intervention(s): Limited activity within patient's tolerance, Monitored during session    Home Living Family/patient expects to be discharged to:: Private residence Living Arrangements: Spouse/significant other Available Help at  Discharge: Family;Available 24 hours/day Type of Home: Independent living facility Home Access: Stairs to enter Entrance Stairs-Rails: None Entrance Stairs-Number of Steps: 1   Home Layout: One level Home Equipment: Agricultural consultant (2 wheels);Grab bars - toilet;Grab bars - tub/shower;BSC/3in1;Wheelchair - manual Additional Comments: Pt and husbanc are current residents at [facility]; Independent side    Prior Function Prior Level of Function : Needs assist             Mobility Comments: Pt uses RW, she is a household ambulator ADLs Comments: Pt reports husband assists with ADL's     Extremity/Trunk Assessment   Upper Extremity Assessment Upper Extremity Assessment: Overall WFL for tasks assessed    Lower Extremity Assessment Lower Extremity Assessment: Generalized weakness;RLE deficits/detail;LLE deficits/detail RLE Deficits / Details: Diffuse swelling present; cellulitis LLE Deficits / Details: Diffuse swelling present       Communication   Communication Communication: No apparent difficulties  Cognition Arousal: Alert Behavior During Therapy: WFL for tasks assessed/performed, Anxious Overall Cognitive Status: Within Functional Limits for tasks assessed                                 General Comments: Pt slightly anxious about movement of RLE        General Comments      Exercises Total Joint Exercises Ankle Circles/Pumps: AROM, 10 reps, Both Hip ABduction/ADduction: AAROM, Right, 5 reps Other Exercises Other Exercises: Consistent cues, encouragement, and educaiton on benefits of frequent mobility in order to maintain funcitonal strength   Assessment/Plan    PT Assessment Patient needs continued PT services  PT Problem List Decreased strength;Decreased coordination;Decreased range of motion;Decreased activity tolerance;Decreased balance;Decreased mobility       PT Treatment Interventions DME instruction;Balance training;Gait training;Stair  training;Functional mobility training;Therapeutic activities;Therapeutic exercise;Patient/family education    PT Goals (Current goals can be found in the Care Plan section)  Acute Rehab PT Goals Patient Stated Goal: decrease back pain PT Goal Formulation: With patient Time For Goal Achievement: 11/02/23 Potential to Achieve Goals: Fair    Frequency Min 1X/week     Co-evaluation               AM-PAC PT "6 Clicks" Mobility  Outcome Measure Help needed turning from your back to your side while in a flat bed without using bedrails?: Total Help needed moving from lying on your back to sitting on the side of a flat bed without using bedrails?: Total Help needed moving to and from a bed to a chair (including a wheelchair)?: A Lot Help needed standing up from a chair using your arms (e.g., wheelchair or bedside chair)?: A Little Help needed to walk in hospital room?: A Little Help needed climbing 3-5 steps with a railing? : A Lot 6 Click Score: 12    End of Session Equipment Utilized During Treatment: Gait belt Activity Tolerance: Patient tolerated treatment well;Patient limited by pain Patient left: in bed;with bed alarm set;with call bell/phone within reach Nurse Communication: Mobility status PT Visit Diagnosis: Muscle weakness (generalized) (M62.81);Pain Pain - Right/Left:  (bilateral) Pain - part of body: Leg    Time: 1610-9604 PT Time Calculation (min) (ACUTE ONLY): 44 min  Charges:                 Cecile Sheerer, SPT 10/20/23, 4:32 PM

## 2023-10-20 NOTE — Progress Notes (Signed)
PT Cancellation Note  Patient Details Name: Tiffany [name] MRN: [id number] DOB: [birth year]   Cancelled Treatment:    Reason Eval/Treat Not Completed: Patient's spouse declined for pt to receive PT evaluation secondary to patient lethargy.  Will attempt to see pt at a future date/time as medically appropriate.    Ovidio Hanger PT, DPT 10/20/23, 10:35 AM

## 2023-10-20 NOTE — Progress Notes (Signed)
[facility]  ROUNDING NOTE   Subjective:   Patient seen resting in bed, husband at bedside Alert and oriented Lower extremity edema improved  Furosemide drip remains in place at 5 mg/h Urine output recorded at 1.3 L overnight In-N-Out cath performed overnight due to retention.  Creatinine 3.22  Objective:  Vital signs in last 24 hours:  Temp:  [97.5 F (36.4 C)-101.3 F (38.5 C)] 99.3 F (37.4 C) (10/23 1214) Pulse Rate:  [72-91] 78 (10/23 1214) Resp:  [15-20] 16 (10/23 1214) BP: (95-142)/(46-66) 115/53 (10/23 1214) SpO2:  [95 %-100 %] 100 % (10/23 1214)  Weight change:  Filed Weights   10/19/23 0822  Weight: 89.8 kg    Intake/Output: I/O last 3 completed shifts: In: 240 [P.O.:240] Out: 1300 [Urine:1300]   Intake/Output this shift:  No intake/output data recorded.  Physical Exam: General: NAD  Head: Normocephalic, atraumatic. Moist oral mucosal membranes  Eyes: Anicteric  Lungs:  Clear to auscultation  Heart: Regular rate and rhythm  Abdomen:  Soft, nontender,   Extremities: 2+ peripheral edema.  Neurologic: Nonfocal, moving all four extremities  Skin: BLE erythema       Basic Metabolic Panel: Recent Labs  Lab 10/19/23 0824 10/20/23 0334  NA 135 134*  K 4.2 4.3  CL 99 100  CO2 23 24  GLUCOSE 263* 67*  BUN 82* 84*  CREATININE 3.11* 3.22*  CALCIUM 8.9 8.4*    Liver Function Tests: No results for input(s): "AST", "ALT", "ALKPHOS", "BILITOT", "PROT", "ALBUMIN" in the last 168 hours. No results for input(s): "LIPASE", "AMYLASE" in the last 168 hours. No results for input(s): "AMMONIA" in the last 168 hours.  CBC: Recent Labs  Lab 10/19/23 0824  WBC 15.3*  HGB 12.8  HCT 40.8  MCV 85.9  PLT 253    Cardiac Enzymes: Recent Labs  Lab 10/19/23 0827  CKTOTAL 16*    BNP: Invalid input(s): "POCBNP"  CBG: Recent Labs  Lab 10/19/23 2213 10/20/23 0737 10/20/23 0745 10/20/23 0755 10/20/23 1217  GLUCAP 87 20* 217* 196*  136*    Microbiology: Results for orders placed or performed during the hospital encounter of 10/19/23  Blood culture (routine x 2)     Status: None (Preliminary result)   Collection Time: 10/19/23  9:10 AM   Specimen: BLOOD  Result Value Ref Range Status   Specimen Description BLOOD RIGHT WRIST  Final   Special Requests   Final    BOTTLES DRAWN AEROBIC AND ANAEROBIC Blood Culture adequate volume   Culture  Setup Time   Final    Organism ID to follow GRAM NEGATIVE RODS ANAEROBIC BOTTLE ONLY CRITICAL RESULT CALLED TO, READ BACK BY AND VERIFIED WITH: JASON ROBBINS PHARMD @0000  10/20/23 ASW Performed at [facility], [address] [place], [place] [address]    Culture GRAM NEGATIVE RODS  Final   Report Status PENDING  Incomplete  Blood Culture ID Panel (Reflexed)     Status: Abnormal   Collection Time: 10/19/23  9:10 AM  Result Value Ref Range Status   Enterococcus faecalis NOT DETECTED NOT DETECTED Final   Enterococcus Faecium NOT DETECTED NOT DETECTED Final   Listeria monocytogenes NOT DETECTED NOT DETECTED Final   Staphylococcus species NOT DETECTED NOT DETECTED Final   Staphylococcus aureus (BCID) NOT DETECTED NOT DETECTED Final   Staphylococcus epidermidis NOT DETECTED NOT DETECTED Final   Staphylococcus lugdunensis NOT DETECTED NOT DETECTED Final   Streptococcus species NOT DETECTED NOT DETECTED Final   Streptococcus agalactiae NOT DETECTED NOT  DETECTED Final   Streptococcus pneumoniae NOT DETECTED NOT DETECTED Final   Streptococcus pyogenes NOT DETECTED NOT DETECTED Final   A.calcoaceticus-baumannii NOT DETECTED NOT DETECTED Final   Bacteroides fragilis NOT DETECTED NOT DETECTED Final   Enterobacterales DETECTED (A) NOT DETECTED Final    Comment: Enterobacterales represent a large order of gram negative bacteria, not a single organism. CRITICAL RESULT CALLED TO, READ BACK BY AND VERIFIED WITH: JASON ROBBINS PHARMD @0000  10/20/23 ASW    Enterobacter cloacae  complex NOT DETECTED NOT DETECTED Final   Escherichia coli NOT DETECTED NOT DETECTED Final   Klebsiella aerogenes NOT DETECTED NOT DETECTED Final   Klebsiella oxytoca DETECTED (A) NOT DETECTED Final    Comment: CRITICAL RESULT CALLED TO, READ BACK BY AND VERIFIED WITH: JASON ROBBINS PHARMD @0000  10/20/23 ASW    Klebsiella pneumoniae NOT DETECTED NOT DETECTED Final   Proteus species NOT DETECTED NOT DETECTED Final   Salmonella species NOT DETECTED NOT DETECTED Final   Serratia marcescens NOT DETECTED NOT DETECTED Final   Haemophilus influenzae NOT DETECTED NOT DETECTED Final   Neisseria meningitidis NOT DETECTED NOT DETECTED Final   Pseudomonas aeruginosa NOT DETECTED NOT DETECTED Final   Stenotrophomonas maltophilia NOT DETECTED NOT DETECTED Final   Candida albicans NOT DETECTED NOT DETECTED Final   Candida auris NOT DETECTED NOT DETECTED Final   Candida glabrata NOT DETECTED NOT DETECTED Final   Candida krusei NOT DETECTED NOT DETECTED Final   Candida parapsilosis NOT DETECTED NOT DETECTED Final   Candida tropicalis NOT DETECTED NOT DETECTED Final   Cryptococcus neoformans/gattii NOT DETECTED NOT DETECTED Final   CTX-M ESBL NOT DETECTED NOT DETECTED Final   Carbapenem resistance IMP NOT DETECTED NOT DETECTED Final   Carbapenem resistance KPC NOT DETECTED NOT DETECTED Final   Carbapenem resistance NDM NOT DETECTED NOT DETECTED Final   Carbapenem resist OXA 48 LIKE NOT DETECTED NOT DETECTED Final   Carbapenem resistance VIM NOT DETECTED NOT DETECTED Final    Comment: Performed at [facility], [address] [place], [place] [address]  Blood culture (routine x 2)     Status: None (Preliminary result)   Collection Time: 10/19/23 11:45 AM   Specimen: BLOOD  Result Value Ref Range Status   Specimen Description BLOOD LEFT ANTECUBITAL  Final   Special Requests   Final    BOTTLES DRAWN AEROBIC AND ANAEROBIC Blood Culture adequate volume   Culture   Final    NO GROWTH < 24  HOURS Performed at [facility], [address] [place], [place] [address]    Report Status PENDING  Incomplete  Group A Strep by PCR     Status: None   Collection Time: 10/19/23  4:15 PM   Specimen: Throat; Sterile Swab  Result Value Ref Range Status   Group A Strep by PCR NOT DETECTED NOT DETECTED Final    Comment: Performed at [facility], [address] [place], [place] [address]    Coagulation Studies: No results for input(s): "LABPROT", "INR" in the last 72 hours.  Urinalysis: No results for input(s): "COLORURINE", "LABSPEC", "PHURINE", "GLUCOSEU", "HGBUR", "BILIRUBINUR", "KETONESUR", "PROTEINUR", "UROBILINOGEN", "NITRITE", "LEUKOCYTESUR" in the last 72 hours.  Invalid input(s): "APPERANCEUR"    Imaging: US RENAL  Result Date: 10/19/2023 CLINICAL DATA:  Acute renal injury EXAM: RENAL / URINARY TRACT ULTRASOUND COMPLETE COMPARISON:  03/10/2023 FINDINGS: Right Kidney: Renal measurements: 10.2 x 5.2 x 4.9 cm. = volume: 134 mL. Moderate hydronephrosis is noted. This is new from the prior exam. Left Kidney:  Renal measurements: 11.4 x 4.7 x 5.1 cm. = volume: 143 mL. Moderate hydronephrosis is noted similar to that seen CT. Stable 1.9 cm echogenic mass is noted in the midportion of the left kidney. Bladder: Stent is noted within the bladder. The proximal aspect is not well appreciated on this exam. Other: None. IMPRESSION: Moderate hydronephrosis increased on the right when compared with the prior exam and stable on the left. Left ureteral stent. Only the distal aspect of the stent is visualized. Echogenic mass within the left kidney similar to that noted on prior plain film examination. Nonemergent MRI is again recommended for further evaluation. Electronically Signed   By: Alcide Clever M.D.   On: 10/19/2023 20:36   DG Tibia/Fibula Right  Result Date: 10/19/2023 CLINICAL DATA:  Cellulitis.  Bilateral swelling and edema. EXAM: RIGHT TIBIA AND FIBULA - 2 VIEW  COMPARISON:  None Available. FINDINGS: There is diffuse moderate subcutaneous fat edema and swelling. Additional large patient body habitus. Mildly decreased bone mineralization. Severe medial compartment of the knee joint space narrowing with mild peripheral osteophytosis. Moderate to severe patellofemoral joint space narrowing with moderate inferior and mild superior patellar degenerative osteophytes. Mild tibiotalar joint space narrowing. No acute fracture or dislocation. Moderate tarsometatarsal joint space narrowing on lateral view. Moderate atherosclerotic calcifications. IMPRESSION: 1. Diffuse moderate subcutaneous fat edema and swelling. 2. Severe medial compartment and moderate to severe patellofemoral compartment osteoarthritis of the right knee. Electronically Signed   By: Neita Garnet M.D.   On: 10/19/2023 13:56   DG Tibia/Fibula Left  Result Date: 10/19/2023 CLINICAL DATA:  Bilateral leg swelling and edema.  Cellulitis. EXAM: LEFT TIBIA AND FIBULA - 2 VIEW COMPARISON:  Left knee radiographs 05/23/2019 FINDINGS: Status post total left knee arthroplasty. Interval removal of the prior anterior surgical skin staples on the prior radiographs immediately following surgery. Resolution of the prior postsurgical intra-articular air of the left knee. No left knee joint effusion. No perihardware lucency is seen to indicate hardware failure or loosening. No acute fracture or dislocation. Mild tibiotalar osteoarthritis. Moderate tarsometatarsal joint space narrowing, subchondral sclerosis, subchondral cystic change, peripheral osteophytosis diffusely. Moderate diffuse subcutaneous fat edema and swelling. Moderate atherosclerotic calcifications. IMPRESSION: 1. Status post total left knee arthroplasty without evidence of hardware failure or loosening. 2. Moderate partially visualized tarsometatarsal osteoarthritis. 3. Moderate diffuse subcutaneous fat edema and swelling consistent with reported cellulitis.  Electronically Signed   By: Neita Garnet M.D.   On: 10/19/2023 13:55   DG Chest 1 View  Result Date: 10/19/2023 CLINICAL DATA:  CHF EXAM: CHEST  1 VIEW COMPARISON:  Chest x-ray 11/05/2021 FINDINGS: No consolidation, pneumothorax or effusion. No edema. Normal cardiopericardial silhouette. Degenerative changes of the spine. Advanced degenerative changes of the shoulders, left-greater-than-right. IMPRESSION: No acute cardiopulmonary disease. Electronically Signed   By: Karen Kays M.D.   On: 10/19/2023 13:22     Medications:    albumin human 25 g (10/20/23 0752)   cefTRIAXone (ROCEPHIN)  IV 2 g (10/20/23 0943)   furosemide (LASIX) 200 mg in dextrose 5 % 100 mL (2 mg/mL) infusion 5 mg/hr (10/19/23 1614)    allopurinol  50 mg Oral Daily   amiodarone  200 mg Oral Daily   insulin aspart  0-20 Units Subcutaneous TID WC   insulin aspart  0-5 Units Subcutaneous QHS   insulin glargine-yfgn  15 Units Subcutaneous Daily   linagliptin  5 mg Oral Daily   metoprolol succinate  12.5 mg Oral BID   [START ON 10/21/2023] predniSONE  5  mg Oral Q breakfast   Rivaroxaban  15 mg Oral Q supper   sodium chloride flush  10 mL Intravenous Q12H   sodium chloride flush  3 mL Intravenous Q12H   acetaminophen, HYDROcodone-acetaminophen, ondansetron (ZOFRAN) IV, polyethylene glycol, sodium chloride flush  Assessment/ Plan:  [name] is a 89 y.o.  female with past medical history including chronic heart failure, A-fib with recent cardioversion on Xarelto, CAD, and chronic kidney disease stage IV-5, who was admitted to [facility] on 10/19/2023 for CHF (congestive heart failure) (HCC) [I50.9] Bilateral leg pain [M79.604, M79.605] AKI (acute kidney injury) (HCC) [N17.9] Cellulitis, unspecified cellulitis site [L03.90]   Chronic kidney disease stage V/fluid overload.  Baseline creatinine appears to be 3 with GFR of 14 on 09/22/2023.  Patient receiving treatment for diagnosis of pain and discomfort.     Creatinine slightly elevated, not unexpected with IV furosemide drip.  Adequate diuresis underway as urine output overnight 1.3 L.  Will continue albumin to optimize fluid removal.  Discussed with patient and spouse about deteriorating renal function while treating lower extremity edema.  Answered questions about different modalities of dialysis, hemodialysis versus home with dialysis.  Will allow time for patient and spouse to discuss independently.  Will continue current treatments.  Lab Results  Component Value Date   CREATININE 3.22 (H) 10/20/2023   CREATININE 3.11 (H) 10/19/2023   CREATININE 1.85 (H) 08/28/2022    Intake/Output Summary (Last 24 hours) at 10/20/2023 1337 Last data filed at 10/20/2023 0639 Gross per 24 hour  Intake 240 ml  Output 1300 ml  Net -1060 ml   2.  Acute on chronic combined heart failure.  Last echo on 11/06/2021 shows EF 30 to 35% with a grade 2 diastolic dysfunction, moderate mitral valve regurgitation.     3. Diabetes mellitus type II with chronic kidney disease/renal manifestations: insulin dependent. Home regimen includes Lantus, NovoLog and glimepiride. Most recent hemoglobin A1c is 7.4 on 08/27/22.   Primary team to manage sliding scale insulin.   LOS: 1 Priseis Cratty 10/23/20241:37 PM

## 2023-10-20 NOTE — Plan of Care (Signed)
  Problem: Coping: Goal: Ability to adjust to condition or change in health will improve Outcome: Progressing   

## 2023-10-20 NOTE — Progress Notes (Signed)
Progress Note   Patient: Tiffany [name] HQI:[id number] DOB: Jan 28, 1933 DOA: 10/19/2023     1 DOS: the patient was seen and examined on 10/20/2023    Subjective:  Patient seen and examined at bedside in the presence of the # plan Admits to improvement in lower extremity swelling as well as the redness Denies nausea vomiting chest pain or abdominal pain Apparently patient unable to obtain ABI on account of tenderness and pain when blood pressure monitor applied. Overnight patient had urinary retention requiring Foley catheterization and removal This morning still not making enough urine and therefore Foley catheter has been inserted  Brief hospital course: From HPI "Tiffany [name] is a 89 y.o. female with medical history significant of chronic HFrEF with LVEF 30-35%, CAD, CKD stage IV, PAF status post recent cardioversion on Xarelto, recent obstructive labs at ureteral stone status post stenting April 20204,chronic back pain, IDDM with insulin resistance, obesity, presented with worsening of right leg rash and pain. Patient underwent cardioversion earlier this year, this time she denied any palpitations.  She has been taking torsemide 20 mg every day but does not see a significant improvement of her leg swelling. ED Course: Afebrile, nontachycardic blood pressure 109/61 O2 saturation 100% on room air.  WBC 15.3, hemoglobin 12.8, K4.2, creatinine 3.1 compared to 1.82 months ago.  Glucose 263.  "  Assessment and Plan:  Acute on chronic HFrEF decompensation -Anasarca -Strongly suspect cardiorenal syndrome, given the significant status of fluid overload. Diuresis according to nephrologist recommendation Follow-up on echocardiogram Monitor renal function closely   AKI on CKD -Suspect cardiorenal syndrome as patient is significantly fluid overloaded, management as above Uric acid elevated and patient currently on allopurinol Follow-up on renal ultrasound Nephrologist on board and case  discussed   Recurrent right leg cellulitis -Check MRSA and strep a PCR Continue ceftriaxone -Currently there is no signs of MRSA infection   Elevated lactic acid -With fluid overload, suspect cardiorenal syndrome and intravascular depletion.   PAF -Status post cardioversion in April of this year.  Patient denied any palpitations.  At this point unclear what causes her CHF decompensation.  Continue Xarelto   IDDM with hyperglycemia, with insulin resistance Continue Lantus and sliding scale Monitor glucose closely   Recent left-sided hydronephrosis secondary to obstructing ureteral stone Left kidney mass -No urinary symptoms -Renal ultrasound report as shown  Urinary retention Continue Foley catheterization   Chronic LBBB -With low LVEF, consider CRT. Outpatient cardiology follow up.   DVT prophylaxis: Xarelto Code Status: Full code  consults called: Nephrology Admission status: Telemetry admission    Physical Exam: Eyes: PERRL, lids and conjunctivae normal ENMT: Mucous membranes are moist. Posterior pharynx clear of any exudate or lesions.Normal dentition.  Neck: normal, supple, no masses, no thyromegaly Respiratory: clear to auscultation bilaterally Cardiovascular: Regular rate and rhythm, no murmurs / rubs / gallops.  Abdomen: no tenderness, no masses palpated.  Musculoskeletal: no clubbing / cyanosis.  Skin: Significant anasarca on bilateral lower extremity, with weeping clear fluid, rash of bilateral lower extremity right more than left, warm and tenderness to touch Neurologic: CN 2-12 grossly intact. Sensation intact, DTR normal. Strength 5/5 in all 4.  Psychiatric: Normal judgment and insight. Alert and oriented x 3. Normal mood.    Vitals:   10/19/23 2310 10/20/23 0305 10/20/23 0726 10/20/23 1214  BP: (!) 110/46 (!) 140/66 (!) 103/49 (!) 115/53  Pulse: 72 86 75 78  Resp: 16 20 15 16   Temp: 98.7 F (37.1 C) 99.1 F (37.3  C) 98.9 F (37.2 C) 99.3 F (37.4  C)  TempSrc: Oral Oral Axillary Oral  SpO2: 100% 99% 95% 100%  Weight:      Height:        Data Reviewed: I have reviewed patient's lab reports as shown below as well as vitals Renal ultrasound showed moderate hydronephrosis increased on the right when compared with prior imaging and is stable on the left, echogenic mass within the left kidney similar to that noted on prior imaging. Family Communication: Discussed with husband present at bedside  Time spent: 56 minutes    Latest Ref Rng & Units 10/20/2023    3:34 AM 10/19/2023    8:24 AM 08/28/2022    4:49 AM  BMP  Glucose 70 - 99 mg/dL 67  782  956   BUN 8 - 23 mg/dL 84  82  42   Creatinine 0.44 - 1.00 mg/dL 2.13  0.86  5.78   Sodium 135 - 145 mmol/L 134  135  140   Potassium 3.5 - 5.1 mmol/L 4.3  4.2  4.5   Chloride 98 - 111 mmol/L 100  99  108   CO2 22 - 32 mmol/L 24  23  24    Calcium 8.9 - 10.3 mg/dL 8.4  8.9  8.8        Latest Ref Rng & Units 10/19/2023    8:24 AM 08/28/2022    4:49 AM 11/06/2021    4:11 AM  CBC  WBC 4.0 - 10.5 K/uL 15.3  11.2  12.2   Hemoglobin 12.0 - 15.0 g/dL 46.9  62.9  9.9   Hematocrit 36.0 - 46.0 % 40.8  39.2  31.0   Platelets 150 - 400 K/uL 253  238  229     Author: Loyce Dys, MD 10/20/2023 3:14 PM  For on call review [email or web address].

## 2023-10-20 NOTE — Inpatient Diabetes Management (Signed)
Inpatient Diabetes Program Recommendations  AACE/ADA: New Consensus Statement on Inpatient Glycemic Control   Target Ranges:  Prepandial:   less than 140 mg/dL      Peak postprandial:   less than 180 mg/dL (1-2 hours)      Critically ill patients:  140 - 180 mg/dL    Latest Reference Range & Units 10/19/23 13:04 10/19/23 17:03 10/19/23 21:35 10/19/23 22:13  Glucose-Capillary 70 - 99 mg/dL 638 (H) 756 (H) 69 (L) 87   Review of Glycemic Control  Diabetes history: DM2 Outpatient Diabetes medications: Amaryl 2 mg daily, Toujeo 10-14 units daily, Novolog 4-6 units before supper Current orders for Inpatient glycemic control: Semglee 15 units daily, Novolog 0-20 units TID with meals, Novolog 0-5 units at bedtime, Tradjenta 5 mg daily; Prednisone 10 mg QAM (tapering to Prednisone 5 mg daily on 10/24)  Inpatient Diabetes Program Recommendations:    Insulin: CBG 67 mg/dl today. Please consider decreasing Semglee to 13 units daily and Novolog correction to 0-15 units TID with meals.  Thanks, Orlando Penner, RN, MSN, CDCES Diabetes Coordinator Inpatient Diabetes Program [phone] (Team Pager from 8am to 5pm)

## 2023-10-20 NOTE — Progress Notes (Signed)
PHARMACY - PHYSICIAN COMMUNICATION CRITICAL VALUE ALERT - BLOOD CULTURE IDENTIFICATION (BCID)  Tiffany [name] is an 89 y.o. female who presented to [facility] on 10/19/2023 with a chief complaint of cellulitis.   Assessment:  Kleb oxytoca in 1 of 4 bottles, no resistance.   (include suspected source if known)  Name of physician (or Provider) Contacted: [place]  Current antibiotics: Ceftriaxone 2 gm IV Q24H   Changes to prescribed antibiotics recommended:  Patient is on recommended antibiotics - No changes needed  No results found for this or any previous visit.  [name] D 10/20/2023  12:16 AM

## 2023-10-21 DIAGNOSIS — E0800 Diabetes mellitus due to underlying condition with hyperosmolarity without nonketotic hyperglycemic-hyperosmolar coma (NKHHC): Secondary | ICD-10-CM | POA: Diagnosis not present

## 2023-10-21 DIAGNOSIS — E1122 Type 2 diabetes mellitus with diabetic chronic kidney disease: Secondary | ICD-10-CM

## 2023-10-21 DIAGNOSIS — Z794 Long term (current) use of insulin: Secondary | ICD-10-CM

## 2023-10-21 DIAGNOSIS — M79604 Pain in right leg: Secondary | ICD-10-CM | POA: Diagnosis not present

## 2023-10-21 DIAGNOSIS — N189 Chronic kidney disease, unspecified: Secondary | ICD-10-CM

## 2023-10-21 DIAGNOSIS — Z7901 Long term (current) use of anticoagulants: Secondary | ICD-10-CM

## 2023-10-21 DIAGNOSIS — I5043 Acute on chronic combined systolic (congestive) and diastolic (congestive) heart failure: Secondary | ICD-10-CM

## 2023-10-21 DIAGNOSIS — N179 Acute kidney failure, unspecified: Secondary | ICD-10-CM | POA: Diagnosis not present

## 2023-10-21 DIAGNOSIS — Z79899 Other long term (current) drug therapy: Secondary | ICD-10-CM

## 2023-10-21 DIAGNOSIS — I4891 Unspecified atrial fibrillation: Secondary | ICD-10-CM

## 2023-10-21 DIAGNOSIS — L0390 Cellulitis, unspecified: Secondary | ICD-10-CM | POA: Diagnosis not present

## 2023-10-21 LAB — MRSA NEXT GEN BY PCR, NASAL: MRSA by PCR Next Gen: DETECTED — AB

## 2023-10-21 LAB — GLUCOSE, CAPILLARY
Glucose-Capillary: 40 mg/dL — CL (ref 70–99)
Glucose-Capillary: 44 mg/dL — CL (ref 70–99)
Glucose-Capillary: 117 mg/dL — ABNORMAL HIGH (ref 70–99)
Glucose-Capillary: 149 mg/dL — ABNORMAL HIGH (ref 70–99)
Glucose-Capillary: 108 mg/dL — ABNORMAL HIGH (ref 70–99)
Glucose-Capillary: 80 mg/dL (ref 70–99)
Glucose-Capillary: 121 mg/dL — ABNORMAL HIGH (ref 70–99)

## 2023-10-21 LAB — CBC WITH DIFFERENTIAL/PLATELET
Abs Immature Granulocytes: 0.18 10*3/uL — ABNORMAL HIGH (ref 0.00–0.07)
Basophils Absolute: 0 10*3/uL (ref 0.0–0.1)
Basophils Relative: 0 %
Eosinophils Absolute: 0 10*3/uL (ref 0.0–0.5)
Eosinophils Relative: 0 %
HCT: 29.7 % — ABNORMAL LOW (ref 36.0–46.0)
Hemoglobin: 9.7 g/dL — ABNORMAL LOW (ref 12.0–15.0)
Immature Granulocytes: 1 %
Lymphocytes Relative: 4 %
Lymphs Abs: 0.6 10*3/uL — ABNORMAL LOW (ref 0.7–4.0)
MCH: 27.2 pg (ref 26.0–34.0)
MCHC: 32.7 g/dL (ref 30.0–36.0)
MCV: 83.2 fL (ref 80.0–100.0)
Monocytes Absolute: 0.7 10*3/uL (ref 0.1–1.0)
Monocytes Relative: 5 %
Neutro Abs: 12.5 10*3/uL — ABNORMAL HIGH (ref 1.7–7.7)
Neutrophils Relative %: 90 %
Platelets: 166 10*3/uL (ref 150–400)
RBC: 3.57 MIL/uL — ABNORMAL LOW (ref 3.87–5.11)
RDW: 15.9 % — ABNORMAL HIGH (ref 11.5–15.5)
WBC: 14 10*3/uL — ABNORMAL HIGH (ref 4.0–10.5)
nRBC: 0 % (ref 0.0–0.2)

## 2023-10-21 LAB — BASIC METABOLIC PANEL
Anion gap: 11 (ref 5–15)
BUN: 83 mg/dL — ABNORMAL HIGH (ref 8–23)
CO2: 25 mmol/L (ref 22–32)
Calcium: 8.3 mg/dL — ABNORMAL LOW (ref 8.9–10.3)
Chloride: 100 mmol/L (ref 98–111)
Creatinine, Ser: 3.21 mg/dL — ABNORMAL HIGH (ref 0.44–1.00)
GFR, Estimated: 13 mL/min — ABNORMAL LOW (ref >60)
Glucose, Bld: 42 mg/dL — CL (ref 70–99)
Potassium: 3.6 mmol/L (ref 3.5–5.1)
Sodium: 136 mmol/L (ref 135–145)

## 2023-10-21 LAB — HEPATITIS B SURFACE ANTIGEN: Hepatitis B Surface Ag: NONREACTIVE

## 2023-10-21 LAB — HEPATITIS B CORE ANTIBODY, TOTAL: Hep B Core Total Ab: NONREACTIVE

## 2023-10-21 MED ORDER — CHLORHEXIDINE GLUCONATE CLOTH 2 % EX PADS
6.0000 | MEDICATED_PAD | Freq: Every day | CUTANEOUS | Status: DC
  Administered 2023-10-21 – 2023-10-28 (×6): 6 via TOPICAL

## 2023-10-21 MED ORDER — INSULIN GLARGINE-YFGN 100 UNIT/ML ~~LOC~~ SOLN
8.0000 [IU] | Freq: Every day | SUBCUTANEOUS | Status: DC
  Administered 2023-10-21: 8 [IU] via SUBCUTANEOUS
  Filled 2023-10-21 (×2): qty 0.08

## 2023-10-21 MED ORDER — CEFAZOLIN SODIUM-DEXTROSE 2-4 GM/100ML-% IV SOLN
2.0000 g | INTRAVENOUS | Status: DC
  Filled 2023-10-21: qty 100

## 2023-10-21 MED ORDER — DEXTROSE 50 % IV SOLN
INTRAVENOUS | Status: AC
  Administered 2023-10-21: 25 g via INTRAVENOUS
  Filled 2023-10-21: qty 50

## 2023-10-21 MED ORDER — DEXTROSE 50 % IV SOLN: 25.0000 g | Freq: Once | INTRAVENOUS | Status: AC

## 2023-10-21 NOTE — Progress Notes (Addendum)
Hypoglycemic Event  CBG: 44  Treatment: 8 oz juice/soda  Symptoms: Pale  Follow-up CBG: Time:0600 CBG Result: 40  Possible Reasons for Event: Unknown  Comments/MD notified: Administered ampule of D50 per protocol at 0615.   Follow-up CBG: Time:0630 CBG Result: 117    Lamonte Richer

## 2023-10-21 NOTE — H&P (View-Only) (Signed)
MRN : [id number]  Tiffany [name] is a 89 y.o. ([birth year]) female who presents with chief complaint of check access.  History of Present Illness:  I am asked to evaluate the patient by Ms. Breeze.  The patient is an 89 year old woman admitted to [facility] to days ago with the primary complaint of leg pain and leg swelling.  Upon admission she was noted to have progressed from stage IV chronic kidney disease to stage V chronic kidney disease.  Attempts at improving renal function have not been successful and she is now at the point that she needs to initiate dialysis therapy.  The patient volume status has now become an issue. Patient's blood pressures been relatively well controlled. There are significant uremic symptoms.  The patient notes the kidney problem has been present for a long time and has been progressively getting worse.  The patient is followed by nephrology.    The patient has been considering the various methods of dialysis and wishes to proceed with hemodialysis and therefore creation of AV access is indicated.    No recent shortening of the patient's walking distance or new symptoms consistent with claudication.  No history of rest pain symptoms. No new ulcers or wounds of the lower extremities have occurred.  The patient denies amaurosis fugax or recent TIA symptoms. There are no recent neurological changes noted. There is no history of DVT, PE or superficial thrombophlebitis. No recent episodes of angina or shortness of breath documented.   Current Meds  Medication Sig   acetaminophen (TYLENOL) 500 MG tablet Take 500 mg by mouth 3 (three) times daily.   amiodarone (PACERONE) 200 MG tablet Take 200 mg by mouth daily.   glimepiride (AMARYL) 2 MG tablet Take 2 mg by mouth daily with breakfast.   glucose blood (PRECISION QID TEST) test strip Use 2 (two) times daily Use as instructed.   HYDROcodone-acetaminophen (NORCO)  5-325 MG tablet Take 1 tablet by mouth every 6 (six) hours as needed for up to 6 doses for moderate pain. (Patient taking differently: Take 1 tablet by mouth every 6 (six) hours as needed for moderate pain (pain score 4-6). Takes 1-2 daily for pain)   insulin glargine, 1 Unit Dial, (TOUJEO SOLOSTAR) 300 UNIT/ML Solostar Pen Inject 10-14 Units into the skin daily.   Insulin Pen Needle (BD PEN NEEDLE NANO U/F) 32G X 4 MM MISC USE WITH INSULIN PEN TWICE DAILY OR AS DIRECTED   metoprolol succinate (TOPROL-XL) 25 MG 24 hr tablet Take 12.5 mg by mouth daily.   NOVOLOG FLEXPEN 100 UNIT/ML FlexPen Inject 4-6 Units into the skin daily before supper. Sliding scale   polyethylene glycol (MIRALAX / GLYCOLAX) 17 g packet Take 8.5-17 g by mouth daily as needed for moderate constipation. Takes at bedtime   predniSONE (DELTASONE) 5 MG tablet Take 5-30 mg by mouth See admin instructions. 30 mg x1 , 25 mg x 1, 20 mg x 1, 15 mg today 10/22, 10mg  tomorrow 10/23, 5 mg on Thursday then stop   rivaroxaban (XARELTO) 15 MG TABS tablet Take 1 tablet (15 mg total) by mouth daily with breakfast.   torsemide (DEMADEX) 20 MG tablet Take 0.5 tablets (10 mg total) by mouth daily. (Patient taking differently: Take 20 mg by mouth daily. Patient taking daily)   [DISCONTINUED] insulin glargine, 2 Unit Dial, (TOUJEO MAX SOLOSTAR) 300 UNIT/ML Solostar Pen  Inject 10 Units into the skin daily at 6 (six) AM.    Past Medical History:  Diagnosis Date   Anemia    Aortic atherosclerosis (HCC)    Arthritis    CAD (coronary artery disease) 02/27/2015   a.) MPI 02/27/2015: EF 62%, small/mild area of septal hypoperfusion with borderline partial redistribution; equivocal study; b.) LHC 03/13/2015: LVEDP 16 mmHg; 10% mLAD, 10% RCA --> no intervention required (med mgmt); c.) MPI 03/16/2019: EF 56%; normal study.   Cataracts, both eyes    Cervical disc disease    a.) s/p cervical fusion   Chickenpox    CKD (chronic kidney disease), stage IV  (HCC)    Complication of anesthesia    a.) remote h/o PONV; b.) difficult intubation related to remote cervical fusion   Difficult intubation    a.) secondary to remote cervical fusion; reports severe pain s/p being intubated   Dyspnea on exertion    HFrEF (heart failure with reduced ejection fraction) (HCC)    a.) TTE 01/11/2014: EF >55%, mod LVH, MAC, mild RAE, mild MR/TR, sev PR; b.) TTE 11/14/2018: EF 40%, sep and inf HK, mild LAE, triv P, mod MR/TR, G2DD; c.) TTE 11/15/2020: EF 45%, sep HK, mild LVH, triv AR/PR, mild MR/TR, d.) TTE 10/28/2021: EF 45%, mild glob HK, mild LVH, triv AR/PR, mild MR, mod TR; e.) TTE 11/06/2021: EF 30-35%, mid apical and anteroseptal HK, mild TR, mod MR, G2DD.   History of recurrent UTI (urinary tract infection)    Hyperlipidemia    Hypertension    Incontinence in female    wears pads   LBBB (left bundle branch block)    Long term (current) use of anticoagulants    a.) rivaroxaban   Long-term current use of opiate analgesic    a.) hydrocodone/APAP (Norco)   Lumbar disc disease    a.) s/p lumbar interbody fusion L2-L5   PAF (paroxysmal atrial fibrillation) (HCC)    a.) CHA2DS2VASc = 7 (age x2, sex, HFrEF, HTN, vascular diasease, T2DM); b.) s/p DCCV 01/12/2017 (150 J x 1) and 11/04/2021 (120 J x 1); c.) rate/rhythm maintained on oral amiodarone + metoprolol succinate; chronically anticoagulated with rivaroxaban   PONV (postoperative nausea and vomiting)    with 1st pregnancy 50 years ago and no problem since then   Right leg weakness    Sciatica    Spinal stenosis of lumbar region    Type 2 diabetes mellitus treated with insulin (HCC)    Ventral hernia     Past Surgical History:  Procedure Laterality Date   CARDIOVERSION N/A 11/04/2021   Procedure: CARDIOVERSION;  Surgeon: Dalia Heading, MD;  Location: [facility];  Service: Cardiovascular;  Laterality: N/A;   CARDIOVERSION N/A 03/17/2023   Procedure: CARDIOVERSION;  Surgeon: Marcina Millard, MD;   Location: [facility];  Service: Cardiovascular;  Laterality: N/A;   CATARACT EXTRACTION, BILATERAL     CERVICAL LAMINECTOMY  1996   CYSTOSCOPY W/ RETROGRADES  12/06/2020   Procedure: CYSTOSCOPY WITH RETROGRADE PYELOGRAM;  Surgeon: Sondra Come, MD;  Location: [facility];  Service: Urology;;   CYSTOSCOPY W/ URETERAL STENT PLACEMENT Left 04/10/2022   Procedure: CYSTOSCOPY WITH STENT REPLACEMENT;  Surgeon: Sondra Come, MD;  Location: [facility];  Service: Urology;  Laterality: Left;   CYSTOSCOPY W/ URETERAL STENT PLACEMENT Left 03/12/2023   Procedure: CYSTOSCOPY WITH STENT EXCHANGE;  Surgeon: Sondra Come, MD;  Location: [facility];  Service: Urology;  Laterality: Left;   CYSTOSCOPY WITH STENT PLACEMENT Left 12/06/2020  Procedure: CYSTOSCOPY WITH STENT PLACEMENT;  Surgeon: Sondra Come, MD;  Location: [facility];  Service: Urology;  Laterality: Left;   ELECTROPHYSIOLOGIC STUDY N/A 01/12/2017   Procedure: Cardioversion;  Surgeon: Dalia Heading, MD;  Location: [facility];  Service: Cardiovascular;  Laterality: N/A;   INSERTION OF MESH N/A 08/27/2022   Procedure: INSERTION OF MESH;  Surgeon: Sung Amabile, DO;  Location: [facility];  Service: General;  Laterality: N/A;   JOINT REPLACEMENT     LATERAL FUSION LUMBAR SPINE, TRANSVERSE  01/2010   L2-L5   LEFT HEART CATH AND CORONARY ANGIOGRAPHY Left 03/13/2015   Procedure: LEFT HEART CATH AND CORONARY ANGIOGRAPHY; Location: [facility]; Surgeon: Harold Hedge, MD   ROTATOR CUFF REPAIR Right 2010   TONSILLECTOMY     TOTAL KNEE ARTHROPLASTY Left 05/23/2019   Procedure: TOTAL KNEE ARTHROPLASTY - LEFT - DIABETIC;  Surgeon: Christena Flake, MD;  Location: [facility];  Service: Orthopedics;  Laterality: Left;   URETEROSCOPY N/A 12/06/2020   Procedure: DIAGNOSTIC URETEROSCOPY;  Surgeon: Sondra Come, MD;  Location: [facility];  Service: Urology;  Laterality: N/A;   XI ROBOTIC ASSISTED VENTRAL HERNIA N/A 08/27/2022   Procedure: XI ROBOTIC ASSISTED VENTRAL HERNIA;   Surgeon: Sung Amabile, DO;  Location: [facility];  Service: General;  Laterality: N/A;    Social History Social History   Tobacco Use   Smoking status: Never    Passive exposure: Never   Smokeless tobacco: Never  Vaping Use   Vaping status: Never Used  Substance Use Topics   Alcohol use: Yes    Alcohol/week: 1.0 standard drink of alcohol    Types: 1 Glasses of wine per week    Comment: RARE   Drug use: Never    Family History Family History  Adopted: Yes    Allergies  Allergen Reactions   Amlodipine Swelling   Codeine Nausea And Vomiting    Tolerates low doses hydrocodone   Tizanidine     Didn't feel good taking it   Doxycycline Nausea And Vomiting     REVIEW OF SYSTEMS (Negative unless checked)  Constitutional: [] Weight loss  [] Fever  [] Chills Cardiac: [] Chest pain   [] Chest pressure   [] Palpitations   [] Shortness of breath when laying flat   [] Shortness of breath with exertion. Vascular:  [] Pain in legs with walking   [] Pain in legs at rest  [] History of DVT   [] Phlebitis   [] Swelling in legs   [] Varicose veins   [] Non-healing ulcers Pulmonary:   [] Uses home oxygen   [] Productive cough   [] Hemoptysis   [] Wheeze  [] COPD   [] Asthma Neurologic:  [] Dizziness   [] Seizures   [] History of stroke   [] History of TIA  [] Aphasia   [] Vissual changes   [] Weakness or numbness in arm   [] Weakness or numbness in leg Musculoskeletal:   [] Joint swelling   [] Joint pain   [] Low back pain Hematologic:  [] Easy bruising  [] Easy bleeding   [] Hypercoagulable state   [] Anemic Gastrointestinal:  [] Diarrhea   [] Vomiting  [] Gastroesophageal reflux/heartburn   [] Difficulty swallowing. Genitourinary:  [x] Chronic kidney disease   [] Difficult urination  [] Frequent urination   [] Blood in urine Skin:  [] Rashes   [] Ulcers  Psychological:  [] History of anxiety   []  History of major depression.  Physical Examination  Vitals:   10/21/23 0525 10/21/23 0756 10/21/23 1247 10/21/23 2053  BP: 111/62 (!)  118/106 (!) 146/82 (!) 116/58  Pulse: (!) 59 61 72 77  Resp: 18   18  Temp: 98.3  F (36.8 C) (!) 96 F (35.6 C) 98.4 F (36.9 C) 100.2 F (37.9 C)  TempSrc:    Oral  SpO2: 100% 95% 100% 97%  Weight:      Height:       Body mass index is 33.99 kg/m. Gen: WD/WN, NAD Head: [place]/AT, No temporalis wasting.  Ear/Nose/Throat: Hearing grossly intact, nares w/o erythema or drainage Eyes: PER, EOMI, sclera nonicteric.  Neck: Supple, no gross masses or lesions.  No JVD.  Pulmonary:  Good air movement, no audible wheezing, no use of accessory muscles.  Cardiac: RRR, precordium non-hyperdynamic. Vascular:   Neck and chest wall are clean dry and intact no open wounds or sores.  There are numerous bruises on the patient's hands forearms bilaterally and in the antecubital fossa on the left Vessel Right Left  Radial Palpable Palpable  Brachial Palpable Palpable  Gastrointestinal: soft, non-distended. No guarding/no peritoneal signs.  Musculoskeletal: M/S 5/5 throughout.  No deformity.  Neurologic: CN 2-12 intact. Pain and light touch intact in extremities.  Symmetrical.  Speech is fluent. Motor exam as listed above. Psychiatric: Judgment intact, Mood & affect appropriate for pt's clinical situation. Dermatologic: No rashes or ulcers noted.  No changes consistent with cellulitis.   CBC Lab Results  Component Value Date   WBC 14.0 (H) 10/21/2023   HGB 9.7 (L) 10/21/2023   HCT 29.7 (L) 10/21/2023   MCV 83.2 10/21/2023   PLT 166 10/21/2023    BMET    Component Value Date/Time   NA 136 10/21/2023 0505   NA 141 11/12/2021 1504   NA 140 04/20/2012 0543   K 3.6 10/21/2023 0505   K 3.5 04/20/2012 0543   CL 100 10/21/2023 0505   CL 106 04/20/2012 0543   CO2 25 10/21/2023 0505   CO2 26 04/20/2012 0543   GLUCOSE 42 (LL) 10/21/2023 0505   GLUCOSE 163 (H) 04/20/2012 0543   BUN 83 (H) 10/21/2023 0505   BUN 45 (H) 11/12/2021 1504   BUN 19 (H) 04/20/2012 0543   CREATININE 3.21 (H) 10/21/2023  0505   CREATININE 0.86 04/20/2012 0543   CALCIUM 8.3 (L) 10/21/2023 0505   CALCIUM 8.7 04/20/2012 0543   GFRNONAA 13 (L) 10/21/2023 0505   GFRNONAA >60 04/20/2012 0543   GFRAA 18 (L) 01/02/2021 1425   GFRAA >60 04/20/2012 0543   Estimated Creatinine Clearance: 13.1 mL/min (A) (by C-G formula based on SCr of 3.21 mg/dL (H)).  COAG Lab Results  Component Value Date   INR 2.6 (H) 11/06/2021   INR 3.3 (H) 11/05/2021   INR 0.9 04/19/2012    Radiology US RENAL  Result Date: 10/19/2023 CLINICAL DATA:  Acute renal injury EXAM: RENAL / URINARY TRACT ULTRASOUND COMPLETE COMPARISON:  03/10/2023 FINDINGS: Right Kidney: Renal measurements: 10.2 x 5.2 x 4.9 cm. = volume: 134 mL. Moderate hydronephrosis is noted. This is new from the prior exam. Left Kidney: Renal measurements: 11.4 x 4.7 x 5.1 cm. = volume: 143 mL. Moderate hydronephrosis is noted similar to that seen CT. Stable 1.9 cm echogenic mass is noted in the midportion of the left kidney. Bladder: Stent is noted within the bladder. The proximal aspect is not well appreciated on this exam. Other: None. IMPRESSION: Moderate hydronephrosis increased on the right when compared with the prior exam and stable on the left. Left ureteral stent. Only the distal aspect of the stent is visualized. Echogenic mass within the left kidney similar to that noted on prior plain film examination. Nonemergent MRI is again recommended  for further evaluation. Electronically Signed   By: Alcide Clever M.D.   On: 10/19/2023 20:36   DG Tibia/Fibula Right  Result Date: 10/19/2023 CLINICAL DATA:  Cellulitis.  Bilateral swelling and edema. EXAM: RIGHT TIBIA AND FIBULA - 2 VIEW COMPARISON:  None Available. FINDINGS: There is diffuse moderate subcutaneous fat edema and swelling. Additional large patient body habitus. Mildly decreased bone mineralization. Severe medial compartment of the knee joint space narrowing with mild peripheral osteophytosis. Moderate to severe  patellofemoral joint space narrowing with moderate inferior and mild superior patellar degenerative osteophytes. Mild tibiotalar joint space narrowing. No acute fracture or dislocation. Moderate tarsometatarsal joint space narrowing on lateral view. Moderate atherosclerotic calcifications. IMPRESSION: 1. Diffuse moderate subcutaneous fat edema and swelling. 2. Severe medial compartment and moderate to severe patellofemoral compartment osteoarthritis of the right knee. Electronically Signed   By: Neita Garnet M.D.   On: 10/19/2023 13:56   DG Tibia/Fibula Left  Result Date: 10/19/2023 CLINICAL DATA:  Bilateral leg swelling and edema.  Cellulitis. EXAM: LEFT TIBIA AND FIBULA - 2 VIEW COMPARISON:  Left knee radiographs 05/23/2019 FINDINGS: Status post total left knee arthroplasty. Interval removal of the prior anterior surgical skin staples on the prior radiographs immediately following surgery. Resolution of the prior postsurgical intra-articular air of the left knee. No left knee joint effusion. No perihardware lucency is seen to indicate hardware failure or loosening. No acute fracture or dislocation. Mild tibiotalar osteoarthritis. Moderate tarsometatarsal joint space narrowing, subchondral sclerosis, subchondral cystic change, peripheral osteophytosis diffusely. Moderate diffuse subcutaneous fat edema and swelling. Moderate atherosclerotic calcifications. IMPRESSION: 1. Status post total left knee arthroplasty without evidence of hardware failure or loosening. 2. Moderate partially visualized tarsometatarsal osteoarthritis. 3. Moderate diffuse subcutaneous fat edema and swelling consistent with reported cellulitis. Electronically Signed   By: Neita Garnet M.D.   On: 10/19/2023 13:55   DG Chest 1 View  Result Date: 10/19/2023 CLINICAL DATA:  CHF EXAM: CHEST  1 VIEW COMPARISON:  Chest x-ray 11/05/2021 FINDINGS: No consolidation, pneumothorax or effusion. No edema. Normal cardiopericardial silhouette.  Degenerative changes of the spine. Advanced degenerative changes of the shoulders, left-greater-than-right. IMPRESSION: No acute cardiopulmonary disease. Electronically Signed   By: Karen Kays M.D.   On: 10/19/2023 13:22     Assessment/Plan 1.  Acute on chronic renal insufficiency: The patient has had an acute deterioration and now requires hemodialysis.  Given the urgency tunnel catheter placement is recommended.  The risks and benefits have been reviewed all questions been answered patient agrees to proceed.  We will then follow-up in the office where vein mapping can be performed and we will evaluate for upper extremity access as long-term solution.  2.  Acute on chronic combined heart failure.   Last echo on 11/06/2021 shows EF 30 to 35% with a grade 2 diastolic dysfunction, moderate mitral valve regurgitation.  Updated echo ordered.   3. Diabetes mellitus type II with chronic kidney disease/renal manifestations:  insulin dependent. Home regimen includes Lantus, NovoLog and glimepiride. Most recent hemoglobin A1c is 7.4 on 08/27/22.   4.  Atrial fibrillation: Continue antiarrhythmia medications as already ordered, these medications have been reviewed and there are no changes at this time.  Continue anticoagulation as ordered by Cardiology Service     Levora Dredge, MD  10/21/2023 9:33 PM

## 2023-10-21 NOTE — Progress Notes (Signed)
[facility]  ROUNDING NOTE   Subjective:   Patient laying in bed Husband at bedside, assisting with breakfast Lower extremity edema improve  Furosemide drip at 5 mg/h Urine output recorded at 1.9 L  Creatinine 3.21  Objective:  Vital signs in last 24 hours:  Temp:  [96 F (35.6 C)-99.3 F (37.4 C)] 96 F (35.6 C) (10/24 0756) Pulse Rate:  [59-78] 61 (10/24 0756) Resp:  [16-18] 18 (10/24 0525) BP: (97-118)/(50-106) 118/106 (10/24 0756) SpO2:  [95 %-100 %] 95 % (10/24 0756)  Weight change:  Filed Weights   10/19/23 0822  Weight: 89.8 kg    Intake/Output: I/O last 3 completed shifts: In: 325 [I.V.:84.8; IV Piggyback:240.2] Out: 3200 [Urine:3200]   Intake/Output this shift:  No intake/output data recorded.  Physical Exam: General: NAD  Head: Normocephalic, atraumatic. Moist oral mucosal membranes  Eyes: Anicteric  Lungs:  Clear to auscultation  Heart: Regular rate and rhythm  Abdomen:  Soft, nontender,   Extremities: 2+ peripheral edema.  Neurologic: Alert, moving all four extremities  Skin: BLE erythema       Basic Metabolic Panel: Recent Labs  Lab 10/19/23 0824 10/20/23 0334 10/21/23 0505  NA 135 134* 136  K 4.2 4.3 3.6  CL 99 100 100  CO2 23 24 25   GLUCOSE 263* 67* 42*  BUN 82* 84* 83*  CREATININE 3.11* 3.22* 3.21*  CALCIUM 8.9 8.4* 8.3*    Liver Function Tests: No results for input(s): "AST", "ALT", "ALKPHOS", "BILITOT", "PROT", "ALBUMIN" in the last 168 hours. No results for input(s): "LIPASE", "AMYLASE" in the last 168 hours. No results for input(s): "AMMONIA" in the last 168 hours.  CBC: Recent Labs  Lab 10/19/23 0824 10/21/23 0505  WBC 15.3* 14.0*  NEUTROABS  --  12.5*  HGB 12.8 9.7*  HCT 40.8 29.7*  MCV 85.9 83.2  PLT 253 166    Cardiac Enzymes: Recent Labs  Lab 10/19/23 0827  CKTOTAL 16*    BNP: Invalid input(s): "POCBNP"  CBG: Recent Labs  Lab 10/20/23 2125 10/21/23 0540 10/21/23 0600  10/21/23 0630 10/21/23 0757  GLUCAP 104* 44* 40* 117* 149*    Microbiology: Results for orders placed or performed during the hospital encounter of 10/19/23  Blood culture (routine x 2)     Status: None (Preliminary result)   Collection Time: 10/19/23  9:10 AM   Specimen: BLOOD  Result Value Ref Range Status   Specimen Description BLOOD RIGHT WRIST  Final   Special Requests   Final    BOTTLES DRAWN AEROBIC AND ANAEROBIC Blood Culture adequate volume   Culture  Setup Time   Final    Organism ID to follow GRAM NEGATIVE RODS ANAEROBIC BOTTLE ONLY CRITICAL RESULT CALLED TO, READ BACK BY AND VERIFIED WITH: JASON ROBBINS PHARMD @0000  10/20/23 ASW Performed at [facility], [address] [place], [place] [address]    Culture GRAM NEGATIVE RODS  Final   Report Status PENDING  Incomplete  Blood Culture ID Panel (Reflexed)     Status: Abnormal   Collection Time: 10/19/23  9:10 AM  Result Value Ref Range Status   Enterococcus faecalis NOT DETECTED NOT DETECTED Final   Enterococcus Faecium NOT DETECTED NOT DETECTED Final   Listeria monocytogenes NOT DETECTED NOT DETECTED Final   Staphylococcus species NOT DETECTED NOT DETECTED Final   Staphylococcus aureus (BCID) NOT DETECTED NOT DETECTED Final   Staphylococcus epidermidis NOT DETECTED NOT DETECTED Final   Staphylococcus lugdunensis NOT DETECTED NOT DETECTED Final   Streptococcus  species NOT DETECTED NOT DETECTED Final   Streptococcus agalactiae NOT DETECTED NOT DETECTED Final   Streptococcus pneumoniae NOT DETECTED NOT DETECTED Final   Streptococcus pyogenes NOT DETECTED NOT DETECTED Final   A.calcoaceticus-baumannii NOT DETECTED NOT DETECTED Final   Bacteroides fragilis NOT DETECTED NOT DETECTED Final   Enterobacterales DETECTED (A) NOT DETECTED Final    Comment: Enterobacterales represent a large order of gram negative bacteria, not a single organism. CRITICAL RESULT CALLED TO, READ BACK BY AND VERIFIED WITH: JASON  ROBBINS PHARMD @0000  10/20/23 ASW    Enterobacter cloacae complex NOT DETECTED NOT DETECTED Final   Escherichia coli NOT DETECTED NOT DETECTED Final   Klebsiella aerogenes NOT DETECTED NOT DETECTED Final   Klebsiella oxytoca DETECTED (A) NOT DETECTED Final    Comment: CRITICAL RESULT CALLED TO, READ BACK BY AND VERIFIED WITH: JASON ROBBINS PHARMD @0000  10/20/23 ASW    Klebsiella pneumoniae NOT DETECTED NOT DETECTED Final   Proteus species NOT DETECTED NOT DETECTED Final   Salmonella species NOT DETECTED NOT DETECTED Final   Serratia marcescens NOT DETECTED NOT DETECTED Final   Haemophilus influenzae NOT DETECTED NOT DETECTED Final   Neisseria meningitidis NOT DETECTED NOT DETECTED Final   Pseudomonas aeruginosa NOT DETECTED NOT DETECTED Final   Stenotrophomonas maltophilia NOT DETECTED NOT DETECTED Final   Candida albicans NOT DETECTED NOT DETECTED Final   Candida auris NOT DETECTED NOT DETECTED Final   Candida glabrata NOT DETECTED NOT DETECTED Final   Candida krusei NOT DETECTED NOT DETECTED Final   Candida parapsilosis NOT DETECTED NOT DETECTED Final   Candida tropicalis NOT DETECTED NOT DETECTED Final   Cryptococcus neoformans/gattii NOT DETECTED NOT DETECTED Final   CTX-M ESBL NOT DETECTED NOT DETECTED Final   Carbapenem resistance IMP NOT DETECTED NOT DETECTED Final   Carbapenem resistance KPC NOT DETECTED NOT DETECTED Final   Carbapenem resistance NDM NOT DETECTED NOT DETECTED Final   Carbapenem resist OXA 48 LIKE NOT DETECTED NOT DETECTED Final   Carbapenem resistance VIM NOT DETECTED NOT DETECTED Final    Comment: Performed at [facility], [address] [place], [place] [address]  Blood culture (routine x 2)     Status: None (Preliminary result)   Collection Time: 10/19/23 11:45 AM   Specimen: BLOOD  Result Value Ref Range Status   Specimen Description BLOOD LEFT ANTECUBITAL  Final   Special Requests   Final    BOTTLES DRAWN AEROBIC AND ANAEROBIC Blood  Culture adequate volume   Culture   Final    NO GROWTH 2 DAYS Performed at [facility], [address] [place], [place] [address]    Report Status PENDING  Incomplete  Group A Strep by PCR     Status: None   Collection Time: 10/19/23  4:15 PM   Specimen: Throat; Sterile Swab  Result Value Ref Range Status   Group A Strep by PCR NOT DETECTED NOT DETECTED Final    Comment: Performed at [facility], [address] [place], [place] [address]    Coagulation Studies: No results for input(s): "LABPROT", "INR" in the last 72 hours.  Urinalysis: No results for input(s): "COLORURINE", "LABSPEC", "PHURINE", "GLUCOSEU", "HGBUR", "BILIRUBINUR", "KETONESUR", "PROTEINUR", "UROBILINOGEN", "NITRITE", "LEUKOCYTESUR" in the last 72 hours.  Invalid input(s): "APPERANCEUR"    Imaging: US RENAL  Result Date: 10/19/2023 CLINICAL DATA:  Acute renal injury EXAM: RENAL / URINARY TRACT ULTRASOUND COMPLETE COMPARISON:  03/10/2023 FINDINGS: Right Kidney: Renal measurements: 10.2 x 5.2 x 4.9 cm. = volume: 134 mL. Moderate  hydronephrosis is noted. This is new from the prior exam. Left Kidney: Renal measurements: 11.4 x 4.7 x 5.1 cm. = volume: 143 mL. Moderate hydronephrosis is noted similar to that seen CT. Stable 1.9 cm echogenic mass is noted in the midportion of the left kidney. Bladder: Stent is noted within the bladder. The proximal aspect is not well appreciated on this exam. Other: None. IMPRESSION: Moderate hydronephrosis increased on the right when compared with the prior exam and stable on the left. Left ureteral stent. Only the distal aspect of the stent is visualized. Echogenic mass within the left kidney similar to that noted on prior plain film examination. Nonemergent MRI is again recommended for further evaluation. Electronically Signed   By: Alcide Clever M.D.   On: 10/19/2023 20:36   DG Tibia/Fibula Right  Result Date: 10/19/2023 CLINICAL DATA:  Cellulitis.  Bilateral  swelling and edema. EXAM: RIGHT TIBIA AND FIBULA - 2 VIEW COMPARISON:  None Available. FINDINGS: There is diffuse moderate subcutaneous fat edema and swelling. Additional large patient body habitus. Mildly decreased bone mineralization. Severe medial compartment of the knee joint space narrowing with mild peripheral osteophytosis. Moderate to severe patellofemoral joint space narrowing with moderate inferior and mild superior patellar degenerative osteophytes. Mild tibiotalar joint space narrowing. No acute fracture or dislocation. Moderate tarsometatarsal joint space narrowing on lateral view. Moderate atherosclerotic calcifications. IMPRESSION: 1. Diffuse moderate subcutaneous fat edema and swelling. 2. Severe medial compartment and moderate to severe patellofemoral compartment osteoarthritis of the right knee. Electronically Signed   By: Neita Garnet M.D.   On: 10/19/2023 13:56   DG Tibia/Fibula Left  Result Date: 10/19/2023 CLINICAL DATA:  Bilateral leg swelling and edema.  Cellulitis. EXAM: LEFT TIBIA AND FIBULA - 2 VIEW COMPARISON:  Left knee radiographs 05/23/2019 FINDINGS: Status post total left knee arthroplasty. Interval removal of the prior anterior surgical skin staples on the prior radiographs immediately following surgery. Resolution of the prior postsurgical intra-articular air of the left knee. No left knee joint effusion. No perihardware lucency is seen to indicate hardware failure or loosening. No acute fracture or dislocation. Mild tibiotalar osteoarthritis. Moderate tarsometatarsal joint space narrowing, subchondral sclerosis, subchondral cystic change, peripheral osteophytosis diffusely. Moderate diffuse subcutaneous fat edema and swelling. Moderate atherosclerotic calcifications. IMPRESSION: 1. Status post total left knee arthroplasty without evidence of hardware failure or loosening. 2. Moderate partially visualized tarsometatarsal osteoarthritis. 3. Moderate diffuse subcutaneous fat  edema and swelling consistent with reported cellulitis. Electronically Signed   By: Neita Garnet M.D.   On: 10/19/2023 13:55     Medications:    albumin human 25 g (10/21/23 1008)   cefTRIAXone (ROCEPHIN)  IV 2 g (10/21/23 1021)   furosemide (LASIX) 200 mg in dextrose 5 % 100 mL (2 mg/mL) infusion 5 mg/hr (10/21/23 0334)    allopurinol  50 mg Oral Daily   amiodarone  200 mg Oral Daily   Chlorhexidine Gluconate Cloth  6 each Topical Q0600   insulin aspart  0-15 Units Subcutaneous TID WC   insulin aspart  0-5 Units Subcutaneous QHS   insulin glargine-yfgn  8 Units Subcutaneous Daily   linagliptin  5 mg Oral Daily   metoprolol succinate  12.5 mg Oral BID   Rivaroxaban  15 mg Oral Q supper   sodium chloride flush  10 mL Intravenous Q12H   sodium chloride flush  3 mL Intravenous Q12H   acetaminophen, HYDROcodone-acetaminophen, ondansetron (ZOFRAN) IV, polyethylene glycol, sodium chloride flush  Assessment/ Plan:  Ms. Srinika Drewniak is a 89  y.o.  female with past medical history including chronic heart failure, A-fib with recent cardioversion on Xarelto, CAD, and chronic kidney disease stage IV-5, who was admitted to [facility] on 10/19/2023 for CHF (congestive heart failure) (HCC) [I50.9] Bilateral leg pain [M79.604, M79.605] AKI (acute kidney injury) (HCC) [N17.9] Cellulitis, unspecified cellulitis site [L03.90]   Chronic kidney disease stage V/fluid overload.  Baseline creatinine appears to be 3 with GFR of 14 on 09/22/2023.  Patient receiving treatment for diagnosis of pain and discomfort.    Creatinine stable today with adequate urine output. Discussed renal function in relation to fluid overload and management. Patient and family are agreeable to initiate hemodialysis. Vascular surgery consulted to place tunneled access tomorrow. Will initiate dialysis after placement. Patient will receive 3 treatments inpatient. Renal navigator aware of patient and will seek outpatient dialysis clinic.    Lab Results  Component Value Date   CREATININE 3.21 (H) 10/21/2023   CREATININE 3.22 (H) 10/20/2023   CREATININE 3.11 (H) 10/19/2023    Intake/Output Summary (Last 24 hours) at 10/21/2023 1105 Last data filed at 10/21/2023 0525 Gross per 24 hour  Intake 325 ml  Output 1900 ml  Net -1575 ml   2.  Acute on chronic combined heart failure.  Last echo on 11/06/2021 shows EF 30 to 35% with a grade 2 diastolic dysfunction, moderate mitral valve regurgitation.    Continue IV furosemide drip for fluid management. Will utilize dialysis for fluid control also   3. Diabetes mellitus type II with chronic kidney disease/renal manifestations: insulin dependent. Home regimen includes Lantus, NovoLog and glimepiride. Most recent hemoglobin A1c is 7.4 on 08/27/22.   Primary team to manage sliding scale insulin.   LOS: 2 Keesha Pellum 10/24/202411:05 AM

## 2023-10-21 NOTE — Progress Notes (Signed)
Physical Therapy Treatment Patient Details Name: Tiffany [name] MRN: [id number] DOB: 1933/06/18 Today's Date: 10/21/2023   History of Present Illness Patient is a 89 y.o. female with medical history significant of chronic HFrEF with LVEF 30-35%, CAD, CKD stage IV, PAF status post recent cardioversion on Xarelto, recent obstructive labs at ureteral stone status post stenting,chronic back pain, IDDM with insulin resistance, obesity, presented with worsening of right leg rash and pain. Current MD assessment: Acute on chronic CHF, AKI, and Recurrent R leg cellulitis.    PT Comments  Pt was pleasant and with cues and encouragement, she was motivated to participate during the session and put forth good effort throughout. Pt is Mod a +2 for bed mobility primarily due to RLE pain management and tenderness. She continues to be CGA for STS's from elevated surface, and once up was able to perform short ~5 foot amb taking a few steps forwards and backwards with RW, needing Min A for RW management during backwards steps, but otherwise CGA for balance. Pt having slight SOB, but SpO2 and HR remained WFL on RA. Pt will benefit from continued PT services upon discharge to safely address deficits listed in patient problem list for decreased caregiver assistance and eventual return to PLOF.      If plan is discharge home, recommend the following: Assist for transportation;Help with stairs or ramp for entrance;Assistance with cooking/housework;Two people to help with walking and/or transfers;Two people to help with bathing/dressing/bathroom   Can travel by private vehicle        Equipment Recommendations  Other (comment) (TBD)    Recommendations for Other Services       Precautions / Restrictions Precautions Precautions: Fall Restrictions Weight Bearing Restrictions: No     Mobility  Bed Mobility Overal bed mobility: Needs Assistance Bed Mobility: Supine to Sit, Sit to Supine     Supine to sit: +2  for physical assistance, Mod assist Sit to supine: +2 for physical assistance, Mod assist   General bed mobility comments: Needs +2 assist due to management of RLE    Transfers Overall transfer level: Needs assistance Equipment used: Rolling walker (2 wheels) Transfers: Sit to/from Stand Sit to Stand: From elevated surface, Contact guard assist           General transfer comment: Pt continues to stand without physical assist form elevated surface    Ambulation/Gait Ambulation/Gait assistance: Min assist Gait Distance (Feet): 5 Feet Assistive device: Rolling walker (2 wheels) Gait Pattern/deviations: Decreased step length - right, Decreased step length - left, Decreased stride length, Trunk flexed, Decreased stance time - right Gait velocity: decreased     General Gait Details: Pt able to perform a few steps forwards and backwards, needing min A for RW management only, otherwise CGA. No imbalance noted, light SOB noted but SpO2 and HR remained WFL.   Stairs             Wheelchair Mobility     Tilt Bed    Modified Rankin (Stroke Patients Only)       Balance Overall balance assessment: Needs assistance Sitting-balance support: Feet supported Sitting balance-Leahy Scale: Good     Standing balance support: During functional activity, Bilateral upper extremity supported Standing balance-Leahy Scale: Fair Standing balance comment: light hold on RW, static standing                            Cognition Arousal: Alert Behavior During Therapy: [facility] for tasks assessed/performed  Overall Cognitive Status: Within Functional Limits for tasks assessed                                          Exercises Total Joint Exercises Ankle Circles/Pumps: AROM, 10 reps, Both Quad Sets: AROM, Both, 10 reps Gluteal Sets: AROM, Both, 10 reps Hip ABduction/ADduction: AAROM, Right, 10 reps    General Comments        Pertinent Vitals/Pain Pain  Assessment Pain Assessment: Faces Faces Pain Scale: Hurts little more Pain Location: R leg Pain Descriptors / Indicators: Sore, Tender, Grimacing, Guarding Pain Intervention(s): Limited activity within patient's tolerance, Monitored during session    Home Living                          Prior Function            PT Goals (current goals can now be found in the care plan section) Progress towards PT goals: Progressing toward goals    Frequency    Min 1X/week      PT Plan      Co-evaluation              AM-PAC PT "6 Clicks" Mobility   Outcome Measure  Help needed turning from your back to your side while in a flat bed without using bedrails?: A Lot Help needed moving from lying on your back to sitting on the side of a flat bed without using bedrails?: A Lot Help needed moving to and from a bed to a chair (including a wheelchair)?: A Lot Help needed standing up from a chair using your arms (e.g., wheelchair or bedside chair)?: A Little Help needed to walk in hospital room?: A Little Help needed climbing 3-5 steps with a railing? : A Lot 6 Click Score: 14    End of Session Equipment Utilized During Treatment: Gait belt Activity Tolerance: Patient tolerated treatment well;Patient limited by pain Patient left: in bed;with bed alarm set;with call bell/phone within reach Nurse Communication: Mobility status PT Visit Diagnosis: Muscle weakness (generalized) (M62.81);Pain Pain - Right/Left: Right Pain - part of body: Leg     Time: 1610-9604 PT Time Calculation (min) (ACUTE ONLY): 34 min  Charges:                           Cecile Sheerer, SPT 10/21/23, 5:03 PM

## 2023-10-21 NOTE — Progress Notes (Signed)
Progress Note   Patient: Tiffany [name] IRS:[id number] DOB: [birth year] DOA: 10/19/2023     2 DOS: the patient was seen and examined on 10/21/2023     Subjective:  Patient seen and examined at bedside in the presence of the husband Denies nausea vomiting abdominal pain chest pain or cough She admits to improvement in lower extremity swelling Have agreed with nephrology for dialysis Vascular surgeon have been contacted for permacath placement   Brief hospital course: From HPI "Marely Natali is a 89 y.o. female with medical history significant of chronic HFrEF with LVEF 30-35%, CAD, CKD stage IV, PAF status post recent cardioversion on Xarelto, recent obstructive labs at ureteral stone status post stenting April 20204,chronic back pain, IDDM with insulin resistance, obesity, presented with worsening of right leg rash and pain. Patient underwent cardioversion earlier this year, this time she denied any palpitations.  She has been taking torsemide 20 mg every day but does not see a significant improvement of her leg swelling. ED Course: Afebrile, nontachycardic blood pressure 109/61 O2 saturation 100% on room air.  WBC 15.3, hemoglobin 12.8, K4.2, creatinine 3.1 compared to 1.82 months ago.  Glucose 263.  "   Assessment and Plan:   Acute on chronic HFrEF decompensation -Anasarca -Strongly suspect cardiorenal syndrome, given the significant status of fluid overload. Diuresis according to nephrologist recommendation Follow-up on echocardiogram Monitor renal function closely   AKI on CKD -Suspect cardiorenal syndrome as patient is significantly fluid overloaded, management as above Uric acid elevated and patient currently on allopurinol Follow-up on renal ultrasound Nephrologist on board and case discussed  Have agreed with nephrology for dialysis Vascular surgeon have been contacted for permacath placement  Recurrent right leg cellulitis -Check MRSA and strep a PCR Continue  ceftriaxone Monitor blood culture results -Currently there is no signs of MRSA infection   Elevated lactic acid -With fluid overload, suspect cardiorenal syndrome and intravascular depletion.   PAF -Status post cardioversion in April of this year.  Patient denied any palpitations.  At this point unclear what causes her CHF decompensation.  Continue Xarelto   IDDM with hyperglycemia, with insulin resistance as well as hypoglycemia Insulin adjusted for hypoglycemia Monitor glucose closely   Recent left-sided hydronephrosis secondary to obstructing ureteral stone Left kidney mass -No urinary symptoms -Renal ultrasound report as shown   Urinary retention Continue Foley catheterization   Chronic LBBB -With low LVEF, consider CRT. Outpatient cardiology follow up.   DVT prophylaxis: Xarelto Code Status: Full code   consults called: Nephrology Admission status: Telemetry admission     Physical Exam: Eyes: PERRL, lids and conjunctivae normal ENMT: Mucous membranes are moist. Posterior pharynx clear of any exudate or lesions.Normal dentition.  Neck: normal, supple, no masses, no thyromegaly Respiratory: clear to auscultation bilaterally Cardiovascular: Regular rate and rhythm, no murmurs / rubs / gallops.  Abdomen: no tenderness, no masses palpated.  Musculoskeletal: no clubbing / cyanosis.  Skin: Erythema noted involving the right lower extremities, bilateral pitting edema noted to the lower extremities Neurologic: CN 2-12 grossly intact. Sensation intact, DTR normal. Strength 5/5 in all 4.  Psychiatric: Normal judgment and insight. Alert and oriented x 3. Normal mood.        Data Reviewed: I have reviewed patient's lab results as well as vitals as shown below, [facility] manager documentation, nephrology documentation, vascular surgeons documentation  Family Communication: Discussed with husband present at bedside   Time spent: 46 minutes     Latest Ref Rng & Units 10/21/2023  5:05 AM 10/20/2023    3:34 AM 10/19/2023    8:24 AM  BMP  Glucose 70 - 99 mg/dL 42  67  161   BUN 8 - 23 mg/dL 83  84  82   Creatinine 0.44 - 1.00 mg/dL 0.96  0.45  4.09   Sodium 135 - 145 mmol/L 136  134  135   Potassium 3.5 - 5.1 mmol/L 3.6  4.3  4.2   Chloride 98 - 111 mmol/L 100  100  99   CO2 22 - 32 mmol/L 25  24  23    Calcium 8.9 - 10.3 mg/dL 8.3  8.4  8.9     Vitals:   10/20/23 2355 10/21/23 0525 10/21/23 0756 10/21/23 1247  BP: (!) 97/59 111/62 (!) 118/106 (!) 146/82  Pulse: 67 (!) 59 61 72  Resp: 18 18    Temp: 98.6 F (37 C) 98.3 F (36.8 C) (!) 96 F (35.6 C) 98.4 F (36.9 C)  TempSrc: Oral     SpO2: 97% 100% 95% 100%  Weight:      Height:          Latest Ref Rng & Units 10/21/2023    5:05 AM 10/19/2023    8:24 AM 08/28/2022    4:49 AM  CBC  WBC 4.0 - 10.5 K/uL 14.0  15.3  11.2   Hemoglobin 12.0 - 15.0 g/dL 9.7  81.1  91.4   Hematocrit 36.0 - 46.0 % 29.7  40.8  39.2   Platelets 150 - 400 K/uL 166  253  238      Author: Loyce Dys, MD 10/21/2023 4:17 PM  For on call review [email or web address].

## 2023-10-21 NOTE — Consult Note (Signed)
 MRN : [id number]  Tiffany [name] is a 89 y.o. (Jul 13, 1933) female who presents with chief complaint of check access.  History of Present Illness:  I am asked to evaluate the patient by Ms. Breeze.  The patient is an 89 year old woman admitted to [facility] to days ago with the primary complaint of leg pain and leg swelling.  Upon admission she was noted to have progressed from stage IV chronic kidney disease to stage V chronic kidney disease.  Attempts at improving renal function have not been successful and she is now at the point that she needs to initiate dialysis therapy.  The patient volume status has now become an issue. Patient's blood pressures been relatively well controlled. There are significant uremic symptoms.  The patient notes the kidney problem has been present for a long time and has been progressively getting worse.  The patient is followed by nephrology.    The patient has been considering the various methods of dialysis and wishes to proceed with hemodialysis and therefore creation of AV access is indicated.    No recent shortening of the patient's walking distance or new symptoms consistent with claudication.  No history of rest pain symptoms. No new ulcers or wounds of the lower extremities have occurred.  The patient denies amaurosis fugax or recent TIA symptoms. There are no recent neurological changes noted. There is no history of DVT, PE or superficial thrombophlebitis. No recent episodes of angina or shortness of breath documented.   Current Meds  Medication Sig   acetaminophen  (TYLENOL ) 500 MG tablet Take 500 mg by mouth 3 (three) times daily.   amiodarone  (PACERONE ) 200 MG tablet Take 200 mg by mouth daily.   glimepiride  (AMARYL ) 2 MG tablet Take 2 mg by mouth daily with breakfast.   glucose blood (PRECISION QID TEST) test strip Use 2 (two) times daily Use as instructed.   HYDROcodone -acetaminophen  (NORCO)  5-325 MG tablet Take 1 tablet by mouth every 6 (six) hours as needed for up to 6 doses for moderate pain. (Patient taking differently: Take 1 tablet by mouth every 6 (six) hours as needed for moderate pain (pain score 4-6). Takes 1-2 daily for pain)   insulin  glargine, 1 Unit Dial, (TOUJEO  SOLOSTAR) 300 UNIT/ML Solostar Pen Inject 10-14 Units into the skin daily.   Insulin  Pen Needle (BD PEN NEEDLE NANO U/F) 32G X 4 MM MISC USE WITH INSULIN  PEN TWICE DAILY OR AS DIRECTED   metoprolol  succinate (TOPROL -XL) 25 MG 24 hr tablet Take 12.5 mg by mouth daily.   NOVOLOG  FLEXPEN 100 UNIT/ML FlexPen Inject 4-6 Units into the skin daily before supper. Sliding scale   polyethylene glycol (MIRALAX  / GLYCOLAX ) 17 g packet Take 8.5-17 g by mouth daily as needed for moderate constipation. Takes at bedtime   predniSONE  (DELTASONE ) 5 MG tablet Take 5-30 mg by mouth See admin instructions. 30 mg x1 , 25 mg x 1, 20 mg x 1, 15 mg today 10/22, 10mg  tomorrow 10/23, 5 mg on Thursday then stop   rivaroxaban  (XARELTO ) 15 MG TABS tablet Take 1 tablet (15 mg total) by mouth daily with breakfast.   torsemide  (DEMADEX ) 20 MG tablet Take 0.5 tablets (10 mg total) by mouth daily. (Patient taking differently: Take 20 mg by mouth daily. Patient taking daily)   [DISCONTINUED] insulin  glargine, 2 Unit Dial, (TOUJEO  MAX SOLOSTAR) 300 UNIT/ML Solostar Pen  Inject 10 Units into the skin daily at 6 (six) AM.    Past Medical History:  Diagnosis Date   Anemia    Aortic atherosclerosis (HCC)    Arthritis    CAD (coronary artery disease) 02/27/2015   a.) MPI 02/27/2015: EF 62%, small/mild area of septal hypoperfusion with borderline partial redistribution; equivocal study; b.) LHC 03/13/2015: LVEDP 16 mmHg; 10% mLAD, 10% RCA --> no intervention required (med mgmt); c.) MPI 03/16/2019: EF 56%; normal study.   Cataracts, both eyes    Cervical disc disease    a.) s/p cervical fusion   Chickenpox    CKD (chronic kidney disease), stage IV  (HCC)    Complication of anesthesia    a.) remote h/o PONV; b.) difficult intubation related to remote cervical fusion   Difficult intubation    a.) secondary to remote cervical fusion; reports severe pain s/p being intubated   Dyspnea on exertion    HFrEF (heart failure with reduced ejection fraction) (HCC)    a.) TTE 01/11/2014: EF >55%, mod LVH, MAC, mild RAE, mild MR/TR, sev PR; b.) TTE 11/14/2018: EF 40%, sep and inf HK, mild LAE, triv P, mod MR/TR, G2DD; c.) TTE 11/15/2020: EF 45%, sep HK, mild LVH, triv AR/PR, mild MR/TR, d.) TTE 10/28/2021: EF 45%, mild glob HK, mild LVH, triv AR/PR, mild MR, mod TR; e.) TTE 11/06/2021: EF 30-35%, mid apical and anteroseptal HK, mild TR, mod MR, G2DD.   History of recurrent UTI (urinary tract infection)    Hyperlipidemia    Hypertension    Incontinence in female    wears pads   LBBB (left bundle branch block)    Long term (current) use of anticoagulants    a.) rivaroxaban    Long-term current use of opiate analgesic    a.) hydrocodone /APAP (Norco)   Lumbar disc disease    a.) s/p lumbar interbody fusion L2-L5   PAF (paroxysmal atrial fibrillation) (HCC)    a.) CHA2DS2VASc = 7 (age x2, sex, HFrEF, HTN, vascular diasease, T2DM); b.) s/p DCCV 01/12/2017 (150 J x 1) and 11/04/2021 (120 J x 1); c.) rate/rhythm maintained on oral amiodarone  + metoprolol  succinate; chronically anticoagulated with rivaroxaban    PONV (postoperative nausea and vomiting)    with 1st pregnancy 50 years ago and no problem since then   Right leg weakness    Sciatica    Spinal stenosis of lumbar region    Type 2 diabetes mellitus treated with insulin  (HCC)    Ventral hernia     Past Surgical History:  Procedure Laterality Date   CARDIOVERSION N/A 11/04/2021   Procedure: CARDIOVERSION;  Surgeon: Bosie Vinie LABOR, MD;  Location: [facility];  Service: Cardiovascular;  Laterality: N/A;   CARDIOVERSION N/A 03/17/2023   Procedure: CARDIOVERSION;  Surgeon: Ammon Blunt, MD;   Location: [facility];  Service: Cardiovascular;  Laterality: N/A;   CATARACT EXTRACTION, BILATERAL     CERVICAL LAMINECTOMY  1996   CYSTOSCOPY W/ RETROGRADES  12/06/2020   Procedure: CYSTOSCOPY WITH RETROGRADE PYELOGRAM;  Surgeon: Francisca Redell BROCKS, MD;  Location: [facility];  Service: Urology;;   CYSTOSCOPY W/ URETERAL STENT PLACEMENT Left 04/10/2022   Procedure: CYSTOSCOPY WITH STENT REPLACEMENT;  Surgeon: Francisca Redell BROCKS, MD;  Location: [facility];  Service: Urology;  Laterality: Left;   CYSTOSCOPY W/ URETERAL STENT PLACEMENT Left 03/12/2023   Procedure: CYSTOSCOPY WITH STENT EXCHANGE;  Surgeon: Francisca Redell BROCKS, MD;  Location: [facility];  Service: Urology;  Laterality: Left;   CYSTOSCOPY WITH STENT PLACEMENT Left 12/06/2020  Procedure: CYSTOSCOPY WITH STENT PLACEMENT;  Surgeon: Francisca Redell BROCKS, MD;  Location: [facility];  Service: Urology;  Laterality: Left;   ELECTROPHYSIOLOGIC STUDY N/A 01/12/2017   Procedure: Cardioversion;  Surgeon: Vinie DELENA Jude, MD;  Location: [facility];  Service: Cardiovascular;  Laterality: N/A;   INSERTION OF MESH N/A 08/27/2022   Procedure: INSERTION OF MESH;  Surgeon: Tye Millet, DO;  Location: [facility];  Service: General;  Laterality: N/A;   JOINT REPLACEMENT     LATERAL FUSION LUMBAR SPINE, TRANSVERSE  01/2010   L2-L5   LEFT HEART CATH AND CORONARY ANGIOGRAPHY Left 03/13/2015   Procedure: LEFT HEART CATH AND CORONARY ANGIOGRAPHY; Location: [facility]; Surgeon: Vinie Jude, MD   ROTATOR CUFF REPAIR Right 2010   TONSILLECTOMY     TOTAL KNEE ARTHROPLASTY Left 05/23/2019   Procedure: TOTAL KNEE ARTHROPLASTY - LEFT - DIABETIC;  Surgeon: Edie Norleen PARAS, MD;  Location: [facility];  Service: Orthopedics;  Laterality: Left;   URETEROSCOPY N/A 12/06/2020   Procedure: DIAGNOSTIC URETEROSCOPY;  Surgeon: Francisca Redell BROCKS, MD;  Location: [facility];  Service: Urology;  Laterality: N/A;   XI ROBOTIC ASSISTED VENTRAL HERNIA N/A 08/27/2022   Procedure: XI ROBOTIC ASSISTED VENTRAL HERNIA;   Surgeon: Tye Millet, DO;  Location: [facility];  Service: General;  Laterality: N/A;    Social History Social History   Tobacco Use   Smoking status: Never    Passive exposure: Never   Smokeless tobacco: Never  Vaping Use   Vaping status: Never Used  Substance Use Topics   Alcohol  use: Yes    Alcohol /week: 1.0 standard drink of alcohol     Types: 1 Glasses of wine per week    Comment: RARE   Drug use: Never    Family History Family History  Adopted: Yes    Allergies  Allergen Reactions   Amlodipine Swelling   Codeine Nausea And Vomiting    Tolerates low doses hydrocodone    Tizanidine     Didn't feel good taking it   Doxycycline Nausea And Vomiting     REVIEW OF SYSTEMS (Negative unless checked)  Constitutional: [] Weight loss  [] Fever  [] Chills Cardiac: [] Chest pain   [] Chest pressure   [] Palpitations   [] Shortness of breath when laying flat   [] Shortness of breath with exertion. Vascular:  [] Pain in legs with walking   [] Pain in legs at rest  [] History of DVT   [] Phlebitis   [] Swelling in legs   [] Varicose veins   [] Non-healing ulcers Pulmonary:   [] Uses home oxygen   [] Productive cough   [] Hemoptysis   [] Wheeze  [] COPD   [] Asthma Neurologic:  [] Dizziness   [] Seizures   [] History of stroke   [] History of TIA  [] Aphasia   [] Vissual changes   [] Weakness or numbness in arm   [] Weakness or numbness in leg Musculoskeletal:   [] Joint swelling   [] Joint pain   [] Low back pain Hematologic:  [] Easy bruising  [] Easy bleeding   [] Hypercoagulable state   [] Anemic Gastrointestinal:  [] Diarrhea   [] Vomiting  [] Gastroesophageal reflux/heartburn   [] Difficulty swallowing. Genitourinary:  [x] Chronic kidney disease   [] Difficult urination  [] Frequent urination   [] Blood in urine Skin:  [] Rashes   [] Ulcers  Psychological:  [] History of anxiety   []  History of major depression.  Physical Examination  Vitals:   10/21/23 0525 10/21/23 0756 10/21/23 1247 10/21/23 2053  BP: 111/62 (!)  118/106 (!) 146/82 (!) 116/58  Pulse: (!) 59 61 72 77  Resp: 18   18  Temp: 98.3  F (36.8 C) (!) 96 F (35.6 C) 98.4 F (36.9 C) 100.2 F (37.9 C)  TempSrc:    Oral  SpO2: 100% 95% 100% 97%  Weight:      Height:       Body mass index is 33.99 kg/m. Gen: WD/WN, NAD Head: [place]/AT, No temporalis wasting.  Ear/Nose/Throat: Hearing grossly intact, nares w/o erythema or drainage Eyes: PER, EOMI, sclera nonicteric.  Neck: Supple, no gross masses or lesions.  No JVD.  Pulmonary:  Good air movement, no audible wheezing, no use of accessory muscles.  Cardiac: RRR, precordium non-hyperdynamic. Vascular:   Neck and chest wall are clean dry and intact no open wounds or sores.  There are numerous bruises on the patient's hands forearms bilaterally and in the antecubital fossa on the left Vessel Right Left  Radial Palpable Palpable  Brachial Palpable Palpable  Gastrointestinal: soft, non-distended. No guarding/no peritoneal signs.  Musculoskeletal: M/S 5/5 throughout.  No deformity.  Neurologic: CN 2-12 intact. Pain and light touch intact in extremities.  Symmetrical.  Speech is fluent. Motor exam as listed above. Psychiatric: Judgment intact, Mood & affect appropriate for pt's clinical situation. Dermatologic: No rashes or ulcers noted.  No changes consistent with cellulitis.   CBC Lab Results  Component Value Date   WBC 14.0 (H) 10/21/2023   HGB 9.7 (L) 10/21/2023   HCT 29.7 (L) 10/21/2023   MCV 83.2 10/21/2023   PLT 166 10/21/2023    BMET    Component Value Date/Time   NA 136 10/21/2023 0505   NA 141 11/12/2021 1504   NA 140 04/20/2012 0543   K 3.6 10/21/2023 0505   K 3.5 04/20/2012 0543   CL 100 10/21/2023 0505   CL 106 04/20/2012 0543   CO2 25 10/21/2023 0505   CO2 26 04/20/2012 0543   GLUCOSE 42 (LL) 10/21/2023 0505   GLUCOSE 163 (H) 04/20/2012 0543   BUN 83 (H) 10/21/2023 0505   BUN 45 (H) 11/12/2021 1504   BUN 19 (H) 04/20/2012 0543   CREATININE 3.21 (H) 10/21/2023  0505   CREATININE 0.86 04/20/2012 0543   CALCIUM 8.3 (L) 10/21/2023 0505   CALCIUM 8.7 04/20/2012 0543   GFRNONAA 13 (L) 10/21/2023 0505   GFRNONAA >60 04/20/2012 0543   GFRAA 18 (L) 01/02/2021 1425   GFRAA >60 04/20/2012 0543   Estimated Creatinine Clearance: 13.1 mL/min (A) (by C-G formula based on SCr of 3.21 mg/dL (H)).  COAG Lab Results  Component Value Date   INR 2.6 (H) 11/06/2021   INR 3.3 (H) 11/05/2021   INR 0.9 04/19/2012    Radiology US  RENAL  Result Date: 10/19/2023 CLINICAL DATA:  Acute renal injury EXAM: RENAL / URINARY TRACT ULTRASOUND COMPLETE COMPARISON:  03/10/2023 FINDINGS: Right Kidney: Renal measurements: 10.2 x 5.2 x 4.9 cm. = volume: 134 mL. Moderate hydronephrosis is noted. This is new from the prior exam. Left Kidney: Renal measurements: 11.4 x 4.7 x 5.1 cm. = volume: 143 mL. Moderate hydronephrosis is noted similar to that seen CT. Stable 1.9 cm echogenic mass is noted in the midportion of the left kidney. Bladder: Stent is noted within the bladder. The proximal aspect is not well appreciated on this exam. Other: None. IMPRESSION: Moderate hydronephrosis increased on the right when compared with the prior exam and stable on the left. Left ureteral stent. Only the distal aspect of the stent is visualized. Echogenic mass within the left kidney similar to that noted on prior plain film examination. Nonemergent MRI is again recommended  for further evaluation. Electronically Signed   By: Oneil Devonshire M.D.   On: 10/19/2023 20:36   DG Tibia/Fibula Right  Result Date: 10/19/2023 CLINICAL DATA:  Cellulitis.  Bilateral swelling and edema. EXAM: RIGHT TIBIA AND FIBULA - 2 VIEW COMPARISON:  None Available. FINDINGS: There is diffuse moderate subcutaneous fat edema and swelling. Additional large patient body habitus. Mildly decreased bone mineralization. Severe medial compartment of the knee joint space narrowing with mild peripheral osteophytosis. Moderate to severe  patellofemoral joint space narrowing with moderate inferior and mild superior patellar degenerative osteophytes. Mild tibiotalar joint space narrowing. No acute fracture or dislocation. Moderate tarsometatarsal joint space narrowing on lateral view. Moderate atherosclerotic calcifications. IMPRESSION: 1. Diffuse moderate subcutaneous fat edema and swelling. 2. Severe medial compartment and moderate to severe patellofemoral compartment osteoarthritis of the right knee. Electronically Signed   By: Tanda Lyons M.D.   On: 10/19/2023 13:56   DG Tibia/Fibula Left  Result Date: 10/19/2023 CLINICAL DATA:  Bilateral leg swelling and edema.  Cellulitis. EXAM: LEFT TIBIA AND FIBULA - 2 VIEW COMPARISON:  Left knee radiographs 05/23/2019 FINDINGS: Status post total left knee arthroplasty. Interval removal of the prior anterior surgical skin staples on the prior radiographs immediately following surgery. Resolution of the prior postsurgical intra-articular air of the left knee. No left knee joint effusion. No perihardware lucency is seen to indicate hardware failure or loosening. No acute fracture or dislocation. Mild tibiotalar osteoarthritis. Moderate tarsometatarsal joint space narrowing, subchondral sclerosis, subchondral cystic change, peripheral osteophytosis diffusely. Moderate diffuse subcutaneous fat edema and swelling. Moderate atherosclerotic calcifications. IMPRESSION: 1. Status post total left knee arthroplasty without evidence of hardware failure or loosening. 2. Moderate partially visualized tarsometatarsal osteoarthritis. 3. Moderate diffuse subcutaneous fat edema and swelling consistent with reported cellulitis. Electronically Signed   By: Tanda Lyons M.D.   On: 10/19/2023 13:55   DG Chest 1 View  Result Date: 10/19/2023 CLINICAL DATA:  CHF EXAM: CHEST  1 VIEW COMPARISON:  Chest x-ray 11/05/2021 FINDINGS: No consolidation, pneumothorax or effusion. No edema. Normal cardiopericardial silhouette.  Degenerative changes of the spine. Advanced degenerative changes of the shoulders, left-greater-than-right. IMPRESSION: No acute cardiopulmonary disease. Electronically Signed   By: Ranell Bring M.D.   On: 10/19/2023 13:22     Assessment/Plan 1.  Acute on chronic renal insufficiency: The patient has had an acute deterioration and now requires hemodialysis.  Given the urgency tunnel catheter placement is recommended.  The risks and benefits have been reviewed all questions been answered patient agrees to proceed.  We will then follow-up in the office where vein mapping can be performed and we will evaluate for upper extremity access as long-term solution.  2.  Acute on chronic combined heart failure.   Last echo on 11/06/2021 shows EF 30 to 35% with a grade 2 diastolic dysfunction, moderate mitral valve regurgitation.  Updated echo ordered.   3. Diabetes mellitus type II with chronic kidney disease/renal manifestations:  insulin  dependent. Home regimen includes Lantus , NovoLog  and glimepiride . Most recent hemoglobin A1c is 7.4 on 08/27/22.   4.  Atrial fibrillation: Continue antiarrhythmia medications as already ordered, these medications have been reviewed and there are no changes at this time.  Continue anticoagulation as ordered by Cardiology Service     Cordella Shawl, MD  10/21/2023 9:33 PM

## 2023-10-21 NOTE — TOC Progression Note (Signed)
Transition of Care [facility]) - Progression Note    Patient Details  Name: Tiffany [name] MRN: [id number] Date of Birth: [birth year]  [facility] CM/SW Contact  Truddie Hidden, RN Phone Number: 10/21/2023, 3:03 PM  Clinical Narrative:    Spoke with patient regarding discharge plan and therapy's recommendation for SNF. Patient refused SNF. She stated her husband is a physician and he would be able to assist. He will also transport her home at discharge. Patient has a WC, walker, and BSC at home. She is agreeable to HHPT/ OT and does not have a preference of an agency. She has been advised advised the accepting agency will contact hem directly to scheduled SOC within 48 post discharge.  Referral sent to [facility] from [facility].         Expected Discharge Plan and Services                                               Social Determinants of Health (SDOH) Interventions SDOH Screenings   Food Insecurity: No Food Insecurity (10/19/2023)  Housing: Low Risk  (10/19/2023)  Transportation Needs: No Transportation Needs (10/19/2023)  Utilities: Not At Risk (10/19/2023)  Depression (PHQ2-9): Low Risk  (02/20/2019)  Financial Resource Strain: Low Risk  (09/29/2023)   Received from [facility]  Tobacco Use: Low Risk  (10/19/2023)    Readmission Risk Interventions     No data to display

## 2023-10-22 ENCOUNTER — Encounter
Admission: EM | Disposition: A | Discharge status: Skilled Nursing Facility | Payer: Self-pay | Source: Home / Self Care | Attending: Internal Medicine

## 2023-10-22 ENCOUNTER — Other Ambulatory Visit: Payer: PPO

## 2023-10-22 ENCOUNTER — Inpatient Hospital Stay: Payer: PPO

## 2023-10-22 ENCOUNTER — Encounter: Payer: Self-pay | Admitting: Vascular Surgery

## 2023-10-22 DIAGNOSIS — N189 Chronic kidney disease, unspecified: Secondary | ICD-10-CM | POA: Diagnosis not present

## 2023-10-22 DIAGNOSIS — N179 Acute kidney failure, unspecified: Secondary | ICD-10-CM | POA: Diagnosis not present

## 2023-10-22 DIAGNOSIS — E0800 Diabetes mellitus due to underlying condition with hyperosmolarity without nonketotic hyperglycemic-hyperosmolar coma (NKHHC): Secondary | ICD-10-CM | POA: Diagnosis not present

## 2023-10-22 DIAGNOSIS — M79604 Pain in right leg: Secondary | ICD-10-CM | POA: Diagnosis not present

## 2023-10-22 DIAGNOSIS — L0390 Cellulitis, unspecified: Secondary | ICD-10-CM | POA: Diagnosis not present

## 2023-10-22 HISTORY — PX: DIALYSIS/PERMA CATHETER INSERTION: CATH118288

## 2023-10-22 LAB — BASIC METABOLIC PANEL
Anion gap: 10 (ref 5–15)
BUN: 75 mg/dL — ABNORMAL HIGH (ref 8–23)
CO2: 28 mmol/L (ref 22–32)
Calcium: 8.4 mg/dL — ABNORMAL LOW (ref 8.9–10.3)
Chloride: 100 mmol/L (ref 98–111)
Creatinine, Ser: 2.9 mg/dL — ABNORMAL HIGH (ref 0.44–1.00)
GFR, Estimated: 15 mL/min — ABNORMAL LOW (ref >60)
Glucose, Bld: 47 mg/dL — ABNORMAL LOW (ref 70–99)
Potassium: 3.5 mmol/L (ref 3.5–5.1)
Sodium: 138 mmol/L (ref 135–145)

## 2023-10-22 LAB — GLUCOSE, CAPILLARY
Glucose-Capillary: 49 mg/dL — ABNORMAL LOW (ref 70–99)
Glucose-Capillary: 126 mg/dL — ABNORMAL HIGH (ref 70–99)
Glucose-Capillary: 63 mg/dL — ABNORMAL LOW (ref 70–99)
Glucose-Capillary: 119 mg/dL — ABNORMAL HIGH (ref 70–99)
Glucose-Capillary: 96 mg/dL (ref 70–99)
Glucose-Capillary: 185 mg/dL — ABNORMAL HIGH (ref 70–99)
Glucose-Capillary: 78 mg/dL (ref 70–99)
Glucose-Capillary: 105 mg/dL — ABNORMAL HIGH (ref 70–99)
Glucose-Capillary: 187 mg/dL — ABNORMAL HIGH (ref 70–99)

## 2023-10-22 LAB — CBC WITH DIFFERENTIAL/PLATELET
Abs Immature Granulocytes: 0.07 10*3/uL (ref 0.00–0.07)
Basophils Absolute: 0 10*3/uL (ref 0.0–0.1)
Basophils Relative: 0 %
Eosinophils Absolute: 0.2 10*3/uL (ref 0.0–0.5)
Eosinophils Relative: 1 %
HCT: 29.3 % — ABNORMAL LOW (ref 36.0–46.0)
Hemoglobin: 9.4 g/dL — ABNORMAL LOW (ref 12.0–15.0)
Immature Granulocytes: 1 %
Lymphocytes Relative: 9 %
Lymphs Abs: 1.1 10*3/uL (ref 0.7–4.0)
MCH: 27.1 pg (ref 26.0–34.0)
MCHC: 32.1 g/dL (ref 30.0–36.0)
MCV: 84.4 fL (ref 80.0–100.0)
Monocytes Absolute: 0.6 10*3/uL (ref 0.1–1.0)
Monocytes Relative: 6 %
Neutro Abs: 9.5 10*3/uL — ABNORMAL HIGH (ref 1.7–7.7)
Neutrophils Relative %: 83 %
Platelets: 163 10*3/uL (ref 150–400)
RBC: 3.47 MIL/uL — ABNORMAL LOW (ref 3.87–5.11)
RDW: 16.1 % — ABNORMAL HIGH (ref 11.5–15.5)
WBC: 11.5 10*3/uL — ABNORMAL HIGH (ref 4.0–10.5)
nRBC: 0 % (ref 0.0–0.2)

## 2023-10-22 LAB — CULTURE, BLOOD (ROUTINE X 2): Special Requests: ADEQUATE

## 2023-10-22 LAB — HEPATITIS B SURFACE ANTIBODY, QUANTITATIVE: Hep B S AB Quant (Post): <3.5 m[IU]/mL — ABNORMAL LOW

## 2023-10-22 SURGERY — DIALYSIS/PERMA CATHETER INSERTION
Anesthesia: Moderate Sedation | Laterality: Right

## 2023-10-22 MED ORDER — INSULIN GLARGINE-YFGN 100 UNIT/ML ~~LOC~~ SOLN
5.0000 [IU] | Freq: Every day | SUBCUTANEOUS | Status: DC
  Administered 2023-10-25: 5 [IU] via SUBCUTANEOUS
  Filled 2023-10-22 (×5): qty 0.05

## 2023-10-22 MED ORDER — DEXTROSE 50 % IV SOLN
1.0000 | Freq: Once | INTRAVENOUS | Status: AC
  Administered 2023-10-22: 50 mL via INTRAVENOUS

## 2023-10-22 MED ORDER — HEPARIN SODIUM (PORCINE) 1000 UNIT/ML IJ SOLN
INTRAMUSCULAR | Status: AC
  Filled 2023-10-22: qty 10

## 2023-10-22 MED ORDER — ACETAMINOPHEN 500 MG PO TABS
ORAL_TABLET | ORAL | Status: AC
  Filled 2023-10-22: qty 1

## 2023-10-22 MED ORDER — HEPARIN (PORCINE) IN NACL 1000-0.9 UT/500ML-% IV SOLN
INTRAVENOUS | Status: DC | PRN
  Administered 2023-10-22: 500 mL

## 2023-10-22 MED ORDER — HEPARIN SODIUM (PORCINE) 10000 UNIT/ML IJ SOLN
INTRAMUSCULAR | Status: DC | PRN
  Administered 2023-10-22: 10000 [IU]

## 2023-10-22 MED ORDER — DEXTROSE 50 % IV SOLN
INTRAVENOUS | Status: AC
Start: 2023-10-22 — End: ?
  Filled 2023-10-22: qty 50

## 2023-10-22 MED ORDER — MIDAZOLAM HCL 2 MG/2ML IJ SOLN
INTRAMUSCULAR | Status: AC
  Filled 2023-10-22: qty 2

## 2023-10-22 MED ORDER — DEXTROSE 50 % IV SOLN
INTRAVENOUS | Status: AC
  Filled 2023-10-22: qty 50

## 2023-10-22 MED ORDER — FENTANYL CITRATE PF 50 MCG/ML IJ SOSY
PREFILLED_SYRINGE | INTRAMUSCULAR | Status: AC
  Filled 2023-10-22: qty 1

## 2023-10-22 MED ORDER — PROSOURCE PLUS PO LIQD
30.0000 mL | Freq: Three times a day (TID) | ORAL | Status: DC
  Administered 2023-10-23 – 2023-10-27 (×11): 30 mL via ORAL
  Filled 2023-10-22 (×18): qty 30

## 2023-10-22 MED ORDER — DEXTROSE 50 % IV SOLN
25.0000 mL | Freq: Once | INTRAVENOUS | Status: AC
  Administered 2023-10-22: 25 mL via INTRAVENOUS

## 2023-10-22 MED ORDER — MIDAZOLAM HCL 2 MG/2ML IJ SOLN
INTRAMUSCULAR | Status: DC | PRN
  Administered 2023-10-22: 1 mg via INTRAVENOUS
  Administered 2023-10-22: .5 mg via INTRAVENOUS

## 2023-10-22 MED ORDER — RENA-VITE PO TABS
1.0000 | ORAL_TABLET | Freq: Every day | ORAL | Status: DC
  Administered 2023-10-24 – 2023-10-27 (×5): 1 via ORAL
  Filled 2023-10-22 (×6): qty 1

## 2023-10-22 MED ORDER — CEFAZOLIN (ANCEF) 1 G IV SOLR: 1.0000 g | INTRAVENOUS | Status: DC

## 2023-10-22 MED ORDER — FENTANYL CITRATE (PF) 100 MCG/2ML IJ SOLN
INTRAMUSCULAR | Status: DC | PRN
  Administered 2023-10-22: 12.5 ug via INTRAVENOUS
  Administered 2023-10-22: 25 ug via INTRAVENOUS

## 2023-10-22 MED ORDER — CHLORHEXIDINE GLUCONATE CLOTH 2 % EX PADS
6.0000 | MEDICATED_PAD | Freq: Every day | CUTANEOUS | Status: AC
  Administered 2023-10-22 – 2023-10-26 (×5): 6 via TOPICAL

## 2023-10-22 MED ORDER — CEFAZOLIN SODIUM-DEXTROSE 1-4 GM/50ML-% IV SOLN
INTRAVENOUS | Status: AC
  Filled 2023-10-22: qty 50

## 2023-10-22 MED ORDER — ALTEPLASE 2 MG IJ SOLR: 2.0000 mg | Freq: Once | INTRAMUSCULAR | Status: DC | PRN

## 2023-10-22 MED ORDER — GADOBUTROL 1 MMOL/ML IV SOLN
7.5000 mL | Freq: Once | INTRAVENOUS | Status: AC | PRN
Start: 2023-10-22 — End: 2023-10-22
  Administered 2023-10-22: 7.5 mL via INTRAVENOUS

## 2023-10-22 MED ORDER — HEPARIN SODIUM (PORCINE) 1000 UNIT/ML DIALYSIS: 1000.0000 [IU] | INTRAMUSCULAR | Status: DC | PRN

## 2023-10-22 MED ORDER — LIDOCAINE-EPINEPHRINE (PF) 1 %-1:200000 IJ SOLN
INTRAMUSCULAR | Status: DC | PRN
  Administered 2023-10-22: 20 mL

## 2023-10-22 MED ORDER — MUPIROCIN 2 % EX OINT
1.0000 | TOPICAL_OINTMENT | Freq: Two times a day (BID) | CUTANEOUS | Status: AC
  Administered 2023-10-22 – 2023-10-26 (×9): 1 via NASAL
  Filled 2023-10-22: qty 22

## 2023-10-22 MED ORDER — CEFAZOLIN SODIUM-DEXTROSE 1-4 GM/50ML-% IV SOLN
1.0000 g | INTRAVENOUS | Status: AC
  Administered 2023-10-22: 1 g via INTRAVENOUS

## 2023-10-22 SURGICAL SUPPLY — 10 items
ADH SKN CLS APL DERMABOND .7 (GAUZE/BANDAGES/DRESSINGS) ×1
BIOPATCH RED 1 DISK 7.0 (GAUZE/BANDAGES/DRESSINGS) IMPLANT
CATH CANNON HEMO 15FR 19 (HEMODIALYSIS SUPPLIES) IMPLANT
COVER PROBE ULTRASOUND 5X96 (MISCELLANEOUS) IMPLANT
DERMABOND ADVANCED .7 DNX12 (GAUZE/BANDAGES/DRESSINGS) IMPLANT
NDL ENTRY 21GA 7CM ECHOTIP (NEEDLE) IMPLANT
NEEDLE ENTRY 21GA 7CM ECHOTIP (NEEDLE) ×1 IMPLANT
SET INTRO CAPELLA COAXIAL (SET/KITS/TRAYS/PACK) IMPLANT
SUT MNCRL AB 4-0 PS2 18 (SUTURE) IMPLANT
SUT SILK 0 FSL (SUTURE) IMPLANT

## 2023-10-22 NOTE — Progress Notes (Signed)
Night of surgery note  Patient sitting in bed quite comfortable she underwent 2 hours of dialysis without incident and actually is surprised at how well she tolerated it.  Tunnel catheter is clean dry and intact there is actually a small amount of bruising compared to that seen typically.  I discussed with patient that she will follow-up with me in the office and we can evaluate for upper extremity access at that time.

## 2023-10-22 NOTE — Progress Notes (Signed)
Pt refuses wound care and ACE wraps to bilateral LEs.

## 2023-10-22 NOTE — Progress Notes (Signed)
Progress Note   Patient: Tiffany [name] ZOX:[id number] DOB: 1933-05-02 DOA: 10/19/2023     3 DOS: the patient was seen and examined on 10/22/2023  Subjective:  Patient underwent permacath placement today as well as hemodialysis Denies nausea vomiting abdominal pain or chest pain I discussed with patient as well as patient's husband concerning finding of renal mass and recommendation to have a follow-up MRI. Patient has agreed for follow-up MRI to be obtained   Brief hospital course: From HPI "Tiffany [name] is a 89 y.o. female with medical history significant of chronic HFrEF with LVEF 30-35%, CAD, CKD stage IV, PAF status post recent cardioversion on Xarelto, recent obstructive labs at ureteral stone status post stenting April 20204,chronic back pain, IDDM with insulin resistance, obesity, presented with worsening of right leg rash and pain. Patient underwent cardioversion earlier this year, this time she denied any palpitations.  She has been taking torsemide 20 mg every day but does not see a significant improvement of her leg swelling. ED Course: Afebrile, nontachycardic blood pressure 109/61 O2 saturation 100% on room air.  WBC 15.3, hemoglobin 12.8, K4.2, creatinine 3.1 compared to 1.82 months ago.  Glucose 263.  "   Assessment and Plan:   Acute on chronic HFrEF decompensation -Anasarca -Strongly suspect cardiorenal syndrome, given the significant status of fluid overload. Diuresis according to nephrologist recommendation Follow-up on echocardiogram Monitor renal function closely   AKI on CKD -Suspect cardiorenal syndrome as patient is significantly fluid overloaded, management as above Uric acid elevated and patient currently on allopurinol Plan of care discussed with nephrologist  Have agreed with nephrology for dialysis Vascular surgeon has placed permacath for hemodialysis on 10/22/2023   Recurrent right leg cellulitis -Check MRSA and strep a PCR Continue Monitor  blood culture results -Currently there is no signs of MRSA infection   Elevated lactic acid -With fluid overload, suspect cardiorenal syndrome and intravascular depletion.   PAF -Status post cardioversion in April of this year.  Patient denied any palpitations.  At this point unclear what causes her CHF decompensation.  Continue Xarelto   IDDM with hyperglycemia, with insulin resistance as well as hypoglycemia Insulin adjusted for hypoglycemia Monitor glucose closely   Recent left-sided hydronephrosis secondary to obstructing ureteral stone Left kidney mass -No urinary symptoms -Renal ultrasound report as shown   Urinary retention Continue Foley catheterization   Chronic LBBB -With low LVEF, consider CRT. Outpatient cardiology follow up.   DVT prophylaxis: Xarelto Code Status: Full code   consults called: Nephrology Admission status: Telemetry admission     Physical Exam: Eyes: PERRL, lids and conjunctivae normal ENMT: Mucous membranes are moist. Posterior pharynx clear of any exudate or lesions.Normal dentition.  Neck: normal, supple, no masses, no thyromegaly Respiratory: clear to auscultation bilaterally Cardiovascular: Regular rate and rhythm, no murmurs / rubs / gallops.  Abdomen: no tenderness, no masses palpated.  Musculoskeletal: no clubbing / cyanosis.  Skin: Erythema noted involving the right lower extremities, bilateral pitting edema noted to the lower extremities Neurologic: CN 2-12 grossly intact. Sensation intact, DTR normal. Strength 5/5 in all 4.  Psychiatric: Normal judgment and insight. Alert and oriented x 3. Normal mood.        Data Reviewed: I have reviewed patient lab reports as shown   Family Communication: Discussed with husband present at bedside   Time spent: 42 minutes      Latest Ref Rng & Units 10/22/2023    3:53 AM 10/21/2023    5:05 AM 10/20/2023  3:34 AM  BMP  Glucose 70 - 99 mg/dL 47  42  67   BUN 8 - 23 mg/dL 75  83  84    Creatinine 0.44 - 1.00 mg/dL 1.61  0.96  0.45   Sodium 135 - 145 mmol/L 138  136  134   Potassium 3.5 - 5.1 mmol/L 3.5  3.6  4.3   Chloride 98 - 111 mmol/L 100  100  100   CO2 22 - 32 mmol/L 28  25  24    Calcium 8.9 - 10.3 mg/dL 8.4  8.3  8.4      Vitals:   10/22/23 1430 10/22/23 1451 10/22/23 1500 10/22/23 1540  BP: 128/75 (!) 134/57  (!) 126/59  Pulse: 72 65  66  Resp: 15 20  18   Temp:  98.4 F (36.9 C)  97.6 F (36.4 C)  TempSrc:  Oral    SpO2: 99% 97%  100%  Weight:   86.8 kg   Height:          Latest Ref Rng & Units 10/22/2023    3:53 AM 10/21/2023    5:05 AM 10/19/2023    8:24 AM  CBC  WBC 4.0 - 10.5 K/uL 11.5  14.0  15.3   Hemoglobin 12.0 - 15.0 g/dL 9.4  9.7  40.9   Hematocrit 36.0 - 46.0 % 29.3  29.7  40.8   Platelets 150 - 400 K/uL 163  166  253       Author: Loyce Dys, MD 10/22/2023 3:59 PM  For on call review [email or web address].

## 2023-10-22 NOTE — Progress Notes (Signed)
Initial Nutrition Assessment  DOCUMENTATION CODES:   Obesity unspecified  INTERVENTION:   -Once diet is advanced:  -30 ml Prosource Plus TID, each supplement provides 100 kcals and 15 grams protein -Renal MVI daily -Magic cup BID with meals, each supplement provides 290 kcal and 9 grams of protein  -RD provided "Nutrition for Dialysis" handout from [facility]; attached to AVS/. Discharge summary   NUTRITION DIAGNOSIS:   Increased nutrient needs related to chronic illness (CHF) as evidenced by estimated needs.  GOAL:   Patient will meet greater than or equal to 90% of their needs  MONITOR:   PO intake, Supplement acceptance  REASON FOR ASSESSMENT:   Consult Diet education, Assessment of nutrition requirement/status  ASSESSMENT:   Pt with medical history significant of chronic HFrEF with LVEF 30-35%, CAD, CKD stage IV, PAF status post recent cardioversion on Xarelto, recent obstructive labs at ureteral stone status post stenting April 20204,chronic back pain, IDDM with insulin resistance, obesity, presented with worsening of right leg rash and pain.  Pt admitted with acute on chronic CHF decompensation, anasarca, AKI on CKD, and recurrent rt leg cellulitis.   10/25- s/p Insertion of tunneled dialysis catheter right IJ approach with ultrasound and fluoroscopic guidance   Reviewed I/O's: -2.5 L x 24 hours and -5.1 L since admission  Pt unavailable at time of visit. Pt down in cath lab at time of visit. RD unable to obtain further nutrition-related history or complete nutrition-focused physical exam at this time.    Pt previously on a carb modified diet. No meal completion data available to assess at this time.   Per nephrology notes, pt and husband agreeable to HD; plan treatment today after catheter placement.   Reviewed wt hx; pt has experienced a 2.4% wt loss over the past 7 months, which is not significant for time frame. Pt with anasarca,  which is likely masking true weight loss as well as fat and muscle depletions  Medications reviewed and include IV albumin and lasix.   Lab Results  Component Value Date   HGBA1C 8.9 (H) 10/19/2023   PTA DM medications are 4-6 units insulin aspart TID, 10-14 units insulin glargine daily, and 2 mg glimepiride daily.   Labs reviewed: CBGS: 49-185 (inpatient orders for glycemic control are 5 mg linagliptin daily, 0-15 units insulin aspart TID with meals, 0-5 units insulin aspart daily at bedtime, and 5 units insulin glargine-yfgn daily).  Noted multiple hypoglycemic episodes.   Diet Order:   Diet Order             Diet NPO time specified  Diet effective midnight                   EDUCATION NEEDS:   No education needs have been identified at this time  Skin:  Skin Assessment: Reviewed RN Assessment  Last BM:  Unknown  Height:   Ht Readings from Last 1 Encounters:  10/22/23 5\' 3"  (1.6 m)    Weight:   Wt Readings from Last 1 Encounters:  10/22/23 90.3 kg    Ideal Body Weight:  52.3 kg  BMI:  Body mass index is 35.26 kg/m.  Estimated Nutritional Needs:   Kcal:  1650-1850  Protein:  75-90 grams  Fluid:  1000 ml + UOP    Levada Schilling, RD, LDN, CDCES Registered Dietitian III Certified Diabetes Care and Education Specialist Please refer to [facility] for RD and/or RD on-call/weekend/after hours pager

## 2023-10-22 NOTE — Progress Notes (Signed)
Hypoglycemic Event  CBG: 49  Treatment: D50 25 mL (12.5 gm)  Symptoms: None  Follow-up CBG: Time:0447 CBG Result:126  Comments/MD notified: Coverage provider made aware, Geradine Girt, NP    Jinelle Butchko [place] Bickers

## 2023-10-22 NOTE — Plan of Care (Signed)
  Problem: Education: Goal: Ability to describe self-care measures that may prevent or decrease complications (Diabetes Survival Skills Education) will improve Outcome: Progressing Goal: Individualized Educational Video(s) Outcome: Progressing   Problem: Coping: Goal: Ability to adjust to condition or change in health will improve Outcome: Progressing   Problem: Fluid Volume: Goal: Ability to maintain a balanced intake and output will improve Outcome: Progressing   Problem: Health Behavior/Discharge Planning: Goal: Ability to identify and utilize available resources and services will improve Outcome: Progressing Goal: Ability to manage health-related needs will improve Outcome: Progressing   Problem: Nutritional: Goal: Maintenance of adequate nutrition will improve Outcome: Progressing Goal: Progress toward achieving an optimal weight will improve Outcome: Progressing   Problem: Metabolic: Goal: Ability to maintain appropriate glucose levels will improve Outcome: Progressing   Problem: Skin Integrity: Goal: Risk for impaired skin integrity will decrease Outcome: Progressing   Problem: Tissue Perfusion: Goal: Adequacy of tissue perfusion will improve Outcome: Progressing   Problem: Education: Goal: Knowledge of General Education information will improve Description: Including pain rating scale, medication(s)/side effects and non-pharmacologic comfort measures Outcome: Progressing   Problem: Health Behavior/Discharge Planning: Goal: Ability to manage health-related needs will improve Outcome: Progressing   Problem: Clinical Measurements: Goal: Ability to maintain clinical measurements within normal limits will improve Outcome: Progressing Goal: Will remain free from infection Outcome: Progressing Goal: Diagnostic test results will improve Outcome: Progressing Goal: Respiratory complications will improve Outcome: Progressing Goal: Cardiovascular complication will  be avoided Outcome: Progressing   Problem: Activity: Goal: Risk for activity intolerance will decrease Outcome: Progressing   Problem: Pain Management: Goal: General experience of comfort will improve Outcome: Progressing   Problem: Safety: Goal: Ability to remain free from injury will improve Outcome: Progressing   Problem: Skin Integrity: Goal: Risk for impaired skin integrity will decrease Outcome: Progressing   Problem: Nutritional: Goal: Ability to make healthy dietary choices will improve Outcome: Progressing   Problem: Health Behavior/Discharge Planning: Goal: Ability to manage health-related needs will improve Outcome: Progressing   Problem: Clinical Measurements: Goal: Complications related to the disease process, condition or treatment will be avoided or minimized Outcome: Progressing   Problem: Nutritional: Goal: Ability to make healthy dietary choices will improve Outcome: Progressing

## 2023-10-22 NOTE — Progress Notes (Signed)
Hemodialysis note  Received patient in bed to unit. Alert and oriented.  Informed consent signed and in chart.  Treatment initiated: 1239 Treatment completed: 1451  Patient tolerated well. Transported back to room, alert without acute distress.  Report given to patient's RN.   Access used: Right Chest HD Catheter Access issues: none  Total UF removed: 0 Medication(s) given:  Tylenol 500 mg tab PO  Post HD weight: 86.8 kg   Wolfgang Phoenix [facility]

## 2023-10-22 NOTE — Inpatient Diabetes Management (Signed)
Inpatient Diabetes Program Recommendations  AACE/ADA: New Consensus Statement on Inpatient Glycemic Control (2015)  Target Ranges:  Prepandial:   less than 140 mg/dL      Peak postprandial:   less than 180 mg/dL (1-2 hours)      Critically ill patients:  140 - 180 mg/dL    Latest Reference Range & Units 10/22/23 04:29 10/22/23 04:47 10/22/23 08:05  Glucose-Capillary 70 - 99 mg/dL 49 (L) 161 (H) 63 (L)  (L): Data is abnormally low (H): Data is abnormally high     Home DM Meds: Amaryl 2 mg daily     Toujeo 10-14 units daily      Novolog 4-6 units before supper   Current Orders: Semglee 8 units Daily      Novolog Moderate Correction Scale/ SSI (0-15 units) TID AC + HS      Tradjenta 5 mg daily    MD- HYPO again this AM  Please consider reducing the Semglee to 5 units Daily     --Will follow patient during hospitalization--  Ambrose Finland RN, MSN, CDCES Diabetes Coordinator Inpatient Glycemic Control Team Team Pager: [phone] (8a-5p)

## 2023-10-22 NOTE — Interval H&P Note (Signed)
History and Physical Interval Note:  10/22/2023 10:42 AM  Tiffany [name]  has presented today for surgery, with the diagnosis of End stage renal disease.  The various methods of treatment have been discussed with the patient and family. After consideration of risks, benefits and other options for treatment, the patient has consented to  Procedure(s): DIALYSIS/PERMA CATHETER INSERTION (Right) as a surgical intervention.  The patient's history has been reviewed, patient examined, no change in status, stable for surgery.  I have reviewed the patient's chart and labs.  Questions were answered to the patient's satisfaction.     Levora Dredge

## 2023-10-22 NOTE — Op Note (Signed)
OPERATIVE NOTE   PROCEDURE: Insertion of tunneled dialysis catheter right IJ approach with ultrasound and fluoroscopic guidance.  PRE-OPERATIVE DIAGNOSIS: Acute on chronic renal insufficiency requiring hemodialysis  POST-OPERATIVE DIAGNOSIS: Same  SURGEON: Levora Dredge.  ANESTHESIA: Conscious sedation was administered under my direct supervision by the interventional radiology RN. IV Versed plus fentanyl were utilized. Continuous ECG, pulse oximetry and blood pressure was monitored throughout the entire procedure. Conscious sedation was for a total of 21 minutes.  ESTIMATED BLOOD LOSS: Minimal cc  CONTRAST USED:  None  FLUOROSCOPY TIME: 0.1 minutes  INDICATIONS:   Tiffany [name] a 89 y.o. y.o. female who presents with acute on chronic renal insufficiency.  She is now going to initiate hemodialysis.  Therefore a tunnel catheter is indicated.  Risks and benefits were reviewed all questions answered patient agrees to proceed.  DESCRIPTION: After obtaining full informed written consent, the patient was positioned supine. The right neck and chest wall was prepped and draped in a sterile fashion. Ultrasound was placed in a sterile sleeve. Ultrasound was utilized to identify the right internal jugular vein which is noted to be echolucent and compressible indicating patency. Image is recorded for the permanent record. Under direct ultrasound visualization a micro-needle is inserted into the vein followed by the micro-wire. Micro-sheath was then advanced and a J wire is inserted without difficulty under fluoroscopic guidance. Small counterincision was made at the wire insertion site. Dilators are passed over the wire and the tunneled dialysis catheter is fed into the central venous system without difficulty.  Under fluoroscopy the catheter tip positioned at the atrial caval junction. The catheter is then approximated to the right chest wall and an exit site selected. 1% lidocaine is  infiltrated in soft tissues at this level small incision is made and the tunneling device is then passed from the exit site to the right neck counterincision. Catheter is then connected to the tunneling device and the catheter was pulled subcutaneously. It is then transected and the hub assembly connected without difficulty. Both lumens aspirate and flush easily. After verification of smooth contour with proper tip position under fluoroscopy the catheter is packed with 5000 units of heparin per lumen.  Catheter secured to the skin of the right chest wall with 0 silk. A sterile dressing is applied with a Biopatch.  COMPLICATIONS: None  CONDITION: Good  Levora Dredge [name]. Office:  [phone]   10/22/2023,11:26 AM

## 2023-10-22 NOTE — Progress Notes (Addendum)
[facility]  ROUNDING NOTE   Subjective:   Patient seen while awaiting vascular procedure Npo Husband at bedside  Furosemide drip at 5 mg/h Urine output recorded at 2.5 L  Creatinine 2.90  Objective:  Vital signs in last 24 hours:  Temp:  [98.1 F (36.7 C)-100.2 F (37.9 C)] 98.6 F (37 C) (10/25 1229) Pulse Rate:  [0-77] 67 (10/25 1400) Resp:  [18-33] 18 (10/25 1400) BP: (116-158)/(51-76) 135/53 (10/25 1400) SpO2:  [96 %-100 %] 97 % (10/25 1400) Weight:  [86.8 kg-90.3 kg] 86.8 kg (10/25 1234)  Weight change:  Filed Weights   10/22/23 0453 10/22/23 0923 10/22/23 1234  Weight: 90.3 kg 90.3 kg 86.8 kg    Intake/Output: I/O last 3 completed shifts: In: 325 [I.V.:84.8; IV Piggyback:240.2] Out: 3450 [Urine:3450]   Intake/Output this shift:  Total I/O In: -  Out: 1000 [Urine:1000]  Physical Exam: General: NAD  Head: Normocephalic, atraumatic. Moist oral mucosal membranes  Eyes: Anicteric  Lungs:  Clear to auscultation  Heart: Regular rate and rhythm  Abdomen:  Soft, nontender,   Extremities: 1+ peripheral edema.  Neurologic: Alert, moving all four extremities  Skin: BLE erythema       Basic Metabolic Panel: Recent Labs  Lab 10/19/23 0824 10/20/23 0334 10/21/23 0505 10/22/23 0353  NA 135 134* 136 138  K 4.2 4.3 3.6 3.5  CL 99 100 100 100  CO2 23 24 25 28   GLUCOSE 263* 67* 42* 47*  BUN 82* 84* 83* 75*  CREATININE 3.11* 3.22* 3.21* 2.90*  CALCIUM 8.9 8.4* 8.3* 8.4*    Liver Function Tests: No results for input(s): "AST", "ALT", "ALKPHOS", "BILITOT", "PROT", "ALBUMIN" in the last 168 hours. No results for input(s): "LIPASE", "AMYLASE" in the last 168 hours. No results for input(s): "AMMONIA" in the last 168 hours.  CBC: Recent Labs  Lab 10/19/23 0824 10/21/23 0505 10/22/23 0353  WBC 15.3* 14.0* 11.5*  NEUTROABS  --  12.5* 9.5*  HGB 12.8 9.7* 9.4*  HCT 40.8 29.7* 29.3*  MCV 85.9 83.2 84.4  PLT 253 166 163    Cardiac  Enzymes: Recent Labs  Lab 10/19/23 0827  CKTOTAL 16*    BNP: Invalid input(s): "POCBNP"  CBG: Recent Labs  Lab 10/22/23 0805 10/22/23 0852 10/22/23 0934 10/22/23 1031 10/22/23 1124  GLUCAP 63* 119* 96 78 185*    Microbiology: Results for orders placed or performed during the hospital encounter of 10/19/23  Blood culture (routine x 2)     Status: Abnormal   Collection Time: 10/19/23  9:10 AM   Specimen: BLOOD  Result Value Ref Range Status   Specimen Description   Final    BLOOD RIGHT WRIST Performed at [facility], [address] [place], [place] [address]    Special Requests   Final    BOTTLES DRAWN AEROBIC AND ANAEROBIC Blood Culture adequate volume Performed at [facility] [address] [place], [place] [address]    Culture  Setup Time   Final    Organism ID to follow GRAM NEGATIVE RODS ANAEROBIC BOTTLE ONLY CRITICAL RESULT CALLED TO, READ BACK BY AND VERIFIED WITH: JASON ROBBINS PHARMD @0000  10/20/23 ASW Performed at [facility], [address] [place], [place] [address]    Culture KLEBSIELLA OXYTOCA (A)  Final   Report Status 10/22/2023 FINAL  Final   Organism ID, Bacteria KLEBSIELLA OXYTOCA  Final      Susceptibility   Klebsiella oxytoca - MIC*    AMPICILLIN >=32 RESISTANT Resistant  CEFEPIME <=0.12 SENSITIVE Sensitive     CEFTAZIDIME <=1 SENSITIVE Sensitive     CEFTRIAXONE <=0.25 SENSITIVE Sensitive     CIPROFLOXACIN <=0.25 SENSITIVE Sensitive     GENTAMICIN <=1 SENSITIVE Sensitive     IMIPENEM <=0.25 SENSITIVE Sensitive     TRIMETH/SULFA <=20 SENSITIVE Sensitive     AMPICILLIN/SULBACTAM 8 SENSITIVE Sensitive     PIP/TAZO <=4 SENSITIVE Sensitive ug/mL    * KLEBSIELLA OXYTOCA  Blood Culture ID Panel (Reflexed)     Status: Abnormal   Collection Time: 10/19/23  9:10 AM  Result Value Ref Range Status   Enterococcus faecalis NOT DETECTED NOT DETECTED Final   Enterococcus Faecium NOT DETECTED NOT DETECTED Final    Listeria monocytogenes NOT DETECTED NOT DETECTED Final   Staphylococcus species NOT DETECTED NOT DETECTED Final   Staphylococcus aureus (BCID) NOT DETECTED NOT DETECTED Final   Staphylococcus epidermidis NOT DETECTED NOT DETECTED Final   Staphylococcus lugdunensis NOT DETECTED NOT DETECTED Final   Streptococcus species NOT DETECTED NOT DETECTED Final   Streptococcus agalactiae NOT DETECTED NOT DETECTED Final   Streptococcus pneumoniae NOT DETECTED NOT DETECTED Final   Streptococcus pyogenes NOT DETECTED NOT DETECTED Final   A.calcoaceticus-baumannii NOT DETECTED NOT DETECTED Final   Bacteroides fragilis NOT DETECTED NOT DETECTED Final   Enterobacterales DETECTED (A) NOT DETECTED Final    Comment: Enterobacterales represent a large order of gram negative bacteria, not a single organism. CRITICAL RESULT CALLED TO, READ BACK BY AND VERIFIED WITH: JASON ROBBINS PHARMD @0000  10/20/23 ASW    Enterobacter cloacae complex NOT DETECTED NOT DETECTED Final   Escherichia coli NOT DETECTED NOT DETECTED Final   Klebsiella aerogenes NOT DETECTED NOT DETECTED Final   Klebsiella oxytoca DETECTED (A) NOT DETECTED Final    Comment: CRITICAL RESULT CALLED TO, READ BACK BY AND VERIFIED WITH: JASON ROBBINS PHARMD @0000  10/20/23 ASW    Klebsiella pneumoniae NOT DETECTED NOT DETECTED Final   Proteus species NOT DETECTED NOT DETECTED Final   Salmonella species NOT DETECTED NOT DETECTED Final   Serratia marcescens NOT DETECTED NOT DETECTED Final   Haemophilus influenzae NOT DETECTED NOT DETECTED Final   Neisseria meningitidis NOT DETECTED NOT DETECTED Final   Pseudomonas aeruginosa NOT DETECTED NOT DETECTED Final   Stenotrophomonas maltophilia NOT DETECTED NOT DETECTED Final   Candida albicans NOT DETECTED NOT DETECTED Final   Candida auris NOT DETECTED NOT DETECTED Final   Candida glabrata NOT DETECTED NOT DETECTED Final   Candida krusei NOT DETECTED NOT DETECTED Final   Candida parapsilosis NOT  DETECTED NOT DETECTED Final   Candida tropicalis NOT DETECTED NOT DETECTED Final   Cryptococcus neoformans/gattii NOT DETECTED NOT DETECTED Final   CTX-M ESBL NOT DETECTED NOT DETECTED Final   Carbapenem resistance IMP NOT DETECTED NOT DETECTED Final   Carbapenem resistance KPC NOT DETECTED NOT DETECTED Final   Carbapenem resistance NDM NOT DETECTED NOT DETECTED Final   Carbapenem resist OXA 48 LIKE NOT DETECTED NOT DETECTED Final   Carbapenem resistance VIM NOT DETECTED NOT DETECTED Final    Comment: Performed at [facility], [address] [place], [place] [address]  Blood culture (routine x 2)     Status: None (Preliminary result)   Collection Time: 10/19/23 11:45 AM   Specimen: BLOOD  Result Value Ref Range Status   Specimen Description BLOOD LEFT ANTECUBITAL  Final   Special Requests   Final    BOTTLES DRAWN AEROBIC AND ANAEROBIC Blood Culture adequate volume   Culture   Final  NO GROWTH 3 DAYS Performed at [facility], [address] [place], [place] [address]    Report Status PENDING  Incomplete  Group A Strep by PCR     Status: None   Collection Time: 10/19/23  4:15 PM   Specimen: Throat; Sterile Swab  Result Value Ref Range Status   Group A Strep by PCR NOT DETECTED NOT DETECTED Final    Comment: Performed at [facility], [address] [place], [place] [address]  MRSA Next Gen by PCR, Nasal     Status: Abnormal   Collection Time: 10/21/23  6:15 PM   Specimen: Nasal Mucosa; Nasal Swab  Result Value Ref Range Status   MRSA by PCR Next Gen DETECTED (A) NOT DETECTED Final    Comment: RESULT CALLED TO, READ BACK BY AND VERIFIED WITH: YASMINE SORIANO @1935  ON 10/21/23 SKL (NOTE) The GeneXpert MRSA Assay (FDA approved for NASAL specimens only), is one component of a comprehensive MRSA colonization surveillance program. It is not intended to diagnose MRSA infection nor to guide or monitor treatment for MRSA infections. Test performance  is not FDA approved in patients less than 23 years old. Performed at [facility], [address] [place], [place] [address]     Coagulation Studies: No results for input(s): "LABPROT", "INR" in the last 72 hours.  Urinalysis: No results for input(s): "COLORURINE", "LABSPEC", "PHURINE", "GLUCOSEU", "HGBUR", "BILIRUBINUR", "KETONESUR", "PROTEINUR", "UROBILINOGEN", "NITRITE", "LEUKOCYTESUR" in the last 72 hours.  Invalid input(s): "APPERANCEUR"    Imaging: PERIPHERAL VASCULAR CATHETERIZATION  Result Date: 10/22/2023 See surgical note for result.    Medications:    cefTRIAXone (ROCEPHIN)  IV 2 g (10/21/23 1021)   furosemide (LASIX) 200 mg in dextrose 5 % 100 mL (2 mg/mL) infusion 5 mg/hr (10/21/23 0334)    [START ON 10/23/2023] (feeding supplement) PROSource Plus  30 mL Oral TID BM   allopurinol  50 mg Oral Daily   amiodarone  200 mg Oral Daily   Chlorhexidine Gluconate Cloth  6 each Topical Q0600   Chlorhexidine Gluconate Cloth  6 each Topical Q0600   insulin aspart  0-15 Units Subcutaneous TID WC   insulin aspart  0-5 Units Subcutaneous QHS   insulin glargine-yfgn  5 Units Subcutaneous Daily   linagliptin  5 mg Oral Daily   metoprolol succinate  12.5 mg Oral BID   [START ON 10/23/2023] multivitamin  1 tablet Oral QHS   mupirocin ointment  1 Application Nasal BID   Rivaroxaban  15 mg Oral Q supper   sodium chloride flush  10 mL Intravenous Q12H   sodium chloride flush  3 mL Intravenous Q12H   acetaminophen, alteplase, heparin, HYDROcodone-acetaminophen, ondansetron (ZOFRAN) IV, polyethylene glycol, sodium chloride flush  Assessment/ Plan:  Ms. Aydin Sefton is a 89 y.o.  female with past medical history including chronic heart failure, A-fib with recent cardioversion on Xarelto, CAD, and chronic kidney disease stage IV-5, who was admitted to [facility] on 10/19/2023 for CHF (congestive heart failure) (HCC) [I50.9] Bilateral leg pain [M79.604, M79.605] AKI (acute  kidney injury) (HCC) [N17.9] Cellulitis, unspecified cellulitis site [L03.90]   End stage renal disease requiring hemodialysis. We feel with progressive renal function and failed outpatient diuretic therapy, patient is now deemed end stage renal disease.   Creatinine slightly improved today with adequate urine output. Agreeable to proceed with tunneled catheter placement to imitate renal replacement therapy. Appreciate vascular placing permcath. Patient will receive first dialysis treatment after placement and second treatment tomorrow.  Renal navigator  has begun outpatient dialysis clinic search.   Lab Results  Component Value Date   CREATININE 2.90 (H) 10/22/2023   CREATININE 3.21 (H) 10/21/2023   CREATININE 3.22 (H) 10/20/2023    Intake/Output Summary (Last 24 hours) at 10/22/2023 1416 Last data filed at 10/22/2023 1000 Gross per 24 hour  Intake --  Output 3500 ml  Net -3500 ml   2.  Acute on chronic combined heart failure.  Last echo on 11/06/2021 shows EF 30 to 35% with a grade 2 diastolic dysfunction, moderate mitral valve regurgitation.    Will stop IV furosemide drip and manage fluid volume with dialysis. Will assess need for oral diuretics at later time.    3. Diabetes mellitus type II with chronic kidney disease/renal manifestations: insulin dependent. Home regimen includes Lantus, NovoLog and glimepiride. Most recent hemoglobin A1c is 7.4 on 08/27/22.   Glucose well controlled. Primary team to manage sliding scale insulin.   LOS: 3 Cartier Washko 10/25/20242:16 PM

## 2023-10-23 ENCOUNTER — Inpatient Hospital Stay
Admit: 2023-10-23 | Discharge: 2023-10-23 | Disposition: A | Discharge status: Home / Self Care | Payer: PPO | Attending: Internal Medicine

## 2023-10-23 DIAGNOSIS — N179 Acute kidney failure, unspecified: Secondary | ICD-10-CM | POA: Diagnosis not present

## 2023-10-23 DIAGNOSIS — E0800 Diabetes mellitus due to underlying condition with hyperosmolarity without nonketotic hyperglycemic-hyperosmolar coma (NKHHC): Secondary | ICD-10-CM | POA: Diagnosis not present

## 2023-10-23 DIAGNOSIS — M79604 Pain in right leg: Secondary | ICD-10-CM | POA: Diagnosis not present

## 2023-10-23 DIAGNOSIS — L0390 Cellulitis, unspecified: Secondary | ICD-10-CM | POA: Diagnosis not present

## 2023-10-23 LAB — CBC WITH DIFFERENTIAL/PLATELET
Abs Immature Granulocytes: 0.1 10*3/uL — ABNORMAL HIGH (ref 0.00–0.07)
Basophils Absolute: 0.1 10*3/uL (ref 0.0–0.1)
Basophils Relative: 1 %
Eosinophils Absolute: 0.2 10*3/uL (ref 0.0–0.5)
Eosinophils Relative: 2 %
HCT: 33.9 % — ABNORMAL LOW (ref 36.0–46.0)
Hemoglobin: 10.5 g/dL — ABNORMAL LOW (ref 12.0–15.0)
Immature Granulocytes: 1 %
Lymphocytes Relative: 9 %
Lymphs Abs: 0.9 10*3/uL (ref 0.7–4.0)
MCH: 26.9 pg (ref 26.0–34.0)
MCHC: 31 g/dL (ref 30.0–36.0)
MCV: 86.7 fL (ref 80.0–100.0)
Monocytes Absolute: 0.8 10*3/uL (ref 0.1–1.0)
Monocytes Relative: 8 %
Neutro Abs: 8 10*3/uL — ABNORMAL HIGH (ref 1.7–7.7)
Neutrophils Relative %: 79 %
Platelets: 169 10*3/uL (ref 150–400)
RBC: 3.91 MIL/uL (ref 3.87–5.11)
RDW: 16.1 % — ABNORMAL HIGH (ref 11.5–15.5)
WBC: 9.9 10*3/uL (ref 4.0–10.5)
nRBC: 0 % (ref 0.0–0.2)

## 2023-10-23 LAB — BASIC METABOLIC PANEL
Anion gap: 13 (ref 5–15)
BUN: 50 mg/dL — ABNORMAL HIGH (ref 8–23)
CO2: 26 mmol/L (ref 22–32)
Calcium: 8.1 mg/dL — ABNORMAL LOW (ref 8.9–10.3)
Chloride: 96 mmol/L — ABNORMAL LOW (ref 98–111)
Creatinine, Ser: 2.31 mg/dL — ABNORMAL HIGH (ref 0.44–1.00)
GFR, Estimated: 20 mL/min — ABNORMAL LOW (ref >60)
Glucose, Bld: 275 mg/dL — ABNORMAL HIGH (ref 70–99)
Potassium: 3.6 mmol/L (ref 3.5–5.1)
Sodium: 135 mmol/L (ref 135–145)

## 2023-10-23 LAB — GLUCOSE, CAPILLARY
Glucose-Capillary: 240 mg/dL — ABNORMAL HIGH (ref 70–99)
Glucose-Capillary: 163 mg/dL — ABNORMAL HIGH (ref 70–99)
Glucose-Capillary: 127 mg/dL — ABNORMAL HIGH (ref 70–99)
Glucose-Capillary: 177 mg/dL — ABNORMAL HIGH (ref 70–99)

## 2023-10-23 NOTE — Progress Notes (Signed)
Received patient in bed to unit.    Informed consent signed and in chart.    TX duration: 2.5 hrs     Transported back to floor  Hand-off given to patient's nurse.   Access used:  Rt chest cvc Access issues: n/a Dressing changed   Total UF removed: 1000 mls Post HD weight: 85.6kg      Maple Hudson, RN Dialysis Unit

## 2023-10-23 NOTE — Progress Notes (Signed)
[facility]  ROUNDING NOTE   Subjective:   Patient seen and evaluated during dialysis   HEMODIALYSIS FLOWSHEET:  Blood Flow Rate (mL/min): 249 mL/min Arterial Pressure (mmHg): -83.23 mmHg Venous Pressure (mmHg): 84.44 mmHg TMP (mmHg): 17.57 mmHg Ultrafiltration Rate (mL/min): 495 mL/min Dialysate Flow Rate (mL/min): 299 ml/min  Tolerating second treatment well Denies pain Tolerating small meals   Objective:  Vital signs in last 24 hours:  Temp:  [97.6 F (36.4 C)-98.9 F (37.2 C)] 98 F (36.7 C) (10/26 1039) Pulse Rate:  [62-88] 78 (10/26 1039) Resp:  [15-29] 28 (10/26 1108) BP: (107-145)/(51-75) 126/59 (10/26 1039) SpO2:  [97 %-100 %] 100 % (10/26 1039) Weight:  [85.6 kg-87 kg] 85.6 kg (10/26 1039)  Weight change: 0 kg Filed Weights   10/23/23 0500 10/23/23 0753 10/23/23 1039  Weight: 87 kg 87 kg 85.6 kg    Intake/Output: I/O last 3 completed shifts: In: 120 [P.O.:120] Out: 5250 [Urine:5250]   Intake/Output this shift:  Total I/O In: -  Out: 1000 [Other:1000]  Physical Exam: General: NAD  Head: Normocephalic, atraumatic. Moist oral mucosal membranes  Eyes: Anicteric  Lungs:  Clear to auscultation  Heart: Regular rate and rhythm  Abdomen:  Soft, nontender,   Extremities: 1+ peripheral edema.  Neurologic: Alert, moving all four extremities  Skin: BLE erythema  ACCESS Rt Chest permcath placed on 10/22/23    Basic Metabolic Panel: Recent Labs  Lab 10/19/23 0824 10/20/23 0334 10/21/23 0505 10/22/23 0353 10/23/23 0338  NA 135 134* 136 138 135  K 4.2 4.3 3.6 3.5 3.6  CL 99 100 100 100 96*  CO2 23 24 25 28 26   GLUCOSE 263* 67* 42* 47* 275*  BUN 82* 84* 83* 75* 50*  CREATININE 3.11* 3.22* 3.21* 2.90* 2.31*  CALCIUM 8.9 8.4* 8.3* 8.4* 8.1*    Liver Function Tests: No results for input(s): "AST", "ALT", "ALKPHOS", "BILITOT", "PROT", "ALBUMIN" in the last 168 hours. No results for input(s): "LIPASE", "AMYLASE" in the last 168  hours. No results for input(s): "AMMONIA" in the last 168 hours.  CBC: Recent Labs  Lab 10/19/23 0824 10/21/23 0505 10/22/23 0353 10/23/23 0338  WBC 15.3* 14.0* 11.5* 9.9  NEUTROABS  --  12.5* 9.5* 8.0*  HGB 12.8 9.7* 9.4* 10.5*  HCT 40.8 29.7* 29.3* 33.9*  MCV 85.9 83.2 84.4 86.7  PLT 253 166 163 169    Cardiac Enzymes: Recent Labs  Lab 10/19/23 0827  CKTOTAL 16*    BNP: Invalid input(s): "POCBNP"  CBG: Recent Labs  Lab 10/22/23 1031 10/22/23 1124 10/22/23 1542 10/22/23 2048 10/23/23 0408  GLUCAP 78 185* 105* 187* 240*    Microbiology: Results for orders placed or performed during the hospital encounter of 10/19/23  Blood culture (routine x 2)     Status: Abnormal   Collection Time: 10/19/23  9:10 AM   Specimen: BLOOD  Result Value Ref Range Status   Specimen Description   Final    BLOOD RIGHT WRIST Performed at [facility], [address] [place], [place] [address]    Special Requests   Final    BOTTLES DRAWN AEROBIC AND ANAEROBIC Blood Culture adequate volume Performed at [facility], [address] [place], [place] [address]    Culture  Setup Time   Final    Organism ID to follow GRAM NEGATIVE RODS ANAEROBIC BOTTLE ONLY CRITICAL RESULT CALLED TO, READ BACK BY AND VERIFIED WITH: JASON ROBBINS PHARMD @0000  10/20/23 ASW Performed at [facility], [address] [place], [place] [address]    Culture KLEBSIELLA OXYTOCA (A)  Final   Report Status 10/22/2023 FINAL  Final   Organism ID, Bacteria KLEBSIELLA OXYTOCA  Final      Susceptibility   Klebsiella oxytoca - MIC*    AMPICILLIN >=32 RESISTANT Resistant     CEFEPIME <=0.12 SENSITIVE Sensitive     CEFTAZIDIME <=1 SENSITIVE Sensitive     CEFTRIAXONE <=0.25 SENSITIVE Sensitive     CIPROFLOXACIN <=0.25 SENSITIVE Sensitive     GENTAMICIN <=1 SENSITIVE Sensitive     IMIPENEM <=0.25 SENSITIVE Sensitive     TRIMETH/SULFA <=20 SENSITIVE Sensitive      AMPICILLIN/SULBACTAM 8 SENSITIVE Sensitive     PIP/TAZO <=4 SENSITIVE Sensitive ug/mL    * KLEBSIELLA OXYTOCA  Blood Culture ID Panel (Reflexed)     Status: Abnormal   Collection Time: 10/19/23  9:10 AM  Result Value Ref Range Status   Enterococcus faecalis NOT DETECTED NOT DETECTED Final   Enterococcus Faecium NOT DETECTED NOT DETECTED Final   Listeria monocytogenes NOT DETECTED NOT DETECTED Final   Staphylococcus species NOT DETECTED NOT DETECTED Final   Staphylococcus aureus (BCID) NOT DETECTED NOT DETECTED Final   Staphylococcus epidermidis NOT DETECTED NOT DETECTED Final   Staphylococcus lugdunensis NOT DETECTED NOT DETECTED Final   Streptococcus species NOT DETECTED NOT DETECTED Final   Streptococcus agalactiae NOT DETECTED NOT DETECTED Final   Streptococcus pneumoniae NOT DETECTED NOT DETECTED Final   Streptococcus pyogenes NOT DETECTED NOT DETECTED Final   A.calcoaceticus-baumannii NOT DETECTED NOT DETECTED Final   Bacteroides fragilis NOT DETECTED NOT DETECTED Final   Enterobacterales DETECTED (A) NOT DETECTED Final    Comment: Enterobacterales represent a large order of gram negative bacteria, not a single organism. CRITICAL RESULT CALLED TO, READ BACK BY AND VERIFIED WITH: JASON ROBBINS PHARMD @0000  10/20/23 ASW    Enterobacter cloacae complex NOT DETECTED NOT DETECTED Final   Escherichia coli NOT DETECTED NOT DETECTED Final   Klebsiella aerogenes NOT DETECTED NOT DETECTED Final   Klebsiella oxytoca DETECTED (A) NOT DETECTED Final    Comment: CRITICAL RESULT CALLED TO, READ BACK BY AND VERIFIED WITH: JASON ROBBINS PHARMD @0000  10/20/23 ASW    Klebsiella pneumoniae NOT DETECTED NOT DETECTED Final   Proteus species NOT DETECTED NOT DETECTED Final   Salmonella species NOT DETECTED NOT DETECTED Final   Serratia marcescens NOT DETECTED NOT DETECTED Final   Haemophilus influenzae NOT DETECTED NOT DETECTED Final   Neisseria meningitidis NOT DETECTED NOT DETECTED Final    Pseudomonas aeruginosa NOT DETECTED NOT DETECTED Final   Stenotrophomonas maltophilia NOT DETECTED NOT DETECTED Final   Candida albicans NOT DETECTED NOT DETECTED Final   Candida auris NOT DETECTED NOT DETECTED Final   Candida glabrata NOT DETECTED NOT DETECTED Final   Candida krusei NOT DETECTED NOT DETECTED Final   Candida parapsilosis NOT DETECTED NOT DETECTED Final   Candida tropicalis NOT DETECTED NOT DETECTED Final   Cryptococcus neoformans/gattii NOT DETECTED NOT DETECTED Final   CTX-M ESBL NOT DETECTED NOT DETECTED Final   Carbapenem resistance IMP NOT DETECTED NOT DETECTED Final   Carbapenem resistance KPC NOT DETECTED NOT DETECTED Final   Carbapenem resistance NDM NOT DETECTED NOT DETECTED Final   Carbapenem resist OXA 48 LIKE NOT DETECTED NOT DETECTED Final   Carbapenem resistance VIM NOT DETECTED NOT DETECTED Final    Comment: Performed at [facility], [address] [place], [place] [address]  Blood culture (routine x 2)     Status: None (Preliminary result)  Collection Time: 10/19/23 11:45 AM   Specimen: BLOOD  Result Value Ref Range Status   Specimen Description BLOOD LEFT ANTECUBITAL  Final   Special Requests   Final    BOTTLES DRAWN AEROBIC AND ANAEROBIC Blood Culture adequate volume   Culture   Final    NO GROWTH 4 DAYS Performed at [facility], [address], [place], [place] [address]    Report Status PENDING  Incomplete  Group A Strep by PCR     Status: None   Collection Time: 10/19/23  4:15 PM   Specimen: Throat; Sterile Swab  Result Value Ref Range Status   Group A Strep by PCR NOT DETECTED NOT DETECTED Final    Comment: Performed at [facility], [address] [place], [place] [address]  MRSA Next Gen by PCR, Nasal     Status: Abnormal   Collection Time: 10/21/23  6:15 PM   Specimen: Nasal Mucosa; Nasal Swab  Result Value Ref Range Status   MRSA by PCR Next Gen DETECTED (A) NOT DETECTED Final    Comment: RESULT CALLED  TO, READ BACK BY AND VERIFIED WITH: YASMINE SORIANO @1935  ON 10/21/23 SKL (NOTE) The GeneXpert MRSA Assay (FDA approved for NASAL specimens only), is one component of a comprehensive MRSA colonization surveillance program. It is not intended to diagnose MRSA infection nor to guide or monitor treatment for MRSA infections. Test performance is not FDA approved in patients less than 70 years old. Performed at [facility], [address] [place], [place] [address]     Coagulation Studies: No results for input(s): "LABPROT", "INR" in the last 72 hours.  Urinalysis: No results for input(s): "COLORURINE", "LABSPEC", "PHURINE", "GLUCOSEU", "HGBUR", "BILIRUBINUR", "KETONESUR", "PROTEINUR", "UROBILINOGEN", "NITRITE", "LEUKOCYTESUR" in the last 72 hours.  Invalid input(s): "APPERANCEUR"    Imaging: MR ABDOMEN W WO CONTRAST  Result Date: 10/22/2023 CLINICAL DATA:  Characterize left renal mass identified by prior ultrasound EXAM: MRI ABDOMEN WITHOUT AND WITH CONTRAST TECHNIQUE: Multiplanar multisequence MR imaging of the abdomen was performed both before and after the administration of intravenous contrast. CONTRAST:  7.66mL GADAVIST GADOBUTROL 1 MMOL/ML IV SOLN COMPARISON:  Renal ultrasound, 10/19/2023, CT abdomen pelvis, 11/05/2021 FINDINGS: Examination is generally limited by breath motion artifact, particularly multiphasic contrast enhanced sequences. Lower chest: No acute abnormality. Hepatobiliary: No solid liver abnormality is seen. No gallstones, gallbladder wall thickening, or biliary dilatation. Pancreas: Severely atrophic pancreas. Numerous fluid signal cystic lesions scattered throughout the pancreas, largest in the pancreatic neck measuring 1.2 x 0.9 cm (series 4, image 20). Appearance not obviously changed compared to prior noncontrast examination dated 11/05/2021. No solid component or suspicious contrast enhancement. No acute inflammatory findings. Spleen: Normal in size  without significant abnormality. Adrenals/Urinary Tract: Adrenal glands are unremarkable. Arterially enhancing exophytic mass arising from the peripheral inferior pole of the left kidney measuring 1.7 x 1.4 cm (series 15, image 63). Additional benign fluid signal renal cortical and parapelvic cysts, for which no specific further follow-up or characterization is required. No obvious calculi or hydronephrosis. Stomach/Bowel: Stomach is within normal limits. No evidence of bowel wall thickening, distention, or inflammatory changes. Vascular/Lymphatic: Aortic atherosclerosis. No enlarged abdominal lymph nodes. Other: No abdominal wall hernia or abnormality. No ascites. Musculoskeletal: No acute or significant osseous findings. IMPRESSION: 1. Examination is generally limited by breath motion artifact, particularly multiphasic contrast enhanced sequences. 2. Within this limitation, arterially enhancing exophytic mass arising from the peripheral inferior pole of the left kidney measuring 1.7 x 1.4 cm, consistent with  a small renal cell carcinoma. 3. No evidence of obvious renal vein invasion, lymphadenopathy, or metastatic disease in the abdomen. 4. Numerous fluid signal cystic lesions scattered throughout the pancreas, largest in the pancreatic neck measuring 1.2 x 0.9 cm. Appearance not obviously changed compared to prior noncontrast examination dated 11/05/2021. No solid component or suspicious contrast enhancement. 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 As there is no observed increased risk of malignancy for such lesions smaller than 2 cm, especially given evidence of other primary malignancy, initially established imaging stability and advanced patient age, no specific further follow-up or characterization is required. Aortic Atherosclerosis (ICD10-I70.0). Electronically Signed   By: Jearld Lesch M.D.   On: 10/22/2023 21:35   PERIPHERAL VASCULAR CATHETERIZATION  Result Date: 10/22/2023 See surgical note  for result.    Medications:    cefTRIAXone (ROCEPHIN)  IV 2 g (10/21/23 1021)    (feeding supplement) PROSource Plus  30 mL Oral TID BM   allopurinol  50 mg Oral Daily   amiodarone  200 mg Oral Daily   Chlorhexidine Gluconate Cloth  6 each Topical Q0600   Chlorhexidine Gluconate Cloth  6 each Topical Q0600   insulin aspart  0-15 Units Subcutaneous TID WC   insulin aspart  0-5 Units Subcutaneous QHS   insulin glargine-yfgn  5 Units Subcutaneous Daily   linagliptin  5 mg Oral Daily   metoprolol succinate  12.5 mg Oral BID   multivitamin  1 tablet Oral QHS   mupirocin ointment  1 Application Nasal BID   Rivaroxaban  15 mg Oral Q supper   sodium chloride flush  10 mL Intravenous Q12H   sodium chloride flush  3 mL Intravenous Q12H   acetaminophen, HYDROcodone-acetaminophen, ondansetron (ZOFRAN) IV, polyethylene glycol, sodium chloride flush  Assessment/ Plan:  [name] is a 89 y.o.  female with past medical history including chronic heart failure, A-fib with recent cardioversion on Xarelto, CAD, and chronic kidney disease stage IV-5, who was admitted to [facility] on 10/19/2023 for CHF (congestive heart failure) (HCC) [I50.9] Bilateral leg pain [M79.604, M79.605] AKI (acute kidney injury) (HCC) [N17.9] Cellulitis, unspecified cellulitis site [L03.90]   End stage renal disease requiring hemodialysis. We feel with progressive renal function and failed outpatient diuretic therapy, patient is now deemed end stage renal disease.   Appreciate vascular surgery placing rt chest permcath on 10/22/23. Initial dialysis treatment received after placement, tolerated well. Received second treatment today, UF 1L achieved. Next treatment scheduled for Monday, seated in chair to prepare for outpatient clinic. Renal navigator seeking outpatient dialysis clinic.   Lab Results  Component Value Date   CREATININE 2.31 (H) 10/23/2023   CREATININE 2.90 (H) 10/22/2023   CREATININE 3.21 (H) 10/21/2023     Intake/Output Summary (Last 24 hours) at 10/23/2023 1135 Last data filed at 10/23/2023 1039 Gross per 24 hour  Intake 120 ml  Output 2750 ml  Net -2630 ml   2.  Acute on chronic combined heart failure.  Last echo on 11/06/2021 shows EF 30 to 35% with a grade 2 diastolic dysfunction, moderate mitral valve regurgitation.    Will manage fluid volume with dialysis and consider oral diuretics.    3. Diabetes mellitus type II with chronic kidney disease/renal manifestations: insulin dependent. Home regimen includes Lantus, NovoLog and glimepiride. Most recent hemoglobin A1c is 7.4 on 08/27/22.   Primary team to manage sliding scale insulin.   LOS: 4 Tiffany [name] 10/26/202411:35 AM

## 2023-10-23 NOTE — Plan of Care (Signed)
  Problem: Coping: Goal: Ability to adjust to condition or change in health will improve Outcome: Progressing   Problem: Fluid Volume: Goal: Ability to maintain a balanced intake and output will improve Outcome: Progressing

## 2023-10-23 NOTE — Progress Notes (Signed)
Progress Note   Patient: Tiffany [name] YQI:[id number] DOB: 1933-08-05 DOA: 10/19/2023     4 DOS: the patient was seen and examined on 10/23/2023     Subjective:  Patient seen and examined this morning Denies nausea vomiting abdominal pain or chest pain I discussed with her the findings of her MRI of the abdomen reports   Brief hospital course: From HPI "Tiffany [name] is a 89 y.o. female with medical history significant of chronic HFrEF with LVEF 30-35%, CAD, CKD stage IV, PAF status post recent cardioversion on Xarelto, recent obstructive labs at ureteral stone status post stenting April 20204,chronic back pain, IDDM with insulin resistance, obesity, presented with worsening of right leg rash and pain. Patient underwent cardioversion earlier this year, this time she denied any palpitations.  She has been taking torsemide 20 mg every day but does not see a significant improvement of her leg swelling. ED Course: Afebrile, nontachycardic blood pressure 109/61 O2 saturation 100% on room air.  WBC 15.3, hemoglobin 12.8, K4.2, creatinine 3.1 compared to 1.82 months ago.  Glucose 263.  "   Assessment and Plan:   Acute on chronic HFrEF decompensation -Anasarca -Strongly suspect cardiorenal syndrome, given the significant status of fluid overload. Diuresis according to nephrologist recommendation Follow-up on echocardiogram Monitor renal function closely    Left renal cell carcinoma Around the abdomen obtained showing findings of left renal exophytic mass concerning for renal cell carcinoma I have discussed the case with urology Dr. Cardell Peach who recommends outpatient follow-up  AKI on CKD -Suspect cardiorenal syndrome as patient is significantly fluid overloaded, management as above Uric acid elevated and patient currently on allopurinol Plan of care discussed with nephrologist  Have agreed with nephrology for dialysis Vascular surgeon has placed permacath for hemodialysis on 10/22/2023    Recurrent right leg cellulitis -Check MRSA and strep a PCR Continue Monitor blood culture results -Currently there is no signs of MRSA infection   Elevated lactic acid -With fluid overload, suspect cardiorenal syndrome and intravascular depletion.   PAF -Status post cardioversion in April of this year.  Patient denied any palpitations.  At this point unclear what causes her CHF decompensation.  Continue   IDDM with hyperglycemia, with insulin resistance as well as hypoglycemia Insulin adjusted for hypoglycemia Monitor glucose closely   Recent left-sided hydronephrosis secondary to obstructing ureteral stone Left kidney mass -No urinary symptoms -Renal ultrasound report as shown   Urinary retention Continue Foley catheterization   Chronic LBBB -With low LVEF, consider CRT. Outpatient cardiology follow up.   DVT prophylaxis: Xarelto Code Status: Full code   consults called: Nephrology Admission status: Telemetry admission     Physical Exam: Eyes: PERRL, lids and conjunctivae normal ENMT: Mucous membranes are moist. Posterior pharynx clear of any exudate or lesions.Normal dentition.  Neck: normal, supple, no masses, no thyromegaly Respiratory: clear to auscultation bilaterally Cardiovascular: Regular rate and rhythm, no murmurs / rubs / gallops.  Abdomen: no tenderness, no masses palpated.  Musculoskeletal: no clubbing / cyanosis.  Skin: Erythema to the right lower extremity is improving Neurologic: CN 2-12 grossly intact. Sensation intact, DTR normal. Strength 5/5 in all 4.  Psychiatric: Normal judgment and insight. Alert and oriented x 3. Normal mood.        Data Reviewed: Reviewed patient's lab reports and vitals as shown below I have also reviewed nephrology documentation as well as nursing documentation   Family Communication: Discussed with husband present at bedside   Time spent: 41 minutes    Latest  Ref Rng & Units 10/23/2023    3:38 AM 10/22/2023     3:53 AM 10/21/2023    5:05 AM  CBC  WBC 4.0 - 10.5 K/uL 9.9  11.5  14.0   Hemoglobin 12.0 - 15.0 g/dL 34.7  9.4  9.7   Hematocrit 36.0 - 46.0 % 33.9  29.3  29.7   Platelets 150 - 400 K/uL 169  163  166        Latest Ref Rng & Units 10/23/2023    3:38 AM 10/22/2023    3:53 AM 10/21/2023    5:05 AM  BMP  Glucose 70 - 99 mg/dL 425  47  42   BUN 8 - 23 mg/dL 50  75  83   Creatinine 0.44 - 1.00 mg/dL 9.56  3.87  5.64   Sodium 135 - 145 mmol/L 135  138  136   Potassium 3.5 - 5.1 mmol/L 3.6  3.5  3.6   Chloride 98 - 111 mmol/L 96  100  100   CO2 22 - 32 mmol/L 26  28  25    Calcium 8.9 - 10.3 mg/dL 8.1  8.4  8.3      Vitals:   10/23/23 1030 10/23/23 1039 10/23/23 1108 10/23/23 1339  BP: 111/63 (!) 126/59  (!) 110/51  Pulse: 78 78  75  Resp: (!) 22 (!) 29 (!) 28 18  Temp:  98 F (36.7 C)  98.1 F (36.7 C)  TempSrc:  Oral    SpO2: 99% 100%  97%  Weight:  85.6 kg    Height:         Author: Loyce Dys, MD 10/23/2023 3:36 PM  For on call review [email or web address].

## 2023-10-23 NOTE — Progress Notes (Signed)
Patient's dressings changed per order.  Patient had minimal weeping to bilateral lower extremities. Xeroform placed on weeping areas and covered with foam. Legs wrapped with kerlix and ace wrap. Patient tolerated dressing change with minimal discomfort.

## 2023-10-24 ENCOUNTER — Inpatient Hospital Stay (HOSPITAL_COMMUNITY)
Admit: 2023-10-24 | Discharge: 2023-10-24 | Disposition: A | Discharge status: Home / Self Care | Payer: PPO | Attending: Internal Medicine

## 2023-10-24 DIAGNOSIS — I5021 Acute systolic (congestive) heart failure: Secondary | ICD-10-CM | POA: Diagnosis not present

## 2023-10-24 DIAGNOSIS — E0800 Diabetes mellitus due to underlying condition with hyperosmolarity without nonketotic hyperglycemic-hyperosmolar coma (NKHHC): Secondary | ICD-10-CM | POA: Diagnosis not present

## 2023-10-24 DIAGNOSIS — L0390 Cellulitis, unspecified: Secondary | ICD-10-CM | POA: Diagnosis not present

## 2023-10-24 DIAGNOSIS — M79604 Pain in right leg: Secondary | ICD-10-CM | POA: Diagnosis not present

## 2023-10-24 DIAGNOSIS — N179 Acute kidney failure, unspecified: Secondary | ICD-10-CM | POA: Diagnosis not present

## 2023-10-24 LAB — CBC WITH DIFFERENTIAL/PLATELET
Abs Immature Granulocytes: 0.09 10*3/uL — ABNORMAL HIGH (ref 0.00–0.07)
Basophils Absolute: 0 10*3/uL (ref 0.0–0.1)
Basophils Relative: 0 %
Eosinophils Absolute: 0.2 10*3/uL (ref 0.0–0.5)
Eosinophils Relative: 2 %
HCT: 30.7 % — ABNORMAL LOW (ref 36.0–46.0)
Hemoglobin: 9.5 g/dL — ABNORMAL LOW (ref 12.0–15.0)
Immature Granulocytes: 1 %
Lymphocytes Relative: 19 %
Lymphs Abs: 1.6 10*3/uL (ref 0.7–4.0)
MCH: 26.7 pg (ref 26.0–34.0)
MCHC: 30.9 g/dL (ref 30.0–36.0)
MCV: 86.2 fL (ref 80.0–100.0)
Monocytes Absolute: 0.9 10*3/uL (ref 0.1–1.0)
Monocytes Relative: 11 %
Neutro Abs: 5.8 10*3/uL (ref 1.7–7.7)
Neutrophils Relative %: 67 %
Platelets: 167 10*3/uL (ref 150–400)
RBC: 3.56 MIL/uL — ABNORMAL LOW (ref 3.87–5.11)
RDW: 15.5 % (ref 11.5–15.5)
WBC: 8.6 10*3/uL (ref 4.0–10.5)
nRBC: 0 % (ref 0.0–0.2)

## 2023-10-24 LAB — BASIC METABOLIC PANEL
Anion gap: 8 (ref 5–15)
BUN: 32 mg/dL — ABNORMAL HIGH (ref 8–23)
CO2: 28 mmol/L (ref 22–32)
Calcium: 8 mg/dL — ABNORMAL LOW (ref 8.9–10.3)
Chloride: 99 mmol/L (ref 98–111)
Creatinine, Ser: 1.93 mg/dL — ABNORMAL HIGH (ref 0.44–1.00)
GFR, Estimated: 25 mL/min — ABNORMAL LOW (ref >60)
Glucose, Bld: 130 mg/dL — ABNORMAL HIGH (ref 70–99)
Potassium: 3.3 mmol/L — ABNORMAL LOW (ref 3.5–5.1)
Sodium: 135 mmol/L (ref 135–145)

## 2023-10-24 LAB — ECHOCARDIOGRAM COMPLETE
AR max vel: 1.98 cm2
AV Peak grad: 8.4 mm[Hg]
Ao pk vel: 1.45 m/s
Area-P 1/2: 3.89 cm2
Height: 63 in
S' Lateral: 3 cm
Weight: 3019.42 [oz_av]

## 2023-10-24 LAB — GLUCOSE, CAPILLARY
Glucose-Capillary: 101 mg/dL — ABNORMAL HIGH (ref 70–99)
Glucose-Capillary: 183 mg/dL — ABNORMAL HIGH (ref 70–99)
Glucose-Capillary: 365 mg/dL — ABNORMAL HIGH (ref 70–99)
Glucose-Capillary: 244 mg/dL — ABNORMAL HIGH (ref 70–99)

## 2023-10-24 LAB — CULTURE, BLOOD (ROUTINE X 2)
Culture: NO GROWTH
Special Requests: ADEQUATE

## 2023-10-24 MED ORDER — BUPIVACAINE HCL (PF) 0.5 % IJ SOLN
10.0000 mL | Freq: Once | INTRAMUSCULAR | Status: DC
  Filled 2023-10-24: qty 10

## 2023-10-24 MED ORDER — POTASSIUM CHLORIDE 20 MEQ PO PACK
40.0000 meq | PACK | Freq: Once | ORAL | Status: AC
  Administered 2023-10-24: 40 meq via ORAL
  Filled 2023-10-24: qty 2

## 2023-10-24 MED ORDER — TRIAMCINOLONE ACETONIDE 40 MG/ML IJ SUSP
80.0000 mg | Freq: Once | INTRAMUSCULAR | Status: DC
  Filled 2023-10-24: qty 2

## 2023-10-24 MED ORDER — ACETAMINOPHEN 500 MG PO TABS
500.0000 mg | ORAL_TABLET | Freq: Four times a day (QID) | ORAL | Status: DC | PRN
  Administered 2023-10-24 – 2023-10-26 (×6): 500 mg via ORAL
  Filled 2023-10-24 (×5): qty 1

## 2023-10-24 NOTE — Consult Note (Addendum)
ORTHOPAEDIC CONSULTATION  REQUESTING PHYSICIAN: Loyce Dys, MD  Chief Complaint:   Right knee pain.  History of Present Illness: Tiffany [name] is an 89 y.o. female with multiple medical problems including coronary artery disease, stage IV chronic kidney disease, hypertension, hyperlipidemia, heart failure with reduced ejection fraction, paroxysmal atrial fibrillation requiring anticoagulation, and insulin-dependent diabetes who lives with her husband.  She is a minimal ambulator due to multiple medical issues, as well as due to significant degenerative joint disease of her right knee.  The patient is status post a left total knee arthroplasty done by myself in May, 2020 from which she has done quite well.  She was last seen by myself for her right knee symptoms in August, 2020, at which time she received a steroid injection into the right knee.  She recently was admitted for initiation of hemodialysis and had a dialysis catheter placed through her right IJ.  She has been complaining of worsening right knee pain, so orthopedics has been consulted for consideration of a repeat cortisone injection into her right knee.  The patient denies any recent reinjury to the knee.  Past Medical History:  Diagnosis Date   Anemia    Aortic atherosclerosis (HCC)    Arthritis    CAD (coronary artery disease) 02/27/2015   a.) MPI 02/27/2015: EF 62%, small/mild area of septal hypoperfusion with borderline partial redistribution; equivocal study; b.) LHC 03/13/2015: LVEDP 16 mmHg; 10% mLAD, 10% RCA --> no intervention required (med mgmt); c.) MPI 03/16/2019: EF 56%; normal study.   Cataracts, both eyes    Cervical disc disease    a.) s/p cervical fusion   Chickenpox    CKD (chronic kidney disease), stage IV (HCC)    Complication of anesthesia    a.) remote h/o PONV; b.) difficult intubation related to remote cervical fusion   Difficult  intubation    a.) secondary to remote cervical fusion; reports severe pain s/p being intubated   Dyspnea on exertion    HFrEF (heart failure with reduced ejection fraction) (HCC)    a.) TTE 01/11/2014: EF >55%, mod LVH, MAC, mild RAE, mild MR/TR, sev PR; b.) TTE 11/14/2018: EF 40%, sep and inf HK, mild LAE, triv P, mod MR/TR, G2DD; c.) TTE 11/15/2020: EF 45%, sep HK, mild LVH, triv AR/PR, mild MR/TR, d.) TTE 10/28/2021: EF 45%, mild glob HK, mild LVH, triv AR/PR, mild MR, mod TR; e.) TTE 11/06/2021: EF 30-35%, mid apical and anteroseptal HK, mild TR, mod MR, G2DD.   History of recurrent UTI (urinary tract infection)    Hyperlipidemia    Hypertension    Incontinence in female    wears pads   LBBB (left bundle branch block)    Long term (current) use of anticoagulants    a.) rivaroxaban   Long-term current use of opiate analgesic    a.) hydrocodone/APAP (Norco)   Lumbar disc disease    a.) s/p lumbar interbody fusion L2-L5   PAF (paroxysmal atrial fibrillation) (HCC)    a.) CHA2DS2VASc = 7 (age x2, sex, HFrEF, HTN, vascular diasease, T2DM); b.) s/p DCCV 01/12/2017 (150 J x 1) and 11/04/2021 (120 J x 1); c.) rate/rhythm maintained on oral amiodarone + metoprolol succinate; chronically anticoagulated with rivaroxaban   PONV (postoperative nausea and vomiting)    with 1st pregnancy 50 years ago and no problem since then   Right leg weakness    Sciatica    Spinal stenosis of lumbar region    Type 2 diabetes mellitus treated  with insulin (HCC)    Ventral hernia    Past Surgical History:  Procedure Laterality Date   CARDIOVERSION N/A 11/04/2021   Procedure: CARDIOVERSION;  Surgeon: Dalia Heading, MD;  Location: [facility];  Service: Cardiovascular;  Laterality: N/A;   CARDIOVERSION N/A 03/17/2023   Procedure: CARDIOVERSION;  Surgeon: Marcina Millard, MD;  Location: [facility];  Service: Cardiovascular;  Laterality: N/A;   CATARACT EXTRACTION, BILATERAL     CERVICAL LAMINECTOMY  1996    CYSTOSCOPY W/ RETROGRADES  12/06/2020   Procedure: CYSTOSCOPY WITH RETROGRADE PYELOGRAM;  Surgeon: Sondra Come, MD;  Location: [facility];  Service: Urology;;   CYSTOSCOPY W/ URETERAL STENT PLACEMENT Left 04/10/2022   Procedure: CYSTOSCOPY WITH STENT REPLACEMENT;  Surgeon: Sondra Come, MD;  Location: [facility];  Service: Urology;  Laterality: Left;   CYSTOSCOPY W/ URETERAL STENT PLACEMENT Left 03/12/2023   Procedure: CYSTOSCOPY WITH STENT EXCHANGE;  Surgeon: Sondra Come, MD;  Location: [facility];  Service: Urology;  Laterality: Left;   CYSTOSCOPY WITH STENT PLACEMENT Left 12/06/2020   Procedure: CYSTOSCOPY WITH STENT PLACEMENT;  Surgeon: Sondra Come, MD;  Location: [facility];  Service: Urology;  Laterality: Left;   DIALYSIS/PERMA CATHETER INSERTION Right 10/22/2023   Procedure: DIALYSIS/PERMA CATHETER INSERTION;  Surgeon: Renford Dills, MD;  Location: [facility];  Service: Cardiovascular;  Laterality: Right;   ELECTROPHYSIOLOGIC STUDY N/A 01/12/2017   Procedure: Cardioversion;  Surgeon: Dalia Heading, MD;  Location: [facility];  Service: Cardiovascular;  Laterality: N/A;   INSERTION OF MESH N/A 08/27/2022   Procedure: INSERTION OF MESH;  Surgeon: Sung Amabile, DO;  Location: [facility];  Service: General;  Laterality: N/A;   JOINT REPLACEMENT     LATERAL FUSION LUMBAR SPINE, TRANSVERSE  01/2010   L2-L5   LEFT HEART CATH AND CORONARY ANGIOGRAPHY Left 03/13/2015   Procedure: LEFT HEART CATH AND CORONARY ANGIOGRAPHY; Location: [facility]; Surgeon: Harold Hedge, MD   ROTATOR CUFF REPAIR Right 2010   TONSILLECTOMY     TOTAL KNEE ARTHROPLASTY Left 05/23/2019   Procedure: TOTAL KNEE ARTHROPLASTY - LEFT - DIABETIC;  Surgeon: Christena Flake, MD;  Location: [facility];  Service: Orthopedics;  Laterality: Left;   URETEROSCOPY N/A 12/06/2020   Procedure: DIAGNOSTIC URETEROSCOPY;  Surgeon: Sondra Come, MD;  Location: [facility];  Service: Urology;  Laterality: N/A;   XI ROBOTIC  ASSISTED VENTRAL HERNIA N/A 08/27/2022   Procedure: XI ROBOTIC ASSISTED VENTRAL HERNIA;  Surgeon: Sung Amabile, DO;  Location: [facility];  Service: General;  Laterality: N/A;   Social History   Socioeconomic History   Marital status: Married    Spouse name: Molly Maduro   Number of children: Not on file   Years of education: Not on file   Highest education level: Not on file  Occupational History   Not on file  Tobacco Use   Smoking status: Never    Passive exposure: Never   Smokeless tobacco: Never  Vaping Use   Vaping status: Never Used  Substance and Sexual Activity   Alcohol use: Yes    Alcohol/week: 1.0 standard drink of alcohol    Types: 1 Glasses of wine per week    Comment: RARE   Drug use: Never   Sexual activity: Not Currently    Birth control/protection: Post-menopausal  Other Topics Concern   Not on file  Social History Narrative   Not on file   Social Determinants of Health   Financial Resource Strain: Low Risk  (09/29/2023)  Received from [facility]   Overall Financial Resource Strain (CARDIA)    Difficulty of Paying Living Expenses: Not hard at all  Food Insecurity: No Food Insecurity (10/19/2023)   Hunger Vital Sign    Worried About Running Out of Food in the Last Year: Never true    Ran Out of Food in the Last Year: Never true  Transportation Needs: No Transportation Needs (10/19/2023)   PRAPARE - Administrator, Civil Service (Medical): No    Lack of Transportation (Non-Medical): No  Physical Activity: Not on file  Stress: Not on file  Social Connections: Not on file   Family History  Adopted: Yes   Allergies  Allergen Reactions   Amlodipine Swelling   Codeine Nausea And Vomiting    Tolerates low doses hydrocodone   Tizanidine     Didn't feel good taking it   Doxycycline Nausea And Vomiting   Prior to Admission medications   Medication Sig Start Date End Date Taking? Authorizing Provider  acetaminophen (TYLENOL)  500 MG tablet Take 500 mg by mouth 3 (three) times daily.   Yes [provider]  amiodarone (PACERONE) 200 MG tablet Take 200 mg by mouth daily.   Yes [provider]  glimepiride (AMARYL) 2 MG tablet Take 2 mg by mouth daily with breakfast.   Yes [provider]  glucose blood (PRECISION QID TEST) test strip Use 2 (two) times daily Use as instructed. 05/03/20  Yes [provider]  HYDROcodone-acetaminophen (NORCO) 5-325 MG tablet Take 1 tablet by mouth every 6 (six) hours as needed for up to 6 doses for moderate pain. Patient taking differently: Take 1 tablet by mouth every 6 (six) hours as needed for moderate pain (pain score 4-6). Takes 1-2 daily for pain 08/28/22  Yes Sakai, Isami, DO  insulin glargine, 1 Unit Dial, (TOUJEO SOLOSTAR) 300 UNIT/ML Solostar Pen Inject 10-14 Units into the skin daily. 10/24/20  Yes [provider]  Insulin Pen Needle (BD PEN NEEDLE NANO U/F) 32G X 4 MM MISC USE WITH INSULIN PEN TWICE DAILY OR AS DIRECTED 10/18/19  Yes [provider]  metoprolol succinate (TOPROL-XL) 25 MG 24 hr tablet Take 12.5 mg by mouth daily.   Yes [provider]  NOVOLOG FLEXPEN 100 UNIT/ML FlexPen Inject 4-6 Units into the skin daily before supper. Sliding scale 08/30/20  Yes [provider]  polyethylene glycol (MIRALAX / GLYCOLAX) 17 g packet Take 8.5-17 g by mouth daily as needed for moderate constipation. Takes at bedtime   Yes [provider]  predniSONE (DELTASONE) 5 MG tablet Take 5-30 mg by mouth See admin instructions. 30 mg x1 , 25 mg x 1, 20 mg x 1, 15 mg today 10/22, 10mg  tomorrow 10/23, 5 mg on Thursday then stop 02/28/22  Yes [provider]  rivaroxaban (XARELTO) 15 MG TABS tablet Take 1 tablet (15 mg total) by mouth daily with breakfast. 11/07/21  Yes Leeroy Bock, MD  torsemide (DEMADEX) 20 MG tablet Take 0.5 tablets (10 mg total) by mouth daily. Patient taking differently: Take 20 mg by  mouth daily. Patient taking daily 11/07/21  Yes Leeroy Bock, MD   ECHOCARDIOGRAM COMPLETE  Result Date: 10/24/2023    ECHOCARDIOGRAM REPORT   Patient Name:   Tiffany [name] Date of Exam: 10/24/2023 Medical Rec #:  [id number]        Height:       63.0 in Accession #:    [id number]  Weight:       188.7 lb Date of Birth:  Sep 16, 1933        BSA:          1.886 m Patient Age:    89 years         BP:           132/65 mmHg Patient Gender: F                HR:           72 bpm. Exam Location:  [facility] Procedure: 2D Echo Indications:     CHF- Acute Systolic I50.21  History:         Patient has prior history of Echocardiogram examinations.  Sonographer:     Elwin Sleight RDCS Referring Phys:  [id number] Loyce Dys Diagnosing Phys: Julien Nordmann MD  Sonographer Comments: Suboptimal parasternal window, suboptimal apical window and suboptimal subcostal window. Image acquisition challenging due to patient body habitus. IMPRESSIONS  1. Left ventricular ejection fraction, by estimation, is 40 to 45%. Left ventricular ejection fraction by PLAX is 42 %. The left ventricle has mildly decreased function. The left ventricle demonstrates global hypokinesis. Left ventricular diastolic parameters are consistent with Grade I diastolic dysfunction (impaired relaxation).  2. Right ventricular systolic function is normal. The right ventricular size is normal. There is moderately elevated pulmonary artery systolic pressure. The estimated right ventricular systolic pressure is 54.6 mmHg.  3. The mitral valve is normal in structure. Mild mitral valve regurgitation. No evidence of mitral stenosis.  4. The aortic valve has an indeterminant number of cusps. Aortic valve regurgitation is not visualized. Aortic valve sclerosis is present, with no evidence of aortic valve stenosis.  5. The inferior vena cava is normal in size with greater than 50% respiratory variability, suggesting right atrial pressure of 3 mmHg. FINDINGS  Left  Ventricle: Left ventricular ejection fraction, by estimation, is 40 to 45%. Left ventricular ejection fraction by PLAX is 42 %. The left ventricle has mildly decreased function. The left ventricle demonstrates global hypokinesis. The left ventricular internal cavity size was normal in size. There is no left ventricular hypertrophy. Left ventricular diastolic parameters are consistent with Grade I diastolic dysfunction (impaired relaxation). Right Ventricle: The right ventricular size is normal. No increase in right ventricular wall thickness. Right ventricular systolic function is normal. There is moderately elevated pulmonary artery systolic pressure. The tricuspid regurgitant velocity is 3.52 m/s, and with an assumed right atrial pressure of 5 mmHg, the estimated right ventricular systolic pressure is 54.6 mmHg. Left Atrium: Left atrial size was normal in size. Right Atrium: Right atrial size was normal in size. Pericardium: There is no evidence of pericardial effusion. Mitral Valve: The mitral valve is normal in structure. Mild mitral valve regurgitation. No evidence of mitral valve stenosis. Tricuspid Valve: The tricuspid valve is normal in structure. Tricuspid valve regurgitation is mild . No evidence of tricuspid stenosis. Aortic Valve: The aortic valve has an indeterminant number of cusps. Aortic valve regurgitation is not visualized. Aortic valve sclerosis is present, with no evidence of aortic valve stenosis. Aortic valve peak gradient measures 8.4 mmHg. Pulmonic Valve: The pulmonic valve was normal in structure. Pulmonic valve regurgitation is not visualized. No evidence of pulmonic stenosis. Aorta: The aortic root is normal in size and structure. Venous: The inferior vena cava is normal in size with greater than 50% respiratory variability, suggesting right atrial pressure of 3 mmHg. IAS/Shunts: No atrial level shunt detected by color flow Doppler.  LEFT VENTRICLE PLAX 2D LV EF:         Left             Diastology                ventricular     LV e' medial:    4.24 cm/s                ejection        LV E/e' medial:  20.1                fraction by     LV e' lateral:   7.83 cm/s                PLAX is 42      LV E/e' lateral: 10.9                %. LVIDd:         3.75 cm LVIDs:         3.00 cm LV PW:         1.25 cm LV IVS:        1.25 cm LVOT diam:     1.90 cm LV SV:         52 LV SV Index:   28 LVOT Area:     2.84 cm  LV Volumes (MOD) LV vol d, MOD    79.9 ml A4C: LV SV MOD A4C:   79.9 ml RIGHT VENTRICLE RV Basal diam:  3.30 cm RV S prime:     17.00 cm/s TAPSE (M-mode): 2.2 cm LEFT ATRIUM             Index        RIGHT ATRIUM           Index LA diam:        3.40 cm 1.80 cm/m   RA Area:     12.30 cm LA Vol (A2C):   48.4 ml 25.66 ml/m  RA Volume:   26.20 ml  13.89 ml/m LA Vol (A4C):   48.4 ml 25.66 ml/m LA Biplane Vol: 48.7 ml 25.82 ml/m  AORTIC VALVE                 PULMONIC VALVE AV Area (Vmax): 1.98 cm     PV Vmax:        1.44 m/s AV Vmax:        144.50 cm/s  PV Peak grad:   8.2 mmHg AV Peak Grad:   8.4 mmHg     RVOT Peak grad: 4 mmHg LVOT Vmax:      101.00 cm/s LVOT Vmean:     59.100 cm/s LVOT VTI:       0.185 m  AORTA Ao Root diam: 2.50 cm Ao Asc diam:  3.60 cm MITRAL VALVE               TRICUSPID VALVE MV Area (PHT): 3.89 cm    TR Peak grad:   49.6 mmHg MV Decel Time: 195 msec    TR Vmax:        352.00 cm/s MV E velocity: 85.40 cm/s MV A velocity: 93.10 cm/s  SHUNTS MV E/A ratio:  0.92        Systemic VTI:  0.18 m                            Systemic Diam: 1.90 cm Julien Nordmann MD  Electronically signed by Julien Nordmann MD Signature Date/Time: 10/24/2023/9:32:01 AM    Final    MR ABDOMEN W WO CONTRAST  Result Date: 10/22/2023 CLINICAL DATA:  Characterize left renal mass identified by prior ultrasound EXAM: MRI ABDOMEN WITHOUT AND WITH CONTRAST TECHNIQUE: Multiplanar multisequence MR imaging of the abdomen was performed both before and after the administration of intravenous contrast. CONTRAST:   7.52mL GADAVIST GADOBUTROL 1 MMOL/ML IV SOLN COMPARISON:  Renal ultrasound, 10/19/2023, CT abdomen pelvis, 11/05/2021 FINDINGS: Examination is generally limited by breath motion artifact, particularly multiphasic contrast enhanced sequences. Lower chest: No acute abnormality. Hepatobiliary: No solid liver abnormality is seen. No gallstones, gallbladder wall thickening, or biliary dilatation. Pancreas: Severely atrophic pancreas. Numerous fluid signal cystic lesions scattered throughout the pancreas, largest in the pancreatic neck measuring 1.2 x 0.9 cm (series 4, image 20). Appearance not obviously changed compared to prior noncontrast examination dated 11/05/2021. No solid component or suspicious contrast enhancement. No acute inflammatory findings. Spleen: Normal in size without significant abnormality. Adrenals/Urinary Tract: Adrenal glands are unremarkable. Arterially enhancing exophytic mass arising from the peripheral inferior pole of the left kidney measuring 1.7 x 1.4 cm (series 15, image 63). Additional benign fluid signal renal cortical and parapelvic cysts, for which no specific further follow-up or characterization is required. No obvious calculi or hydronephrosis. Stomach/Bowel: Stomach is within normal limits. No evidence of bowel wall thickening, distention, or inflammatory changes. Vascular/Lymphatic: Aortic atherosclerosis. No enlarged abdominal lymph nodes. Other: No abdominal wall hernia or abnormality. No ascites. Musculoskeletal: No acute or significant osseous findings. IMPRESSION: 1. Examination is generally limited by breath motion artifact, particularly multiphasic contrast enhanced sequences. 2. Within this limitation, arterially enhancing exophytic mass arising from the peripheral inferior pole of the left kidney measuring 1.7 x 1.4 cm, consistent with a small renal cell carcinoma. 3. No evidence of obvious renal vein invasion, lymphadenopathy, or metastatic disease in the abdomen. 4.  Numerous fluid signal cystic lesions scattered throughout the pancreas, largest in the pancreatic neck measuring 1.2 x 0.9 cm. Appearance not obviously changed compared to prior noncontrast examination dated 11/05/2021. No solid component or suspicious contrast enhancement. 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 As there is no observed increased risk of malignancy for such lesions smaller than 2 cm, especially given evidence of other primary malignancy, initially established imaging stability and advanced patient age, no specific further follow-up or characterization is required. Aortic Atherosclerosis (ICD10-I70.0). Electronically Signed   By: Jearld Lesch M.D.   On: 10/22/2023 21:35    Positive ROS: All other systems have been reviewed and were otherwise negative with the exception of those mentioned in the HPI and as above.  Physical Exam: General:  Alert, no acute distress Psychiatric:  Patient is competent for consent with normal mood and affect   Cardiovascular:  No pedal edema Respiratory:  No wheezing, non-labored breathing GI:  Abdomen is soft and non-tender Skin:  No lesions in the area of chief complaint Neurologic:  Sensation intact distally Lymphatic:  No axillary or cervical lymphadenopathy  Orthopedic Exam:  Orthopedic examination is limited to the right knee and lower extremity.  Skin inspection around the right knee is notable for for mild swelling and a trace effusion, but otherwise is unremarkable.  No erythema, ecchymosis, abrasions, or other skin abnormalities are identified.  She has moderate tenderness to palpation along the medial joint line and mild-moderate tenderness to palpation along the lateral joint line.  She has pain with any attempted active or passive motion of the  knee and is unable to perform an active straight leg raise without pain.  She appears to be grossly neurovascularly intact to the right foot, although the lower leg and foot is wrapped with an Ace  wrap due to chronic swelling.    X-rays:  Recent x-rays of the right tibia and fibula are available for review and have been reviewed by myself.  These images include views of the knee.  These films demonstrate advanced degenerative joint disease involving the medial and patellofemoral compartments.  No fractures or other acute bony processes are identified.  Assessment: Advanced degenerative joint disease right knee in otherwise very sick patient  Plan: The treatment options have been discussed with the patient.  She would like to proceed with a steroid injection into the right knee.  After obtaining verbal consent, the right knee is injected sterilely using a solution of 2 cc of Kenalog 40 (80 mg) and 8 cc of 0.5% Sensorcaine.  The patient tolerated the procedure well.  She may be mobilized with physical therapy tomorrow as symptoms permit.  Thank you for asking me to participate in the care of this most pleasant and unfortunate woman.  I will be happy to follow her with you.   Maryagnes Amos, MD  Beeper #:  [phone]  10/24/2023 3:15 PM

## 2023-10-24 NOTE — Progress Notes (Addendum)
Physical Therapy Treatment Patient Details Name: Tiffany [name] MRN: [id number] DOB: September 06, 1933 Today's Date: 10/24/2023   History of Present Illness Patient is a 89 y.o. female with medical history significant of chronic HFrEF with LVEF 30-35%, CAD, CKD stage IV, PAF status post recent cardioversion on Xarelto, recent obstructive labs at ureteral stone status post stenting,chronic back pain, IDDM with insulin resistance, obesity, presented with worsening of right leg rash and pain. Current MD assessment: Acute on chronic CHF, AKI, and Recurrent R leg cellulitis.    PT Comments  Pt received steroid shot in knee prior to session. Motivated to get up and move.  She is able to get to EOB with mod a x 1 with bed features and cues.  Steady in sitting.  Stood to [employer] with min/mod a x 1 from elevated bed. Weight shifting is initiated prior to pt taking small step to steps to chair with RW and mi a x 1. Pt happy to be up in chair after session.   Discussed discharge plan.  Pt stated she is open to rehab at [facility] if she needs it.  She stated her husband is able to help her and she has all equipment needed but does feel like she needs to be a bit stronger and mobile that she is now.  She is making slow progress but R knee pain remains a barrier to mobility.     If plan is discharge home, recommend the following: Assist for transportation;Help with stairs or ramp for entrance;Assistance with cooking/housework;A little help with walking and/or transfers;A little help with bathing/dressing/bathroom   Can travel by private vehicle        Equipment Recommendations       Recommendations for Other Services       Precautions / Restrictions Precautions Precautions: Fall Restrictions Weight Bearing Restrictions: No     Mobility  Bed Mobility Overal bed mobility: Needs Assistance Bed Mobility: Supine to Sit     Supine to sit: Mod assist          Transfers Overall transfer level: Needs  assistance Equipment used: Rolling walker (2 wheels) Transfers: Sit to/from Stand Sit to Stand: Min assist, From elevated surface                Ambulation/Gait Ambulation/Gait assistance: Min assist Gait Distance (Feet): 3 Feet Assistive device: Rolling walker (2 wheels) Gait Pattern/deviations: Step-to pattern, Decreased step length - right, Decreased step length - left, Decreased stance time - right Gait velocity: decreased     General Gait Details: steps to chair but no true gait   Stairs             Wheelchair Mobility     Tilt Bed    Modified Rankin (Stroke Patients Only)       Balance Overall balance assessment: Needs assistance Sitting-balance support: Feet supported Sitting balance-Leahy Scale: Good     Standing balance support: During functional activity, Bilateral upper extremity supported Standing balance-Leahy Scale: Fair Standing balance comment: light hold on RW, static standing                            Cognition Arousal: Alert Behavior During Therapy: WFL for tasks assessed/performed Overall Cognitive Status: Within Functional Limits for tasks assessed  Exercises      General Comments        Pertinent Vitals/Pain Pain Assessment Pain Assessment: Faces Faces Pain Scale: Hurts whole lot Pain Location: R knee Pain Descriptors / Indicators: Sore, Tender, Grimacing, Guarding Pain Intervention(s): Limited activity within patient's tolerance, Monitored during session, Repositioned    Home Living                          Prior Function            PT Goals (current goals can now be found in the care plan section) Progress towards PT goals: Progressing toward goals    Frequency    Min 1X/week      PT Plan      Co-evaluation              AM-PAC PT "6 Clicks" Mobility   Outcome Measure  Help needed turning from your back to your  side while in a flat bed without using bedrails?: A Lot Help needed moving from lying on your back to sitting on the side of a flat bed without using bedrails?: A Lot Help needed moving to and from a bed to a chair (including a wheelchair)?: A Little Help needed standing up from a chair using your arms (e.g., wheelchair or bedside chair)?: A Little Help needed to walk in hospital room?: A Lot Help needed climbing 3-5 steps with a railing? : Total 6 Click Score: 13    End of Session Equipment Utilized During Treatment: Gait belt Activity Tolerance: Patient tolerated treatment well;Patient limited by pain Patient left: in chair;with chair alarm set;with call bell/phone within reach Nurse Communication: Mobility status PT Visit Diagnosis: Muscle weakness (generalized) (M62.81);Pain Pain - Right/Left: Right Pain - part of body: Leg     Time: 1027-2536 PT Time Calculation (min) (ACUTE ONLY): 13 min  Charges:    $Therapeutic Activity: 8-22 mins PT General Charges $$ ACUTE PT VISIT: 1 Visit                   Danielle Dess, PTA 10/24/23, 3:46 PM

## 2023-10-24 NOTE — Progress Notes (Signed)
Progress Note   Patient: Tiffany [name] FIE:[id number] DOB: 1933-05-30 DOA: 10/19/2023     5 DOS: the patient was seen and examined on 10/24/2023      Subjective:  Patient seen and examined this morning Denies nausea vomiting abdominal pain or chest pain I discussed with her the findings of her MRI of the abdomen reports   Brief hospital course: From HPI "Kavitha Webbe is a 89 y.o. female with medical history significant of chronic HFrEF with LVEF 30-35%, CAD, CKD stage IV, PAF status post recent cardioversion on Xarelto, recent obstructive labs at ureteral stone status post stenting April 20204,chronic back pain, IDDM with insulin resistance, obesity, presented with worsening of right leg rash and pain. Patient underwent cardioversion earlier this year, this time she denied any palpitations.  She has been taking torsemide 20 mg every day but does not see a significant improvement of her leg swelling. ED Course: Afebrile, nontachycardic blood pressure 109/61 O2 saturation 100% on room air.  WBC 15.3, hemoglobin 12.8, K4.2, creatinine 3.1 compared to 1.82 months ago.  Glucose 263.  "   Assessment and Plan:   Acute on chronic HFrEF decompensation -Anasarca -Strongly suspect cardiorenal syndrome, given the significant status of fluid overload. Diuresis according to nephrologist recommendation Follow-up on echocardiogram Monitor renal function closely     Left renal cell carcinoma Around the abdomen obtained showing findings of left renal exophytic mass concerning for renal cell carcinoma I have discussed the case with urology Dr. Cardell Peach who recommends outpatient follow-up   AKI on CKD -Suspect cardiorenal syndrome as patient is significantly fluid overloaded, management as above Uric acid elevated and patient currently on allopurinol Plan of care discussed with nephrologist  Have agreed with nephrology for dialysis Vascular surgeon has placed permacath for hemodialysis on  10/22/2023   Recurrent right leg cellulitis -Check MRSA and strep a PCR Continue Monitor blood culture results -Currently there is no signs of MRSA infection   Severe right knee pain secondary to osteoarthritis Orthopedics requested for input and possible intra-articular injection as requested by patient's husband Continue current pain regiment  Elevated lactic acid -With fluid overload, suspect cardiorenal syndrome and intravascular depletion.   PAF -Status post cardioversion in April of this year.  Patient denied any palpitations.  At this point unclear what causes her CHF decompensation.  Continue   IDDM with hyperglycemia, with insulin resistance as well as hypoglycemia Insulin adjusted for hypoglycemia Monitor glucose closely   Recent left-sided hydronephrosis secondary to obstructing ureteral stone Left renal cell carcinoma -No urinary symptoms MRI of the abdomen showed findings of exophytic mass in the left kidney concerning for left renal cell carcinoma I discussed this with urologist Dr.Gay who has agreed to follow the patient up as an outpatient   Urinary retention Voiding trial today   Chronic LBBB -With low LVEF, consider CRT. Outpatient cardiology follow up.   DVT prophylaxis: Xarelto Code Status: Full code   consults called: Nephrology Admission status: Telemetry admission     Physical Exam: Eyes: PERRL, lids and conjunctivae normal ENMT: Mucous membranes are moist. Posterior pharynx clear of any exudate or lesions.Normal dentition.  Neck: normal, supple, no masses, no thyromegaly Respiratory: clear to auscultation bilaterally Cardiovascular: Regular rate and rhythm, no murmurs / rubs / gallops.  Abdomen: no tenderness, no masses palpated.  Musculoskeletal: no clubbing / cyanosis.  Skin: Erythema to the right lower extremity is improving Neurologic: CN 2-12 grossly intact. Sensation intact, DTR normal. Strength 5/5 in all 4.  Psychiatric:  Normal  judgment and insight. Alert and oriented x 3. Normal mood.        Data Reviewed: I have reviewed patient's lab report as shown below   Family Communication: Discussed with husband present at bedside   Time spent: 41 minutes    Latest Ref Rng & Units 10/24/2023    3:32 AM 10/23/2023    3:38 AM 10/22/2023    3:53 AM  BMP  Glucose 70 - 99 mg/dL 664  403  47   BUN 8 - 23 mg/dL 32  50  75   Creatinine 0.44 - 1.00 mg/dL 4.74  2.59  5.63   Sodium 135 - 145 mmol/L 135  135  138   Potassium 3.5 - 5.1 mmol/L 3.3  3.6  3.5   Chloride 98 - 111 mmol/L 99  96  100   CO2 22 - 32 mmol/L 28  26  28    Calcium 8.9 - 10.3 mg/dL 8.0  8.1  8.4         Latest Ref Rng & Units 10/24/2023    3:32 AM 10/23/2023    3:38 AM 10/22/2023    3:53 AM  CBC  WBC 4.0 - 10.5 K/uL 8.6  9.9  11.5   Hemoglobin 12.0 - 15.0 g/dL 9.5  87.5  9.4   Hematocrit 36.0 - 46.0 % 30.7  33.9  29.3   Platelets 150 - 400 K/uL 167  169  163     Vitals:   10/23/23 2337 10/24/23 0500 10/24/23 0835 10/24/23 1135  BP: (!) 141/70 132/65 (!) 142/68 (!) 146/71  Pulse: 88 75 73 73  Resp:  (!) 25 18 17   Temp:  99.3 F (37.4 C) 98.4 F (36.9 C) 98.2 F (36.8 C)  TempSrc:  Oral Oral Oral  SpO2:  98% 96% 93%  Weight:      Height:         Author: Loyce Dys, MD 10/24/2023 12:35 PM  For on call review [email or web address].

## 2023-10-24 NOTE — Progress Notes (Signed)
[facility]  ROUNDING NOTE   Subjective:   Patient seen sitting up in bed Husband at bedside Appears well today Tolerating meals without nausea vomiting  Dialysis scheduled for Monday.   Objective:  Vital signs in last 24 hours:  Temp:  [98.1 F (36.7 C)-100.9 F (38.3 C)] 98.2 F (36.8 C) (10/27 1135) Pulse Rate:  [73-88] 73 (10/27 1135) Resp:  [16-25] 17 (10/27 1135) BP: (110-146)/(51-71) 146/71 (10/27 1135) SpO2:  [93 %-98 %] 93 % (10/27 1135)  Weight change: -3.3 kg Filed Weights   10/23/23 0500 10/23/23 0753 10/23/23 1039  Weight: 87 kg 87 kg 85.6 kg    Intake/Output: I/O last 3 completed shifts: In: 120 [P.O.:120] Out: 2600 [Urine:1600; Other:1000]   Intake/Output this shift:  No intake/output data recorded.  Physical Exam: General: NAD  Head: Normocephalic, atraumatic. Moist oral mucosal membranes  Eyes: Anicteric  Lungs:  Clear to auscultation  Heart: Regular rate and rhythm  Abdomen:  Soft, nontender,   Extremities: 1+ peripheral edema.  Neurologic: Alert, moving all four extremities  Skin: BLE erythema  ACCESS Rt Chest permcath placed on 10/22/23    Basic Metabolic Panel: Recent Labs  Lab 10/20/23 0334 10/21/23 0505 10/22/23 0353 10/23/23 0338 10/24/23 0332  NA 134* 136 138 135 135  K 4.3 3.6 3.5 3.6 3.3*  CL 100 100 100 96* 99  CO2 24 25 28 26 28   GLUCOSE 67* 42* 47* 275* 130*  BUN 84* 83* 75* 50* 32*  CREATININE 3.22* 3.21* 2.90* 2.31* 1.93*  CALCIUM 8.4* 8.3* 8.4* 8.1* 8.0*    Liver Function Tests: No results for input(s): "AST", "ALT", "ALKPHOS", "BILITOT", "PROT", "ALBUMIN" in the last 168 hours. No results for input(s): "LIPASE", "AMYLASE" in the last 168 hours. No results for input(s): "AMMONIA" in the last 168 hours.  CBC: Recent Labs  Lab 10/19/23 0824 10/21/23 0505 10/22/23 0353 10/23/23 0338 10/24/23 0332  WBC 15.3* 14.0* 11.5* 9.9 8.6  NEUTROABS  --  12.5* 9.5* 8.0* 5.8  HGB 12.8 9.7* 9.4* 10.5*  9.5*  HCT 40.8 29.7* 29.3* 33.9* 30.7*  MCV 85.9 83.2 84.4 86.7 86.2  PLT 253 166 163 169 167    Cardiac Enzymes: Recent Labs  Lab 10/19/23 0827  CKTOTAL 16*    BNP: Invalid input(s): "POCBNP"  CBG: Recent Labs  Lab 10/23/23 1341 10/23/23 1749 10/23/23 2135 10/24/23 0838 10/24/23 1136  GLUCAP 163* 127* 177* 101* 183*    Microbiology: Results for orders placed or performed during the hospital encounter of 10/19/23  Blood culture (routine x 2)     Status: Abnormal   Collection Time: 10/19/23  9:10 AM   Specimen: BLOOD  Result Value Ref Range Status   Specimen Description   Final    BLOOD RIGHT WRIST Performed at [facility], [address], [place], [place] [address]    Special Requests   Final    BOTTLES DRAWN AEROBIC AND ANAEROBIC Blood Culture adequate volume Performed at [facility], [address] [place], [place] [address]    Culture  Setup Time   Final    Organism ID to follow GRAM NEGATIVE RODS ANAEROBIC BOTTLE ONLY CRITICAL RESULT CALLED TO, READ BACK BY AND VERIFIED WITH: JASON ROBBINS PHARMD @0000  10/20/23 ASW Performed at [facility], [address] [place], [place] [address]    Culture KLEBSIELLA OXYTOCA (A)  Final   Report Status 10/22/2023 FINAL  Final   Organism ID, Bacteria KLEBSIELLA OXYTOCA  Final  Susceptibility   Klebsiella oxytoca - MIC*    AMPICILLIN >=32 RESISTANT Resistant     CEFEPIME <=0.12 SENSITIVE Sensitive     CEFTAZIDIME <=1 SENSITIVE Sensitive     CEFTRIAXONE <=0.25 SENSITIVE Sensitive     CIPROFLOXACIN <=0.25 SENSITIVE Sensitive     GENTAMICIN <=1 SENSITIVE Sensitive     IMIPENEM <=0.25 SENSITIVE Sensitive     TRIMETH/SULFA <=20 SENSITIVE Sensitive     AMPICILLIN/SULBACTAM 8 SENSITIVE Sensitive     PIP/TAZO <=4 SENSITIVE Sensitive ug/mL    * KLEBSIELLA OXYTOCA  Blood Culture ID Panel (Reflexed)     Status: Abnormal   Collection Time: 10/19/23  9:10 AM  Result Value Ref Range  Status   Enterococcus faecalis NOT DETECTED NOT DETECTED Final   Enterococcus Faecium NOT DETECTED NOT DETECTED Final   Listeria monocytogenes NOT DETECTED NOT DETECTED Final   Staphylococcus species NOT DETECTED NOT DETECTED Final   Staphylococcus aureus (BCID) NOT DETECTED NOT DETECTED Final   Staphylococcus epidermidis NOT DETECTED NOT DETECTED Final   Staphylococcus lugdunensis NOT DETECTED NOT DETECTED Final   Streptococcus species NOT DETECTED NOT DETECTED Final   Streptococcus agalactiae NOT DETECTED NOT DETECTED Final   Streptococcus pneumoniae NOT DETECTED NOT DETECTED Final   Streptococcus pyogenes NOT DETECTED NOT DETECTED Final   A.calcoaceticus-baumannii NOT DETECTED NOT DETECTED Final   Bacteroides fragilis NOT DETECTED NOT DETECTED Final   Enterobacterales DETECTED (A) NOT DETECTED Final    Comment: Enterobacterales represent a large order of gram negative bacteria, not a single organism. CRITICAL RESULT CALLED TO, READ BACK BY AND VERIFIED WITH: JASON ROBBINS PHARMD @0000  10/20/23 ASW    Enterobacter cloacae complex NOT DETECTED NOT DETECTED Final   Escherichia coli NOT DETECTED NOT DETECTED Final   Klebsiella aerogenes NOT DETECTED NOT DETECTED Final   Klebsiella oxytoca DETECTED (A) NOT DETECTED Final    Comment: CRITICAL RESULT CALLED TO, READ BACK BY AND VERIFIED WITH: JASON ROBBINS PHARMD @0000  10/20/23 ASW    Klebsiella pneumoniae NOT DETECTED NOT DETECTED Final   Proteus species NOT DETECTED NOT DETECTED Final   Salmonella species NOT DETECTED NOT DETECTED Final   Serratia marcescens NOT DETECTED NOT DETECTED Final   Haemophilus influenzae NOT DETECTED NOT DETECTED Final   Neisseria meningitidis NOT DETECTED NOT DETECTED Final   Pseudomonas aeruginosa NOT DETECTED NOT DETECTED Final   Stenotrophomonas maltophilia NOT DETECTED NOT DETECTED Final   Candida albicans NOT DETECTED NOT DETECTED Final   Candida auris NOT DETECTED NOT DETECTED Final   Candida  glabrata NOT DETECTED NOT DETECTED Final   Candida krusei NOT DETECTED NOT DETECTED Final   Candida parapsilosis NOT DETECTED NOT DETECTED Final   Candida tropicalis NOT DETECTED NOT DETECTED Final   Cryptococcus neoformans/gattii NOT DETECTED NOT DETECTED Final   CTX-M ESBL NOT DETECTED NOT DETECTED Final   Carbapenem resistance IMP NOT DETECTED NOT DETECTED Final   Carbapenem resistance KPC NOT DETECTED NOT DETECTED Final   Carbapenem resistance NDM NOT DETECTED NOT DETECTED Final   Carbapenem resist OXA 48 LIKE NOT DETECTED NOT DETECTED Final   Carbapenem resistance VIM NOT DETECTED NOT DETECTED Final    Comment: Performed at [facility], [address] [place], [place] [address]  Blood culture (routine x 2)     Status: None   Collection Time: 10/19/23 11:45 AM   Specimen: BLOOD  Result Value Ref Range Status   Specimen Description BLOOD LEFT ANTECUBITAL  Final   Special Requests   Final    BOTTLES DRAWN  AEROBIC AND ANAEROBIC Blood Culture adequate volume   Culture   Final    NO GROWTH 5 DAYS Performed at [facility], [address] [place], [place] [address]    Report Status 10/24/2023 FINAL  Final  Group A Strep by PCR     Status: None   Collection Time: 10/19/23  4:15 PM   Specimen: Throat; Sterile Swab  Result Value Ref Range Status   Group A Strep by PCR NOT DETECTED NOT DETECTED Final    Comment: Performed at [facility], [address], [place], [place] [address]  MRSA Next Gen by PCR, Nasal     Status: Abnormal   Collection Time: 10/21/23  6:15 PM   Specimen: Nasal Mucosa; Nasal Swab  Result Value Ref Range Status   MRSA by PCR Next Gen DETECTED (A) NOT DETECTED Final    Comment: RESULT CALLED TO, READ BACK BY AND VERIFIED WITH: YASMINE SORIANO @1935  ON 10/21/23 SKL (NOTE) The GeneXpert MRSA Assay (FDA approved for NASAL specimens only), is one component of a comprehensive MRSA colonization surveillance program. It is not intended  to diagnose MRSA infection nor to guide or monitor treatment for MRSA infections. Test performance is not FDA approved in patients less than 53 years old. Performed at [facility], [address] [place], [place] [address]   Culture, blood (Routine X 2) w Reflex to ID Panel     Status: None (Preliminary result)   Collection Time: 10/23/23  3:10 PM   Specimen: BLOOD  Result Value Ref Range Status   Specimen Description BLOOD BLOOD RIGHT HAND  Final   Special Requests   Final    BOTTLES DRAWN AEROBIC AND ANAEROBIC Blood Culture adequate volume   Culture   Final    NO GROWTH < 24 HOURS Performed at [facility], [address], [place], [place] [address]    Report Status PENDING  Incomplete  Culture, blood (Routine X 2) w Reflex to ID Panel     Status: None (Preliminary result)   Collection Time: 10/23/23  3:17 PM   Specimen: BLOOD  Result Value Ref Range Status   Specimen Description BLOOD BLOOD LEFT HAND  Final   Special Requests   Final    BOTTLES DRAWN AEROBIC AND ANAEROBIC Blood Culture results may not be optimal due to an excessive volume of blood received in culture bottles   Culture   Final    NO GROWTH < 24 HOURS Performed at [facility], [address] [place], [place] [address]    Report Status PENDING  Incomplete    Coagulation Studies: No results for input(s): "LABPROT", "INR" in the last 72 hours.  Urinalysis: No results for input(s): "COLORURINE", "LABSPEC", "PHURINE", "GLUCOSEU", "HGBUR", "BILIRUBINUR", "KETONESUR", "PROTEINUR", "UROBILINOGEN", "NITRITE", "LEUKOCYTESUR" in the last 72 hours.  Invalid input(s): "APPERANCEUR"    Imaging: ECHOCARDIOGRAM COMPLETE  Result Date: 10/24/2023    ECHOCARDIOGRAM REPORT   Patient Name:   Helem ANN Papin Date of Exam: 10/24/2023 Medical Rec #:  [id number]        Height:       63.0 in Accession #:    [id number]       Weight:       188.7 lb Date of Birth:  04/02/1933        BSA:           1.886 m Patient Age:    89 years         BP:  132/65 mmHg Patient Gender: F                HR:           72 bpm. Exam Location:  [facility] Procedure: 2D Echo Indications:     CHF- Acute Systolic I50.21  History:         Patient has prior history of Echocardiogram examinations.  Sonographer:     Elwin Sleight RDCS Referring Phys:  [id number] Loyce Dys Diagnosing Phys: Julien Nordmann MD  Sonographer Comments: Suboptimal parasternal window, suboptimal apical window and suboptimal subcostal window. Image acquisition challenging due to patient body habitus. IMPRESSIONS  1. Left ventricular ejection fraction, by estimation, is 40 to 45%. Left ventricular ejection fraction by PLAX is 42 %. The left ventricle has mildly decreased function. The left ventricle demonstrates global hypokinesis. Left ventricular diastolic parameters are consistent with Grade I diastolic dysfunction (impaired relaxation).  2. Right ventricular systolic function is normal. The right ventricular size is normal. There is moderately elevated pulmonary artery systolic pressure. The estimated right ventricular systolic pressure is 54.6 mmHg.  3. The mitral valve is normal in structure. Mild mitral valve regurgitation. No evidence of mitral stenosis.  4. The aortic valve has an indeterminant number of cusps. Aortic valve regurgitation is not visualized. Aortic valve sclerosis is present, with no evidence of aortic valve stenosis.  5. The inferior vena cava is normal in size with greater than 50% respiratory variability, suggesting right atrial pressure of 3 mmHg. FINDINGS  Left Ventricle: Left ventricular ejection fraction, by estimation, is 40 to 45%. Left ventricular ejection fraction by PLAX is 42 %. The left ventricle has mildly decreased function. The left ventricle demonstrates global hypokinesis. The left ventricular internal cavity size was normal in size. There is no left ventricular hypertrophy. Left ventricular diastolic parameters are  consistent with Grade I diastolic dysfunction (impaired relaxation). Right Ventricle: The right ventricular size is normal. No increase in right ventricular wall thickness. Right ventricular systolic function is normal. There is moderately elevated pulmonary artery systolic pressure. The tricuspid regurgitant velocity is 3.52 m/s, and with an assumed right atrial pressure of 5 mmHg, the estimated right ventricular systolic pressure is 54.6 mmHg. Left Atrium: Left atrial size was normal in size. Right Atrium: Right atrial size was normal in size. Pericardium: There is no evidence of pericardial effusion. Mitral Valve: The mitral valve is normal in structure. Mild mitral valve regurgitation. No evidence of mitral valve stenosis. Tricuspid Valve: The tricuspid valve is normal in structure. Tricuspid valve regurgitation is mild . No evidence of tricuspid stenosis. Aortic Valve: The aortic valve has an indeterminant number of cusps. Aortic valve regurgitation is not visualized. Aortic valve sclerosis is present, with no evidence of aortic valve stenosis. Aortic valve peak gradient measures 8.4 mmHg. Pulmonic Valve: The pulmonic valve was normal in structure. Pulmonic valve regurgitation is not visualized. No evidence of pulmonic stenosis. Aorta: The aortic root is normal in size and structure. Venous: The inferior vena cava is normal in size with greater than 50% respiratory variability, suggesting right atrial pressure of 3 mmHg. IAS/Shunts: No atrial level shunt detected by color flow Doppler.  LEFT VENTRICLE PLAX 2D LV EF:         Left            Diastology                ventricular     LV e' medial:    4.24 cm/s  ejection        LV E/e' medial:  20.1                fraction by     LV e' lateral:   7.83 cm/s                PLAX is 42      LV E/e' lateral: 10.9                %. LVIDd:         3.75 cm LVIDs:         3.00 cm LV PW:         1.25 cm LV IVS:        1.25 cm LVOT diam:     1.90 cm LV SV:          52 LV SV Index:   28 LVOT Area:     2.84 cm  LV Volumes (MOD) LV vol d, MOD    79.9 ml A4C: LV SV MOD A4C:   79.9 ml RIGHT VENTRICLE RV Basal diam:  3.30 cm RV S prime:     17.00 cm/s TAPSE (M-mode): 2.2 cm LEFT ATRIUM             Index        RIGHT ATRIUM           Index LA diam:        3.40 cm 1.80 cm/m   RA Area:     12.30 cm LA Vol (A2C):   48.4 ml 25.66 ml/m  RA Volume:   26.20 ml  13.89 ml/m LA Vol (A4C):   48.4 ml 25.66 ml/m LA Biplane Vol: 48.7 ml 25.82 ml/m  AORTIC VALVE                 PULMONIC VALVE AV Area (Vmax): 1.98 cm     PV Vmax:        1.44 m/s AV Vmax:        144.50 cm/s  PV Peak grad:   8.2 mmHg AV Peak Grad:   8.4 mmHg     RVOT Peak grad: 4 mmHg LVOT Vmax:      101.00 cm/s LVOT Vmean:     59.100 cm/s LVOT VTI:       0.185 m  AORTA Ao Root diam: 2.50 cm Ao Asc diam:  3.60 cm MITRAL VALVE               TRICUSPID VALVE MV Area (PHT): 3.89 cm    TR Peak grad:   49.6 mmHg MV Decel Time: 195 msec    TR Vmax:        352.00 cm/s MV E velocity: 85.40 cm/s MV A velocity: 93.10 cm/s  SHUNTS MV E/A ratio:  0.92        Systemic VTI:  0.18 m                            Systemic Diam: 1.90 cm Julien Nordmann MD Electronically signed by Julien Nordmann MD Signature Date/Time: 10/24/2023/9:32:01 AM    Final    MR ABDOMEN W WO CONTRAST  Result Date: 10/22/2023 CLINICAL DATA:  Characterize left renal mass identified by prior ultrasound EXAM: MRI ABDOMEN WITHOUT AND WITH CONTRAST TECHNIQUE: Multiplanar multisequence MR imaging of the abdomen was performed both before and after the administration of intravenous contrast. CONTRAST:  7.61mL GADAVIST GADOBUTROL 1 MMOL/ML  IV SOLN COMPARISON:  Renal ultrasound, 10/19/2023, CT abdomen pelvis, 11/05/2021 FINDINGS: Examination is generally limited by breath motion artifact, particularly multiphasic contrast enhanced sequences. Lower chest: No acute abnormality. Hepatobiliary: No solid liver abnormality is seen. No gallstones, gallbladder wall thickening, or  biliary dilatation. Pancreas: Severely atrophic pancreas. Numerous fluid signal cystic lesions scattered throughout the pancreas, largest in the pancreatic neck measuring 1.2 x 0.9 cm (series 4, image 20). Appearance not obviously changed compared to prior noncontrast examination dated 11/05/2021. No solid component or suspicious contrast enhancement. No acute inflammatory findings. Spleen: Normal in size without significant abnormality. Adrenals/Urinary Tract: Adrenal glands are unremarkable. Arterially enhancing exophytic mass arising from the peripheral inferior pole of the left kidney measuring 1.7 x 1.4 cm (series 15, image 63). Additional benign fluid signal renal cortical and parapelvic cysts, for which no specific further follow-up or characterization is required. No obvious calculi or hydronephrosis. Stomach/Bowel: Stomach is within normal limits. No evidence of bowel wall thickening, distention, or inflammatory changes. Vascular/Lymphatic: Aortic atherosclerosis. No enlarged abdominal lymph nodes. Other: No abdominal wall hernia or abnormality. No ascites. Musculoskeletal: No acute or significant osseous findings. IMPRESSION: 1. Examination is generally limited by breath motion artifact, particularly multiphasic contrast enhanced sequences. 2. Within this limitation, arterially enhancing exophytic mass arising from the peripheral inferior pole of the left kidney measuring 1.7 x 1.4 cm, consistent with a small renal cell carcinoma. 3. No evidence of obvious renal vein invasion, lymphadenopathy, or metastatic disease in the abdomen. 4. Numerous fluid signal cystic lesions scattered throughout the pancreas, largest in the pancreatic neck measuring 1.2 x 0.9 cm. Appearance not obviously changed compared to prior noncontrast examination dated 11/05/2021. No solid component or suspicious contrast enhancement. 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 As there is no observed increased risk of malignancy for  such lesions smaller than 2 cm, especially given evidence of other primary malignancy, initially established imaging stability and advanced patient age, no specific further follow-up or characterization is required. Aortic Atherosclerosis (ICD10-I70.0). Electronically Signed   By: Jearld Lesch M.D.   On: 10/22/2023 21:35     Medications:    cefTRIAXone (ROCEPHIN)  IV 2 g (10/24/23 1010)    (feeding supplement) PROSource Plus  30 mL Oral TID BM   allopurinol  50 mg Oral Daily   amiodarone  200 mg Oral Daily   Chlorhexidine Gluconate Cloth  6 each Topical Q0600   Chlorhexidine Gluconate Cloth  6 each Topical Q0600   insulin aspart  0-15 Units Subcutaneous TID WC   insulin aspart  0-5 Units Subcutaneous QHS   insulin glargine-yfgn  5 Units Subcutaneous Daily   linagliptin  5 mg Oral Daily   metoprolol succinate  12.5 mg Oral BID   multivitamin  1 tablet Oral QHS   mupirocin ointment  1 Application Nasal BID   Rivaroxaban  15 mg Oral Q supper   sodium chloride flush  10 mL Intravenous Q12H   sodium chloride flush  3 mL Intravenous Q12H   acetaminophen, HYDROcodone-acetaminophen, ondansetron (ZOFRAN) IV, polyethylene glycol, sodium chloride flush  Assessment/ Plan:  [name] is a 89 y.o.  female with past medical history including chronic heart failure, A-fib with recent cardioversion on Xarelto, CAD, and chronic kidney disease stage IV-5, who was admitted to [facility] on 10/19/2023 for CHF (congestive heart failure) (HCC) [I50.9] Bilateral leg pain [M79.604, M79.605] AKI (acute kidney injury) (HCC) [N17.9] Cellulitis, unspecified cellulitis site [L03.90]   End stage renal disease requiring hemodialysis. We  feel with progressive renal function and failed outpatient diuretic therapy, patient is now deemed end stage renal disease.   Appreciate vascular surgery placing rt chest permcath on 10/22/23. Initial dialysis treatment received after placement.  Patient will receive third  dialysis treatment tomorrow, seated in chair.  Renal navigator seeking outpatient dialysis clinic.   Lab Results  Component Value Date   CREATININE 1.93 (H) 10/24/2023   CREATININE 2.31 (H) 10/23/2023   CREATININE 2.90 (H) 10/22/2023    Intake/Output Summary (Last 24 hours) at 10/24/2023 1226 Last data filed at 10/24/2023 0000 Gross per 24 hour  Intake 0 ml  Output 650 ml  Net -650 ml   2.  Acute on chronic combined heart failure.  Last echo on 11/06/2021 shows EF 30 to 35% with a grade 2 diastolic dysfunction, moderate mitral valve regurgitation.    Fluid volume managed with dialysis.   3. Diabetes mellitus type II with chronic kidney disease/renal manifestations: insulin dependent. Home regimen includes Lantus, NovoLog and glimepiride. Most recent hemoglobin A1c is 7.4 on 08/27/22.   Primary team to manage sliding scale insulin.  4.  Left kidney renal mass, seen on MRI abdomen with/without contrast measuring 1.7 to 1.4 cm, consistent with small renal cell carcinoma.  Recommend follow-up with urology outpatient.   LOS: 5 Tiffany [name] 10/27/202412:26 PM

## 2023-10-24 NOTE — Plan of Care (Signed)

## 2023-10-25 ENCOUNTER — Encounter: Payer: Self-pay | Admitting: Vascular Surgery

## 2023-10-25 ENCOUNTER — Other Ambulatory Visit (HOSPITAL_COMMUNITY): Payer: Self-pay

## 2023-10-25 DIAGNOSIS — E0800 Diabetes mellitus due to underlying condition with hyperosmolarity without nonketotic hyperglycemic-hyperosmolar coma (NKHHC): Secondary | ICD-10-CM | POA: Diagnosis not present

## 2023-10-25 DIAGNOSIS — M79604 Pain in right leg: Secondary | ICD-10-CM | POA: Diagnosis not present

## 2023-10-25 DIAGNOSIS — N179 Acute kidney failure, unspecified: Secondary | ICD-10-CM | POA: Diagnosis not present

## 2023-10-25 DIAGNOSIS — L0390 Cellulitis, unspecified: Secondary | ICD-10-CM | POA: Diagnosis not present

## 2023-10-25 LAB — CBC WITH DIFFERENTIAL/PLATELET
Abs Immature Granulocytes: 0.37 10*3/uL — ABNORMAL HIGH (ref 0.00–0.07)
Basophils Absolute: 0 10*3/uL (ref 0.0–0.1)
Basophils Relative: 0 %
Eosinophils Absolute: 0 10*3/uL (ref 0.0–0.5)
Eosinophils Relative: 0 %
HCT: 32.8 % — ABNORMAL LOW (ref 36.0–46.0)
Hemoglobin: 10.5 g/dL — ABNORMAL LOW (ref 12.0–15.0)
Immature Granulocytes: 5 %
Lymphocytes Relative: 9 %
Lymphs Abs: 0.7 10*3/uL (ref 0.7–4.0)
MCH: 26.8 pg (ref 26.0–34.0)
MCHC: 32 g/dL (ref 30.0–36.0)
MCV: 83.7 fL (ref 80.0–100.0)
Monocytes Absolute: 0.2 10*3/uL (ref 0.1–1.0)
Monocytes Relative: 2 %
Neutro Abs: 6.7 10*3/uL (ref 1.7–7.7)
Neutrophils Relative %: 84 %
Platelets: 201 10*3/uL (ref 150–400)
RBC: 3.92 MIL/uL (ref 3.87–5.11)
RDW: 15.5 % (ref 11.5–15.5)
WBC: 8 10*3/uL (ref 4.0–10.5)
nRBC: 0 % (ref 0.0–0.2)

## 2023-10-25 LAB — GLUCOSE, CAPILLARY
Glucose-Capillary: 163 mg/dL — ABNORMAL HIGH (ref 70–99)
Glucose-Capillary: 213 mg/dL — ABNORMAL HIGH (ref 70–99)
Glucose-Capillary: 316 mg/dL — ABNORMAL HIGH (ref 70–99)

## 2023-10-25 LAB — BASIC METABOLIC PANEL
Anion gap: 11 (ref 5–15)
BUN: 48 mg/dL — ABNORMAL HIGH (ref 8–23)
CO2: 25 mmol/L (ref 22–32)
Calcium: 8.3 mg/dL — ABNORMAL LOW (ref 8.9–10.3)
Chloride: 97 mmol/L — ABNORMAL LOW (ref 98–111)
Creatinine, Ser: 2.61 mg/dL — ABNORMAL HIGH (ref 0.44–1.00)
GFR, Estimated: 17 mL/min — ABNORMAL LOW (ref >60)
Glucose, Bld: 217 mg/dL — ABNORMAL HIGH (ref 70–99)
Potassium: 4.6 mmol/L (ref 3.5–5.1)
Sodium: 133 mmol/L — ABNORMAL LOW (ref 135–145)

## 2023-10-25 MED ORDER — ACETAMINOPHEN 500 MG PO TABS
ORAL_TABLET | ORAL | Status: AC
  Filled 2023-10-25: qty 1

## 2023-10-25 MED ORDER — SODIUM CHLORIDE 0.9 % IV SOLN
2.0000 g | INTRAVENOUS | Status: DC
  Filled 2023-10-25: qty 20

## 2023-10-25 MED ORDER — HEPARIN SODIUM (PORCINE) 1000 UNIT/ML IJ SOLN
INTRAMUSCULAR | Status: AC
  Filled 2023-10-25: qty 10

## 2023-10-25 MED ORDER — APIXABAN 2.5 MG PO TABS
2.5000 mg | ORAL_TABLET | Freq: Two times a day (BID) | ORAL | Status: DC
  Administered 2023-10-25 – 2023-10-28 (×6): 2.5 mg via ORAL
  Filled 2023-10-25 (×6): qty 1

## 2023-10-25 NOTE — Progress Notes (Signed)
Patient ID: Tiffany [name], female   DOB: Nov 12, 1933, 89 y.o.   MRN: [id number]  Subjective: The patient notes moderate improvement in her right knee symptoms following the steroid injection administered yesterday.  She feels that she is able to move her knee more freely, but has not yet tried to put any weight on it.  She has no new complaints pertaining to her right knee.   Objective: Vital signs in last 24 hours: Temp:  [97.5 F (36.4 C)-98.6 F (37 C)] 97.5 F (36.4 C) (10/28 0408) Pulse Rate:  [68-85] 69 (10/28 0408) Resp:  [16-28] 22 (10/28 0749) BP: (118-146)/(62-80) 143/80 (10/28 0408) SpO2:  [93 %-100 %] 99 % (10/28 0408)  Intake/Output from previous day: 10/27 0701 - 10/28 0700 In: 10 [I.V.:10] Out: -  Intake/Output this shift: No intake/output data recorded.  Recent Labs    10/23/23 0338 10/24/23 0332 10/25/23 0338  HGB 10.5* 9.5* 10.5*   Recent Labs    10/24/23 0332 10/25/23 0338  WBC 8.6 8.0  RBC 3.56* 3.92  HCT 30.7* 32.8*  PLT 167 201   Recent Labs    10/24/23 0332 10/25/23 0338  NA 135 133*  K 3.3* 4.6  CL 99 97*  CO2 28 25  BUN 32* 48*  CREATININE 1.93* 2.61*  GLUCOSE 130* 217*  CALCIUM 8.0* 8.3*   No results for input(s): "LABPT", "INR" in the last 72 hours.  Physical Exam: Orthopedic examination again is limited to the right knee and lower extremity.  The patient is able to flex her knee in bed to about 40 degrees, which she feels is substantially improved as compared to yesterday.  She has less tenderness to palpation along the medial and lateral aspects of her knee as well.  Her neurologic and vascular status is unchanged as compared to yesterday.  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  Plan: The treatment options have been reviewed with the patient.  She is pleased with her symptomatic and functional improvement to this point.  She may be mobilized with physical therapy, weightbearing as tolerated on the right  leg.  Thank you for asked me to participate in the care of this most pleasant yet unfortunate woman.  I will sign off at this time.  She may follow-up in my office on an as necessary basis.  Please reconsult me if there is further need of orthopedic input during this hospitalization.   Excell Seltzer Paislynn Hegstrom 10/25/2023, 7:53 AM

## 2023-10-25 NOTE — Progress Notes (Signed)
PT Cancellation Note  Patient Details Name: Tiffany [name] MRN: [id number] DOB: 01/15/1933   Cancelled Treatment:    Reason Eval/Treat Not Completed: Other (comment)  Pt recently returned from dialysis and not ready to get OOB yet.  She sated her knee is better.  Discussed with tech that pt would like to get up earlier.  She stated she does want to go to SNF at [facility] (resident there) and relayed to [facility].     Danielle Dess 10/25/2023, 1:08 PM

## 2023-10-25 NOTE — Progress Notes (Signed)
Received patient in bed to unit.  Alert and oriented.  Informed consent signed and in chart.   TX duration:3.5 hours  Patient tolerated well.  Transported back to the room  Alert, without acute distress.  Hand-off given to patient's nurse.   Access used: Dialysis Catheter Access issues: None  Total UF removed: 2L Medication(s) given: Acetaminophen 500 mg Post HD VS: 121/61 Post HD weight: 83.2kg   Mitsy Owen Geomerrene M Tirza [facility] UnitReceived patient in bed to unit.

## 2023-10-25 NOTE — Progress Notes (Signed)
Progress Note    10/25/2023 1:30 PM 3 Days Post-Op  Subjective:  Tiffany [name] is an 89 yo  female now POD #3 from Dialysis Perma Catheter placement. Patient resting comfortably in bed eating lunch. No complaints over night and vitals all remain stable. Dialysis access in place and sore to touch but endorses it is tolerable.   Vitals:   10/25/23 1218 10/25/23 1251  BP: 121/61 113/61  Pulse: 80 81  Resp: 18 20  Temp: 97.8 F (36.6 C) 98.2 F (36.8 C)  SpO2: 99% 97%   Physical Exam: Cardiac:  Regular rate and rhythm, no murmurs / rubs / gallops.  Lungs:  clear to auscultation bilaterally normal respiratory effort.  Incisions:  Right chest incision with erythema. No hematoma or seroma to note.  Extremities:  Palpable pulses throughout Abdomen:  Positive bowel sounds throughout, soft non tender and non distended. Neurologic: AAOX4, answers questions and follows commands appropriately.   CBC    Component Value Date/Time   WBC 8.0 10/25/2023 0338   RBC 3.92 10/25/2023 0338   HGB 10.5 (L) 10/25/2023 0338   HGB 13.8 01/02/2021 1507   HCT 32.8 (L) 10/25/2023 0338   HCT 41.8 01/02/2021 1507   PLT 201 10/25/2023 0338   PLT 313 01/02/2021 1507   MCV 83.7 10/25/2023 0338   MCV 85 01/02/2021 1507   MCV 83 04/21/2012 0801   MCH 26.8 10/25/2023 0338   MCHC 32.0 10/25/2023 0338   RDW 15.5 10/25/2023 0338   RDW 13.3 01/02/2021 1507   RDW 15.6 (H) 04/21/2012 0801   LYMPHSABS 0.7 10/25/2023 0338   LYMPHSABS 2.8 01/02/2021 1507   LYMPHSABS 2.5 04/21/2012 0801   MONOABS 0.2 10/25/2023 0338   MONOABS 0.5 04/21/2012 0801   EOSABS 0.0 10/25/2023 0338   EOSABS 0.3 01/02/2021 1507   EOSABS 0.3 04/21/2012 0801   BASOSABS 0.0 10/25/2023 0338   BASOSABS 0.1 01/02/2021 1507   BASOSABS 0.1 04/21/2012 0801    BMET    Component Value Date/Time   NA 133 (L) 10/25/2023 0338   NA 141 11/12/2021 1504   NA 140 04/20/2012 0543   K 4.6 10/25/2023 0338   K 3.5 04/20/2012 0543   CL 97 (L)  10/25/2023 0338   CL 106 04/20/2012 0543   CO2 25 10/25/2023 0338   CO2 26 04/20/2012 0543   GLUCOSE 217 (H) 10/25/2023 0338   GLUCOSE 163 (H) 04/20/2012 0543   BUN 48 (H) 10/25/2023 0338   BUN 45 (H) 11/12/2021 1504   BUN 19 (H) 04/20/2012 0543   CREATININE 2.61 (H) 10/25/2023 0338   CREATININE 0.86 04/20/2012 0543   CALCIUM 8.3 (L) 10/25/2023 0338   CALCIUM 8.7 04/20/2012 0543   GFRNONAA 17 (L) 10/25/2023 0338   GFRNONAA >60 04/20/2012 0543   GFRAA 18 (L) 01/02/2021 1425   GFRAA >60 04/20/2012 0543    INR    Component Value Date/Time   INR 2.6 (H) 11/06/2021 0411   INR 0.9 04/19/2012 1303     Intake/Output Summary (Last 24 hours) at 10/25/2023 1330 Last data filed at 10/25/2023 1218 Gross per 24 hour  Intake 10 ml  Output 2000 ml  Net -1990 ml     Assessment/Plan:  89 y.o. female is s/p Dialysis Perma Catheter Placement.  3 Days Post-Op   PLAN: Per vascular surgery placement of dialysis access is successful. Catheter working well for hemodialysis. Vascular surgery signs off today. Reconsult if needed. Thank You  DVT prophylaxis:  Heparin with dialysis  [facility] 10/25/2023 1:30 PM

## 2023-10-25 NOTE — Plan of Care (Signed)
  Problem: Education: Goal: Ability to describe self-care measures that may prevent or decrease complications (Diabetes Survival Skills Education) will improve Outcome: Progressing Goal: Individualized Educational Video(s) Outcome: Progressing   Problem: Coping: Goal: Ability to adjust to condition or change in health will improve Outcome: Progressing   Problem: Fluid Volume: Goal: Ability to maintain a balanced intake and output will improve Outcome: Progressing   Problem: Health Behavior/Discharge Planning: Goal: Ability to identify and utilize available resources and services will improve Outcome: Progressing Goal: Ability to manage health-related needs will improve Outcome: Progressing   Problem: Metabolic: Goal: Ability to maintain appropriate glucose levels will improve Outcome: Progressing   Problem: Nutritional: Goal: Maintenance of adequate nutrition will improve Outcome: Progressing Goal: Progress toward achieving an optimal weight will improve Outcome: Progressing   Problem: Skin Integrity: Goal: Risk for impaired skin integrity will decrease Outcome: Progressing   Problem: Tissue Perfusion: Goal: Adequacy of tissue perfusion will improve Outcome: Progressing   Problem: Education: Goal: Knowledge of General Education information will improve Description: Including pain rating scale, medication(s)/side effects and non-pharmacologic comfort measures Outcome: Progressing   Problem: Health Behavior/Discharge Planning: Goal: Ability to manage health-related needs will improve Outcome: Progressing   Problem: Clinical Measurements: Goal: Ability to maintain clinical measurements within normal limits will improve Outcome: Progressing Goal: Will remain free from infection Outcome: Progressing Goal: Diagnostic test results will improve Outcome: Progressing Goal: Respiratory complications will improve Outcome: Progressing Goal: Cardiovascular complication will  be avoided Outcome: Progressing   Problem: Activity: Goal: Risk for activity intolerance will decrease Outcome: Progressing   Problem: Nutrition: Goal: Adequate nutrition will be maintained Outcome: Progressing   Problem: Coping: Goal: Level of anxiety will decrease Outcome: Progressing   Problem: Elimination: Goal: Will not experience complications related to bowel motility Outcome: Progressing Goal: Will not experience complications related to urinary retention Outcome: Progressing   Problem: Pain Management: Goal: General experience of comfort will improve Outcome: Progressing   Problem: Safety: Goal: Ability to remain free from injury will improve Outcome: Progressing   Problem: Skin Integrity: Goal: Risk for impaired skin integrity will decrease Outcome: Progressing   Problem: Education: Goal: Knowledge of disease and its progression will improve Outcome: Progressing   Problem: Fluid Volume: Goal: Compliance with measures to maintain balanced fluid volume will improve Outcome: Progressing   Problem: Health Behavior/Discharge Planning: Goal: Ability to manage health-related needs will improve Outcome: Progressing   Problem: Nutritional: Goal: Ability to make healthy dietary choices will improve Outcome: Progressing   Problem: Clinical Measurements: Goal: Complications related to the disease process, condition or treatment will be avoided or minimized Outcome: Progressing

## 2023-10-25 NOTE — [place] FL2 (Signed)
[place] MEDICAID FL2 LEVEL OF CARE FORM     IDENTIFICATION  Patient Name: Tiffany [name] Birthdate: 12/24/1933 Sex: female Admission Date (Current Location): 10/19/2023  [place] and [place] Number:  Chiropodist and Address:  [facility], [address], [place], [place] [address]      Provider Number: [id number]  Attending Physician Name and Address:  Loyce Dys, MD  Relative Name and Phone Number:  Strothers,DR. [name] (Spouse)  [phone] (Mobile)    Current Level of Care: Hospital Recommended Level of Care: Skilled Nursing Facility Prior Approval Number:    Date Approved/Denied:   PASRR Number: [id number]  Discharge Plan: Home    Current Diagnoses: Patient Active Problem List   Diagnosis Date Noted   Cellulitis 10/19/2023   AKI (acute kidney injury) (HCC) 10/19/2023   CHF (congestive heart failure) (HCC) 10/19/2023   Ventral hernia with obstruction and without gangrene 08/27/2022   Acute on chronic congestive heart failure (HCC)    Atrial fibrillation, chronic (HCC)    Ventral hernia without obstruction or gangrene    Hydronephrosis with ureteral stricture, not elsewhere classified    Acute on chronic systolic CHF (congestive heart failure) (HCC) 11/05/2021   Status post total knee replacement using cement, left 05/23/2019   Chronic pain of left knee 02/08/2019   Primary osteoarthritis of left knee 02/08/2019   Derangement of lateral meniscus, left 02/08/2019   Chronic pain syndrome 02/08/2019    Orientation RESPIRATION BLADDER Height & Weight     Self, Time, Situation, Place  O2 Incontinent Weight: 83.2 kg (Bed scale) Height:  5\' 3"  (160 cm)  BEHAVIORAL SYMPTOMS/MOOD NEUROLOGICAL BOWEL NUTRITION STATUS  Other (Comment) (n/a)  (n/a) Incontinent Diet (Carb Modified)  AMBULATORY STATUS COMMUNICATION OF NEEDS Skin   Limited Assist Verbally Other (Comment) (skin tear buttocks)                       Personal  Care Assistance Level of Assistance  Bathing, Dressing Bathing Assistance: Limited assistance   Dressing Assistance: Limited assistance     Functional Limitations Info             SPECIAL CARE FACTORS FREQUENCY  PT (By licensed PT), OT (By licensed OT)     PT Frequency: Min 2x weekly OT Frequency: Min 2x weekly            Contractures Contractures Info: Not present    Additional Factors Info  Code Status Code Status Info: FULL             Current Medications (10/25/2023):  This is the current hospital active medication list Current Facility-Administered Medications  Medication Dose Route Frequency Provider Last Rate Last Admin   (feeding supplement) PROSource Plus liquid 30 mL  30 mL Oral TID BM Rosezetta Schlatter T, MD   30 mL at 10/25/23 1349   acetaminophen (TYLENOL) tablet 500 mg  500 mg Oral Q6H PRN Manuela Schwartz, NP   500 mg at 10/25/23 1000   allopurinol (ZYLOPRIM) tablet 50 mg  50 mg Oral Daily Schnier, Latina Craver, MD   50 mg at 10/25/23 1258   amiodarone (PACERONE) tablet 200 mg  200 mg Oral Daily Schnier, Latina Craver, MD   200 mg at 10/25/23 1258   apixaban (ELIQUIS) tablet 2.5 mg  2.5 mg Oral BID Rosezetta Schlatter T, MD       bupivacaine(PF) (MARCAINE) 0.5 % injection 10 mL  10 mL Infiltration Once Leron Croak  J, MD       [START ON 10/26/2023] cefTRIAXone (ROCEPHIN) 2 g in sodium chloride 0.9 % 100 mL IVPB  2 g Intravenous Q24H Loyce Dys, MD       Chlorhexidine Gluconate Cloth 2 % PADS 6 each  6 each Topical Q0600 Schnier, Latina Craver, MD   6 each at 10/25/23 1437   Chlorhexidine Gluconate Cloth 2 % PADS 6 each  6 each Topical Q0600 Schnier, Latina Craver, MD   6 each at 10/25/23 0419   HYDROcodone-acetaminophen (NORCO/VICODIN) 5-325 MG per tablet 1 tablet  1 tablet Oral Q6H PRN Renford Dills, MD   1 tablet at 10/23/23 1133   insulin aspart (novoLOG) injection 0-15 Units  0-15 Units Subcutaneous TID WC Schnier, Latina Craver, MD   4 Units at 10/25/23 1302   insulin  aspart (novoLOG) injection 0-5 Units  0-5 Units Subcutaneous QHS Renford Dills, MD   2 Units at 10/24/23 2157   insulin glargine-yfgn [facility]) injection 5 Units  5 Units Subcutaneous Daily Schnier, Latina Craver, MD   5 Units at 10/25/23 1302   linagliptin (TRADJENTA) tablet 5 mg  5 mg Oral Daily Schnier, Latina Craver, MD   5 mg at 10/25/23 1259   metoprolol succinate (TOPROL-XL) 24 hr tablet 12.5 mg  12.5 mg Oral BID Renford Dills, MD   12.5 mg at 10/25/23 1258   multivitamin (RENA-VIT) tablet 1 tablet  1 tablet Oral QHS Loyce Dys, MD   1 tablet at 10/24/23 2156   mupirocin ointment (BACTROBAN) 2 % 1 Application  1 Application Nasal BID Renford Dills, MD   1 Application at 10/25/23 1311   ondansetron (ZOFRAN) injection 4 mg  4 mg Intravenous Q6H PRN Schnier, Latina Craver, MD       polyethylene glycol (MIRALAX / GLYCOLAX) packet 8.5-17 g  8.5-17 g Oral Daily PRN Renford Dills, MD   17 g at 10/23/23 2337   sodium chloride flush (NS) 0.9 % injection 10 mL  10 mL Intravenous Q12H Schnier, Latina Craver, MD   10 mL at 10/25/23 1320   sodium chloride flush (NS) 0.9 % injection 3 mL  3 mL Intravenous Q12H Schnier, Latina Craver, MD   3 mL at 10/25/23 1321   sodium chloride flush (NS) 0.9 % injection 3 mL  3 mL Intravenous PRN Schnier, Latina Craver, MD       triamcinolone acetonide (KENALOG-40) injection 80 mg  80 mg Intra-articular Once Poggi, Excell Seltzer, MD         Discharge Medications: Please see discharge summary for a list of discharge medications.  Relevant Imaging Results:  Relevant Lab Results:   Additional Information [id number]  Truddie Hidden, RN

## 2023-10-25 NOTE — TOC Benefit Eligibility Note (Signed)
Patient Product/process development scientist completed.    The patient is insured through [employer]/ [employer]. Patient has Medicare and is not eligible for a copay card, but may be able to apply for patient assistance, if available.    Ran test claim for Eliquis 5 mg and the current 30 day co-pay is $142.54 due to being in Coverage Gap (donut hole).   This test claim was processed through [facility]- copay amounts may vary at other pharmacies due to pharmacy/plan contracts, or as the patient moves through the different stages of their insurance plan.     Roland Earl, CPHT Pharmacy Technician III Certified Patient Advocate [facility] Patient Advocate Team Direct Number: [phone]  Fax: [phone]

## 2023-10-25 NOTE — TOC Progression Note (Signed)
Transition of Care [facility]) - Progression Note    Patient Details  Name: Tiffany [name] MRN: [id number] Date of Birth: 05-09-1933  Transition of [facility] CM/SW Contact  Truddie Hidden, RN Phone Number: 10/25/2023, 4:07 PM  Clinical Narrative:    Spoke with patient's spouse at bedside regarding discharge plan. Dr. Montez Morita stated therapy wanted patient to go to SNF. He was advised patient initially refused.  He stated he had spoken with Sue Lush, Admissions Coordinator at [facility] and they would be able to accept her. He was advised [facility] bed for their resident and Berkley Harvey is still required prior to admissions.  FL2 completed. Referral sent to [facility] in admissions.          Expected Discharge Plan and Services                                               Social Determinants of Health (SDOH) Interventions SDOH Screenings   Food Insecurity: No Food Insecurity (10/19/2023)  Housing: Low Risk  (10/19/2023)  Transportation Needs: No Transportation Needs (10/19/2023)  Utilities: Not At Risk (10/19/2023)  Depression (PHQ2-9): Low Risk  (02/20/2019)  Financial Resource Strain: Low Risk  (09/29/2023)   Received from [facility]  Tobacco Use: Low Risk  (10/19/2023)    Readmission Risk Interventions     No data to display

## 2023-10-25 NOTE — Progress Notes (Signed)
[facility]  ROUNDING NOTE   Subjective:   Patient seen and evaluated during dialysis   HEMODIALYSIS FLOWSHEET:  Blood Flow Rate (mL/min): 399 mL/min Arterial Pressure (mmHg): -158.78 mmHg Venous Pressure (mmHg): 153.32 mmHg TMP (mmHg): 0 mmHg Ultrafiltration Rate (mL/min): 829 mL/min Dialysate Flow Rate (mL/min): 299 ml/min  Tolerating treatment well Patient states she was able to sit in the chair about 2 hours yesterday Patient encouraged to sit in the chair this afternoon.   Objective:  Vital signs in last 24 hours:  Temp:  [97.5 F (36.4 C)-98.6 F (37 C)] 98.5 F (36.9 C) (10/28 0833) Pulse Rate:  [66-85] 66 (10/28 0840) Resp:  [16-28] 21 (10/28 0840) BP: (118-146)/(62-80) 139/69 (10/28 0840) SpO2:  [93 %-100 %] 100 % (10/28 0840) Weight:  [85.6 kg] 85.6 kg (10/28 0833)  Weight change:  Filed Weights   10/23/23 0753 10/23/23 1039 10/25/23 0833  Weight: 87 kg 85.6 kg 85.6 kg    Intake/Output: I/O last 3 completed shifts: In: 10 [I.V.:10] Out: 650 [Urine:650]   Intake/Output this shift:  No intake/output data recorded.  Physical Exam: General: NAD  Head: Normocephalic, atraumatic. Moist oral mucosal membranes  Eyes: Anicteric  Lungs:  Clear to auscultation  Heart: Regular rate and rhythm  Abdomen:  Soft, nontender,   Extremities: 1+ peripheral edema.  Neurologic: Alert, moving all four extremities  Skin: BLE erythema (Ace wrapped)  ACCESS Rt Chest permcath placed on 10/22/23    Basic Metabolic Panel: Recent Labs  Lab 10/21/23 0505 10/22/23 0353 10/23/23 0338 10/24/23 0332 10/25/23 0338  NA 136 138 135 135 133*  K 3.6 3.5 3.6 3.3* 4.6  CL 100 100 96* 99 97*  CO2 25 28 26 28 25   GLUCOSE 42* 47* 275* 130* 217*  BUN 83* 75* 50* 32* 48*  CREATININE 3.21* 2.90* 2.31* 1.93* 2.61*  CALCIUM 8.3* 8.4* 8.1* 8.0* 8.3*    Liver Function Tests: No results for input(s): "AST", "ALT", "ALKPHOS", "BILITOT", "PROT", "ALBUMIN" in the last  168 hours. No results for input(s): "LIPASE", "AMYLASE" in the last 168 hours. No results for input(s): "AMMONIA" in the last 168 hours.  CBC: Recent Labs  Lab 10/21/23 0505 10/22/23 0353 10/23/23 0338 10/24/23 0332 10/25/23 0338  WBC 14.0* 11.5* 9.9 8.6 8.0  NEUTROABS 12.5* 9.5* 8.0* 5.8 6.7  HGB 9.7* 9.4* 10.5* 9.5* 10.5*  HCT 29.7* 29.3* 33.9* 30.7* 32.8*  MCV 83.2 84.4 86.7 86.2 83.7  PLT 166 163 169 167 201    Cardiac Enzymes: Recent Labs  Lab 10/19/23 0827  CKTOTAL 16*    BNP: Invalid input(s): "POCBNP"  CBG: Recent Labs  Lab 10/23/23 2135 10/24/23 0838 10/24/23 1136 10/24/23 1806 10/24/23 2147  GLUCAP 177* 101* 183* 365* 244*    Microbiology: Results for orders placed or performed during the hospital encounter of 10/19/23  Blood culture (routine x 2)     Status: Abnormal   Collection Time: 10/19/23  9:10 AM   Specimen: BLOOD  Result Value Ref Range Status   Specimen Description   Final    BLOOD RIGHT WRIST Performed at [facility], [address] [place], [place] [address]    Special Requests   Final    BOTTLES DRAWN AEROBIC AND ANAEROBIC Blood Culture adequate volume Performed at [facility], [address] [place], [place] [address]    Culture  Setup Time   Final    Organism ID to follow GRAM NEGATIVE RODS ANAEROBIC BOTTLE ONLY CRITICAL RESULT CALLED TO, READ  BACK BY AND VERIFIED WITH: JASON ROBBINS PHARMD @0000  10/20/23 ASW Performed at [facility], [address] [place], [place] [address]    Culture KLEBSIELLA OXYTOCA (A)  Final   Report Status 10/22/2023 FINAL  Final   Organism ID, Bacteria KLEBSIELLA OXYTOCA  Final      Susceptibility   Klebsiella oxytoca - MIC*    AMPICILLIN >=32 RESISTANT Resistant     CEFEPIME <=0.12 SENSITIVE Sensitive     CEFTAZIDIME <=1 SENSITIVE Sensitive     CEFTRIAXONE <=0.25 SENSITIVE Sensitive     CIPROFLOXACIN <=0.25 SENSITIVE Sensitive     GENTAMICIN <=1 SENSITIVE  Sensitive     IMIPENEM <=0.25 SENSITIVE Sensitive     TRIMETH/SULFA <=20 SENSITIVE Sensitive     AMPICILLIN/SULBACTAM 8 SENSITIVE Sensitive     PIP/TAZO <=4 SENSITIVE Sensitive ug/mL    * KLEBSIELLA OXYTOCA  Blood Culture ID Panel (Reflexed)     Status: Abnormal   Collection Time: 10/19/23  9:10 AM  Result Value Ref Range Status   Enterococcus faecalis NOT DETECTED NOT DETECTED Final   Enterococcus Faecium NOT DETECTED NOT DETECTED Final   Listeria monocytogenes NOT DETECTED NOT DETECTED Final   Staphylococcus species NOT DETECTED NOT DETECTED Final   Staphylococcus aureus (BCID) NOT DETECTED NOT DETECTED Final   Staphylococcus epidermidis NOT DETECTED NOT DETECTED Final   Staphylococcus lugdunensis NOT DETECTED NOT DETECTED Final   Streptococcus species NOT DETECTED NOT DETECTED Final   Streptococcus agalactiae NOT DETECTED NOT DETECTED Final   Streptococcus pneumoniae NOT DETECTED NOT DETECTED Final   Streptococcus pyogenes NOT DETECTED NOT DETECTED Final   A.calcoaceticus-baumannii NOT DETECTED NOT DETECTED Final   Bacteroides fragilis NOT DETECTED NOT DETECTED Final   Enterobacterales DETECTED (A) NOT DETECTED Final    Comment: Enterobacterales represent a large order of gram negative bacteria, not a single organism. CRITICAL RESULT CALLED TO, READ BACK BY AND VERIFIED WITH: JASON ROBBINS PHARMD @0000  10/20/23 ASW    Enterobacter cloacae complex NOT DETECTED NOT DETECTED Final   Escherichia coli NOT DETECTED NOT DETECTED Final   Klebsiella aerogenes NOT DETECTED NOT DETECTED Final   Klebsiella oxytoca DETECTED (A) NOT DETECTED Final    Comment: CRITICAL RESULT CALLED TO, READ BACK BY AND VERIFIED WITH: JASON ROBBINS PHARMD @0000  10/20/23 ASW    Klebsiella pneumoniae NOT DETECTED NOT DETECTED Final   Proteus species NOT DETECTED NOT DETECTED Final   Salmonella species NOT DETECTED NOT DETECTED Final   Serratia marcescens NOT DETECTED NOT DETECTED Final   Haemophilus  influenzae NOT DETECTED NOT DETECTED Final   Neisseria meningitidis NOT DETECTED NOT DETECTED Final   Pseudomonas aeruginosa NOT DETECTED NOT DETECTED Final   Stenotrophomonas maltophilia NOT DETECTED NOT DETECTED Final   Candida albicans NOT DETECTED NOT DETECTED Final   Candida auris NOT DETECTED NOT DETECTED Final   Candida glabrata NOT DETECTED NOT DETECTED Final   Candida krusei NOT DETECTED NOT DETECTED Final   Candida parapsilosis NOT DETECTED NOT DETECTED Final   Candida tropicalis NOT DETECTED NOT DETECTED Final   Cryptococcus neoformans/gattii NOT DETECTED NOT DETECTED Final   CTX-M ESBL NOT DETECTED NOT DETECTED Final   Carbapenem resistance IMP NOT DETECTED NOT DETECTED Final   Carbapenem resistance KPC NOT DETECTED NOT DETECTED Final   Carbapenem resistance NDM NOT DETECTED NOT DETECTED Final   Carbapenem resist OXA 48 LIKE NOT DETECTED NOT DETECTED Final   Carbapenem resistance VIM NOT DETECTED NOT DETECTED Final    Comment: Performed at [facility], [address] [place], [place] [address]  Blood culture (routine x 2)     Status: None   Collection Time: 10/19/23 11:45 AM   Specimen: BLOOD  Result Value Ref Range Status   Specimen Description BLOOD LEFT ANTECUBITAL  Final   Special Requests   Final    BOTTLES DRAWN AEROBIC AND ANAEROBIC Blood Culture adequate volume   Culture   Final    NO GROWTH 5 DAYS Performed at [facility], [address], [place], [place] [address]    Report Status 10/24/2023 FINAL  Final  Group A Strep by PCR     Status: None   Collection Time: 10/19/23  4:15 PM   Specimen: Throat; Sterile Swab  Result Value Ref Range Status   Group A Strep by PCR NOT DETECTED NOT DETECTED Final    Comment: Performed at [facility], [address], [place], [place] [address]  MRSA Next Gen by PCR, Nasal     Status: Abnormal   Collection Time: 10/21/23  6:15 PM   Specimen: Nasal Mucosa; Nasal Swab  Result Value Ref Range  Status   MRSA by PCR Next Gen DETECTED (A) NOT DETECTED Final    Comment: RESULT CALLED TO, READ BACK BY AND VERIFIED WITH: YASMINE SORIANO @1935  ON 10/21/23 SKL (NOTE) The GeneXpert MRSA Assay (FDA approved for NASAL specimens only), is one component of a comprehensive MRSA colonization surveillance program. It is not intended to diagnose MRSA infection nor to guide or monitor treatment for MRSA infections. Test performance is not FDA approved in patients less than 71 years old. Performed at [facility], [address] [place], [place] [address]   Culture, blood (Routine X 2) w Reflex to ID Panel     Status: None (Preliminary result)   Collection Time: 10/23/23  3:10 PM   Specimen: BLOOD  Result Value Ref Range Status   Specimen Description BLOOD BLOOD RIGHT HAND  Final   Special Requests   Final    BOTTLES DRAWN AEROBIC AND ANAEROBIC Blood Culture adequate volume   Culture   Final    NO GROWTH 2 DAYS Performed at [facility], [address], [place], [place] [address]    Report Status PENDING  Incomplete  Culture, blood (Routine X 2) w Reflex to ID Panel     Status: None (Preliminary result)   Collection Time: 10/23/23  3:17 PM   Specimen: BLOOD  Result Value Ref Range Status   Specimen Description BLOOD BLOOD LEFT HAND  Final   Special Requests   Final    BOTTLES DRAWN AEROBIC AND ANAEROBIC Blood Culture results may not be optimal due to an excessive volume of blood received in culture bottles   Culture   Final    NO GROWTH 2 DAYS Performed at [facility], [address] [place], [place] [address]    Report Status PENDING  Incomplete    Coagulation Studies: No results for input(s): "LABPROT", "INR" in the last 72 hours.  Urinalysis: No results for input(s): "COLORURINE", "LABSPEC", "PHURINE", "GLUCOSEU", "HGBUR", "BILIRUBINUR", "KETONESUR", "PROTEINUR", "UROBILINOGEN", "NITRITE", "LEUKOCYTESUR" in the last 72 hours.  Invalid input(s):  "APPERANCEUR"    Imaging: ECHOCARDIOGRAM COMPLETE  Result Date: 10/24/2023    ECHOCARDIOGRAM REPORT   Patient Name:   Tiffany [name] Date of Exam: 10/24/2023 Medical Rec #:  [id number]        Height:       63.0 in Accession #:    [id number]  Weight:       188.7 lb Date of Birth:  1933/10/06        BSA:          1.886 m Patient Age:    89 years         BP:           132/65 mmHg Patient Gender: F                HR:           72 bpm. Exam Location:  [facility] Procedure: 2D Echo Indications:     CHF- Acute Systolic I50.21  History:         Patient has prior history of Echocardiogram examinations.  Sonographer:     Elwin Sleight RDCS Referring Phys:  [id number] Loyce Dys Diagnosing Phys: Julien Nordmann MD  Sonographer Comments: Suboptimal parasternal window, suboptimal apical window and suboptimal subcostal window. Image acquisition challenging due to patient body habitus. IMPRESSIONS  1. Left ventricular ejection fraction, by estimation, is 40 to 45%. Left ventricular ejection fraction by PLAX is 42 %. The left ventricle has mildly decreased function. The left ventricle demonstrates global hypokinesis. Left ventricular diastolic parameters are consistent with Grade I diastolic dysfunction (impaired relaxation).  2. Right ventricular systolic function is normal. The right ventricular size is normal. There is moderately elevated pulmonary artery systolic pressure. The estimated right ventricular systolic pressure is 54.6 mmHg.  3. The mitral valve is normal in structure. Mild mitral valve regurgitation. No evidence of mitral stenosis.  4. The aortic valve has an indeterminant number of cusps. Aortic valve regurgitation is not visualized. Aortic valve sclerosis is present, with no evidence of aortic valve stenosis.  5. The inferior vena cava is normal in size with greater than 50% respiratory variability, suggesting right atrial pressure of 3 mmHg. FINDINGS  Left Ventricle: Left ventricular ejection fraction, by  estimation, is 40 to 45%. Left ventricular ejection fraction by PLAX is 42 %. The left ventricle has mildly decreased function. The left ventricle demonstrates global hypokinesis. The left ventricular internal cavity size was normal in size. There is no left ventricular hypertrophy. Left ventricular diastolic parameters are consistent with Grade I diastolic dysfunction (impaired relaxation). Right Ventricle: The right ventricular size is normal. No increase in right ventricular wall thickness. Right ventricular systolic function is normal. There is moderately elevated pulmonary artery systolic pressure. The tricuspid regurgitant velocity is 3.52 m/s, and with an assumed right atrial pressure of 5 mmHg, the estimated right ventricular systolic pressure is 54.6 mmHg. Left Atrium: Left atrial size was normal in size. Right Atrium: Right atrial size was normal in size. Pericardium: There is no evidence of pericardial effusion. Mitral Valve: The mitral valve is normal in structure. Mild mitral valve regurgitation. No evidence of mitral valve stenosis. Tricuspid Valve: The tricuspid valve is normal in structure. Tricuspid valve regurgitation is mild . No evidence of tricuspid stenosis. Aortic Valve: The aortic valve has an indeterminant number of cusps. Aortic valve regurgitation is not visualized. Aortic valve sclerosis is present, with no evidence of aortic valve stenosis. Aortic valve peak gradient measures 8.4 mmHg. Pulmonic Valve: The pulmonic valve was normal in structure. Pulmonic valve regurgitation is not visualized. No evidence of pulmonic stenosis. Aorta: The aortic root is normal in size and structure. Venous: The inferior vena cava is normal in size with greater than 50% respiratory variability, suggesting right atrial pressure of 3 mmHg. IAS/Shunts: No atrial level shunt detected by color flow Doppler.  LEFT VENTRICLE PLAX 2D LV EF:         Left            Diastology                ventricular     LV e'  medial:    4.24 cm/s                ejection        LV E/e' medial:  20.1                fraction by     LV e' lateral:   7.83 cm/s                PLAX is 42      LV E/e' lateral: 10.9                %. LVIDd:         3.75 cm LVIDs:         3.00 cm LV PW:         1.25 cm LV IVS:        1.25 cm LVOT diam:     1.90 cm LV SV:         52 LV SV Index:   28 LVOT Area:     2.84 cm  LV Volumes (MOD) LV vol d, MOD    79.9 ml A4C: LV SV MOD A4C:   79.9 ml RIGHT VENTRICLE RV Basal diam:  3.30 cm RV S prime:     17.00 cm/s TAPSE (M-mode): 2.2 cm LEFT ATRIUM             Index        RIGHT ATRIUM           Index LA diam:        3.40 cm 1.80 cm/m   RA Area:     12.30 cm LA Vol (A2C):   48.4 ml 25.66 ml/m  RA Volume:   26.20 ml  13.89 ml/m LA Vol (A4C):   48.4 ml 25.66 ml/m LA Biplane Vol: 48.7 ml 25.82 ml/m  AORTIC VALVE                 PULMONIC VALVE AV Area (Vmax): 1.98 cm     PV Vmax:        1.44 m/s AV Vmax:        144.50 cm/s  PV Peak grad:   8.2 mmHg AV Peak Grad:   8.4 mmHg     RVOT Peak grad: 4 mmHg LVOT Vmax:      101.00 cm/s LVOT Vmean:     59.100 cm/s LVOT VTI:       0.185 m  AORTA Ao Root diam: 2.50 cm Ao Asc diam:  3.60 cm MITRAL VALVE               TRICUSPID VALVE MV Area (PHT): 3.89 cm    TR Peak grad:   49.6 mmHg MV Decel Time: 195 msec    TR Vmax:        352.00 cm/s MV E velocity: 85.40 cm/s MV A velocity: 93.10 cm/s  SHUNTS MV E/A ratio:  0.92        Systemic VTI:  0.18 m                            Systemic Diam: 1.90 cm Julien Nordmann MD  Electronically signed by Julien Nordmann MD Signature Date/Time: 10/24/2023/9:32:01 AM    Final      Medications:    cefTRIAXone (ROCEPHIN)  IV 2 g (10/24/23 1010)    (feeding supplement) PROSource Plus  30 mL Oral TID BM   allopurinol  50 mg Oral Daily   amiodarone  200 mg Oral Daily   bupivacaine(PF)  10 mL Infiltration Once   Chlorhexidine Gluconate Cloth  6 each Topical Q0600   Chlorhexidine Gluconate Cloth  6 each Topical Q0600   insulin aspart  0-15  Units Subcutaneous TID WC   insulin aspart  0-5 Units Subcutaneous QHS   insulin glargine-yfgn  5 Units Subcutaneous Daily   linagliptin  5 mg Oral Daily   metoprolol succinate  12.5 mg Oral BID   multivitamin  1 tablet Oral QHS   mupirocin ointment  1 Application Nasal BID   Rivaroxaban  15 mg Oral Q supper   sodium chloride flush  10 mL Intravenous Q12H   sodium chloride flush  3 mL Intravenous Q12H   triamcinolone acetonide  80 mg Intra-articular Once   acetaminophen, HYDROcodone-acetaminophen, ondansetron (ZOFRAN) IV, polyethylene glycol, sodium chloride flush  Assessment/ Plan:  Tiffany [name] is a 89 y.o.  female with past medical history including chronic heart failure, A-fib with recent cardioversion on Xarelto, CAD, and chronic kidney disease stage IV-5, who was admitted to [facility] on 10/19/2023 for CHF (congestive heart failure) (HCC) [I50.9] Bilateral leg pain [M79.604, M79.605] AKI (acute kidney injury) (HCC) [N17.9] Cellulitis, unspecified cellulitis site [L03.90]   End stage renal disease requiring hemodialysis. We feel with progressive renal function and failed outpatient diuretic therapy, patient is now deemed end stage renal disease.   Appreciate vascular surgery placing rt chest permcath on 10/22/23 and first treatment followed. Patient receiving third treatment today. Next treatment scheduled for Wednesday.   Lab Results  Component Value Date   CREATININE 2.61 (H) 10/25/2023   CREATININE 1.93 (H) 10/24/2023   CREATININE 2.31 (H) 10/23/2023    Intake/Output Summary (Last 24 hours) at 10/25/2023 1003 Last data filed at 10/24/2023 2158 Gross per 24 hour  Intake 10 ml  Output --  Net 10 ml   2.  Acute on chronic combined heart failure.  Last echo on 11/06/2021 shows EF 30 to 35% with a grade 2 diastolic dysfunction, moderate mitral valve regurgitation.    Continue fluid volume managed with dialysis.   3. Diabetes mellitus type II with chronic kidney  disease/renal manifestations: insulin dependent. Home regimen includes Lantus, NovoLog and glimepiride. Most recent hemoglobin A1c is 7.4 on 08/27/22.   Glucose elevated at times.   Primary team to manage sliding scale insulin.  4.  Left kidney renal mass, seen on MRI abdomen with/without contrast measuring 1.7 to 1.4 cm, consistent with small renal cell carcinoma.  Recommend follow-up with urology outpatient.   LOS: 6 Tiffany [name] 10/28/202410:03 AM

## 2023-10-25 NOTE — Progress Notes (Addendum)
Progress Note   Patient: Tiffany [name] ZOX:[id number] DOB: 06/02/1933 DOA: 10/19/2023     6 DOS: the patient was seen and examined on 10/25/2023     Subjective:  Seen and examined this morning during dialysis Patient has worked with PT OT who has recommended skilled nursing facility [facility] manager working on outpatient dialysis chair   Brief hospital course: From HPI "Tiffany [name] is a 89 y.o. female with medical history significant of chronic HFrEF with LVEF 30-35%, CAD, CKD stage IV, PAF status post recent cardioversion on Xarelto, recent obstructive labs at ureteral stone status post stenting April 20204,chronic back pain, IDDM with insulin resistance, obesity, presented with worsening of right leg rash and pain. Patient underwent cardioversion earlier this year, this time she denied any palpitations.  She has been taking torsemide 20 mg every day but does not see a significant improvement of her leg swelling. ED Course: Afebrile, nontachycardic blood pressure 109/61 O2 saturation 100% on room air.  WBC 15.3, hemoglobin 12.8, K4.2, creatinine 3.1 compared to 1.82 months ago.  Glucose 263.  "   Assessment and Plan:   Acute on chronic HFrEF decompensation -Anasarca-improved -Strongly suspect cardiorenal syndrome, given the significant status of fluid overload. Diuresis according to nephrologist recommendation Echo showing EF 40 to 45% with grade 1 diastolic dysfunction Monitor renal function closely Outpatient follow-up with cardiology     Left renal cell carcinoma MRI abdomen obtained showing findings of left renal exophytic mass concerning for renal cell carcinoma I have discussed the case with urology Dr. Cardell Peach who recommends outpatient follow-up   AKI on CKD Patient currently on hemodialysis which was initiated during this hospitalization -Suspect cardiorenal syndrome as patient is significantly fluid overloaded, management as above Uric acid elevated and patient currently  on allopurinol Of care discussed with nephrologist Continue hemodialysis according to nephrologist recommendation Patient to have outpatient dialysis seat Vascular surgeon has placed permacath for hemodialysis on 10/22/2023   Recurrent right leg cellulitis with bacteremia -Continue ceftriaxone Blood culture results showing bacteremia Repeat blood cultures negative We will seek IDs input concerning discharge medication   Severe right knee pain secondary to osteoarthritis Orthopedics requested for input and possible intra-articular injection as requested by patient's husband Continue current pain regiment Orthopedics on board and case discussed   Elevated lactic acid -With fluid overload, suspect cardiorenal syndrome and intravascular depletion.   PAF -Status post cardioversion in April of this year.  Patient denied any palpitations.  At this point unclear what causes her CHF decompensation.  Xarelto have been changed to Eliquis as patient is now on hemodialysis as recommended by pharmacist   IDDM with hyperglycemia, with insulin resistance as well as hypoglycemia Insulin adjusted for hypoglycemia Monitor glucose closely   Recent left-sided hydronephrosis secondary to obstructing ureteral stone Left renal cell carcinoma -No urinary symptoms MRI of the abdomen showed findings of exophytic mass in the left kidney concerning for left renal cell carcinoma I discussed this with urologist Dr.Gay who has agreed to follow the patient up as an outpatient   Urinary retention Voiding trial today   Chronic LBBB -With low LVEF, consider CRT. Outpatient cardiology follow up.   DVT prophylaxis: On Eliquis Code Status: Full code   consults called: Nephrology Admission status: Telemetry admission     Physical Exam: Eyes: PERRL, lids and conjunctivae normal ENMT: Mucous membranes are moist. Posterior pharynx clear of any exudate or lesions.Normal dentition.  Neck: normal, supple, no  masses, no thyromegaly Respiratory: clear to auscultation bilaterally Cardiovascular:  Regular rate and rhythm, no murmurs / rubs / gallops.  Abdomen: no tenderness, no masses palpated.  Musculoskeletal: no clubbing / cyanosis.  Skin: Erythema to the right lower extremity is improving Neurologic: CN 2-12 grossly intact. Sensation intact, DTR normal. Strength 5/5 in all 4.  Psychiatric: Normal judgment and insight. Alert and oriented x 3. Normal mood.        Data Reviewed: Labs and vitals have been reviewed and shown below   Family Communication: Discussed with husband present at bedside   Time spent: 41 minutes     Latest Ref Rng & Units 10/25/2023    3:38 AM 10/24/2023    3:32 AM 10/23/2023    3:38 AM  CBC  WBC 4.0 - 10.5 K/uL 8.0  8.6  9.9   Hemoglobin 12.0 - 15.0 g/dL 16.1  9.5  09.6   Hematocrit 36.0 - 46.0 % 32.8  30.7  33.9   Platelets 150 - 400 K/uL 201  167  169        Latest Ref Rng & Units 10/25/2023    3:38 AM 10/24/2023    3:32 AM 10/23/2023    3:38 AM  BMP  Glucose 70 - 99 mg/dL 045  409  811   BUN 8 - 23 mg/dL 48  32  50   Creatinine 0.44 - 1.00 mg/dL 9.14  7.82  9.56   Sodium 135 - 145 mmol/L 133  135  135   Potassium 3.5 - 5.1 mmol/L 4.6  3.3  3.6   Chloride 98 - 111 mmol/L 97  99  96   CO2 22 - 32 mmol/L 25  28  26    Calcium 8.9 - 10.3 mg/dL 8.3  8.0  8.1     Vitals:   10/25/23 1145 10/25/23 1200 10/25/23 1218 10/25/23 1251  BP: (!) 98/55 (!) 107/56 121/61 113/61  Pulse: 79 81 80 81  Resp: 19 (!) 23 18 20   Temp:   97.8 F (36.6 C) 98.2 F (36.8 C)  TempSrc:   Oral Oral  SpO2: 99% 98% 99% 97%  Weight:   83.2 kg   Height:         Author: Loyce Dys, MD 10/25/2023 2:12 PM  For on call review [email or web address].

## 2023-10-25 NOTE — Discharge Planning (Signed)
PLACEMENT RESOLVED: Outpatient Facility [facility]  [address] [place], [place] [address] Phone: [phone] Fax: [phone]  Schedule: MWF 10:45am Start Date: Wednesday 10/30 10:30am   Dimas Chyle Dialysis Coordinator II  [facility] Cell: [phone] eFax: [phone] [email or web address]

## 2023-10-26 ENCOUNTER — Telehealth: Payer: Self-pay

## 2023-10-26 DIAGNOSIS — M79604 Pain in right leg: Secondary | ICD-10-CM | POA: Diagnosis not present

## 2023-10-26 DIAGNOSIS — L0390 Cellulitis, unspecified: Secondary | ICD-10-CM | POA: Diagnosis not present

## 2023-10-26 DIAGNOSIS — E0800 Diabetes mellitus due to underlying condition with hyperosmolarity without nonketotic hyperglycemic-hyperosmolar coma (NKHHC): Secondary | ICD-10-CM | POA: Diagnosis not present

## 2023-10-26 DIAGNOSIS — N179 Acute kidney failure, unspecified: Secondary | ICD-10-CM | POA: Diagnosis not present

## 2023-10-26 LAB — GLUCOSE, CAPILLARY
Glucose-Capillary: 283 mg/dL — ABNORMAL HIGH (ref 70–99)
Glucose-Capillary: 310 mg/dL — ABNORMAL HIGH (ref 70–99)
Glucose-Capillary: 350 mg/dL — ABNORMAL HIGH (ref 70–99)
Glucose-Capillary: 263 mg/dL — ABNORMAL HIGH (ref 70–99)
Glucose-Capillary: 223 mg/dL — ABNORMAL HIGH (ref 70–99)

## 2023-10-26 LAB — CBC WITH DIFFERENTIAL/PLATELET
Abs Immature Granulocytes: 0.57 10*3/uL — ABNORMAL HIGH (ref 0.00–0.07)
Basophils Absolute: 0 10*3/uL (ref 0.0–0.1)
Basophils Relative: 0 %
Eosinophils Absolute: 0 10*3/uL (ref 0.0–0.5)
Eosinophils Relative: 0 %
HCT: 31 % — ABNORMAL LOW (ref 36.0–46.0)
Hemoglobin: 9.9 g/dL — ABNORMAL LOW (ref 12.0–15.0)
Immature Granulocytes: 6 %
Lymphocytes Relative: 10 %
Lymphs Abs: 0.9 10*3/uL (ref 0.7–4.0)
MCH: 26.8 pg (ref 26.0–34.0)
MCHC: 31.9 g/dL (ref 30.0–36.0)
MCV: 84 fL (ref 80.0–100.0)
Monocytes Absolute: 0.5 10*3/uL (ref 0.1–1.0)
Monocytes Relative: 5 %
Neutro Abs: 7.6 10*3/uL (ref 1.7–7.7)
Neutrophils Relative %: 79 %
Platelets: 247 10*3/uL (ref 150–400)
RBC: 3.69 MIL/uL — ABNORMAL LOW (ref 3.87–5.11)
RDW: 15.3 % (ref 11.5–15.5)
Smear Review: NORMAL
WBC: 9.6 10*3/uL (ref 4.0–10.5)
nRBC: 0 % (ref 0.0–0.2)

## 2023-10-26 LAB — BASIC METABOLIC PANEL
Anion gap: 13 (ref 5–15)
BUN: 42 mg/dL — ABNORMAL HIGH (ref 8–23)
CO2: 26 mmol/L (ref 22–32)
Calcium: 8.2 mg/dL — ABNORMAL LOW (ref 8.9–10.3)
Chloride: 92 mmol/L — ABNORMAL LOW (ref 98–111)
Creatinine, Ser: 2.38 mg/dL — ABNORMAL HIGH (ref 0.44–1.00)
GFR, Estimated: 19 mL/min — ABNORMAL LOW (ref >60)
Glucose, Bld: 314 mg/dL — ABNORMAL HIGH (ref 70–99)
Potassium: 4 mmol/L (ref 3.5–5.1)
Sodium: 131 mmol/L — ABNORMAL LOW (ref 135–145)

## 2023-10-26 MED ORDER — TORSEMIDE 20 MG PO TABS
20.0000 mg | ORAL_TABLET | ORAL | Status: DC
Start: 2023-10-26 — End: 2023-10-28
  Administered 2023-10-26 – 2023-10-28 (×2): 20 mg via ORAL
  Filled 2023-10-26 (×2): qty 1

## 2023-10-26 MED ORDER — SODIUM CHLORIDE 0.9 % IV SOLN
2.0000 g | INTRAVENOUS | Status: DC
  Administered 2023-10-26 – 2023-10-28 (×3): 2 g via INTRAVENOUS
  Filled 2023-10-26 (×3): qty 20

## 2023-10-26 MED ORDER — INSULIN GLARGINE-YFGN 100 UNIT/ML ~~LOC~~ SOLN
8.0000 [IU] | Freq: Every day | SUBCUTANEOUS | Status: DC
  Administered 2023-10-26 – 2023-10-28 (×3): 8 [IU] via SUBCUTANEOUS
  Filled 2023-10-26 (×3): qty 0.08

## 2023-10-26 NOTE — Progress Notes (Signed)
Physical Therapy Treatment Patient Details Name: Tiffany [name] MRN: [id number] DOB: [birth year] Today's Date: 10/26/2023   History of Present Illness Patient is a 89 y.o. female with medical history significant of chronic HFrEF with LVEF 30-35%, CAD, CKD stage IV, PAF status post recent cardioversion on Xarelto, recent obstructive labs at ureteral stone status post stenting,chronic back pain, IDDM with insulin resistance, obesity, presented with worsening of right leg rash and pain. Current MD assessment: Acute on chronic CHF, AKI, and Recurrent R leg cellulitis.    PT Comments  Pt excited to get up.  Mod a x 1 to get to EOB.  She needs more assist due to IV in L hand which is uncomfortable and limits her ability to use it.  Steady in sitting and she is able to stand from bed to RW with increased forward lean and CGA x 1.  She steps to chair and after short rest decides she wants to try again and is able to walk 15' with RW and cga x 1.  She takes good step lengths but does have decreased step height which she says is not her baseline.  She is happy with her mobility and excited to go to SNF and transition back home.  Mobility team contacted due to her request to walk again later this morning.   If plan is discharge home, recommend the following: Assist for transportation;Help with stairs or ramp for entrance;Assistance with cooking/housework;A little help with walking and/or transfers;A little help with bathing/dressing/bathroom   Can travel by private vehicle        Equipment Recommendations       Recommendations for Other Services       Precautions / Restrictions Precautions Precautions: Fall Restrictions Weight Bearing Restrictions: No     Mobility  Bed Mobility Overal bed mobility: Needs Assistance Bed Mobility: Supine to Sit     Supine to sit: Mod assist     General bed mobility comments: limited ability to assist with L hand due to IV discomfort.  if no IV cuold likely  do better. Patient Response: Cooperative  Transfers Overall transfer level: Needs assistance Equipment used: Rolling walker (2 wheels) Transfers: Sit to/from Stand Sit to Stand: Min assist, From elevated surface                Ambulation/Gait Ambulation/Gait assistance: Min assist, Contact guard assist Gait Distance (Feet): 15 Feet Assistive device: Rolling walker (2 wheels) Gait Pattern/deviations: Step-to pattern, Shuffle, Decreased stance time - right Gait velocity: decreased     General Gait Details: good quality steps with gait today but decreased step height.  pt stated she did not "shuffle at baseline"   Stairs             Wheelchair Mobility     Tilt Bed Tilt Bed Patient Response: Cooperative  Modified Rankin (Stroke Patients Only)       Balance Overall balance assessment: Needs assistance Sitting-balance support: Feet supported Sitting balance-Leahy Scale: Good     Standing balance support: During functional activity, Bilateral upper extremity supported Standing balance-Leahy Scale: Fair                              Cognition Arousal: Alert Behavior During Therapy: WFL for tasks assessed/performed Overall Cognitive Status: Within Functional Limits for tasks assessed  Exercises Other Exercises Other Exercises: seated AROM in sitting    General Comments        Pertinent Vitals/Pain Pain Assessment Pain Assessment: Faces Faces Pain Scale: Hurts even more Pain Location: R knee with gait but overall improved since steroid shot Pain Descriptors / Indicators: Sore, Tender, Grimacing, Guarding Pain Intervention(s): Limited activity within patient's tolerance, Monitored during session, Repositioned    Home Living                          Prior Function            PT Goals (current goals can now be found in the care plan section) Progress towards PT  goals: Progressing toward goals    Frequency    Min 1X/week      PT Plan      Co-evaluation              AM-PAC PT "6 Clicks" Mobility   Outcome Measure  Help needed turning from your back to your side while in a flat bed without using bedrails?: A Lot Help needed moving from lying on your back to sitting on the side of a flat bed without using bedrails?: A Lot Help needed moving to and from a bed to a chair (including a wheelchair)?: A Little Help needed standing up from a chair using your arms (e.g., wheelchair or bedside chair)?: A Little   Help needed climbing 3-5 steps with a railing? : Total 6 Click Score: 11    End of Session Equipment Utilized During Treatment: Gait belt Activity Tolerance: Patient tolerated treatment well Patient left: in chair;with chair alarm set;with call bell/phone within reach Nurse Communication: Mobility status PT Visit Diagnosis: Muscle weakness (generalized) (M62.81);Pain Pain - Right/Left: Right Pain - part of body: Leg     Time: 5621-3086 PT Time Calculation (min) (ACUTE ONLY): 14 min  Charges:    $Gait Training: 8-22 mins PT General Charges $$ ACUTE PT VISIT: 1 Visit                   Danielle Dess, PTA 10/26/23, 10:09 AM

## 2023-10-26 NOTE — Progress Notes (Signed)
Progress Note   Patient: Tiffany [name] WUJ:[id number] DOB: 14-Mar-1933 DOA: 10/19/2023     7 DOS: the patient was seen and examined on 10/26/2023       Subjective:  Seen and examined at bedside this morning in the presence of the husband Patient already has outpatient dialysis seat Denies nausea vomiting abdominal pain or chest pain   Brief hospital course: From HPI "Tiffany [name] is a 89 y.o. female with medical history significant of chronic HFrEF with LVEF 30-35%, CAD, CKD stage IV, PAF status post recent cardioversion on Xarelto, recent obstructive labs at ureteral stone status post stenting April 20204,chronic back pain, IDDM with insulin resistance, obesity, presented with worsening of right leg rash and pain. Patient underwent cardioversion earlier this year, this time she denied any palpitations.  She has been taking torsemide 20 mg every day but does not see a significant improvement of her leg swelling. ED Course: Afebrile, nontachycardic blood pressure 109/61 O2 saturation 100% on room air.  WBC 15.3, hemoglobin 12.8, K4.2, creatinine 3.1 compared to 1.82 months ago.  Glucose 263.  "   Assessment and Plan:   Acute on chronic HFrEF decompensation -Anasarca-improved -Strongly suspect cardiorenal syndrome, given the significant status of fluid overload. Diuresis according to nephrologist recommendation Echo showing EF 40 to 45% with grade 1 diastolic dysfunction Continue to monitor renal function Outpatient follow-up with cardiology     Left renal cell carcinoma MRI abdomen obtained showing findings of left renal exophytic mass concerning for renal cell carcinoma I have discussed the case with urology Dr. Cardell Peach who recommends outpatient follow-up   End-stage renal disease Patient currently on hemodialysis which was initiated during this hospitalization -Suspect cardiorenal syndrome as patient is significantly fluid overloaded, management as above Uric acid elevated and  patient currently on allopurinol Of care discussed with nephrologist Continue hemodialysis according to nephrologist recommendation on Monday Wednesday Friday Patient has outpatient dialysis chair Vascular surgeon placed permacath for hemodialysis on 10/22/2023   Recurrent right leg cellulitis with bacteremia -Continue ceftriaxone Blood culture results showing bacteremia Repeat blood cultures negative to date I had a curbside discussion with infectious disease Dr. Joylene Draft as well as ID pharmacist who have recommended antibiotic therapy up until 10/30/2023. At discharge patient should be switched to Bactrim (80 mg / 400 mg) 1 tab twice daily but not the double strength dose   Severe right knee pain secondary to osteoarthritis Status post intra-articular steroid injection by orthopedics Continue current pain regiment   Elevated lactic acid -With fluid overload, suspect cardiorenal syndrome and intravascular depletion.   PAF -Status post cardioversion in April of this year.  Patient denied any palpitations.  At this point unclear what causes her CHF decompensation.  Xarelto have been changed to Eliquis as patient is now on hemodialysis as recommended by pharmacist   IDDM with hyperglycemia, with insulin resistance as well as hypoglycemia Insulin adjusted for hypoglycemia Monitor glucose closely   Recent left-sided hydronephrosis secondary to obstructing ureteral stone Left renal cell carcinoma -No urinary symptoms MRI of the abdomen showed findings of exophytic mass in the left kidney concerning for left renal cell carcinoma I discussed this with urologist Dr.Gay who has agreed to follow the patient up as an outpatient   Urinary retention Showing failed voiding trial I have discussed with her as well as the husband and she will be discharged with Foley catheter   Chronic LBBB -With low LVEF, consider CRT. Outpatient cardiology follow up.   DVT prophylaxis: On Eliquis Code  Status: Full code   consults called: Nephrology Admission status: Telemetry admission     Physical Exam: Eyes: PERRL, lids and conjunctivae normal ENMT: Mucous membranes are moist. Posterior pharynx clear of any exudate or lesions.Normal dentition.  Neck: normal, supple, no masses, no thyromegaly Respiratory: clear to auscultation bilaterally Cardiovascular: Regular rate and rhythm, no murmurs / rubs / gallops.  Abdomen: no tenderness, no masses palpated.  Musculoskeletal: no clubbing / cyanosis.  Skin: Erythema to the right lower extremity is improving Neurologic: CN 2-12 grossly intact. Sensation intact, DTR normal. Strength 5/5 in all 4.  Psychiatric: Normal judgment and insight. Alert and oriented x 3. Normal mood.     Data Reviewed: I have reviewed patient's lab report as shown as well as vitals, nephrology documentation and PT OT note  Family Communication: Discussed with husband present at bedside   Time spent: 44 minutes      Latest Ref Rng & Units 10/26/2023    5:21 AM 10/25/2023    3:38 AM 10/24/2023    3:32 AM  BMP  Glucose 70 - 99 mg/dL 409  811  914   BUN 8 - 23 mg/dL 42  48  32   Creatinine 0.44 - 1.00 mg/dL 7.82  9.56  2.13   Sodium 135 - 145 mmol/L 131  133  135   Potassium 3.5 - 5.1 mmol/L 4.0  4.6  3.3   Chloride 98 - 111 mmol/L 92  97  99   CO2 22 - 32 mmol/L 26  25  28    Calcium 8.9 - 10.3 mg/dL 8.2  8.3  8.0     Vitals:   10/26/23 0016 10/26/23 0430 10/26/23 1200 10/26/23 1606  BP: 130/71 (!) 137/54 (!) 122/50 (!) 127/50  Pulse: 66 67 60 71  Resp: 18 18    Temp: 97.7 F (36.5 C) 98 F (36.7 C) 97.9 F (36.6 C) 98.2 F (36.8 C)  TempSrc: Oral Oral  Oral  SpO2: 97% 96% 100% 100%  Weight:      Height:          Latest Ref Rng & Units 10/26/2023    5:21 AM 10/25/2023    3:38 AM 10/24/2023    3:32 AM  CBC  WBC 4.0 - 10.5 K/uL 9.6  8.0  8.6   Hemoglobin 12.0 - 15.0 g/dL 9.9  08.6  9.5   Hematocrit 36.0 - 46.0 % 31.0  32.8  30.7   Platelets  150 - 400 K/uL 247  201  167      Author: Loyce Dys, MD 10/26/2023 4:08 PM  For on call review [email or web address].

## 2023-10-26 NOTE — Telephone Encounter (Signed)
Pts husband- Dr. Montez Morita would like to know if you would compare the u/s from 3/24 to the MRI on 10/24.   Tiffany [name] is has been admitted x 1 week for kidney failure. She is currently on dialysis.   He would like your advise.

## 2023-10-26 NOTE — TOC Progression Note (Signed)
Transition of Care [facility]) - Progression Note    Patient Details  Name: Tiffany [name] MRN: [id number] Date of Birth: Dec 23, 1933  [facility] CM/SW Contact  Truddie Hidden, RN Phone Number: 10/26/2023, 9:43 AM  Clinical Narrative:    Spoke with Tammy from [facility]. Auth started.         Expected Discharge Plan and Services                                               Social Determinants of Health (SDOH) Interventions SDOH Screenings   Food Insecurity: No Food Insecurity (10/19/2023)  Housing: Low Risk  (10/19/2023)  Transportation Needs: No Transportation Needs (10/19/2023)  Utilities: Not At Risk (10/19/2023)  Depression (PHQ2-9): Low Risk  (02/20/2019)  Financial Resource Strain: Low Risk  (09/29/2023)   Received from [facility]  Tobacco Use: Low Risk  (10/19/2023)    Readmission Risk Interventions     No data to display

## 2023-10-26 NOTE — Progress Notes (Addendum)
[facility]  ROUNDING NOTE   Subjective:   Patient seen resting in bed Husband at bedside States he is currently seeking rehab placement Appetite stable   Objective:  Vital signs in last 24 hours:  Temp:  [97.7 F (36.5 C)-98.4 F (36.9 C)] 98 F (36.7 C) (10/29 0430) Pulse Rate:  [66-81] 67 (10/29 0430) Resp:  [16-23] 18 (10/29 0430) BP: (86-137)/(54-75) 137/54 (10/29 0430) SpO2:  [96 %-99 %] 96 % (10/29 0430) Weight:  [83.2 kg] 83.2 kg (10/28 1218)  Weight change:  Filed Weights   10/23/23 1039 10/25/23 0833 10/25/23 1218  Weight: 85.6 kg 85.6 kg 83.2 kg    Intake/Output: I/O last 3 completed shifts: In: 10 [I.V.:10] Out: 2350 [Urine:350; Other:2000]   Intake/Output this shift:  No intake/output data recorded.  Physical Exam: General: NAD  Head: Normocephalic, atraumatic. Moist oral mucosal membranes  Eyes: Anicteric  Lungs:  Clear to auscultation  Heart: Regular rate and rhythm  Abdomen:  Soft, nontender  Extremities: Trace peripheral edema.  Neurologic: Alert, moving all four extremities  Skin: BLE erythema (Ace wrapped)  ACCESS Rt Chest permcath placed on 10/22/23    Basic Metabolic Panel: Recent Labs  Lab 10/22/23 0353 10/23/23 0338 10/24/23 0332 10/25/23 0338 10/26/23 0521  NA 138 135 135 133* 131*  K 3.5 3.6 3.3* 4.6 4.0  CL 100 96* 99 97* 92*  CO2 28 26 28 25 26   GLUCOSE 47* 275* 130* 217* 314*  BUN 75* 50* 32* 48* 42*  CREATININE 2.90* 2.31* 1.93* 2.61* 2.38*  CALCIUM 8.4* 8.1* 8.0* 8.3* 8.2*    Liver Function Tests: No results for input(s): "AST", "ALT", "ALKPHOS", "BILITOT", "PROT", "ALBUMIN" in the last 168 hours. No results for input(s): "LIPASE", "AMYLASE" in the last 168 hours. No results for input(s): "AMMONIA" in the last 168 hours.  CBC: Recent Labs  Lab 10/22/23 0353 10/23/23 0338 10/24/23 0332 10/25/23 0338 10/26/23 0521  WBC 11.5* 9.9 8.6 8.0 9.6  NEUTROABS 9.5* 8.0* 5.8 6.7 7.6  HGB 9.4* 10.5* 9.5*  10.5* 9.9*  HCT 29.3* 33.9* 30.7* 32.8* 31.0*  MCV 84.4 86.7 86.2 83.7 84.0  PLT 163 169 167 201 247    Cardiac Enzymes: No results for input(s): "CKTOTAL", "CKMB", "CKMBINDEX", "TROPONINI" in the last 168 hours.   BNP: Invalid input(s): "POCBNP"  CBG: Recent Labs  Lab 10/24/23 2147 10/25/23 1252 10/25/23 1654 10/25/23 2052 10/26/23 0754  GLUCAP 244* 163* 213* 316* 283*    Microbiology: Results for orders placed or performed during the hospital encounter of 10/19/23  Blood culture (routine x 2)     Status: Abnormal   Collection Time: 10/19/23  9:10 AM   Specimen: BLOOD  Result Value Ref Range Status   Specimen Description   Final    BLOOD RIGHT WRIST Performed at [facility], [address], [place], [place] [address]    Special Requests   Final    BOTTLES DRAWN AEROBIC AND ANAEROBIC Blood Culture adequate volume Performed at [facility], [address] [place], [place] [address]    Culture  Setup Time   Final    Organism ID to follow GRAM NEGATIVE RODS ANAEROBIC BOTTLE ONLY CRITICAL RESULT CALLED TO, READ BACK BY AND VERIFIED WITH: JASON ROBBINS PHARMD @0000  10/20/23 ASW Performed at [facility], [address] [place], [place] [address]    Culture KLEBSIELLA OXYTOCA (A)  Final   Report Status 10/22/2023 FINAL  Final   Organism ID, Bacteria KLEBSIELLA OXYTOCA  Final  Susceptibility   Klebsiella oxytoca - MIC*    AMPICILLIN >=32 RESISTANT Resistant     CEFEPIME <=0.12 SENSITIVE Sensitive     CEFTAZIDIME <=1 SENSITIVE Sensitive     CEFTRIAXONE <=0.25 SENSITIVE Sensitive     CIPROFLOXACIN <=0.25 SENSITIVE Sensitive     GENTAMICIN <=1 SENSITIVE Sensitive     IMIPENEM <=0.25 SENSITIVE Sensitive     TRIMETH/SULFA <=20 SENSITIVE Sensitive     AMPICILLIN/SULBACTAM 8 SENSITIVE Sensitive     PIP/TAZO <=4 SENSITIVE Sensitive ug/mL    * KLEBSIELLA OXYTOCA  Blood Culture ID Panel (Reflexed)     Status: Abnormal   Collection  Time: 10/19/23  9:10 AM  Result Value Ref Range Status   Enterococcus faecalis NOT DETECTED NOT DETECTED Final   Enterococcus Faecium NOT DETECTED NOT DETECTED Final   Listeria monocytogenes NOT DETECTED NOT DETECTED Final   Staphylococcus species NOT DETECTED NOT DETECTED Final   Staphylococcus aureus (BCID) NOT DETECTED NOT DETECTED Final   Staphylococcus epidermidis NOT DETECTED NOT DETECTED Final   Staphylococcus lugdunensis NOT DETECTED NOT DETECTED Final   Streptococcus species NOT DETECTED NOT DETECTED Final   Streptococcus agalactiae NOT DETECTED NOT DETECTED Final   Streptococcus pneumoniae NOT DETECTED NOT DETECTED Final   Streptococcus pyogenes NOT DETECTED NOT DETECTED Final   A.calcoaceticus-baumannii NOT DETECTED NOT DETECTED Final   Bacteroides fragilis NOT DETECTED NOT DETECTED Final   Enterobacterales DETECTED (A) NOT DETECTED Final    Comment: Enterobacterales represent a large order of gram negative bacteria, not a single organism. CRITICAL RESULT CALLED TO, READ BACK BY AND VERIFIED WITH: JASON ROBBINS PHARMD @0000  10/20/23 ASW    Enterobacter cloacae complex NOT DETECTED NOT DETECTED Final   Escherichia coli NOT DETECTED NOT DETECTED Final   Klebsiella aerogenes NOT DETECTED NOT DETECTED Final   Klebsiella oxytoca DETECTED (A) NOT DETECTED Final    Comment: CRITICAL RESULT CALLED TO, READ BACK BY AND VERIFIED WITH: JASON ROBBINS PHARMD @0000  10/20/23 ASW    Klebsiella pneumoniae NOT DETECTED NOT DETECTED Final   Proteus species NOT DETECTED NOT DETECTED Final   Salmonella species NOT DETECTED NOT DETECTED Final   Serratia marcescens NOT DETECTED NOT DETECTED Final   Haemophilus influenzae NOT DETECTED NOT DETECTED Final   Neisseria meningitidis NOT DETECTED NOT DETECTED Final   Pseudomonas aeruginosa NOT DETECTED NOT DETECTED Final   Stenotrophomonas maltophilia NOT DETECTED NOT DETECTED Final   Candida albicans NOT DETECTED NOT DETECTED Final   Candida auris  NOT DETECTED NOT DETECTED Final   Candida glabrata NOT DETECTED NOT DETECTED Final   Candida krusei NOT DETECTED NOT DETECTED Final   Candida parapsilosis NOT DETECTED NOT DETECTED Final   Candida tropicalis NOT DETECTED NOT DETECTED Final   Cryptococcus neoformans/gattii NOT DETECTED NOT DETECTED Final   CTX-M ESBL NOT DETECTED NOT DETECTED Final   Carbapenem resistance IMP NOT DETECTED NOT DETECTED Final   Carbapenem resistance KPC NOT DETECTED NOT DETECTED Final   Carbapenem resistance NDM NOT DETECTED NOT DETECTED Final   Carbapenem resist OXA 48 LIKE NOT DETECTED NOT DETECTED Final   Carbapenem resistance VIM NOT DETECTED NOT DETECTED Final    Comment: Performed at [facility], [address] [place], [place] [address]  Blood culture (routine x 2)     Status: None   Collection Time: 10/19/23 11:45 AM   Specimen: BLOOD  Result Value Ref Range Status   Specimen Description BLOOD LEFT ANTECUBITAL  Final   Special Requests   Final    BOTTLES DRAWN  AEROBIC AND ANAEROBIC Blood Culture adequate volume   Culture   Final    NO GROWTH 5 DAYS Performed at [facility], [address] [place], [place] [address]    Report Status 10/24/2023 FINAL  Final  Group A Strep by PCR     Status: None   Collection Time: 10/19/23  4:15 PM   Specimen: Throat; Sterile Swab  Result Value Ref Range Status   Group A Strep by PCR NOT DETECTED NOT DETECTED Final    Comment: Performed at [facility], [address], [place], [place] [address]  MRSA Next Gen by PCR, Nasal     Status: Abnormal   Collection Time: 10/21/23  6:15 PM   Specimen: Nasal Mucosa; Nasal Swab  Result Value Ref Range Status   MRSA by PCR Next Gen DETECTED (A) NOT DETECTED Final    Comment: RESULT CALLED TO, READ BACK BY AND VERIFIED WITH: YASMINE SORIANO @1935  ON 10/21/23 SKL (NOTE) The GeneXpert MRSA Assay (FDA approved for NASAL specimens only), is one component of a comprehensive MRSA colonization  surveillance program. It is not intended to diagnose MRSA infection nor to guide or monitor treatment for MRSA infections. Test performance is not FDA approved in patients less than 55 years old. Performed at [facility], [address] [place], [place] [address]   Culture, blood (Routine X 2) w Reflex to ID Panel     Status: None (Preliminary result)   Collection Time: 10/23/23  3:10 PM   Specimen: BLOOD  Result Value Ref Range Status   Specimen Description BLOOD BLOOD RIGHT HAND  Final   Special Requests   Final    BOTTLES DRAWN AEROBIC AND ANAEROBIC Blood Culture adequate volume   Culture   Final    NO GROWTH 3 DAYS Performed at [facility], [address] [place], [place] [address]    Report Status PENDING  Incomplete  Culture, blood (Routine X 2) w Reflex to ID Panel     Status: None (Preliminary result)   Collection Time: 10/23/23  3:17 PM   Specimen: BLOOD  Result Value Ref Range Status   Specimen Description BLOOD BLOOD LEFT HAND  Final   Special Requests   Final    BOTTLES DRAWN AEROBIC AND ANAEROBIC Blood Culture results may not be optimal due to an excessive volume of blood received in culture bottles   Culture   Final    NO GROWTH 3 DAYS Performed at [facility], [address] [place], [place] [address]    Report Status PENDING  Incomplete    Coagulation Studies: No results for input(s): "LABPROT", "INR" in the last 72 hours.  Urinalysis: No results for input(s): "COLORURINE", "LABSPEC", "PHURINE", "GLUCOSEU", "HGBUR", "BILIRUBINUR", "KETONESUR", "PROTEINUR", "UROBILINOGEN", "NITRITE", "LEUKOCYTESUR" in the last 72 hours.  Invalid input(s): "APPERANCEUR"    Imaging: No results found.   Medications:    cefTRIAXone (ROCEPHIN)  IV 2 g (10/26/23 1020)    (feeding supplement) PROSource Plus  30 mL Oral TID BM   allopurinol  50 mg Oral Daily   amiodarone  200 mg Oral Daily   apixaban  2.5 mg Oral BID   bupivacaine(PF)   10 mL Infiltration Once   Chlorhexidine Gluconate Cloth  6 each Topical Q0600   insulin aspart  0-15 Units Subcutaneous TID WC   insulin aspart  0-5 Units Subcutaneous QHS   insulin glargine-yfgn  8 Units Subcutaneous Daily   linagliptin  5 mg Oral Daily  metoprolol succinate  12.5 mg Oral BID   multivitamin  1 tablet Oral QHS   mupirocin ointment  1 Application Nasal BID   sodium chloride flush  10 mL Intravenous Q12H   sodium chloride flush  3 mL Intravenous Q12H   triamcinolone acetonide  80 mg Intra-articular Once   acetaminophen, HYDROcodone-acetaminophen, ondansetron (ZOFRAN) IV, polyethylene glycol, sodium chloride flush  Assessment/ Plan:  [name] is a 89 y.o.  female with past medical history including chronic heart failure, A-fib with recent cardioversion on Xarelto, CAD, and chronic kidney disease stage IV-5, who was admitted to [facility] on 10/19/2023 for CHF (congestive heart failure) (HCC) [I50.9] Bilateral leg pain [M79.604, M79.605] AKI (acute kidney injury) (HCC) [N17.9] Cellulitis, unspecified cellulitis site [L03.90]   End stage renal disease requiring hemodialysis. We feel with progressive renal function and failed outpatient diuretic therapy, patient is now deemed end stage renal disease.   Patient received third dialysis treatment yesterday, tolerated well.  Patient has been able to sit in recliner for multiple hours at bedside.  Next treatment scheduled for Wednesday. Appreciate renal navigator confirming outpatient dialysis clinic at [facility] on a MWF schedule.  Lab Results  Component Value Date   CREATININE 2.38 (H) 10/26/2023   CREATININE 2.61 (H) 10/25/2023   CREATININE 1.93 (H) 10/24/2023    Intake/Output Summary (Last 24 hours) at 10/26/2023 1021 Last data filed at 10/25/2023 1300 Gross per 24 hour  Intake --  Output 2350 ml  Net -2350 ml   2.  Acute on chronic combined heart failure.  Last echo on 11/06/2021 shows EF 30 to 35%  with a grade 2 diastolic dysfunction, moderate mitral valve regurgitation.    Volume status management per dialysis.  Will order Torsemide 20mg  daily on nonHD days   3. Diabetes mellitus type II with chronic kidney disease/renal manifestations: insulin dependent. Home regimen includes Lantus, NovoLog and glimepiride. Most recent hemoglobin A1c is 7.4 on 08/27/22.   Primary team to manage sliding scale insulin.  4.  Left kidney renal mass, seen on MRI abdomen with/without contrast measuring 1.7 to 1.4 cm, consistent with small renal cell carcinoma.  Will follow-up with urology outpatient.   LOS: 7 Zhuri Krass 10/29/202410:21 AM

## 2023-10-26 NOTE — Inpatient Diabetes Management (Addendum)
Inpatient Diabetes Program Recommendations  AACE/ADA: New Consensus Statement on Inpatient Glycemic Control (2015)  Target Ranges:  Prepandial:   less than 140 mg/dL      Peak postprandial:   less than 180 mg/dL (1-2 hours)      Critically ill patients:  140 - 180 mg/dL    Latest Reference Range & Units 10/25/23 12:52 10/25/23 16:54 10/25/23 20:52  Glucose-Capillary 70 - 99 mg/dL 875 (H)  4 units Novolog  5 units Semglee  213 (H)  7 units Novolog  316 (H)  4 units Novolog   (H): Data is abnormally high  Latest Reference Range & Units 10/26/23 07:54  Glucose-Capillary 70 - 99 mg/dL 643 (H)  (H): Data is abnormally high   Home DM Meds: Amaryl 2 mg daily     Toujeo 10-14 units daily      Novolog 4-6 units before supper    Current Orders: Semglee 8 units Daily                            Novolog Moderate Correction Scale/ SSI (0-15 units) TID AC + HS                            Tradjenta 5 mg daily     MD- Note CBG 283 this AM.  CBGs elevated last PM as well.  Note Semglee increased to 8 units daily this AM.  Please consider:  Start Novolog Meal Coverage: Novolog 3 units TID with meals HOLD if pt NPO HOLD if pt eats <50% meals    --Will follow patient during hospitalization--  Ambrose Finland RN, MSN, CDCES Diabetes Coordinator Inpatient Glycemic Control Team Team Pager: [phone] (8a-5p)

## 2023-10-26 NOTE — Plan of Care (Signed)
  Problem: Education: Goal: Ability to describe self-care measures that may prevent or decrease complications (Diabetes Survival Skills Education) will improve Outcome: Progressing Goal: Individualized Educational Video(s) Outcome: Progressing   Problem: Coping: Goal: Ability to adjust to condition or change in health will improve Outcome: Progressing   Problem: Fluid Volume: Goal: Ability to maintain a balanced intake and output will improve Outcome: Progressing   Problem: Health Behavior/Discharge Planning: Goal: Ability to identify and utilize available resources and services will improve Outcome: Progressing Goal: Ability to manage health-related needs will improve Outcome: Progressing   Problem: Metabolic: Goal: Ability to maintain appropriate glucose levels will improve Outcome: Progressing   Problem: Nutritional: Goal: Maintenance of adequate nutrition will improve Outcome: Progressing Goal: Progress toward achieving an optimal weight will improve Outcome: Progressing   Problem: Skin Integrity: Goal: Risk for impaired skin integrity will decrease Outcome: Progressing   Problem: Tissue Perfusion: Goal: Adequacy of tissue perfusion will improve Outcome: Progressing   Problem: Education: Goal: Knowledge of General Education information will improve Description: Including pain rating scale, medication(s)/side effects and non-pharmacologic comfort measures Outcome: Progressing   Problem: Health Behavior/Discharge Planning: Goal: Ability to manage health-related needs will improve Outcome: Progressing   Problem: Clinical Measurements: Goal: Ability to maintain clinical measurements within normal limits will improve Outcome: Progressing Goal: Will remain free from infection Outcome: Progressing Goal: Diagnostic test results will improve Outcome: Progressing Goal: Respiratory complications will improve Outcome: Progressing Goal: Cardiovascular complication will  be avoided Outcome: Progressing   Problem: Activity: Goal: Risk for activity intolerance will decrease Outcome: Progressing   Problem: Nutrition: Goal: Adequate nutrition will be maintained Outcome: Progressing   Problem: Coping: Goal: Level of anxiety will decrease Outcome: Progressing   Problem: Elimination: Goal: Will not experience complications related to bowel motility Outcome: Progressing Goal: Will not experience complications related to urinary retention Outcome: Progressing   Problem: Pain Management: Goal: General experience of comfort will improve Outcome: Progressing   Problem: Safety: Goal: Ability to remain free from injury will improve Outcome: Progressing   Problem: Skin Integrity: Goal: Risk for impaired skin integrity will decrease Outcome: Progressing   Problem: Education: Goal: Knowledge of disease and its progression will improve Outcome: Progressing   Problem: Fluid Volume: Goal: Compliance with measures to maintain balanced fluid volume will improve Outcome: Progressing   Problem: Health Behavior/Discharge Planning: Goal: Ability to manage health-related needs will improve Outcome: Progressing   Problem: Nutritional: Goal: Ability to make healthy dietary choices will improve Outcome: Progressing   Problem: Clinical Measurements: Goal: Complications related to the disease process, condition or treatment will be avoided or minimized Outcome: Progressing

## 2023-10-27 ENCOUNTER — Encounter: Payer: PPO | Admitting: Family

## 2023-10-27 DIAGNOSIS — I509 Heart failure, unspecified: Secondary | ICD-10-CM | POA: Diagnosis not present

## 2023-10-27 LAB — CBC WITH DIFFERENTIAL/PLATELET
Abs Immature Granulocytes: 0.34 10*3/uL — ABNORMAL HIGH (ref 0.00–0.07)
Basophils Absolute: 0 10*3/uL (ref 0.0–0.1)
Basophils Relative: 0 %
Eosinophils Absolute: 0 10*3/uL (ref 0.0–0.5)
Eosinophils Relative: 0 %
HCT: 34.1 % — ABNORMAL LOW (ref 36.0–46.0)
Hemoglobin: 10.6 g/dL — ABNORMAL LOW (ref 12.0–15.0)
Immature Granulocytes: 4 %
Lymphocytes Relative: 12 %
Lymphs Abs: 1.1 10*3/uL (ref 0.7–4.0)
MCH: 27 pg (ref 26.0–34.0)
MCHC: 31.1 g/dL (ref 30.0–36.0)
MCV: 86.8 fL (ref 80.0–100.0)
Monocytes Absolute: 0.5 10*3/uL (ref 0.1–1.0)
Monocytes Relative: 6 %
Neutro Abs: 7.1 10*3/uL (ref 1.7–7.7)
Neutrophils Relative %: 78 %
Platelets: 249 10*3/uL (ref 150–400)
RBC: 3.93 MIL/uL (ref 3.87–5.11)
RDW: 15.4 % (ref 11.5–15.5)
WBC: 9.2 10*3/uL (ref 4.0–10.5)
nRBC: 0 % (ref 0.0–0.2)

## 2023-10-27 LAB — BASIC METABOLIC PANEL
Anion gap: 13 (ref 5–15)
BUN: 68 mg/dL — ABNORMAL HIGH (ref 8–23)
CO2: 23 mmol/L (ref 22–32)
Calcium: 8.3 mg/dL — ABNORMAL LOW (ref 8.9–10.3)
Chloride: 95 mmol/L — ABNORMAL LOW (ref 98–111)
Creatinine, Ser: 3.02 mg/dL — ABNORMAL HIGH (ref 0.44–1.00)
GFR, Estimated: 14 mL/min — ABNORMAL LOW (ref >60)
Glucose, Bld: 207 mg/dL — ABNORMAL HIGH (ref 70–99)
Potassium: 3.8 mmol/L (ref 3.5–5.1)
Sodium: 131 mmol/L — ABNORMAL LOW (ref 135–145)

## 2023-10-27 LAB — GLUCOSE, CAPILLARY
Glucose-Capillary: 203 mg/dL — ABNORMAL HIGH (ref 70–99)
Glucose-Capillary: 360 mg/dL — ABNORMAL HIGH (ref 70–99)
Glucose-Capillary: 362 mg/dL — ABNORMAL HIGH (ref 70–99)

## 2023-10-27 MED ORDER — HEPARIN SODIUM (PORCINE) 1000 UNIT/ML IJ SOLN
INTRAMUSCULAR | Status: AC
  Filled 2023-10-27: qty 10

## 2023-10-27 NOTE — Progress Notes (Signed)
PROGRESS NOTE    Rue Krabbenhoft   FAO:[id number] DOB: Feb 20, 1933  DOA: 10/19/2023 Date of Service: 10/27/23 which is hospital day 8  PCP: Lynnea Ferrier, MD    HPI: Tiffany [name] is a 89 y.o. female with medical history significant of chronic HFrEF with LVEF 30-35%, CAD, CKD stage IV, PAF status post recent cardioversion on Xarelto, ureteral stone status post stenting April 2024, chronic back pain, IDDM, obesity, presented to ED with worsening of right leg rash and pain.   Hospital course / significant events:  10/19/23: Admitted with cellulitis, HFrEF exacerbation, AKI on CKD 10/24: decision made to initiate hemodialysis 10/25: cath placement and initial HD treatment  10/26-10/30: stable, outpatient HD access is arranged, SNF rehab placement pending auth   Consultants:  Nephrology Vascular surgery   Procedures/Surgeries: Vascular surgeon placed permacath for hemodialysis on 10/22/2023      ASSESSMENT & PLAN:   Acute on chronic HFrEF decompensation Anasarca - improved Cardiorenal syndrome Echo showing EF 40 to 45% with grade 1 diastolic dysfunction Dialysis to manage fluid status  GDMT as able Outpatient follow-up with cardiology    Left renal cell carcinoma Recent left-sided hydronephrosis secondary to obstructing ureteral stone MRI abdomen obtained showing findings of left renal exophytic mass concerning for renal cell carcinoma Prior hospitalist has discussed the case with urology Dr. Cardell Peach who recommends outpatient follow-up Per nephrology, avoid EPO/Mircera due to the renal mass.   Urinary retention Showing failed voiding trial Plan discharged with Foley catheter   End-stage renal disease Hemodialysis was initiated during this hospitalization Continue hemodialysis according to nephrologist recommendation on Monday Wednesday Friday. Patient has outpatient dialysis chair   Recurrent right leg cellulitis with bacteremia Repeat blood cultures negative to  date Prior hospitalist noted curbside discussion with infectious disease Dr. Joylene Draft as well as ID pharmacist who have recommended antibiotic therapy up until 10/30/2023. At discharge patient may be switched to Bactrim (80 mg / 400 mg) 1 tab twice daily but not the double strength dose continuing Ceftriaxone for now pending discharge    Severe right knee pain secondary to osteoarthritis Status post intra-articular steroid injection by orthopedics Continue current pain regiment   Elevated lactic acid With fluid overload, suspect cardiorenal syndrome and intravascular depletion Treated underlying causes.   PAF Status post cardioversion in April of this year.  Patient denied any palpitations.  At this point unclear what causes her CHF decompensation.  Xarelto have been changed to Eliquis as patient is now on hemodialysis    IDDM with hyperglycemia, with insulin resistance as well as hypoglycemia Insulin adjusted for hypoglycemia Monitor glucose closely    Chronic LBBB Outpatient cardiology follow up.  Uric acid elevated  allopurinol   obese based on BMI: Body mass index is 33.9 kg/m.  Underweight - under 18.5  normal weight - 18.5 to 24.9 overweight - 25 to 29.9 obese - 30 or more   DVT prophylaxis: eliquis  IV fluids: no continuous IV fluids  Nutrition: diabetic diet Central lines / invasive devices: HD cath   Code Status: FULL CODE ACP documentation reviewed: none on file in [facility]  TOC needs: placement  Barriers to dispo / significant pending items: insurance auth for SNF              Subjective / Brief ROS:  Patient reports no cocnerns at this time Answered questions from husband  Denies CP/SOB.  Pain controlled.  Denies new weakness.  Tolerating diet.    Family Communication: husband at  PROGRESS NOTE    Rue Krabbenhoft   FAO:[id number] DOB: Feb 20, 1933  DOA: 10/19/2023 Date of Service: 10/27/23 which is hospital day 8  PCP: Lynnea Ferrier, MD    HPI: Tiffany [name] is a 89 y.o. female with medical history significant of chronic HFrEF with LVEF 30-35%, CAD, CKD stage IV, PAF status post recent cardioversion on Xarelto, ureteral stone status post stenting April 2024, chronic back pain, IDDM, obesity, presented to ED with worsening of right leg rash and pain.   Hospital course / significant events:  10/19/23: Admitted with cellulitis, HFrEF exacerbation, AKI on CKD 10/24: decision made to initiate hemodialysis 10/25: cath placement and initial HD treatment  10/26-10/30: stable, outpatient HD access is arranged, SNF rehab placement pending auth   Consultants:  Nephrology Vascular surgery   Procedures/Surgeries: Vascular surgeon placed permacath for hemodialysis on 10/22/2023      ASSESSMENT & PLAN:   Acute on chronic HFrEF decompensation Anasarca - improved Cardiorenal syndrome Echo showing EF 40 to 45% with grade 1 diastolic dysfunction Dialysis to manage fluid status  GDMT as able Outpatient follow-up with cardiology    Left renal cell carcinoma Recent left-sided hydronephrosis secondary to obstructing ureteral stone MRI abdomen obtained showing findings of left renal exophytic mass concerning for renal cell carcinoma Prior hospitalist has discussed the case with urology Dr. Cardell Peach who recommends outpatient follow-up Per nephrology, avoid EPO/Mircera due to the renal mass.   Urinary retention Showing failed voiding trial Plan discharged with Foley catheter   End-stage renal disease Hemodialysis was initiated during this hospitalization Continue hemodialysis according to nephrologist recommendation on Monday Wednesday Friday. Patient has outpatient dialysis chair   Recurrent right leg cellulitis with bacteremia Repeat blood cultures negative to  date Prior hospitalist noted curbside discussion with infectious disease Dr. Joylene Draft as well as ID pharmacist who have recommended antibiotic therapy up until 10/30/2023. At discharge patient may be switched to Bactrim (80 mg / 400 mg) 1 tab twice daily but not the double strength dose continuing Ceftriaxone for now pending discharge    Severe right knee pain secondary to osteoarthritis Status post intra-articular steroid injection by orthopedics Continue current pain regiment   Elevated lactic acid With fluid overload, suspect cardiorenal syndrome and intravascular depletion Treated underlying causes.   PAF Status post cardioversion in April of this year.  Patient denied any palpitations.  At this point unclear what causes her CHF decompensation.  Xarelto have been changed to Eliquis as patient is now on hemodialysis    IDDM with hyperglycemia, with insulin resistance as well as hypoglycemia Insulin adjusted for hypoglycemia Monitor glucose closely    Chronic LBBB Outpatient cardiology follow up.  Uric acid elevated  allopurinol   obese based on BMI: Body mass index is 33.9 kg/m.  Underweight - under 18.5  normal weight - 18.5 to 24.9 overweight - 25 to 29.9 obese - 30 or more   DVT prophylaxis: eliquis  IV fluids: no continuous IV fluids  Nutrition: diabetic diet Central lines / invasive devices: HD cath   Code Status: FULL CODE ACP documentation reviewed: none on file in [facility]  TOC needs: placement  Barriers to dispo / significant pending items: insurance auth for SNF              Subjective / Brief ROS:  Patient reports no cocnerns at this time Answered questions from husband  Denies CP/SOB.  Pain controlled.  Denies new weakness.  Tolerating diet.    Family Communication: husband at  [phone]        Height:       63.0 in Accession #:    [id number]       Weight:       188.7 lb Date of Birth:  [birth year]        BSA:          1.886 m Patient Age:    89 years         BP:           132/65 mmHg Patient Gender: F                HR:           72 bpm. Exam Location:  [facility] Procedure: 2D Echo Indications:     CHF- Acute Systolic I50.21  History:         Patient has prior history of Echocardiogram examinations.  Sonographer:     Elwin Sleight RDCS Referring Phys:  [id number] Loyce Dys Diagnosing Phys: Julien Nordmann MD  Sonographer Comments: Suboptimal parasternal window, suboptimal apical window and suboptimal subcostal window. Image acquisition challenging due to patient body habitus. IMPRESSIONS  1. Left ventricular ejection fraction, by estimation, is 40 to 45%. Left ventricular ejection fraction by PLAX is 42 %. The left ventricle has mildly decreased function. The left ventricle demonstrates global hypokinesis.  Left ventricular diastolic parameters are consistent with Grade I diastolic dysfunction (impaired relaxation).  2. Right ventricular systolic function is normal. The right ventricular size is normal. There is moderately elevated pulmonary artery systolic pressure. The estimated right ventricular systolic pressure is 54.6 mmHg.  3. The mitral valve is normal in structure. Mild mitral valve regurgitation. No evidence of mitral stenosis.  4. The aortic valve has an indeterminant number of cusps. Aortic valve regurgitation is not visualized. Aortic valve sclerosis is present, with no evidence of aortic valve stenosis.  5. The inferior vena cava is normal in size with greater than 50% respiratory variability, suggesting right atrial pressure of 3 mmHg. FINDINGS  Left Ventricle: Left ventricular ejection fraction, by estimation, is 40 to 45%. Left ventricular ejection fraction by PLAX is 42 %. The left ventricle has mildly decreased function. The left ventricle demonstrates global hypokinesis. The left ventricular internal cavity size was normal in size. There is no left ventricular hypertrophy. Left ventricular diastolic parameters are consistent with Grade I diastolic dysfunction (impaired relaxation). Right Ventricle: The right ventricular size is normal. No increase in right ventricular wall thickness. Right ventricular systolic function is normal. There is moderately elevated pulmonary artery systolic pressure. The tricuspid regurgitant velocity is 3.52 m/s, and with an assumed right atrial pressure of 5 mmHg, the estimated right ventricular systolic pressure is 54.6 mmHg. Left Atrium: Left atrial size was normal in size. Right Atrium: Right atrial size was normal in size. Pericardium: There is no evidence of pericardial effusion. Mitral Valve: The mitral valve is normal in structure. Mild mitral valve regurgitation. No evidence of mitral valve stenosis. Tricuspid Valve: The tricuspid valve is normal in structure.  Tricuspid valve regurgitation is mild . No evidence of tricuspid stenosis. Aortic Valve: The aortic valve has an indeterminant number of cusps. Aortic valve regurgitation is not visualized. Aortic valve sclerosis is present, with no evidence of aortic valve stenosis. Aortic valve peak gradient measures 8.4 mmHg. Pulmonic Valve: The pulmonic valve was normal in structure. Pulmonic valve regurgitation is not visualized. No evidence of pulmonic stenosis. Aorta: The aortic root is normal in size and structure. Venous: The  PROGRESS NOTE    Rue Krabbenhoft   FAO:[id number] DOB: Feb 20, 1933  DOA: 10/19/2023 Date of Service: 10/27/23 which is hospital day 8  PCP: Lynnea Ferrier, MD    HPI: Tiffany [name] is a 89 y.o. female with medical history significant of chronic HFrEF with LVEF 30-35%, CAD, CKD stage IV, PAF status post recent cardioversion on Xarelto, ureteral stone status post stenting April 2024, chronic back pain, IDDM, obesity, presented to ED with worsening of right leg rash and pain.   Hospital course / significant events:  10/19/23: Admitted with cellulitis, HFrEF exacerbation, AKI on CKD 10/24: decision made to initiate hemodialysis 10/25: cath placement and initial HD treatment  10/26-10/30: stable, outpatient HD access is arranged, SNF rehab placement pending auth   Consultants:  Nephrology Vascular surgery   Procedures/Surgeries: Vascular surgeon placed permacath for hemodialysis on 10/22/2023      ASSESSMENT & PLAN:   Acute on chronic HFrEF decompensation Anasarca - improved Cardiorenal syndrome Echo showing EF 40 to 45% with grade 1 diastolic dysfunction Dialysis to manage fluid status  GDMT as able Outpatient follow-up with cardiology    Left renal cell carcinoma Recent left-sided hydronephrosis secondary to obstructing ureteral stone MRI abdomen obtained showing findings of left renal exophytic mass concerning for renal cell carcinoma Prior hospitalist has discussed the case with urology Dr. Cardell Peach who recommends outpatient follow-up Per nephrology, avoid EPO/Mircera due to the renal mass.   Urinary retention Showing failed voiding trial Plan discharged with Foley catheter   End-stage renal disease Hemodialysis was initiated during this hospitalization Continue hemodialysis according to nephrologist recommendation on Monday Wednesday Friday. Patient has outpatient dialysis chair   Recurrent right leg cellulitis with bacteremia Repeat blood cultures negative to  date Prior hospitalist noted curbside discussion with infectious disease Dr. Joylene Draft as well as ID pharmacist who have recommended antibiotic therapy up until 10/30/2023. At discharge patient may be switched to Bactrim (80 mg / 400 mg) 1 tab twice daily but not the double strength dose continuing Ceftriaxone for now pending discharge    Severe right knee pain secondary to osteoarthritis Status post intra-articular steroid injection by orthopedics Continue current pain regiment   Elevated lactic acid With fluid overload, suspect cardiorenal syndrome and intravascular depletion Treated underlying causes.   PAF Status post cardioversion in April of this year.  Patient denied any palpitations.  At this point unclear what causes her CHF decompensation.  Xarelto have been changed to Eliquis as patient is now on hemodialysis    IDDM with hyperglycemia, with insulin resistance as well as hypoglycemia Insulin adjusted for hypoglycemia Monitor glucose closely    Chronic LBBB Outpatient cardiology follow up.  Uric acid elevated  allopurinol   obese based on BMI: Body mass index is 33.9 kg/m.  Underweight - under 18.5  normal weight - 18.5 to 24.9 overweight - 25 to 29.9 obese - 30 or more   DVT prophylaxis: eliquis  IV fluids: no continuous IV fluids  Nutrition: diabetic diet Central lines / invasive devices: HD cath   Code Status: FULL CODE ACP documentation reviewed: none on file in [facility]  TOC needs: placement  Barriers to dispo / significant pending items: insurance auth for SNF              Subjective / Brief ROS:  Patient reports no cocnerns at this time Answered questions from husband  Denies CP/SOB.  Pain controlled.  Denies new weakness.  Tolerating diet.    Family Communication: husband at  [phone]        Height:       63.0 in Accession #:    [id number]       Weight:       188.7 lb Date of Birth:  [birth year]        BSA:          1.886 m Patient Age:    89 years         BP:           132/65 mmHg Patient Gender: F                HR:           72 bpm. Exam Location:  [facility] Procedure: 2D Echo Indications:     CHF- Acute Systolic I50.21  History:         Patient has prior history of Echocardiogram examinations.  Sonographer:     Elwin Sleight RDCS Referring Phys:  [id number] Loyce Dys Diagnosing Phys: Julien Nordmann MD  Sonographer Comments: Suboptimal parasternal window, suboptimal apical window and suboptimal subcostal window. Image acquisition challenging due to patient body habitus. IMPRESSIONS  1. Left ventricular ejection fraction, by estimation, is 40 to 45%. Left ventricular ejection fraction by PLAX is 42 %. The left ventricle has mildly decreased function. The left ventricle demonstrates global hypokinesis.  Left ventricular diastolic parameters are consistent with Grade I diastolic dysfunction (impaired relaxation).  2. Right ventricular systolic function is normal. The right ventricular size is normal. There is moderately elevated pulmonary artery systolic pressure. The estimated right ventricular systolic pressure is 54.6 mmHg.  3. The mitral valve is normal in structure. Mild mitral valve regurgitation. No evidence of mitral stenosis.  4. The aortic valve has an indeterminant number of cusps. Aortic valve regurgitation is not visualized. Aortic valve sclerosis is present, with no evidence of aortic valve stenosis.  5. The inferior vena cava is normal in size with greater than 50% respiratory variability, suggesting right atrial pressure of 3 mmHg. FINDINGS  Left Ventricle: Left ventricular ejection fraction, by estimation, is 40 to 45%. Left ventricular ejection fraction by PLAX is 42 %. The left ventricle has mildly decreased function. The left ventricle demonstrates global hypokinesis. The left ventricular internal cavity size was normal in size. There is no left ventricular hypertrophy. Left ventricular diastolic parameters are consistent with Grade I diastolic dysfunction (impaired relaxation). Right Ventricle: The right ventricular size is normal. No increase in right ventricular wall thickness. Right ventricular systolic function is normal. There is moderately elevated pulmonary artery systolic pressure. The tricuspid regurgitant velocity is 3.52 m/s, and with an assumed right atrial pressure of 5 mmHg, the estimated right ventricular systolic pressure is 54.6 mmHg. Left Atrium: Left atrial size was normal in size. Right Atrium: Right atrial size was normal in size. Pericardium: There is no evidence of pericardial effusion. Mitral Valve: The mitral valve is normal in structure. Mild mitral valve regurgitation. No evidence of mitral valve stenosis. Tricuspid Valve: The tricuspid valve is normal in structure.  Tricuspid valve regurgitation is mild . No evidence of tricuspid stenosis. Aortic Valve: The aortic valve has an indeterminant number of cusps. Aortic valve regurgitation is not visualized. Aortic valve sclerosis is present, with no evidence of aortic valve stenosis. Aortic valve peak gradient measures 8.4 mmHg. Pulmonic Valve: The pulmonic valve was normal in structure. Pulmonic valve regurgitation is not visualized. No evidence of pulmonic stenosis. Aorta: The aortic root is normal in size and structure. Venous: The  PROGRESS NOTE    Rue Krabbenhoft   FAO:[id number] DOB: Feb 20, 1933  DOA: 10/19/2023 Date of Service: 10/27/23 which is hospital day 8  PCP: Lynnea Ferrier, MD    HPI: Tiffany [name] is a 89 y.o. female with medical history significant of chronic HFrEF with LVEF 30-35%, CAD, CKD stage IV, PAF status post recent cardioversion on Xarelto, ureteral stone status post stenting April 2024, chronic back pain, IDDM, obesity, presented to ED with worsening of right leg rash and pain.   Hospital course / significant events:  10/19/23: Admitted with cellulitis, HFrEF exacerbation, AKI on CKD 10/24: decision made to initiate hemodialysis 10/25: cath placement and initial HD treatment  10/26-10/30: stable, outpatient HD access is arranged, SNF rehab placement pending auth   Consultants:  Nephrology Vascular surgery   Procedures/Surgeries: Vascular surgeon placed permacath for hemodialysis on 10/22/2023      ASSESSMENT & PLAN:   Acute on chronic HFrEF decompensation Anasarca - improved Cardiorenal syndrome Echo showing EF 40 to 45% with grade 1 diastolic dysfunction Dialysis to manage fluid status  GDMT as able Outpatient follow-up with cardiology    Left renal cell carcinoma Recent left-sided hydronephrosis secondary to obstructing ureteral stone MRI abdomen obtained showing findings of left renal exophytic mass concerning for renal cell carcinoma Prior hospitalist has discussed the case with urology Dr. Cardell Peach who recommends outpatient follow-up Per nephrology, avoid EPO/Mircera due to the renal mass.   Urinary retention Showing failed voiding trial Plan discharged with Foley catheter   End-stage renal disease Hemodialysis was initiated during this hospitalization Continue hemodialysis according to nephrologist recommendation on Monday Wednesday Friday. Patient has outpatient dialysis chair   Recurrent right leg cellulitis with bacteremia Repeat blood cultures negative to  date Prior hospitalist noted curbside discussion with infectious disease Dr. Joylene Draft as well as ID pharmacist who have recommended antibiotic therapy up until 10/30/2023. At discharge patient may be switched to Bactrim (80 mg / 400 mg) 1 tab twice daily but not the double strength dose continuing Ceftriaxone for now pending discharge    Severe right knee pain secondary to osteoarthritis Status post intra-articular steroid injection by orthopedics Continue current pain regiment   Elevated lactic acid With fluid overload, suspect cardiorenal syndrome and intravascular depletion Treated underlying causes.   PAF Status post cardioversion in April of this year.  Patient denied any palpitations.  At this point unclear what causes her CHF decompensation.  Xarelto have been changed to Eliquis as patient is now on hemodialysis    IDDM with hyperglycemia, with insulin resistance as well as hypoglycemia Insulin adjusted for hypoglycemia Monitor glucose closely    Chronic LBBB Outpatient cardiology follow up.  Uric acid elevated  allopurinol   obese based on BMI: Body mass index is 33.9 kg/m.  Underweight - under 18.5  normal weight - 18.5 to 24.9 overweight - 25 to 29.9 obese - 30 or more   DVT prophylaxis: eliquis  IV fluids: no continuous IV fluids  Nutrition: diabetic diet Central lines / invasive devices: HD cath   Code Status: FULL CODE ACP documentation reviewed: none on file in [facility]  TOC needs: placement  Barriers to dispo / significant pending items: insurance auth for SNF              Subjective / Brief ROS:  Patient reports no cocnerns at this time Answered questions from husband  Denies CP/SOB.  Pain controlled.  Denies new weakness.  Tolerating diet.    Family Communication: husband at  [phone]        Height:       63.0 in Accession #:    [id number]       Weight:       188.7 lb Date of Birth:  [birth year]        BSA:          1.886 m Patient Age:    89 years         BP:           132/65 mmHg Patient Gender: F                HR:           72 bpm. Exam Location:  [facility] Procedure: 2D Echo Indications:     CHF- Acute Systolic I50.21  History:         Patient has prior history of Echocardiogram examinations.  Sonographer:     Elwin Sleight RDCS Referring Phys:  [id number] Loyce Dys Diagnosing Phys: Julien Nordmann MD  Sonographer Comments: Suboptimal parasternal window, suboptimal apical window and suboptimal subcostal window. Image acquisition challenging due to patient body habitus. IMPRESSIONS  1. Left ventricular ejection fraction, by estimation, is 40 to 45%. Left ventricular ejection fraction by PLAX is 42 %. The left ventricle has mildly decreased function. The left ventricle demonstrates global hypokinesis.  Left ventricular diastolic parameters are consistent with Grade I diastolic dysfunction (impaired relaxation).  2. Right ventricular systolic function is normal. The right ventricular size is normal. There is moderately elevated pulmonary artery systolic pressure. The estimated right ventricular systolic pressure is 54.6 mmHg.  3. The mitral valve is normal in structure. Mild mitral valve regurgitation. No evidence of mitral stenosis.  4. The aortic valve has an indeterminant number of cusps. Aortic valve regurgitation is not visualized. Aortic valve sclerosis is present, with no evidence of aortic valve stenosis.  5. The inferior vena cava is normal in size with greater than 50% respiratory variability, suggesting right atrial pressure of 3 mmHg. FINDINGS  Left Ventricle: Left ventricular ejection fraction, by estimation, is 40 to 45%. Left ventricular ejection fraction by PLAX is 42 %. The left ventricle has mildly decreased function. The left ventricle demonstrates global hypokinesis. The left ventricular internal cavity size was normal in size. There is no left ventricular hypertrophy. Left ventricular diastolic parameters are consistent with Grade I diastolic dysfunction (impaired relaxation). Right Ventricle: The right ventricular size is normal. No increase in right ventricular wall thickness. Right ventricular systolic function is normal. There is moderately elevated pulmonary artery systolic pressure. The tricuspid regurgitant velocity is 3.52 m/s, and with an assumed right atrial pressure of 5 mmHg, the estimated right ventricular systolic pressure is 54.6 mmHg. Left Atrium: Left atrial size was normal in size. Right Atrium: Right atrial size was normal in size. Pericardium: There is no evidence of pericardial effusion. Mitral Valve: The mitral valve is normal in structure. Mild mitral valve regurgitation. No evidence of mitral valve stenosis. Tricuspid Valve: The tricuspid valve is normal in structure.  Tricuspid valve regurgitation is mild . No evidence of tricuspid stenosis. Aortic Valve: The aortic valve has an indeterminant number of cusps. Aortic valve regurgitation is not visualized. Aortic valve sclerosis is present, with no evidence of aortic valve stenosis. Aortic valve peak gradient measures 8.4 mmHg. Pulmonic Valve: The pulmonic valve was normal in structure. Pulmonic valve regurgitation is not visualized. No evidence of pulmonic stenosis. Aorta: The aortic root is normal in size and structure. Venous: The  PROGRESS NOTE    Rue Krabbenhoft   FAO:[id number] DOB: Feb 20, 1933  DOA: 10/19/2023 Date of Service: 10/27/23 which is hospital day 8  PCP: Lynnea Ferrier, MD    HPI: Tiffany [name] is a 89 y.o. female with medical history significant of chronic HFrEF with LVEF 30-35%, CAD, CKD stage IV, PAF status post recent cardioversion on Xarelto, ureteral stone status post stenting April 2024, chronic back pain, IDDM, obesity, presented to ED with worsening of right leg rash and pain.   Hospital course / significant events:  10/19/23: Admitted with cellulitis, HFrEF exacerbation, AKI on CKD 10/24: decision made to initiate hemodialysis 10/25: cath placement and initial HD treatment  10/26-10/30: stable, outpatient HD access is arranged, SNF rehab placement pending auth   Consultants:  Nephrology Vascular surgery   Procedures/Surgeries: Vascular surgeon placed permacath for hemodialysis on 10/22/2023      ASSESSMENT & PLAN:   Acute on chronic HFrEF decompensation Anasarca - improved Cardiorenal syndrome Echo showing EF 40 to 45% with grade 1 diastolic dysfunction Dialysis to manage fluid status  GDMT as able Outpatient follow-up with cardiology    Left renal cell carcinoma Recent left-sided hydronephrosis secondary to obstructing ureteral stone MRI abdomen obtained showing findings of left renal exophytic mass concerning for renal cell carcinoma Prior hospitalist has discussed the case with urology Dr. Cardell Peach who recommends outpatient follow-up Per nephrology, avoid EPO/Mircera due to the renal mass.   Urinary retention Showing failed voiding trial Plan discharged with Foley catheter   End-stage renal disease Hemodialysis was initiated during this hospitalization Continue hemodialysis according to nephrologist recommendation on Monday Wednesday Friday. Patient has outpatient dialysis chair   Recurrent right leg cellulitis with bacteremia Repeat blood cultures negative to  date Prior hospitalist noted curbside discussion with infectious disease Dr. Joylene Draft as well as ID pharmacist who have recommended antibiotic therapy up until 10/30/2023. At discharge patient may be switched to Bactrim (80 mg / 400 mg) 1 tab twice daily but not the double strength dose continuing Ceftriaxone for now pending discharge    Severe right knee pain secondary to osteoarthritis Status post intra-articular steroid injection by orthopedics Continue current pain regiment   Elevated lactic acid With fluid overload, suspect cardiorenal syndrome and intravascular depletion Treated underlying causes.   PAF Status post cardioversion in April of this year.  Patient denied any palpitations.  At this point unclear what causes her CHF decompensation.  Xarelto have been changed to Eliquis as patient is now on hemodialysis    IDDM with hyperglycemia, with insulin resistance as well as hypoglycemia Insulin adjusted for hypoglycemia Monitor glucose closely    Chronic LBBB Outpatient cardiology follow up.  Uric acid elevated  allopurinol   obese based on BMI: Body mass index is 33.9 kg/m.  Underweight - under 18.5  normal weight - 18.5 to 24.9 overweight - 25 to 29.9 obese - 30 or more   DVT prophylaxis: eliquis  IV fluids: no continuous IV fluids  Nutrition: diabetic diet Central lines / invasive devices: HD cath   Code Status: FULL CODE ACP documentation reviewed: none on file in [facility]  TOC needs: placement  Barriers to dispo / significant pending items: insurance auth for SNF              Subjective / Brief ROS:  Patient reports no cocnerns at this time Answered questions from husband  Denies CP/SOB.  Pain controlled.  Denies new weakness.  Tolerating diet.    Family Communication: husband at

## 2023-10-27 NOTE — Hospital Course (Addendum)
HPI: Tiffany [name] is a 89 y.o. female with medical history significant of chronic HFrEF with LVEF 30-35%, CAD, CKD stage IV, PAF status post recent cardioversion on Xarelto, ureteral stone status post stenting April 2024, chronic back pain, IDDM, obesity, presented to ED with worsening of right leg rash and pain.   Hospital course / significant events:  10/19/23: Admitted with cellulitis, HFrEF exacerbation, AKI on CKD 10/24: decision made to initiate hemodialysis 10/25: cath placement and initial HD treatment  10/26-10/30: stable, outpatient HD access is arranged, SNF rehab placement pending auth   Consultants:  Nephrology Vascular surgery   Procedures/Surgeries: Vascular surgeon placed permacath for hemodialysis on 10/22/2023      ASSESSMENT & PLAN:   Acute on chronic HFrEF decompensation Anasarca - improved Cardiorenal syndrome Echo showing EF 40 to 45% with grade 1 diastolic dysfunction Dialysis to manage fluid status  GDMT as able Outpatient follow-up with cardiology    Left renal cell carcinoma Recent left-sided hydronephrosis secondary to obstructing ureteral stone MRI abdomen obtained showing findings of left renal exophytic mass concerning for renal cell carcinoma Prior hospitalist has discussed the case with urology Dr. Cardell Peach who recommends outpatient follow-up Per nephrology, avoid EPO/Mircera due to the renal mass.   Urinary retention Showing failed voiding trial Plan discharged with Foley catheter   End-stage renal disease Hemodialysis was initiated during this hospitalization Continue hemodialysis according to nephrologist recommendation on Monday Wednesday Friday. Patient has outpatient dialysis chair   Recurrent right leg cellulitis with bacteremia Repeat blood cultures negative to date Prior hospitalist noted curbside discussion with infectious disease Dr. Joylene Draft as well as ID pharmacist who have recommended antibiotic therapy up until 10/30/2023. At  discharge patient may be switched to Bactrim (80 mg / 400 mg) 1 tab twice daily but not the double strength dose continuing Ceftriaxone for now pending discharge    Severe right knee pain secondary to osteoarthritis Status post intra-articular steroid injection by orthopedics Continue current pain regiment   Elevated lactic acid With fluid overload, suspect cardiorenal syndrome and intravascular depletion Treated underlying causes.   PAF Status post cardioversion in April of this year.  Patient denied any palpitations.  At this point unclear what causes her CHF decompensation.  Xarelto have been changed to Eliquis as patient is now on hemodialysis    IDDM with hyperglycemia, with insulin resistance as well as hypoglycemia Insulin adjusted for hypoglycemia Monitor glucose closely    Chronic LBBB Outpatient cardiology follow up.  Uric acid elevated  allopurinol   obese based on BMI: Body mass index is 33.9 kg/m.  Underweight - under 18.5  normal weight - 18.5 to 24.9 overweight - 25 to 29.9 obese - 30 or more   DVT prophylaxis: eliquis  IV fluids: no continuous IV fluids  Nutrition: diabetic diet Central lines / invasive devices: HD cath   Code Status: FULL CODE ACP documentation reviewed: none on file in [facility]  TOC needs: placement  Barriers to dispo / significant pending items: insurance auth for SNF

## 2023-10-27 NOTE — Progress Notes (Addendum)
[facility]  ROUNDING NOTE   Subjective:   Patient seen and evaluated during dialysis   HEMODIALYSIS FLOWSHEET:  Blood Flow Rate (mL/min): 399 mL/min Arterial Pressure (mmHg): -170.9 mmHg Venous Pressure (mmHg): 76.36 mmHg TMP (mmHg): 5.66 mmHg Ultrafiltration Rate (mL/min): 829 mL/min Dialysate Flow Rate (mL/min): 299 ml/min  No complaints to offer Pleasant demeanor    Objective:  Vital signs in last 24 hours:  Temp:  [97.5 F (36.4 C)-98.5 F (36.9 C)] 98.5 F (36.9 C) (10/30 0810) Pulse Rate:  [53-74] 74 (10/30 0850) Resp:  [18-24] 22 (10/30 0850) BP: (108-146)/(50-84) 114/84 (10/30 0850) SpO2:  [99 %-100 %] 100 % (10/30 0810) Weight:  [86.8 kg-88.1 kg] 86.8 kg (10/30 0810)  Weight change: 2.5 kg Filed Weights   10/25/23 1218 10/27/23 0449 10/27/23 0810  Weight: 83.2 kg 88.1 kg 86.8 kg    Intake/Output: I/O last 3 completed shifts: In: 540.5 [P.O.:240; I.V.:100.5; IV Piggyback:200] Out: 350 [Urine:350]   Intake/Output this shift:  No intake/output data recorded.  Physical Exam: General: NAD  Head: Normocephalic, atraumatic. Moist oral mucosal membranes  Eyes: Anicteric  Lungs:  Clear to auscultation  Heart: Regular rate and rhythm  Abdomen:  Soft, nontender  Extremities: Trace peripheral edema.  Neurologic: Alert, moving all four extremities  Skin: BLE erythema (Ace wrapped)  ACCESS Rt Chest permcath placed on 10/22/23    Basic Metabolic Panel: Recent Labs  Lab 10/23/23 0338 10/24/23 0332 10/25/23 0338 10/26/23 0521 10/27/23 0615  NA 135 135 133* 131* 131*  K 3.6 3.3* 4.6 4.0 3.8  CL 96* 99 97* 92* 95*  CO2 26 28 25 26 23   GLUCOSE 275* 130* 217* 314* 207*  BUN 50* 32* 48* 42* 68*  CREATININE 2.31* 1.93* 2.61* 2.38* 3.02*  CALCIUM 8.1* 8.0* 8.3* 8.2* 8.3*    Liver Function Tests: No results for input(s): "AST", "ALT", "ALKPHOS", "BILITOT", "PROT", "ALBUMIN" in the last 168 hours. No results for input(s): "LIPASE",  "AMYLASE" in the last 168 hours. No results for input(s): "AMMONIA" in the last 168 hours.  CBC: Recent Labs  Lab 10/23/23 0338 10/24/23 0332 10/25/23 0338 10/26/23 0521 10/27/23 0615  WBC 9.9 8.6 8.0 9.6 9.2  NEUTROABS 8.0* 5.8 6.7 7.6 7.1  HGB 10.5* 9.5* 10.5* 9.9* 10.6*  HCT 33.9* 30.7* 32.8* 31.0* 34.1*  MCV 86.7 86.2 83.7 84.0 86.8  PLT 169 167 201 247 249    Cardiac Enzymes: No results for input(s): "CKTOTAL", "CKMB", "CKMBINDEX", "TROPONINI" in the last 168 hours.   BNP: Invalid input(s): "POCBNP"  CBG: Recent Labs  Lab 10/26/23 0754 10/26/23 1202 10/26/23 1608 10/26/23 2019 10/26/23 2200  GLUCAP 283* 310* 350* 263* 223*    Microbiology: Results for orders placed or performed during the hospital encounter of 10/19/23  Blood culture (routine x 2)     Status: Abnormal   Collection Time: 10/19/23  9:10 AM   Specimen: BLOOD  Result Value Ref Range Status   Specimen Description   Final    BLOOD RIGHT WRIST Performed at [facility], [address] [place], [place] [address]    Special Requests   Final    BOTTLES DRAWN AEROBIC AND ANAEROBIC Blood Culture adequate volume Performed at [facility], [address] [place], [place] [address]    Culture  Setup Time   Final    Organism ID to follow GRAM NEGATIVE RODS ANAEROBIC BOTTLE ONLY CRITICAL RESULT CALLED TO, READ BACK BY AND VERIFIED WITH: JASON ROBBINS PHARMD @0000  10/20/23 ASW Performed  at [facility], [address] [place], [place] [address]    Culture KLEBSIELLA OXYTOCA (A)  Final   Report Status 10/22/2023 FINAL  Final   Organism ID, Bacteria KLEBSIELLA OXYTOCA  Final      Susceptibility   Klebsiella oxytoca - MIC*    AMPICILLIN >=32 RESISTANT Resistant     CEFEPIME <=0.12 SENSITIVE Sensitive     CEFTAZIDIME <=1 SENSITIVE Sensitive     CEFTRIAXONE <=0.25 SENSITIVE Sensitive     CIPROFLOXACIN <=0.25 SENSITIVE Sensitive     GENTAMICIN <=1 SENSITIVE Sensitive      IMIPENEM <=0.25 SENSITIVE Sensitive     TRIMETH/SULFA <=20 SENSITIVE Sensitive     AMPICILLIN/SULBACTAM 8 SENSITIVE Sensitive     PIP/TAZO <=4 SENSITIVE Sensitive ug/mL    * KLEBSIELLA OXYTOCA  Blood Culture ID Panel (Reflexed)     Status: Abnormal   Collection Time: 10/19/23  9:10 AM  Result Value Ref Range Status   Enterococcus faecalis NOT DETECTED NOT DETECTED Final   Enterococcus Faecium NOT DETECTED NOT DETECTED Final   Listeria monocytogenes NOT DETECTED NOT DETECTED Final   Staphylococcus species NOT DETECTED NOT DETECTED Final   Staphylococcus aureus (BCID) NOT DETECTED NOT DETECTED Final   Staphylococcus epidermidis NOT DETECTED NOT DETECTED Final   Staphylococcus lugdunensis NOT DETECTED NOT DETECTED Final   Streptococcus species NOT DETECTED NOT DETECTED Final   Streptococcus agalactiae NOT DETECTED NOT DETECTED Final   Streptococcus pneumoniae NOT DETECTED NOT DETECTED Final   Streptococcus pyogenes NOT DETECTED NOT DETECTED Final   A.calcoaceticus-baumannii NOT DETECTED NOT DETECTED Final   Bacteroides fragilis NOT DETECTED NOT DETECTED Final   Enterobacterales DETECTED (A) NOT DETECTED Final    Comment: Enterobacterales represent a large order of gram negative bacteria, not a single organism. CRITICAL RESULT CALLED TO, READ BACK BY AND VERIFIED WITH: JASON ROBBINS PHARMD @0000  10/20/23 ASW    Enterobacter cloacae complex NOT DETECTED NOT DETECTED Final   Escherichia coli NOT DETECTED NOT DETECTED Final   Klebsiella aerogenes NOT DETECTED NOT DETECTED Final   Klebsiella oxytoca DETECTED (A) NOT DETECTED Final    Comment: CRITICAL RESULT CALLED TO, READ BACK BY AND VERIFIED WITH: JASON ROBBINS PHARMD @0000  10/20/23 ASW    Klebsiella pneumoniae NOT DETECTED NOT DETECTED Final   Proteus species NOT DETECTED NOT DETECTED Final   Salmonella species NOT DETECTED NOT DETECTED Final   Serratia marcescens NOT DETECTED NOT DETECTED Final   Haemophilus influenzae NOT  DETECTED NOT DETECTED Final   Neisseria meningitidis NOT DETECTED NOT DETECTED Final   Pseudomonas aeruginosa NOT DETECTED NOT DETECTED Final   Stenotrophomonas maltophilia NOT DETECTED NOT DETECTED Final   Candida albicans NOT DETECTED NOT DETECTED Final   Candida auris NOT DETECTED NOT DETECTED Final   Candida glabrata NOT DETECTED NOT DETECTED Final   Candida krusei NOT DETECTED NOT DETECTED Final   Candida parapsilosis NOT DETECTED NOT DETECTED Final   Candida tropicalis NOT DETECTED NOT DETECTED Final   Cryptococcus neoformans/gattii NOT DETECTED NOT DETECTED Final   CTX-M ESBL NOT DETECTED NOT DETECTED Final   Carbapenem resistance IMP NOT DETECTED NOT DETECTED Final   Carbapenem resistance KPC NOT DETECTED NOT DETECTED Final   Carbapenem resistance NDM NOT DETECTED NOT DETECTED Final   Carbapenem resist OXA 48 LIKE NOT DETECTED NOT DETECTED Final   Carbapenem resistance VIM NOT DETECTED NOT DETECTED Final    Comment: Performed at [facility], [address] [place], [place] [address]  Blood culture (routine x 2)  Status: None   Collection Time: 10/19/23 11:45 AM   Specimen: BLOOD  Result Value Ref Range Status   Specimen Description BLOOD LEFT ANTECUBITAL  Final   Special Requests   Final    BOTTLES DRAWN AEROBIC AND ANAEROBIC Blood Culture adequate volume   Culture   Final    NO GROWTH 5 DAYS Performed at [facility], [address] [place], [place] [address]    Report Status 10/24/2023 FINAL  Final  Group A Strep by PCR     Status: None   Collection Time: 10/19/23  4:15 PM   Specimen: Throat; Sterile Swab  Result Value Ref Range Status   Group A Strep by PCR NOT DETECTED NOT DETECTED Final    Comment: Performed at [facility], [address] [place], [place] [address]  MRSA Next Gen by PCR, Nasal     Status: Abnormal   Collection Time: 10/21/23  6:15 PM   Specimen: Nasal Mucosa; Nasal Swab  Result Value Ref Range Status   MRSA by  PCR Next Gen DETECTED (A) NOT DETECTED Final    Comment: RESULT CALLED TO, READ BACK BY AND VERIFIED WITH: YASMINE SORIANO @1935  ON 10/21/23 SKL (NOTE) The GeneXpert MRSA Assay (FDA approved for NASAL specimens only), is one component of a comprehensive MRSA colonization surveillance program. It is not intended to diagnose MRSA infection nor to guide or monitor treatment for MRSA infections. Test performance is not FDA approved in patients less than 9 years old. Performed at [facility], [address] [place], [place] [address]   Culture, blood (Routine X 2) w Reflex to ID Panel     Status: None (Preliminary result)   Collection Time: 10/23/23  3:10 PM   Specimen: BLOOD  Result Value Ref Range Status   Specimen Description BLOOD BLOOD RIGHT HAND  Final   Special Requests   Final    BOTTLES DRAWN AEROBIC AND ANAEROBIC Blood Culture adequate volume   Culture   Final    NO GROWTH 3 DAYS Performed at [facility], [address] [place], [place] [address]    Report Status PENDING  Incomplete  Culture, blood (Routine X 2) w Reflex to ID Panel     Status: None (Preliminary result)   Collection Time: 10/23/23  3:17 PM   Specimen: BLOOD  Result Value Ref Range Status   Specimen Description BLOOD BLOOD LEFT HAND  Final   Special Requests   Final    BOTTLES DRAWN AEROBIC AND ANAEROBIC Blood Culture results may not be optimal due to an excessive volume of blood received in culture bottles   Culture   Final    NO GROWTH 3 DAYS Performed at [facility], [address], [place], [place] [address]    Report Status PENDING  Incomplete    Coagulation Studies: No results for input(s): "LABPROT", "INR" in the last 72 hours.  Urinalysis: No results for input(s): "COLORURINE", "LABSPEC", "PHURINE", "GLUCOSEU", "HGBUR", "BILIRUBINUR", "KETONESUR", "PROTEINUR", "UROBILINOGEN", "NITRITE", "LEUKOCYTESUR" in the last 72 hours.  Invalid input(s): "APPERANCEUR"     Imaging: No results found.   Medications:    cefTRIAXone (ROCEPHIN)  IV Stopped (10/26/23 1144)    (feeding supplement) PROSource Plus  30 mL Oral TID BM   allopurinol  50 mg Oral Daily   amiodarone  200 mg Oral Daily   apixaban  2.5 mg Oral BID   bupivacaine(PF)  10 mL Infiltration Once   Chlorhexidine Gluconate Cloth  6 each Topical  Q0600   insulin aspart  0-15 Units Subcutaneous TID WC   insulin aspart  0-5 Units Subcutaneous QHS   insulin glargine-yfgn  8 Units Subcutaneous Daily   linagliptin  5 mg Oral Daily   metoprolol succinate  12.5 mg Oral BID   multivitamin  1 tablet Oral QHS   mupirocin ointment  1 Application Nasal BID   sodium chloride flush  10 mL Intravenous Q12H   sodium chloride flush  3 mL Intravenous Q12H   torsemide  20 mg Oral Q T,Th,Sat-1800   triamcinolone acetonide  80 mg Intra-articular Once   acetaminophen, HYDROcodone-acetaminophen, ondansetron (ZOFRAN) IV, polyethylene glycol, sodium chloride flush  Assessment/ Plan:  [name] is a 89 y.o.  female with past medical history including chronic heart failure, A-fib with recent cardioversion on Xarelto, CAD, and chronic kidney disease stage IV-5, who was admitted to [facility] on 10/19/2023 for CHF (congestive heart failure) (HCC) [I50.9] Bilateral leg pain [M79.604, M79.605] AKI (acute kidney injury) (HCC) [N17.9] Cellulitis, unspecified cellulitis site [L03.90]   End stage renal disease requiring hemodialysis. We feel with progressive renal function and failed outpatient diuretic therapy, patient is now deemed end stage renal disease.   Receiving dialysis today, UF goal 1.5L as tolerated. Next treatment scheduled for Friday.  Patient awaiting insurance authorization for rehab discharge.  Appreciate renal navigator confirming outpatient dialysis clinic at [facility] on a MWF schedule.  Lab Results  Component Value Date   CREATININE 3.02 (H) 10/27/2023   CREATININE 2.38 (H)  10/26/2023   CREATININE 2.61 (H) 10/25/2023    Intake/Output Summary (Last 24 hours) at 10/27/2023 0927 Last data filed at 10/27/2023 0600 Gross per 24 hour  Intake 540.5 ml  Output 350 ml  Net 190.5 ml   2.  Acute on chronic combined heart failure.  Last echo on 11/06/2021 shows EF 30 to 35% with a grade 2 diastolic dysfunction, moderate mitral valve regurgitation.    Volume status improved with dialysis  Torsemide 20mg  daily on nonHD days   3. Diabetes mellitus type II with chronic kidney disease/renal manifestations: insulin dependent. Home regimen includes Lantus, NovoLog and glimepiride. Most recent hemoglobin A1c is 7.4 on 08/27/22.   Primary team to manage sliding scale insulin.  4.  Left kidney renal mass, seen on MRI abdomen with/without contrast measuring 1.7 to 1.4 cm, consistent with small renal cell carcinoma.  Will follow-up with urology outpatient.  5. Anemia of chronic kidney disease  Lab Results  Component Value Date   HGB 10.6 (L) 10/27/2023    Hgb remains acceptable. No need for ESA at this time   LOS: 8 Tiffany [name] 10/30/20249:27 AM

## 2023-10-27 NOTE — Telephone Encounter (Signed)
Called pt and husband no answer. Left message advising them to call back. Information from Dr. Richardo Hanks also sent via mychart.

## 2023-10-27 NOTE — Plan of Care (Signed)
  Problem: Education: Goal: Ability to describe self-care measures that may prevent or decrease complications (Diabetes Survival Skills Education) will improve Outcome: Progressing Goal: Individualized Educational Video(s) Outcome: Progressing   Problem: Coping: Goal: Ability to adjust to condition or change in health will improve Outcome: Progressing   Problem: Fluid Volume: Goal: Ability to maintain a balanced intake and output will improve Outcome: Progressing   Problem: Health Behavior/Discharge Planning: Goal: Ability to identify and utilize available resources and services will improve Outcome: Progressing Goal: Ability to manage health-related needs will improve Outcome: Progressing   Problem: Metabolic: Goal: Ability to maintain appropriate glucose levels will improve Outcome: Progressing   Problem: Nutritional: Goal: Maintenance of adequate nutrition will improve Outcome: Progressing Goal: Progress toward achieving an optimal weight will improve Outcome: Progressing   Problem: Skin Integrity: Goal: Risk for impaired skin integrity will decrease Outcome: Progressing   Problem: Tissue Perfusion: Goal: Adequacy of tissue perfusion will improve Outcome: Progressing   Problem: Education: Goal: Knowledge of General Education information will improve Description: Including pain rating scale, medication(s)/side effects and non-pharmacologic comfort measures Outcome: Progressing   Problem: Health Behavior/Discharge Planning: Goal: Ability to manage health-related needs will improve Outcome: Progressing   Problem: Clinical Measurements: Goal: Ability to maintain clinical measurements within normal limits will improve Outcome: Progressing Goal: Will remain free from infection Outcome: Progressing Goal: Diagnostic test results will improve Outcome: Progressing Goal: Respiratory complications will improve Outcome: Progressing Goal: Cardiovascular complication will  be avoided Outcome: Progressing   Problem: Activity: Goal: Risk for activity intolerance will decrease Outcome: Progressing   Problem: Nutrition: Goal: Adequate nutrition will be maintained Outcome: Progressing   Problem: Coping: Goal: Level of anxiety will decrease Outcome: Progressing   Problem: Elimination: Goal: Will not experience complications related to bowel motility Outcome: Progressing Goal: Will not experience complications related to urinary retention Outcome: Progressing   Problem: Pain Management: Goal: General experience of comfort will improve Outcome: Progressing   Problem: Safety: Goal: Ability to remain free from injury will improve Outcome: Progressing   Problem: Skin Integrity: Goal: Risk for impaired skin integrity will decrease Outcome: Progressing   Problem: Education: Goal: Knowledge of disease and its progression will improve Outcome: Progressing   Problem: Fluid Volume: Goal: Compliance with measures to maintain balanced fluid volume will improve Outcome: Progressing   Problem: Health Behavior/Discharge Planning: Goal: Ability to manage health-related needs will improve Outcome: Progressing   Problem: Nutritional: Goal: Ability to make healthy dietary choices will improve Outcome: Progressing   Problem: Clinical Measurements: Goal: Complications related to the disease process, condition or treatment will be avoided or minimized Outcome: Progressing

## 2023-10-27 NOTE — Progress Notes (Signed)
Received patient in bed to unit.    Informed consent signed and in chart.    TX duration: 3.5 hrs     Transported back to floor  Hand-off given to patient's nurse.   Access used:  rt chest cvc Access issues: n/a  Total UF removed: 1.9 L Post HD weight: 83.2kg      Maple Hudson, RN Dialysis Unit

## 2023-10-27 NOTE — Care Management Important Message (Signed)
Important Message  Patient Details  Name: Tiffany [name] MRN: [id number] Date of Birth: [birth year]   Important Message Given:  Yes - Medicare IM     Verita Schneiders Yer Olivencia 10/27/2023, 12:38 PM

## 2023-10-27 NOTE — TOC Progression Note (Addendum)
Transition of Care [facility]) - Progression Note    Patient Details  Name: Noreli Freyman MRN: [id number] Date of Birth: 02-Jun-1933  Transition of Care [facility]) CM/SW Contact  Truddie Hidden, RN Phone Number: 10/27/2023, 2:50 PM  Clinical Narrative:    Retrieved message left from [place] at [facility]. Patient is approved for SNF for 7 days approval # [id number] EMS approval # [phone]. MD notified.   Spoke with Sue Lush, Admissions Coordinator to advise of auth approval.           Expected Discharge Plan and Services                                               Social Determinants of Health (SDOH) Interventions SDOH Screenings   Food Insecurity: No Food Insecurity (10/19/2023)  Housing: Low Risk  (10/19/2023)  Transportation Needs: No Transportation Needs (10/19/2023)  Utilities: Not At Risk (10/19/2023)  Depression (PHQ2-9): Low Risk  (02/20/2019)  Financial Resource Strain: Low Risk  (09/29/2023)   Received from [facility]  Tobacco Use: Low Risk  (10/19/2023)    Readmission Risk Interventions     No data to display

## 2023-10-28 ENCOUNTER — Other Ambulatory Visit: Payer: Self-pay | Admitting: Nurse Practitioner

## 2023-10-28 DIAGNOSIS — G8929 Other chronic pain: Secondary | ICD-10-CM | POA: Diagnosis not present

## 2023-10-28 DIAGNOSIS — I2510 Atherosclerotic heart disease of native coronary artery without angina pectoris: Secondary | ICD-10-CM | POA: Diagnosis not present

## 2023-10-28 DIAGNOSIS — I509 Heart failure, unspecified: Secondary | ICD-10-CM | POA: Diagnosis not present

## 2023-10-28 DIAGNOSIS — D631 Anemia in chronic kidney disease: Secondary | ICD-10-CM | POA: Diagnosis not present

## 2023-10-28 DIAGNOSIS — I447 Left bundle-branch block, unspecified: Secondary | ICD-10-CM | POA: Diagnosis not present

## 2023-10-28 DIAGNOSIS — N186 End stage renal disease: Secondary | ICD-10-CM | POA: Diagnosis not present

## 2023-10-28 DIAGNOSIS — N132 Hydronephrosis with renal and ureteral calculous obstruction: Secondary | ICD-10-CM | POA: Diagnosis not present

## 2023-10-28 DIAGNOSIS — Z9181 History of falling: Secondary | ICD-10-CM | POA: Diagnosis not present

## 2023-10-28 DIAGNOSIS — E790 Hyperuricemia without signs of inflammatory arthritis and tophaceous disease: Secondary | ICD-10-CM | POA: Diagnosis not present

## 2023-10-28 DIAGNOSIS — I480 Paroxysmal atrial fibrillation: Secondary | ICD-10-CM | POA: Diagnosis not present

## 2023-10-28 DIAGNOSIS — I959 Hypotension, unspecified: Secondary | ICD-10-CM | POA: Diagnosis not present

## 2023-10-28 DIAGNOSIS — N179 Acute kidney failure, unspecified: Secondary | ICD-10-CM | POA: Diagnosis not present

## 2023-10-28 DIAGNOSIS — R601 Generalized edema: Secondary | ICD-10-CM | POA: Diagnosis not present

## 2023-10-28 DIAGNOSIS — L03115 Cellulitis of right lower limb: Secondary | ICD-10-CM | POA: Diagnosis not present

## 2023-10-28 DIAGNOSIS — I482 Chronic atrial fibrillation, unspecified: Secondary | ICD-10-CM | POA: Diagnosis not present

## 2023-10-28 DIAGNOSIS — M549 Dorsalgia, unspecified: Secondary | ICD-10-CM | POA: Diagnosis not present

## 2023-10-28 DIAGNOSIS — M25562 Pain in left knee: Secondary | ICD-10-CM | POA: Diagnosis not present

## 2023-10-28 DIAGNOSIS — R7881 Bacteremia: Secondary | ICD-10-CM | POA: Diagnosis not present

## 2023-10-28 DIAGNOSIS — R2689 Other abnormalities of gait and mobility: Secondary | ICD-10-CM | POA: Diagnosis not present

## 2023-10-28 DIAGNOSIS — C642 Malignant neoplasm of left kidney, except renal pelvis: Secondary | ICD-10-CM | POA: Diagnosis not present

## 2023-10-28 DIAGNOSIS — Z992 Dependence on renal dialysis: Secondary | ICD-10-CM | POA: Diagnosis not present

## 2023-10-28 DIAGNOSIS — Z466 Encounter for fitting and adjustment of urinary device: Secondary | ICD-10-CM | POA: Diagnosis not present

## 2023-10-28 DIAGNOSIS — M1711 Unilateral primary osteoarthritis, right knee: Secondary | ICD-10-CM | POA: Diagnosis not present

## 2023-10-28 DIAGNOSIS — I5023 Acute on chronic systolic (congestive) heart failure: Secondary | ICD-10-CM | POA: Diagnosis not present

## 2023-10-28 DIAGNOSIS — N184 Chronic kidney disease, stage 4 (severe): Secondary | ICD-10-CM | POA: Diagnosis not present

## 2023-10-28 DIAGNOSIS — M6281 Muscle weakness (generalized): Secondary | ICD-10-CM | POA: Diagnosis not present

## 2023-10-28 DIAGNOSIS — L03119 Cellulitis of unspecified part of limb: Secondary | ICD-10-CM | POA: Diagnosis not present

## 2023-10-28 DIAGNOSIS — N2889 Other specified disorders of kidney and ureter: Secondary | ICD-10-CM | POA: Diagnosis not present

## 2023-10-28 DIAGNOSIS — E1122 Type 2 diabetes mellitus with diabetic chronic kidney disease: Secondary | ICD-10-CM | POA: Diagnosis not present

## 2023-10-28 DIAGNOSIS — R339 Retention of urine, unspecified: Secondary | ICD-10-CM | POA: Diagnosis not present

## 2023-10-28 DIAGNOSIS — Z7401 Bed confinement status: Secondary | ICD-10-CM | POA: Diagnosis not present

## 2023-10-28 DIAGNOSIS — M1A9XX0 Chronic gout, unspecified, without tophus (tophi): Secondary | ICD-10-CM | POA: Diagnosis not present

## 2023-10-28 LAB — CULTURE, BLOOD (ROUTINE X 2)
Culture: NO GROWTH
Culture: NO GROWTH
Special Requests: ADEQUATE

## 2023-10-28 LAB — GLUCOSE, CAPILLARY
Glucose-Capillary: 233 mg/dL — ABNORMAL HIGH (ref 70–99)
Glucose-Capillary: 291 mg/dL — ABNORMAL HIGH (ref 70–99)

## 2023-10-28 MED ORDER — HYDROCODONE-ACETAMINOPHEN 5-325 MG PO TABS: 1.0000 | ORAL_TABLET | Freq: Four times a day (QID) | ORAL | 0 refills | Status: DC | PRN

## 2023-10-28 MED ORDER — ALLOPURINOL 100 MG PO TABS: 50.0000 mg | ORAL_TABLET | Freq: Every day | ORAL | Status: DC

## 2023-10-28 MED ORDER — RENA-VITE PO TABS: 1.0000 | ORAL_TABLET | Freq: Every day | ORAL | Status: DC

## 2023-10-28 MED ORDER — SULFAMETHOXAZOLE-TRIMETHOPRIM 400-80 MG PO TABS: 1.0000 | ORAL_TABLET | Freq: Two times a day (BID) | ORAL | Status: AC

## 2023-10-28 MED ORDER — ACETAMINOPHEN 500 MG PO TABS: 500.0000 mg | ORAL_TABLET | Freq: Three times a day (TID) | ORAL | Status: DC | PRN

## 2023-10-28 MED ORDER — INSULIN ASPART 100 UNIT/ML IJ SOLN: 0.0000 [IU] | Freq: Three times a day (TID) | INTRAMUSCULAR | Status: DC

## 2023-10-28 MED ORDER — PROSOURCE PLUS PO LIQD: 30.0000 mL | Freq: Three times a day (TID) | ORAL | Status: DC

## 2023-10-28 MED ORDER — METOPROLOL SUCCINATE ER 25 MG PO TB24: 12.5000 mg | ORAL_TABLET | Freq: Two times a day (BID) | ORAL | Status: DC

## 2023-10-28 MED ORDER — LINAGLIPTIN 5 MG PO TABS: 5.0000 mg | ORAL_TABLET | Freq: Every day | ORAL | Status: DC

## 2023-10-28 MED ORDER — TORSEMIDE 20 MG PO TABS: 20.0000 mg | ORAL_TABLET | ORAL | Status: DC

## 2023-10-28 MED ORDER — APIXABAN 2.5 MG PO TABS: 2.5000 mg | ORAL_TABLET | Freq: Two times a day (BID) | ORAL | Status: DC

## 2023-10-28 MED ORDER — INSULIN GLARGINE-YFGN 100 UNIT/ML ~~LOC~~ SOLN: 8.0000 [IU] | Freq: Every day | SUBCUTANEOUS | Status: DC

## 2023-10-28 MED ORDER — INSULIN ASPART 100 UNIT/ML IJ SOLN: 0.0000 [IU] | Freq: Every day | INTRAMUSCULAR | Status: DC

## 2023-10-28 MED ORDER — SULFAMETHOXAZOLE-TRIMETHOPRIM 400-80 MG PO TABS: 1.0000 | ORAL_TABLET | Freq: Two times a day (BID) | ORAL | Status: DC

## 2023-10-28 MED ORDER — POLYETHYLENE GLYCOL 3350 17 G PO PACK: 8.5000 g | PACK | Freq: Every day | ORAL | Status: DC | PRN

## 2023-10-28 NOTE — TOC Transition Note (Signed)
Transition of Care [facility]) - CM/SW Discharge Note   Patient Details  Name: Tiffany [name] MRN: [id number] Date of Birth: [birth year]  Transition of Care [facility]) CM/SW Contact:  Truddie Hidden, RN Phone Number: 10/28/2023, 10:34 AM   Clinical Narrative:    Tiffany [name] with Tiffany [name]  in admissions at [facility]  Per facility patient admission confirmed for today. Patient assigned room # 118 Nurse will call report to [phone] Face sheet and medical necessity forms printed to the floor to be added to the EMS pack EMS arranged for 1:30 pm Discharge summary and SNF transfer report sent in HUB.  Nurse, and family notified spoke spouse and with family member at bedside. TOC signing off.          Patient Goals and CMS Choice      Discharge Placement                         Discharge Plan and Services Additional resources added to the After Visit Summary for                                       Social Determinants of Health (SDOH) Interventions SDOH Screenings   Food Insecurity: No Food Insecurity (10/19/2023)  Housing: Low Risk  (10/19/2023)  Transportation Needs: No Transportation Needs (10/19/2023)  Utilities: Not At Risk (10/19/2023)  Depression (PHQ2-9): Low Risk  (02/20/2019)  Financial Resource Strain: Low Risk  (09/29/2023)   Received from [facility]  Tobacco Use: Low Risk  (10/19/2023)     Readmission Risk Interventions     No data to display

## 2023-10-28 NOTE — Discharge Summary (Addendum)
Physician Discharge Summary   Patient: Tiffany [name] MRN: [id number]  DOB: 30-Mar-1933   Admit:     Date of Admission: 10/19/2023 Admitted from: home   Discharge: Date of discharge: 10/28/23 Disposition: Skilled nursing facility Condition at discharge: good  CODE STATUS: FULL CODE     Discharge Physician: Sunnie Nielsen, DO [facility]     PCP: Lynnea Ferrier, MD  Recommendations for Outpatient Follow-up:  Follow up with PCP Curtis Sites III, MD in 1-2 weeks Please obtain labs/tests: CBC, BMP in 1-2 weeks Follow up with dialysis and w/ nephrology as directed -  outpatient dialysis clinic at [facility] on a MWF schedule. Follow up with cardiology in 1-2 weeks  Please follow up on the following pending results: none Start po antibiotics tomorrow morning - patient received ceftriaxone IV x1 today 10:05 AM  PCP AND OTHER OUTPATIENT PROVIDERS: SEE BELOW FOR SPECIFIC DISCHARGE INSTRUCTIONS PRINTED FOR PATIENT IN ADDITION TO GENERIC AVS PATIENT INFO    Discharge Instructions     Amb ref to Medical Nutrition Therapy-MNT   Complete by: As directed    Choose the type of MNT and number of hours: Initial MNT:  3 hours   Diet - low sodium heart healthy   Complete by: As directed    Diet Carb Modified   Complete by: As directed    Discharge wound care:   Complete by: As directed    Single layer of xeroform to any areas that are weeping, cover with foam. Change daily. Wrap bilateral LEs from toes to knees (important to wrap from toes to knees) with kerlix and 4"ACE wraps. Elevate LEs as much as possible as well.   Increase activity slowly   Complete by: As directed          Discharge Diagnoses: Principal Problem:   CHF (congestive heart failure) (HCC) Active Problems:   Acute on chronic congestive heart failure (HCC)   Cellulitis   AKI (acute kidney injury) (HCC)      HPI: Tiffany [name] is a 89 y.o. female with medical history  significant of chronic HFrEF with LVEF 30-35%, CAD, CKD stage IV, PAF status post recent cardioversion on Xarelto, ureteral stone status post stenting April 2024, chronic back pain, IDDM, obesity, presented to ED with worsening of right leg rash and pain.    Hospital course / significant events:  10/19/23: Admitted with cellulitis, HFrEF exacerbation, AKI on CKD 10/24: decision made to initiate hemodialysis 10/25: cath placement and initial HD treatment  10/26-10/30: stable, outpatient HD access is arranged, SNF rehab placement pending auth  10/31: auth obtained for SNF, discharge appropriate    Consultants:  Nephrology Vascular surgery    Procedures/Surgeries: Vascular surgeon placed permacath for hemodialysis on 10/22/2023           ASSESSMENT & PLAN:   Acute on chronic HFrEF decompensation Anasarca - improved Cardiorenal syndrome Echo showing EF 40 to 45% with grade 1 diastolic dysfunction Dialysis to manage fluid status  GDMT as able Outpatient follow-up with cardiology    Left renal cell carcinoma Recent left-sided hydronephrosis secondary to obstructing ureteral stone MRI abdomen obtained showing findings of left renal exophytic mass concerning for renal cell carcinoma Prior hospitalist has discussed the case with urology Dr. Cardell Peach who recommends outpatient follow-up Per nephrology, avoid EPO/Mircera due to the renal mass.    Urinary retention Showing failed voiding trial Plan discharged with Foley catheter    End-stage renal disease Hemodialysis was  received in culture bottles   Culture   Final    NO GROWTH 5 DAYS Performed at [facility], [address] [place], [place] [address]    Report Status 10/28/2023 FINAL  Final   Imaging ECHOCARDIOGRAM COMPLETE  Result Date: 10/24/2023    ECHOCARDIOGRAM REPORT   Patient Name:   Tiffany [name] Date of Exam: 10/24/2023 Medical Rec #:  [id number]        Height:       63.0 in Accession #:    [id number]       Weight:       188.7 lb Date of Birth:  1933/03/11        BSA:          1.886 m Patient Age:    89 years         BP:           132/65 mmHg Patient Gender: F                HR:           72 bpm. Exam Location:  [facility] Procedure: 2D Echo Indications:     CHF-  Acute Systolic I50.21  History:         Patient has prior history of Echocardiogram examinations.  Sonographer:     Elwin Sleight RDCS Referring Phys:  [id number] Loyce Dys Diagnosing Phys: Julien Nordmann MD  Sonographer Comments: Suboptimal parasternal window, suboptimal apical window and suboptimal subcostal window. Image acquisition challenging due to patient body habitus. IMPRESSIONS  1. Left ventricular ejection fraction, by estimation, is 40 to 45%. Left ventricular ejection fraction by PLAX is 42 %. The left ventricle has mildly decreased function. The left ventricle demonstrates global hypokinesis. Left ventricular diastolic parameters are consistent with Grade I diastolic dysfunction (impaired relaxation).  2. Right ventricular systolic function is normal. The right ventricular size is normal. There is moderately elevated pulmonary artery systolic pressure. The estimated right ventricular systolic pressure is 54.6 mmHg.  3. The mitral valve is normal in structure. Mild mitral valve regurgitation. No evidence of mitral stenosis.  4. The aortic valve has an indeterminant number of cusps. Aortic valve regurgitation is not visualized. Aortic valve sclerosis is present, with no evidence of aortic valve stenosis.  5. The inferior vena cava is normal in size with greater than 50% respiratory variability, suggesting right atrial pressure of 3 mmHg. FINDINGS  Left Ventricle: Left ventricular ejection fraction, by estimation, is 40 to 45%. Left ventricular ejection fraction by PLAX is 42 %. The left ventricle has mildly decreased function. The left ventricle demonstrates global hypokinesis. The left ventricular internal cavity size was normal in size. There is no left ventricular hypertrophy. Left ventricular diastolic parameters are consistent with Grade I diastolic dysfunction (impaired relaxation). Right Ventricle: The right ventricular size is normal. No increase in right ventricular wall thickness. Right  ventricular systolic function is normal. There is moderately elevated pulmonary artery systolic pressure. The tricuspid regurgitant velocity is 3.52 m/s, and with an assumed right atrial pressure of 5 mmHg, the estimated right ventricular systolic pressure is 54.6 mmHg. Left Atrium: Left atrial size was normal in size. Right Atrium: Right atrial size was normal in size. Pericardium: There is no evidence of pericardial effusion. Mitral Valve: The mitral valve is normal in structure. Mild mitral valve regurgitation. No evidence of mitral valve stenosis. Tricuspid Valve: The tricuspid valve is normal in structure. Tricuspid valve regurgitation is mild . No evidence of tricuspid  Physician Discharge Summary   Patient: Tiffany [name] MRN: [id number]  DOB: 30-Mar-1933   Admit:     Date of Admission: 10/19/2023 Admitted from: home   Discharge: Date of discharge: 10/28/23 Disposition: Skilled nursing facility Condition at discharge: good  CODE STATUS: FULL CODE     Discharge Physician: Sunnie Nielsen, DO [facility]     PCP: Lynnea Ferrier, MD  Recommendations for Outpatient Follow-up:  Follow up with PCP Curtis Sites III, MD in 1-2 weeks Please obtain labs/tests: CBC, BMP in 1-2 weeks Follow up with dialysis and w/ nephrology as directed -  outpatient dialysis clinic at [facility] on a MWF schedule. Follow up with cardiology in 1-2 weeks  Please follow up on the following pending results: none Start po antibiotics tomorrow morning - patient received ceftriaxone IV x1 today 10:05 AM  PCP AND OTHER OUTPATIENT PROVIDERS: SEE BELOW FOR SPECIFIC DISCHARGE INSTRUCTIONS PRINTED FOR PATIENT IN ADDITION TO GENERIC AVS PATIENT INFO    Discharge Instructions     Amb ref to Medical Nutrition Therapy-MNT   Complete by: As directed    Choose the type of MNT and number of hours: Initial MNT:  3 hours   Diet - low sodium heart healthy   Complete by: As directed    Diet Carb Modified   Complete by: As directed    Discharge wound care:   Complete by: As directed    Single layer of xeroform to any areas that are weeping, cover with foam. Change daily. Wrap bilateral LEs from toes to knees (important to wrap from toes to knees) with kerlix and 4"ACE wraps. Elevate LEs as much as possible as well.   Increase activity slowly   Complete by: As directed          Discharge Diagnoses: Principal Problem:   CHF (congestive heart failure) (HCC) Active Problems:   Acute on chronic congestive heart failure (HCC)   Cellulitis   AKI (acute kidney injury) (HCC)      HPI: Tiffany [name] is a 89 y.o. female with medical history  significant of chronic HFrEF with LVEF 30-35%, CAD, CKD stage IV, PAF status post recent cardioversion on Xarelto, ureteral stone status post stenting April 2024, chronic back pain, IDDM, obesity, presented to ED with worsening of right leg rash and pain.    Hospital course / significant events:  10/19/23: Admitted with cellulitis, HFrEF exacerbation, AKI on CKD 10/24: decision made to initiate hemodialysis 10/25: cath placement and initial HD treatment  10/26-10/30: stable, outpatient HD access is arranged, SNF rehab placement pending auth  10/31: auth obtained for SNF, discharge appropriate    Consultants:  Nephrology Vascular surgery    Procedures/Surgeries: Vascular surgeon placed permacath for hemodialysis on 10/22/2023           ASSESSMENT & PLAN:   Acute on chronic HFrEF decompensation Anasarca - improved Cardiorenal syndrome Echo showing EF 40 to 45% with grade 1 diastolic dysfunction Dialysis to manage fluid status  GDMT as able Outpatient follow-up with cardiology    Left renal cell carcinoma Recent left-sided hydronephrosis secondary to obstructing ureteral stone MRI abdomen obtained showing findings of left renal exophytic mass concerning for renal cell carcinoma Prior hospitalist has discussed the case with urology Dr. Cardell Peach who recommends outpatient follow-up Per nephrology, avoid EPO/Mircera due to the renal mass.    Urinary retention Showing failed voiding trial Plan discharged with Foley catheter    End-stage renal disease Hemodialysis was  received in culture bottles   Culture   Final    NO GROWTH 5 DAYS Performed at [facility], [address] [place], [place] [address]    Report Status 10/28/2023 FINAL  Final   Imaging ECHOCARDIOGRAM COMPLETE  Result Date: 10/24/2023    ECHOCARDIOGRAM REPORT   Patient Name:   Tiffany [name] Date of Exam: 10/24/2023 Medical Rec #:  [id number]        Height:       63.0 in Accession #:    [id number]       Weight:       188.7 lb Date of Birth:  1933/03/11        BSA:          1.886 m Patient Age:    89 years         BP:           132/65 mmHg Patient Gender: F                HR:           72 bpm. Exam Location:  [facility] Procedure: 2D Echo Indications:     CHF-  Acute Systolic I50.21  History:         Patient has prior history of Echocardiogram examinations.  Sonographer:     Elwin Sleight RDCS Referring Phys:  [id number] Loyce Dys Diagnosing Phys: Julien Nordmann MD  Sonographer Comments: Suboptimal parasternal window, suboptimal apical window and suboptimal subcostal window. Image acquisition challenging due to patient body habitus. IMPRESSIONS  1. Left ventricular ejection fraction, by estimation, is 40 to 45%. Left ventricular ejection fraction by PLAX is 42 %. The left ventricle has mildly decreased function. The left ventricle demonstrates global hypokinesis. Left ventricular diastolic parameters are consistent with Grade I diastolic dysfunction (impaired relaxation).  2. Right ventricular systolic function is normal. The right ventricular size is normal. There is moderately elevated pulmonary artery systolic pressure. The estimated right ventricular systolic pressure is 54.6 mmHg.  3. The mitral valve is normal in structure. Mild mitral valve regurgitation. No evidence of mitral stenosis.  4. The aortic valve has an indeterminant number of cusps. Aortic valve regurgitation is not visualized. Aortic valve sclerosis is present, with no evidence of aortic valve stenosis.  5. The inferior vena cava is normal in size with greater than 50% respiratory variability, suggesting right atrial pressure of 3 mmHg. FINDINGS  Left Ventricle: Left ventricular ejection fraction, by estimation, is 40 to 45%. Left ventricular ejection fraction by PLAX is 42 %. The left ventricle has mildly decreased function. The left ventricle demonstrates global hypokinesis. The left ventricular internal cavity size was normal in size. There is no left ventricular hypertrophy. Left ventricular diastolic parameters are consistent with Grade I diastolic dysfunction (impaired relaxation). Right Ventricle: The right ventricular size is normal. No increase in right ventricular wall thickness. Right  ventricular systolic function is normal. There is moderately elevated pulmonary artery systolic pressure. The tricuspid regurgitant velocity is 3.52 m/s, and with an assumed right atrial pressure of 5 mmHg, the estimated right ventricular systolic pressure is 54.6 mmHg. Left Atrium: Left atrial size was normal in size. Right Atrium: Right atrial size was normal in size. Pericardium: There is no evidence of pericardial effusion. Mitral Valve: The mitral valve is normal in structure. Mild mitral valve regurgitation. No evidence of mitral valve stenosis. Tricuspid Valve: The tricuspid valve is normal in structure. Tricuspid valve regurgitation is mild . No evidence of tricuspid  Physician Discharge Summary   Patient: Tiffany [name] MRN: [id number]  DOB: 30-Mar-1933   Admit:     Date of Admission: 10/19/2023 Admitted from: home   Discharge: Date of discharge: 10/28/23 Disposition: Skilled nursing facility Condition at discharge: good  CODE STATUS: FULL CODE     Discharge Physician: Sunnie Nielsen, DO [facility]     PCP: Lynnea Ferrier, MD  Recommendations for Outpatient Follow-up:  Follow up with PCP Curtis Sites III, MD in 1-2 weeks Please obtain labs/tests: CBC, BMP in 1-2 weeks Follow up with dialysis and w/ nephrology as directed -  outpatient dialysis clinic at [facility] on a MWF schedule. Follow up with cardiology in 1-2 weeks  Please follow up on the following pending results: none Start po antibiotics tomorrow morning - patient received ceftriaxone IV x1 today 10:05 AM  PCP AND OTHER OUTPATIENT PROVIDERS: SEE BELOW FOR SPECIFIC DISCHARGE INSTRUCTIONS PRINTED FOR PATIENT IN ADDITION TO GENERIC AVS PATIENT INFO    Discharge Instructions     Amb ref to Medical Nutrition Therapy-MNT   Complete by: As directed    Choose the type of MNT and number of hours: Initial MNT:  3 hours   Diet - low sodium heart healthy   Complete by: As directed    Diet Carb Modified   Complete by: As directed    Discharge wound care:   Complete by: As directed    Single layer of xeroform to any areas that are weeping, cover with foam. Change daily. Wrap bilateral LEs from toes to knees (important to wrap from toes to knees) with kerlix and 4"ACE wraps. Elevate LEs as much as possible as well.   Increase activity slowly   Complete by: As directed          Discharge Diagnoses: Principal Problem:   CHF (congestive heart failure) (HCC) Active Problems:   Acute on chronic congestive heart failure (HCC)   Cellulitis   AKI (acute kidney injury) (HCC)      HPI: Tiffany [name] is a 89 y.o. female with medical history  significant of chronic HFrEF with LVEF 30-35%, CAD, CKD stage IV, PAF status post recent cardioversion on Xarelto, ureteral stone status post stenting April 2024, chronic back pain, IDDM, obesity, presented to ED with worsening of right leg rash and pain.    Hospital course / significant events:  10/19/23: Admitted with cellulitis, HFrEF exacerbation, AKI on CKD 10/24: decision made to initiate hemodialysis 10/25: cath placement and initial HD treatment  10/26-10/30: stable, outpatient HD access is arranged, SNF rehab placement pending auth  10/31: auth obtained for SNF, discharge appropriate    Consultants:  Nephrology Vascular surgery    Procedures/Surgeries: Vascular surgeon placed permacath for hemodialysis on 10/22/2023           ASSESSMENT & PLAN:   Acute on chronic HFrEF decompensation Anasarca - improved Cardiorenal syndrome Echo showing EF 40 to 45% with grade 1 diastolic dysfunction Dialysis to manage fluid status  GDMT as able Outpatient follow-up with cardiology    Left renal cell carcinoma Recent left-sided hydronephrosis secondary to obstructing ureteral stone MRI abdomen obtained showing findings of left renal exophytic mass concerning for renal cell carcinoma Prior hospitalist has discussed the case with urology Dr. Cardell Peach who recommends outpatient follow-up Per nephrology, avoid EPO/Mircera due to the renal mass.    Urinary retention Showing failed voiding trial Plan discharged with Foley catheter    End-stage renal disease Hemodialysis was  Physician Discharge Summary   Patient: Tiffany [name] MRN: [id number]  DOB: 30-Mar-1933   Admit:     Date of Admission: 10/19/2023 Admitted from: home   Discharge: Date of discharge: 10/28/23 Disposition: Skilled nursing facility Condition at discharge: good  CODE STATUS: FULL CODE     Discharge Physician: Sunnie Nielsen, DO [facility]     PCP: Lynnea Ferrier, MD  Recommendations for Outpatient Follow-up:  Follow up with PCP Curtis Sites III, MD in 1-2 weeks Please obtain labs/tests: CBC, BMP in 1-2 weeks Follow up with dialysis and w/ nephrology as directed -  outpatient dialysis clinic at [facility] on a MWF schedule. Follow up with cardiology in 1-2 weeks  Please follow up on the following pending results: none Start po antibiotics tomorrow morning - patient received ceftriaxone IV x1 today 10:05 AM  PCP AND OTHER OUTPATIENT PROVIDERS: SEE BELOW FOR SPECIFIC DISCHARGE INSTRUCTIONS PRINTED FOR PATIENT IN ADDITION TO GENERIC AVS PATIENT INFO    Discharge Instructions     Amb ref to Medical Nutrition Therapy-MNT   Complete by: As directed    Choose the type of MNT and number of hours: Initial MNT:  3 hours   Diet - low sodium heart healthy   Complete by: As directed    Diet Carb Modified   Complete by: As directed    Discharge wound care:   Complete by: As directed    Single layer of xeroform to any areas that are weeping, cover with foam. Change daily. Wrap bilateral LEs from toes to knees (important to wrap from toes to knees) with kerlix and 4"ACE wraps. Elevate LEs as much as possible as well.   Increase activity slowly   Complete by: As directed          Discharge Diagnoses: Principal Problem:   CHF (congestive heart failure) (HCC) Active Problems:   Acute on chronic congestive heart failure (HCC)   Cellulitis   AKI (acute kidney injury) (HCC)      HPI: Tiffany [name] is a 89 y.o. female with medical history  significant of chronic HFrEF with LVEF 30-35%, CAD, CKD stage IV, PAF status post recent cardioversion on Xarelto, ureteral stone status post stenting April 2024, chronic back pain, IDDM, obesity, presented to ED with worsening of right leg rash and pain.    Hospital course / significant events:  10/19/23: Admitted with cellulitis, HFrEF exacerbation, AKI on CKD 10/24: decision made to initiate hemodialysis 10/25: cath placement and initial HD treatment  10/26-10/30: stable, outpatient HD access is arranged, SNF rehab placement pending auth  10/31: auth obtained for SNF, discharge appropriate    Consultants:  Nephrology Vascular surgery    Procedures/Surgeries: Vascular surgeon placed permacath for hemodialysis on 10/22/2023           ASSESSMENT & PLAN:   Acute on chronic HFrEF decompensation Anasarca - improved Cardiorenal syndrome Echo showing EF 40 to 45% with grade 1 diastolic dysfunction Dialysis to manage fluid status  GDMT as able Outpatient follow-up with cardiology    Left renal cell carcinoma Recent left-sided hydronephrosis secondary to obstructing ureteral stone MRI abdomen obtained showing findings of left renal exophytic mass concerning for renal cell carcinoma Prior hospitalist has discussed the case with urology Dr. Cardell Peach who recommends outpatient follow-up Per nephrology, avoid EPO/Mircera due to the renal mass.    Urinary retention Showing failed voiding trial Plan discharged with Foley catheter    End-stage renal disease Hemodialysis was  received in culture bottles   Culture   Final    NO GROWTH 5 DAYS Performed at [facility], [address] [place], [place] [address]    Report Status 10/28/2023 FINAL  Final   Imaging ECHOCARDIOGRAM COMPLETE  Result Date: 10/24/2023    ECHOCARDIOGRAM REPORT   Patient Name:   Tiffany [name] Date of Exam: 10/24/2023 Medical Rec #:  [id number]        Height:       63.0 in Accession #:    [id number]       Weight:       188.7 lb Date of Birth:  1933/03/11        BSA:          1.886 m Patient Age:    89 years         BP:           132/65 mmHg Patient Gender: F                HR:           72 bpm. Exam Location:  [facility] Procedure: 2D Echo Indications:     CHF-  Acute Systolic I50.21  History:         Patient has prior history of Echocardiogram examinations.  Sonographer:     Elwin Sleight RDCS Referring Phys:  [id number] Loyce Dys Diagnosing Phys: Julien Nordmann MD  Sonographer Comments: Suboptimal parasternal window, suboptimal apical window and suboptimal subcostal window. Image acquisition challenging due to patient body habitus. IMPRESSIONS  1. Left ventricular ejection fraction, by estimation, is 40 to 45%. Left ventricular ejection fraction by PLAX is 42 %. The left ventricle has mildly decreased function. The left ventricle demonstrates global hypokinesis. Left ventricular diastolic parameters are consistent with Grade I diastolic dysfunction (impaired relaxation).  2. Right ventricular systolic function is normal. The right ventricular size is normal. There is moderately elevated pulmonary artery systolic pressure. The estimated right ventricular systolic pressure is 54.6 mmHg.  3. The mitral valve is normal in structure. Mild mitral valve regurgitation. No evidence of mitral stenosis.  4. The aortic valve has an indeterminant number of cusps. Aortic valve regurgitation is not visualized. Aortic valve sclerosis is present, with no evidence of aortic valve stenosis.  5. The inferior vena cava is normal in size with greater than 50% respiratory variability, suggesting right atrial pressure of 3 mmHg. FINDINGS  Left Ventricle: Left ventricular ejection fraction, by estimation, is 40 to 45%. Left ventricular ejection fraction by PLAX is 42 %. The left ventricle has mildly decreased function. The left ventricle demonstrates global hypokinesis. The left ventricular internal cavity size was normal in size. There is no left ventricular hypertrophy. Left ventricular diastolic parameters are consistent with Grade I diastolic dysfunction (impaired relaxation). Right Ventricle: The right ventricular size is normal. No increase in right ventricular wall thickness. Right  ventricular systolic function is normal. There is moderately elevated pulmonary artery systolic pressure. The tricuspid regurgitant velocity is 3.52 m/s, and with an assumed right atrial pressure of 5 mmHg, the estimated right ventricular systolic pressure is 54.6 mmHg. Left Atrium: Left atrial size was normal in size. Right Atrium: Right atrial size was normal in size. Pericardium: There is no evidence of pericardial effusion. Mitral Valve: The mitral valve is normal in structure. Mild mitral valve regurgitation. No evidence of mitral valve stenosis. Tricuspid Valve: The tricuspid valve is normal in structure. Tricuspid valve regurgitation is mild . No evidence of tricuspid  Physician Discharge Summary   Patient: Tiffany [name] MRN: [id number]  DOB: 30-Mar-1933   Admit:     Date of Admission: 10/19/2023 Admitted from: home   Discharge: Date of discharge: 10/28/23 Disposition: Skilled nursing facility Condition at discharge: good  CODE STATUS: FULL CODE     Discharge Physician: Sunnie Nielsen, DO [facility]     PCP: Lynnea Ferrier, MD  Recommendations for Outpatient Follow-up:  Follow up with PCP Curtis Sites III, MD in 1-2 weeks Please obtain labs/tests: CBC, BMP in 1-2 weeks Follow up with dialysis and w/ nephrology as directed -  outpatient dialysis clinic at [facility] on a MWF schedule. Follow up with cardiology in 1-2 weeks  Please follow up on the following pending results: none Start po antibiotics tomorrow morning - patient received ceftriaxone IV x1 today 10:05 AM  PCP AND OTHER OUTPATIENT PROVIDERS: SEE BELOW FOR SPECIFIC DISCHARGE INSTRUCTIONS PRINTED FOR PATIENT IN ADDITION TO GENERIC AVS PATIENT INFO    Discharge Instructions     Amb ref to Medical Nutrition Therapy-MNT   Complete by: As directed    Choose the type of MNT and number of hours: Initial MNT:  3 hours   Diet - low sodium heart healthy   Complete by: As directed    Diet Carb Modified   Complete by: As directed    Discharge wound care:   Complete by: As directed    Single layer of xeroform to any areas that are weeping, cover with foam. Change daily. Wrap bilateral LEs from toes to knees (important to wrap from toes to knees) with kerlix and 4"ACE wraps. Elevate LEs as much as possible as well.   Increase activity slowly   Complete by: As directed          Discharge Diagnoses: Principal Problem:   CHF (congestive heart failure) (HCC) Active Problems:   Acute on chronic congestive heart failure (HCC)   Cellulitis   AKI (acute kidney injury) (HCC)      HPI: Tiffany [name] is a 89 y.o. female with medical history  significant of chronic HFrEF with LVEF 30-35%, CAD, CKD stage IV, PAF status post recent cardioversion on Xarelto, ureteral stone status post stenting April 2024, chronic back pain, IDDM, obesity, presented to ED with worsening of right leg rash and pain.    Hospital course / significant events:  10/19/23: Admitted with cellulitis, HFrEF exacerbation, AKI on CKD 10/24: decision made to initiate hemodialysis 10/25: cath placement and initial HD treatment  10/26-10/30: stable, outpatient HD access is arranged, SNF rehab placement pending auth  10/31: auth obtained for SNF, discharge appropriate    Consultants:  Nephrology Vascular surgery    Procedures/Surgeries: Vascular surgeon placed permacath for hemodialysis on 10/22/2023           ASSESSMENT & PLAN:   Acute on chronic HFrEF decompensation Anasarca - improved Cardiorenal syndrome Echo showing EF 40 to 45% with grade 1 diastolic dysfunction Dialysis to manage fluid status  GDMT as able Outpatient follow-up with cardiology    Left renal cell carcinoma Recent left-sided hydronephrosis secondary to obstructing ureteral stone MRI abdomen obtained showing findings of left renal exophytic mass concerning for renal cell carcinoma Prior hospitalist has discussed the case with urology Dr. Cardell Peach who recommends outpatient follow-up Per nephrology, avoid EPO/Mircera due to the renal mass.    Urinary retention Showing failed voiding trial Plan discharged with Foley catheter    End-stage renal disease Hemodialysis was  received in culture bottles   Culture   Final    NO GROWTH 5 DAYS Performed at [facility], [address] [place], [place] [address]    Report Status 10/28/2023 FINAL  Final   Imaging ECHOCARDIOGRAM COMPLETE  Result Date: 10/24/2023    ECHOCARDIOGRAM REPORT   Patient Name:   Tiffany [name] Date of Exam: 10/24/2023 Medical Rec #:  [id number]        Height:       63.0 in Accession #:    [id number]       Weight:       188.7 lb Date of Birth:  1933/03/11        BSA:          1.886 m Patient Age:    89 years         BP:           132/65 mmHg Patient Gender: F                HR:           72 bpm. Exam Location:  [facility] Procedure: 2D Echo Indications:     CHF-  Acute Systolic I50.21  History:         Patient has prior history of Echocardiogram examinations.  Sonographer:     Elwin Sleight RDCS Referring Phys:  [id number] Loyce Dys Diagnosing Phys: Julien Nordmann MD  Sonographer Comments: Suboptimal parasternal window, suboptimal apical window and suboptimal subcostal window. Image acquisition challenging due to patient body habitus. IMPRESSIONS  1. Left ventricular ejection fraction, by estimation, is 40 to 45%. Left ventricular ejection fraction by PLAX is 42 %. The left ventricle has mildly decreased function. The left ventricle demonstrates global hypokinesis. Left ventricular diastolic parameters are consistent with Grade I diastolic dysfunction (impaired relaxation).  2. Right ventricular systolic function is normal. The right ventricular size is normal. There is moderately elevated pulmonary artery systolic pressure. The estimated right ventricular systolic pressure is 54.6 mmHg.  3. The mitral valve is normal in structure. Mild mitral valve regurgitation. No evidence of mitral stenosis.  4. The aortic valve has an indeterminant number of cusps. Aortic valve regurgitation is not visualized. Aortic valve sclerosis is present, with no evidence of aortic valve stenosis.  5. The inferior vena cava is normal in size with greater than 50% respiratory variability, suggesting right atrial pressure of 3 mmHg. FINDINGS  Left Ventricle: Left ventricular ejection fraction, by estimation, is 40 to 45%. Left ventricular ejection fraction by PLAX is 42 %. The left ventricle has mildly decreased function. The left ventricle demonstrates global hypokinesis. The left ventricular internal cavity size was normal in size. There is no left ventricular hypertrophy. Left ventricular diastolic parameters are consistent with Grade I diastolic dysfunction (impaired relaxation). Right Ventricle: The right ventricular size is normal. No increase in right ventricular wall thickness. Right  ventricular systolic function is normal. There is moderately elevated pulmonary artery systolic pressure. The tricuspid regurgitant velocity is 3.52 m/s, and with an assumed right atrial pressure of 5 mmHg, the estimated right ventricular systolic pressure is 54.6 mmHg. Left Atrium: Left atrial size was normal in size. Right Atrium: Right atrial size was normal in size. Pericardium: There is no evidence of pericardial effusion. Mitral Valve: The mitral valve is normal in structure. Mild mitral valve regurgitation. No evidence of mitral valve stenosis. Tricuspid Valve: The tricuspid valve is normal in structure. Tricuspid valve regurgitation is mild . No evidence of tricuspid  received in culture bottles   Culture   Final    NO GROWTH 5 DAYS Performed at [facility], [address] [place], [place] [address]    Report Status 10/28/2023 FINAL  Final   Imaging ECHOCARDIOGRAM COMPLETE  Result Date: 10/24/2023    ECHOCARDIOGRAM REPORT   Patient Name:   Tiffany [name] Date of Exam: 10/24/2023 Medical Rec #:  [id number]        Height:       63.0 in Accession #:    [id number]       Weight:       188.7 lb Date of Birth:  1933/03/11        BSA:          1.886 m Patient Age:    89 years         BP:           132/65 mmHg Patient Gender: F                HR:           72 bpm. Exam Location:  [facility] Procedure: 2D Echo Indications:     CHF-  Acute Systolic I50.21  History:         Patient has prior history of Echocardiogram examinations.  Sonographer:     Elwin Sleight RDCS Referring Phys:  [id number] Loyce Dys Diagnosing Phys: Julien Nordmann MD  Sonographer Comments: Suboptimal parasternal window, suboptimal apical window and suboptimal subcostal window. Image acquisition challenging due to patient body habitus. IMPRESSIONS  1. Left ventricular ejection fraction, by estimation, is 40 to 45%. Left ventricular ejection fraction by PLAX is 42 %. The left ventricle has mildly decreased function. The left ventricle demonstrates global hypokinesis. Left ventricular diastolic parameters are consistent with Grade I diastolic dysfunction (impaired relaxation).  2. Right ventricular systolic function is normal. The right ventricular size is normal. There is moderately elevated pulmonary artery systolic pressure. The estimated right ventricular systolic pressure is 54.6 mmHg.  3. The mitral valve is normal in structure. Mild mitral valve regurgitation. No evidence of mitral stenosis.  4. The aortic valve has an indeterminant number of cusps. Aortic valve regurgitation is not visualized. Aortic valve sclerosis is present, with no evidence of aortic valve stenosis.  5. The inferior vena cava is normal in size with greater than 50% respiratory variability, suggesting right atrial pressure of 3 mmHg. FINDINGS  Left Ventricle: Left ventricular ejection fraction, by estimation, is 40 to 45%. Left ventricular ejection fraction by PLAX is 42 %. The left ventricle has mildly decreased function. The left ventricle demonstrates global hypokinesis. The left ventricular internal cavity size was normal in size. There is no left ventricular hypertrophy. Left ventricular diastolic parameters are consistent with Grade I diastolic dysfunction (impaired relaxation). Right Ventricle: The right ventricular size is normal. No increase in right ventricular wall thickness. Right  ventricular systolic function is normal. There is moderately elevated pulmonary artery systolic pressure. The tricuspid regurgitant velocity is 3.52 m/s, and with an assumed right atrial pressure of 5 mmHg, the estimated right ventricular systolic pressure is 54.6 mmHg. Left Atrium: Left atrial size was normal in size. Right Atrium: Right atrial size was normal in size. Pericardium: There is no evidence of pericardial effusion. Mitral Valve: The mitral valve is normal in structure. Mild mitral valve regurgitation. No evidence of mitral valve stenosis. Tricuspid Valve: The tricuspid valve is normal in structure. Tricuspid valve regurgitation is mild . No evidence of tricuspid  Physician Discharge Summary   Patient: Tiffany [name] MRN: [id number]  DOB: 30-Mar-1933   Admit:     Date of Admission: 10/19/2023 Admitted from: home   Discharge: Date of discharge: 10/28/23 Disposition: Skilled nursing facility Condition at discharge: good  CODE STATUS: FULL CODE     Discharge Physician: Sunnie Nielsen, DO [facility]     PCP: Lynnea Ferrier, MD  Recommendations for Outpatient Follow-up:  Follow up with PCP Curtis Sites III, MD in 1-2 weeks Please obtain labs/tests: CBC, BMP in 1-2 weeks Follow up with dialysis and w/ nephrology as directed -  outpatient dialysis clinic at [facility] on a MWF schedule. Follow up with cardiology in 1-2 weeks  Please follow up on the following pending results: none Start po antibiotics tomorrow morning - patient received ceftriaxone IV x1 today 10:05 AM  PCP AND OTHER OUTPATIENT PROVIDERS: SEE BELOW FOR SPECIFIC DISCHARGE INSTRUCTIONS PRINTED FOR PATIENT IN ADDITION TO GENERIC AVS PATIENT INFO    Discharge Instructions     Amb ref to Medical Nutrition Therapy-MNT   Complete by: As directed    Choose the type of MNT and number of hours: Initial MNT:  3 hours   Diet - low sodium heart healthy   Complete by: As directed    Diet Carb Modified   Complete by: As directed    Discharge wound care:   Complete by: As directed    Single layer of xeroform to any areas that are weeping, cover with foam. Change daily. Wrap bilateral LEs from toes to knees (important to wrap from toes to knees) with kerlix and 4"ACE wraps. Elevate LEs as much as possible as well.   Increase activity slowly   Complete by: As directed          Discharge Diagnoses: Principal Problem:   CHF (congestive heart failure) (HCC) Active Problems:   Acute on chronic congestive heart failure (HCC)   Cellulitis   AKI (acute kidney injury) (HCC)      HPI: Tiffany [name] is a 89 y.o. female with medical history  significant of chronic HFrEF with LVEF 30-35%, CAD, CKD stage IV, PAF status post recent cardioversion on Xarelto, ureteral stone status post stenting April 2024, chronic back pain, IDDM, obesity, presented to ED with worsening of right leg rash and pain.    Hospital course / significant events:  10/19/23: Admitted with cellulitis, HFrEF exacerbation, AKI on CKD 10/24: decision made to initiate hemodialysis 10/25: cath placement and initial HD treatment  10/26-10/30: stable, outpatient HD access is arranged, SNF rehab placement pending auth  10/31: auth obtained for SNF, discharge appropriate    Consultants:  Nephrology Vascular surgery    Procedures/Surgeries: Vascular surgeon placed permacath for hemodialysis on 10/22/2023           ASSESSMENT & PLAN:   Acute on chronic HFrEF decompensation Anasarca - improved Cardiorenal syndrome Echo showing EF 40 to 45% with grade 1 diastolic dysfunction Dialysis to manage fluid status  GDMT as able Outpatient follow-up with cardiology    Left renal cell carcinoma Recent left-sided hydronephrosis secondary to obstructing ureteral stone MRI abdomen obtained showing findings of left renal exophytic mass concerning for renal cell carcinoma Prior hospitalist has discussed the case with urology Dr. Cardell Peach who recommends outpatient follow-up Per nephrology, avoid EPO/Mircera due to the renal mass.    Urinary retention Showing failed voiding trial Plan discharged with Foley catheter    End-stage renal disease Hemodialysis was

## 2023-10-28 NOTE — Progress Notes (Signed)
[facility]  ROUNDING NOTE   Subjective:   Patient sitting up in bed Alert and oriented Drinking coffee, husband at bedside  Lower extremity edema improved   Objective:  Vital signs in last 24 hours:  Temp:  [97.7 F (36.5 C)-98.4 F (36.9 C)] 97.7 F (36.5 C) (10/31 0822) Pulse Rate:  [57-79] 57 (10/31 0822) Resp:  [16-20] 19 (10/31 0822) BP: (99-133)/(60-76) 115/65 (10/31 0822) SpO2:  [97 %-98 %] 97 % (10/31 0822)  Weight change: -1.3 kg Filed Weights   10/25/23 1218 10/27/23 0449 10/27/23 0810  Weight: 83.2 kg 88.1 kg 86.8 kg    Intake/Output: I/O last 3 completed shifts: In: 300.5 [I.V.:100.5; IV Piggyback:200] Out: 2250 [Urine:350; Other:1900]   Intake/Output this shift:  No intake/output data recorded.  Physical Exam: General: NAD  Head: Normocephalic, atraumatic. Moist oral mucosal membranes  Eyes: Anicteric  Lungs:  Clear to auscultation  Heart: Regular rate and rhythm  Abdomen:  Soft, nontender  Extremities: Trace peripheral edema.  Neurologic: Alert, moving all four extremities  Skin: BLE erythema (Ace wrapped)  ACCESS Rt Chest permcath placed on 10/22/23    Basic Metabolic Panel: Recent Labs  Lab 10/23/23 0338 10/24/23 0332 10/25/23 0338 10/26/23 0521 10/27/23 0615  NA 135 135 133* 131* 131*  K 3.6 3.3* 4.6 4.0 3.8  CL 96* 99 97* 92* 95*  CO2 26 28 25 26 23   GLUCOSE 275* 130* 217* 314* 207*  BUN 50* 32* 48* 42* 68*  CREATININE 2.31* 1.93* 2.61* 2.38* 3.02*  CALCIUM 8.1* 8.0* 8.3* 8.2* 8.3*    Liver Function Tests: No results for input(s): "AST", "ALT", "ALKPHOS", "BILITOT", "PROT", "ALBUMIN" in the last 168 hours. No results for input(s): "LIPASE", "AMYLASE" in the last 168 hours. No results for input(s): "AMMONIA" in the last 168 hours.  CBC: Recent Labs  Lab 10/23/23 0338 10/24/23 0332 10/25/23 0338 10/26/23 0521 10/27/23 0615  WBC 9.9 8.6 8.0 9.6 9.2  NEUTROABS 8.0* 5.8 6.7 7.6 7.1  HGB 10.5* 9.5* 10.5*  9.9* 10.6*  HCT 33.9* 30.7* 32.8* 31.0* 34.1*  MCV 86.7 86.2 83.7 84.0 86.8  PLT 169 167 201 247 249    Cardiac Enzymes: No results for input(s): "CKTOTAL", "CKMB", "CKMBINDEX", "TROPONINI" in the last 168 hours.   BNP: Invalid input(s): "POCBNP"  CBG: Recent Labs  Lab 10/26/23 2200 10/27/23 1257 10/27/23 1530 10/27/23 2133 10/28/23 0820  GLUCAP 223* 203* 360* 362* 233*    Microbiology: Results for orders placed or performed during the hospital encounter of 10/19/23  Blood culture (routine x 2)     Status: Abnormal   Collection Time: 10/19/23  9:10 AM   Specimen: BLOOD  Result Value Ref Range Status   Specimen Description   Final    BLOOD RIGHT WRIST Performed at [facility], [address], [place], [place] [address]    Special Requests   Final    BOTTLES DRAWN AEROBIC AND ANAEROBIC Blood Culture adequate volume Performed at [facility], [address] [place], [place] [address]    Culture  Setup Time   Final    Organism ID to follow GRAM NEGATIVE RODS ANAEROBIC BOTTLE ONLY CRITICAL RESULT CALLED TO, READ BACK BY AND VERIFIED WITH: JASON ROBBINS PHARMD @0000  10/20/23 ASW Performed at [facility], [address] [place], [place] [address]    Culture KLEBSIELLA OXYTOCA (A)  Final   Report Status 10/22/2023 FINAL  Final   Organism ID, Bacteria KLEBSIELLA OXYTOCA  Final  Susceptibility   Klebsiella oxytoca - MIC*    AMPICILLIN >=32 RESISTANT Resistant     CEFEPIME <=0.12 SENSITIVE Sensitive     CEFTAZIDIME <=1 SENSITIVE Sensitive     CEFTRIAXONE <=0.25 SENSITIVE Sensitive     CIPROFLOXACIN <=0.25 SENSITIVE Sensitive     GENTAMICIN <=1 SENSITIVE Sensitive     IMIPENEM <=0.25 SENSITIVE Sensitive     TRIMETH/SULFA <=20 SENSITIVE Sensitive     AMPICILLIN/SULBACTAM 8 SENSITIVE Sensitive     PIP/TAZO <=4 SENSITIVE Sensitive ug/mL    * KLEBSIELLA OXYTOCA  Blood Culture ID Panel (Reflexed)     Status: Abnormal   Collection  Time: 10/19/23  9:10 AM  Result Value Ref Range Status   Enterococcus faecalis NOT DETECTED NOT DETECTED Final   Enterococcus Faecium NOT DETECTED NOT DETECTED Final   Listeria monocytogenes NOT DETECTED NOT DETECTED Final   Staphylococcus species NOT DETECTED NOT DETECTED Final   Staphylococcus aureus (BCID) NOT DETECTED NOT DETECTED Final   Staphylococcus epidermidis NOT DETECTED NOT DETECTED Final   Staphylococcus lugdunensis NOT DETECTED NOT DETECTED Final   Streptococcus species NOT DETECTED NOT DETECTED Final   Streptococcus agalactiae NOT DETECTED NOT DETECTED Final   Streptococcus pneumoniae NOT DETECTED NOT DETECTED Final   Streptococcus pyogenes NOT DETECTED NOT DETECTED Final   A.calcoaceticus-baumannii NOT DETECTED NOT DETECTED Final   Bacteroides fragilis NOT DETECTED NOT DETECTED Final   Enterobacterales DETECTED (A) NOT DETECTED Final    Comment: Enterobacterales represent a large order of gram negative bacteria, not a single organism. CRITICAL RESULT CALLED TO, READ BACK BY AND VERIFIED WITH: JASON ROBBINS PHARMD @0000  10/20/23 ASW    Enterobacter cloacae complex NOT DETECTED NOT DETECTED Final   Escherichia coli NOT DETECTED NOT DETECTED Final   Klebsiella aerogenes NOT DETECTED NOT DETECTED Final   Klebsiella oxytoca DETECTED (A) NOT DETECTED Final    Comment: CRITICAL RESULT CALLED TO, READ BACK BY AND VERIFIED WITH: JASON ROBBINS PHARMD @0000  10/20/23 ASW    Klebsiella pneumoniae NOT DETECTED NOT DETECTED Final   Proteus species NOT DETECTED NOT DETECTED Final   Salmonella species NOT DETECTED NOT DETECTED Final   Serratia marcescens NOT DETECTED NOT DETECTED Final   Haemophilus influenzae NOT DETECTED NOT DETECTED Final   Neisseria meningitidis NOT DETECTED NOT DETECTED Final   Pseudomonas aeruginosa NOT DETECTED NOT DETECTED Final   Stenotrophomonas maltophilia NOT DETECTED NOT DETECTED Final   Candida albicans NOT DETECTED NOT DETECTED Final   Candida auris  NOT DETECTED NOT DETECTED Final   Candida glabrata NOT DETECTED NOT DETECTED Final   Candida krusei NOT DETECTED NOT DETECTED Final   Candida parapsilosis NOT DETECTED NOT DETECTED Final   Candida tropicalis NOT DETECTED NOT DETECTED Final   Cryptococcus neoformans/gattii NOT DETECTED NOT DETECTED Final   CTX-M ESBL NOT DETECTED NOT DETECTED Final   Carbapenem resistance IMP NOT DETECTED NOT DETECTED Final   Carbapenem resistance KPC NOT DETECTED NOT DETECTED Final   Carbapenem resistance NDM NOT DETECTED NOT DETECTED Final   Carbapenem resist OXA 48 LIKE NOT DETECTED NOT DETECTED Final   Carbapenem resistance VIM NOT DETECTED NOT DETECTED Final    Comment: Performed at [facility], [address] [place], [place] [address]  Blood culture (routine x 2)     Status: None   Collection Time: 10/19/23 11:45 AM   Specimen: BLOOD  Result Value Ref Range Status   Specimen Description BLOOD LEFT ANTECUBITAL  Final   Special Requests   Final    BOTTLES DRAWN  AEROBIC AND ANAEROBIC Blood Culture adequate volume   Culture   Final    NO GROWTH 5 DAYS Performed at [facility], [address] [place], [place] [address]    Report Status 10/24/2023 FINAL  Final  Group A Strep by PCR     Status: None   Collection Time: 10/19/23  4:15 PM   Specimen: Throat; Sterile Swab  Result Value Ref Range Status   Group A Strep by PCR NOT DETECTED NOT DETECTED Final    Comment: Performed at [facility], [address] [place], [place] [address]  MRSA Next Gen by PCR, Nasal     Status: Abnormal   Collection Time: 10/21/23  6:15 PM   Specimen: Nasal Mucosa; Nasal Swab  Result Value Ref Range Status   MRSA by PCR Next Gen DETECTED (A) NOT DETECTED Final    Comment: RESULT CALLED TO, READ BACK BY AND VERIFIED WITH: YASMINE SORIANO @1935  ON 10/21/23 SKL (NOTE) The GeneXpert MRSA Assay (FDA approved for NASAL specimens only), is one component of a comprehensive MRSA colonization  surveillance program. It is not intended to diagnose MRSA infection nor to guide or monitor treatment for MRSA infections. Test performance is not FDA approved in patients less than 17 years old. Performed at [facility], [address] [place], [place] [address]   Culture, blood (Routine X 2) w Reflex to ID Panel     Status: None   Collection Time: 10/23/23  3:10 PM   Specimen: BLOOD  Result Value Ref Range Status   Specimen Description BLOOD BLOOD RIGHT HAND  Final   Special Requests   Final    BOTTLES DRAWN AEROBIC AND ANAEROBIC Blood Culture adequate volume   Culture   Final    NO GROWTH 5 DAYS Performed at [facility], [address] [place], [place] [address]    Report Status 10/28/2023 FINAL  Final  Culture, blood (Routine X 2) w Reflex to ID Panel     Status: None   Collection Time: 10/23/23  3:17 PM   Specimen: BLOOD  Result Value Ref Range Status   Specimen Description BLOOD BLOOD LEFT HAND  Final   Special Requests   Final    BOTTLES DRAWN AEROBIC AND ANAEROBIC Blood Culture results may not be optimal due to an excessive volume of blood received in culture bottles   Culture   Final    NO GROWTH 5 DAYS Performed at [facility], [address] [place], [place] [address]    Report Status 10/28/2023 FINAL  Final    Coagulation Studies: No results for input(s): "LABPROT", "INR" in the last 72 hours.  Urinalysis: No results for input(s): "COLORURINE", "LABSPEC", "PHURINE", "GLUCOSEU", "HGBUR", "BILIRUBINUR", "KETONESUR", "PROTEINUR", "UROBILINOGEN", "NITRITE", "LEUKOCYTESUR" in the last 72 hours.  Invalid input(s): "APPERANCEUR"    Imaging: No results found.   Medications:    cefTRIAXone (ROCEPHIN)  IV 2 g (10/28/23 0955)    (feeding supplement) PROSource Plus  30 mL Oral TID BM   allopurinol  50 mg Oral Daily   amiodarone  200 mg Oral Daily   apixaban  2.5 mg Oral BID   bupivacaine(PF)  10 mL Infiltration Once    Chlorhexidine Gluconate Cloth  6 each Topical Q0600   insulin aspart  0-15 Units Subcutaneous TID WC   insulin aspart  0-5 Units Subcutaneous QHS   insulin glargine-yfgn  8 Units Subcutaneous Daily   linagliptin  5 mg Oral Daily   metoprolol succinate  12.5 mg Oral BID   multivitamin  1 tablet Oral QHS   sodium chloride flush  10 mL Intravenous Q12H   sodium chloride flush  3 mL Intravenous Q12H   torsemide  20 mg Oral Q T,Th,Sat-1800   triamcinolone acetonide  80 mg Intra-articular Once   acetaminophen, HYDROcodone-acetaminophen, ondansetron (ZOFRAN) IV, polyethylene glycol, sodium chloride flush  Assessment/ Plan:  [name] is a 89 y.o.  female with past medical history including chronic heart failure, A-fib with recent cardioversion on Xarelto, CAD, and chronic kidney disease stage IV-5, who was admitted to [facility] on 10/19/2023 for CHF (congestive heart failure) (HCC) [I50.9] Bilateral leg pain [M79.604, M79.605] AKI (acute kidney injury) (HCC) [N17.9] Cellulitis, unspecified cellulitis site [L03.90]   End stage renal disease requiring hemodialysis. We feel with progressive renal function and failed outpatient diuretic therapy, patient is now deemed end stage renal disease.   Next treatment scheduled for Friday.  Patient awaiting insurance authorization for rehab discharge.  Appreciate renal navigator confirming outpatient dialysis clinic at [facility] on a MWF schedule.  Lab Results  Component Value Date   CREATININE 3.02 (H) 10/27/2023   CREATININE 2.38 (H) 10/26/2023   CREATININE 2.61 (H) 10/25/2023    Intake/Output Summary (Last 24 hours) at 10/28/2023 1133 Last data filed at 10/27/2023 1203 Gross per 24 hour  Intake --  Output 1900 ml  Net -1900 ml   2.  Acute on chronic combined heart failure.  Last echo on 11/06/2021 shows EF 30 to 35% with a grade 2 diastolic dysfunction, moderate mitral valve regurgitation.    Torsemide 20mg  daily on nonHD days    3. Diabetes mellitus type II with chronic kidney disease/renal manifestations: insulin dependent. Home regimen includes Lantus, NovoLog and glimepiride. Most recent hemoglobin A1c is 7.4 on 08/27/22.   Well controlled  4.  Left kidney renal mass, seen on MRI abdomen with/without contrast measuring 1.7 to 1.4 cm, consistent with small renal cell carcinoma.  Will follow-up with urology outpatient.  5. Anemia of chronic kidney disease  Lab Results  Component Value Date   HGB 10.6 (L) 10/27/2023    Hgb at goal. No need for ESA at this time   LOS: 9 Antawan Mchugh 10/31/202411:33 AM

## 2023-10-28 NOTE — Plan of Care (Signed)
  Problem: Education: Goal: Ability to describe self-care measures that may prevent or decrease complications (Diabetes Survival Skills Education) will improve Outcome: Progressing Goal: Individualized Educational Video(s) Outcome: Progressing   Problem: Coping: Goal: Ability to adjust to condition or change in health will improve Outcome: Progressing   

## 2023-10-28 NOTE — Telephone Encounter (Signed)
Pts husband returned call and would like a call back

## 2023-10-28 NOTE — Telephone Encounter (Signed)
Called pt's husband no answer. 2nd attempt.

## 2023-10-28 NOTE — Progress Notes (Signed)
Ailene Ravel, RN at [phone], at [facility], and gave report on patient. Patient will be going to Bed 118.  EMS here to transport patient to [facility]. I have removed IV with no complication and taken patient off of telemetry. I have reviewed discharge papers and instructions with patient as well as sent AVS with EMS to provide to facility.  Called patient's husband, Dr. Montez Morita and informed him that patient was leaving via EMS and he stated he would meet patient at [facility].

## 2023-10-29 ENCOUNTER — Non-Acute Institutional Stay (SKILLED_NURSING_FACILITY): Payer: Self-pay | Admitting: Student

## 2023-10-29 ENCOUNTER — Encounter: Payer: Self-pay | Admitting: Student

## 2023-10-29 DIAGNOSIS — N179 Acute kidney failure, unspecified: Secondary | ICD-10-CM | POA: Diagnosis not present

## 2023-10-29 DIAGNOSIS — G8929 Other chronic pain: Secondary | ICD-10-CM | POA: Diagnosis not present

## 2023-10-29 DIAGNOSIS — I482 Chronic atrial fibrillation, unspecified: Secondary | ICD-10-CM

## 2023-10-29 DIAGNOSIS — M25562 Pain in left knee: Secondary | ICD-10-CM | POA: Diagnosis not present

## 2023-10-29 DIAGNOSIS — C642 Malignant neoplasm of left kidney, except renal pelvis: Secondary | ICD-10-CM | POA: Diagnosis not present

## 2023-10-29 DIAGNOSIS — Z992 Dependence on renal dialysis: Secondary | ICD-10-CM

## 2023-10-29 DIAGNOSIS — M4800 Spinal stenosis, site unspecified: Secondary | ICD-10-CM | POA: Insufficient documentation

## 2023-10-29 DIAGNOSIS — R339 Retention of urine, unspecified: Secondary | ICD-10-CM | POA: Diagnosis not present

## 2023-10-29 DIAGNOSIS — I509 Heart failure, unspecified: Secondary | ICD-10-CM | POA: Diagnosis not present

## 2023-10-29 DIAGNOSIS — L03119 Cellulitis of unspecified part of limb: Secondary | ICD-10-CM | POA: Diagnosis not present

## 2023-10-29 DIAGNOSIS — I10 Essential (primary) hypertension: Secondary | ICD-10-CM | POA: Insufficient documentation

## 2023-10-29 DIAGNOSIS — N186 End stage renal disease: Secondary | ICD-10-CM | POA: Diagnosis not present

## 2023-10-29 NOTE — Progress Notes (Signed)
Provider:  Dr. Earnestine Mealing Location:  Other [place].  Nursing Home Room Number: [facility] [address] Place of Service:  SNF ([phone])   PCP: Lynnea Ferrier, MD Patient Care Team: Lynnea Ferrier, MD as PCP - General (Internal Medicine) Kemper Durie, RN as [facility] Care Management  Extended Emergency Contact Information Primary Emergency Contact: Tiner,DR. [name] Address: [address]          [place], [place] [address] D[place] of [place] Home Phone: [phone] Mobile Phone: [phone] Relation: Spouse  Code Status: Full Code Goals of Care: Advanced Directive information    10/29/2023    9:17 AM  Advanced Directives  Does Patient Have a Medical Advance Directive? No  Does patient want to make changes to medical advance directive? No - Patient declined      Chief Complaint  Patient presents with   New Admit To SNF    Admission.     HPI: Patient is a 89 y.o. female seen today for admission to [facility].  Patient and spouse are present providing history.  Patient has had history of chronic kidney disease and has been using medications for symptomatic management with regular usage of torsemide for 1 to 2 years.  Patient presented to the emergency department with anasarca found to have CHF exacerbation and end-stage renal disease.  Patient continues to make urine.  She was presented the idea to start dialysis versus comfort measures and decided to start dialysis.  She had dialysis today and it took all day, which has her second-guessing her decision to proceed with the procedure.  She states she will commit to 1 week trial of dialysis and if she does not maintain adequate quality of life would like to transition to comfort approach to care.  She is eating well, having good bowel movements.  She continues to produce urine and has a Foley catheter in place.  Hoping to have Foley removed in the near future if possible.  Patient has lived on campus for the  last 6 months.  She is a retired Child psychotherapist.  Her spouse is a retired Development worker, community.  She is a mother of 4 children with 9 grandchildren and 2 great-grandchildren.  She states she made the decision with them in mind.  She has had a fall while in moderate loss but this time of [place] for her dedication to improvement at home she requires assistance for cooking, cleaning, showering, dressing with aid from her spouse due to chronic pain.  She ambulates with a walker.  She receives assistance with medication management at this time.  Patient states if she is to become ill she would like to return home and die peacefully..  She understands the severity of her current and Again to a week trial of dialysis.  Patient maintains DNR status. Past Medical History:  Diagnosis Date   Anemia    Aortic atherosclerosis (HCC)    Arthritis    CAD (coronary artery disease) 02/27/2015   a.) MPI 02/27/2015: EF 62%, small/mild area of septal hypoperfusion with borderline partial redistribution; equivocal study; b.) LHC 03/13/2015: LVEDP 16 mmHg; 10% mLAD, 10% RCA --> no intervention required (med mgmt); c.) MPI 03/16/2019: EF 56%; normal study.   Cataracts, both eyes    Cervical disc disease    a.) s/p cervical fusion   Chickenpox    CKD (chronic kidney disease), stage IV (HCC)    Complication of anesthesia    a.) remote h/o PONV; b.)  difficult intubation related to remote cervical fusion   Difficult intubation    a.) secondary to remote cervical fusion; reports severe pain s/p being intubated   Dyspnea on exertion    HFrEF (heart failure with reduced ejection fraction) (HCC)    a.) TTE 01/11/2014: EF >55%, mod LVH, MAC, mild RAE, mild MR/TR, sev PR; b.) TTE 11/14/2018: EF 40%, sep and inf HK, mild LAE, triv P, mod MR/TR, G2DD; c.) TTE 11/15/2020: EF 45%, sep HK, mild LVH, triv AR/PR, mild MR/TR, d.) TTE 10/28/2021: EF 45%, mild glob HK, mild LVH, triv AR/PR, mild MR, mod TR; e.) TTE 11/06/2021: EF 30-35%, mid apical  and anteroseptal HK, mild TR, mod MR, G2DD.   History of recurrent UTI (urinary tract infection)    Hyperlipidemia    Hypertension    Incontinence in female    wears pads   LBBB (left bundle branch block)    Long term (current) use of anticoagulants    a.) rivaroxaban   Long-term current use of opiate analgesic    a.) hydrocodone/APAP (Norco)   Lumbar disc disease    a.) s/p lumbar interbody fusion L2-L5   PAF (paroxysmal atrial fibrillation) (HCC)    a.) CHA2DS2VASc = 7 (age x2, sex, HFrEF, HTN, vascular diasease, T2DM); b.) s/p DCCV 01/12/2017 (150 J x 1) and 11/04/2021 (120 J x 1); c.) rate/rhythm maintained on oral amiodarone + metoprolol succinate; chronically anticoagulated with rivaroxaban   PONV (postoperative nausea and vomiting)    with 1st pregnancy 50 years ago and no problem since then   Right leg weakness    Sciatica    Spinal stenosis of lumbar region    Type 2 diabetes mellitus treated with insulin (HCC)    Ventral hernia    Past Surgical History:  Procedure Laterality Date   CARDIOVERSION N/A 11/04/2021   Procedure: CARDIOVERSION;  Surgeon: Dalia Heading, MD;  Location: [facility] ORS;  Service: Cardiovascular;  Laterality: N/A;   CARDIOVERSION N/A 03/17/2023   Procedure: CARDIOVERSION;  Surgeon: Marcina Millard, MD;  Location: [facility] ORS;  Service: Cardiovascular;  Laterality: N/A;   CATARACT EXTRACTION, BILATERAL     CERVICAL LAMINECTOMY  1996   CYSTOSCOPY W/ RETROGRADES  12/06/2020   Procedure: CYSTOSCOPY WITH RETROGRADE PYELOGRAM;  Surgeon: Sondra Come, MD;  Location: [facility] ORS;  Service: Urology;;   CYSTOSCOPY W/ URETERAL STENT PLACEMENT Left 04/10/2022   Procedure: CYSTOSCOPY WITH STENT REPLACEMENT;  Surgeon: Sondra Come, MD;  Location: [facility] ORS;  Service: Urology;  Laterality: Left;   CYSTOSCOPY W/ URETERAL STENT PLACEMENT Left 03/12/2023   Procedure: CYSTOSCOPY WITH STENT EXCHANGE;  Surgeon: Sondra Come, MD;  Location: [facility] OR[facility];  Service:  Urology;  Laterality: Left;   CYSTOSCOPY WITH STENT PLACEMENT Left 12/06/2020   Procedure: CYSTOSCOPY WITH STENT PLACEMENT;  Surgeon: Sondra Come, MD;  Location: [facility] OR[facility];  Service: Urology;  Laterality: Left;   DIALYSIS/PERMA CATHETER INSERTION Right 10/22/2023   Procedure: DIALYSIS/PERMA CATHETER INSERTION;  Surgeon: Renford Dills, MD;  Location: [facility] INVASIVE [facility];  Service: Cardiovascular;  Laterality: Right;   ELECTROPHYSIOLOGIC STUDY N/A 01/12/2017   Procedure: Cardioversion;  Surgeon: Dalia Heading, MD;  Location: [facility];  Service: Cardiovascular;  Laterality: N/A;   INSERTION OF MESH N/A 08/27/2022   Procedure: INSERTION OF MESH;  Surgeon: Sung Amabile, DO;  Location: [facility] OR[facility];  Service: General;  Laterality: N/A;   JOINT REPLACEMENT     LATERAL FUSION LUMBAR SPINE, TRANSVERSE  01/2010   L2-L5  LEFT HEART CATH AND CORONARY ANGIOGRAPHY Left 03/13/2015   Procedure: LEFT HEART CATH AND CORONARY ANGIOGRAPHY; Location: [facility]; Surgeon: Harold Hedge, MD   ROTATOR CUFF REPAIR Right 2010   TONSILLECTOMY     TOTAL KNEE ARTHROPLASTY Left 05/23/2019   Procedure: TOTAL KNEE ARTHROPLASTY - LEFT - DIABETIC;  Surgeon: Christena Flake, MD;  Location: [facility] OR[facility];  Service: Orthopedics;  Laterality: Left;   URETEROSCOPY N/A 12/06/2020   Procedure: DIAGNOSTIC URETEROSCOPY;  Surgeon: Sondra Come, MD;  Location: [facility];  Service: Urology;  Laterality: N/A;   XI ROBOTIC ASSISTED VENTRAL HERNIA N/A 08/27/2022   Procedure: XI ROBOTIC ASSISTED VENTRAL HERNIA;  Surgeon: Sung Amabile, DO;  Location: [facility];  Service: General;  Laterality: N/A;    reports that she has never smoked. She has never been exposed to tobacco smoke. She has never used smokeless tobacco. She reports current alcohol use of about 1.0 standard drink of alcohol per week. She reports that she does not use drugs. Social History   Socioeconomic History   Marital status: Married    Spouse name: Molly Maduro   Number of children:  Not on file   Years of education: Not on file   Highest education level: Not on file  Occupational History   Not on file  Tobacco Use   Smoking status: Never    Passive exposure: Never   Smokeless tobacco: Never  Vaping Use   Vaping status: Never Used  Substance and Sexual Activity   Alcohol use: Yes    Alcohol/week: 1.0 standard drink of alcohol    Types: 1 Glasses of wine per week    Comment: RARE   Drug use: Never   Sexual activity: Not Currently    Birth control/protection: Post-menopausal  Other Topics Concern   Not on file  Social History Narrative   Not on file   Social Determinants of Health   Financial Resource Strain: Low Risk  (09/29/2023)   Received from [facility]   Overall Financial Resource Strain (CARDIA)    Difficulty of Paying Living Expenses: Not hard at all  Food Insecurity: No Food Insecurity (10/19/2023)   Hunger Vital Sign    Worried About Running Out of Food in the Last Year: Never true    Ran Out of Food in the Last Year: Never true  Transportation Needs: No Transportation Needs (10/19/2023)   PRAPARE - Administrator, Civil Service (Medical): No    Lack of Transportation (Non-Medical): No  Physical Activity: Not on file  Stress: Not on file  Social Connections: Not on file  Intimate Partner Violence: Not At Risk (10/19/2023)   Humiliation, Afraid, Rape, and Kick questionnaire    Fear of Current or Ex-Partner: No    Emotionally Abused: No    Physically Abused: No    Sexually Abused: No    Functional Status Survey:    Family History  Adopted: Yes    Health Maintenance  Topic Date Due   FOOT EXAM  Never done   OPHTHALMOLOGY EXAM  Never done   DEXA SCAN  Never done   Zoster Vaccines- Shingrix (2 of 2) 12/03/2017   INFLUENZA VACCINE  07/29/2023   Medicare Annual Wellness (AWV)  08/13/2023   COVID-19 Vaccine (5 - 2023-24 season) 08/29/2023   HEMOGLOBIN A1C  04/18/2024   DTaP/Tdap/Td (2 - Tdap)  06/03/2026   Pneumonia Vaccine 38+ Years old  Completed   HPV VACCINES  Aged Out    Allergies  Allergen  Reactions   Amlodipine Swelling   Codeine Nausea And Vomiting    Tolerates low doses hydrocodone   Sacubitril-Valsartan Other (See Comments)    Acute kidney injury   Tizanidine     Didn't feel good taking it   Doxycycline Nausea And Vomiting    Outpatient Encounter Medications as of 10/29/2023  Medication Sig   acetaminophen (TYLENOL) 500 MG tablet Take 1 tablet (500 mg total) by mouth every 8 (eight) hours as needed for mild pain (pain score 1-3).   allopurinol (ZYLOPRIM) 100 MG tablet Take 0.5 tablets (50 mg total) by mouth daily.   apixaban (ELIQUIS) 2.5 MG TABS tablet Take 1 tablet (2.5 mg total) by mouth 2 (two) times daily.   glucose blood (PRECISION QID TEST) test strip Use 2 (two) times daily Use as instructed.   HYDROcodone-acetaminophen (NORCO/VICODIN) 5-325 MG tablet Take 1 tablet by mouth every 6 (six) hours as needed for moderate pain (pain score 4-6).   insulin aspart (NOVOLOG) 100 UNIT/ML injection Inject 0-20 Units into the skin 3 (three) times daily with meals. CBG < 70: administer glucose. CBG 70 - 120: 0 units; CBG 121 - 150: 4 units; CBG 151 - 200: 5 units; CBG 201 - 250: 7 units; CBG 251 - 300: 11 units; CBG 301 - 350: 15 units; CBG 351 - 400: 20 units; CBG > 400: call MD   insulin glargine-yfgn (SEMGLEE) 100 UNIT/ML injection Inject 0.08 mLs (8 Units total) into the skin daily.   Insulin Pen Needle (BD PEN NEEDLE NANO U/F) 32G X 4 MM MISC USE WITH INSULIN PEN TWICE DAILY OR AS DIRECTED   linagliptin (TRADJENTA) 5 MG TABS tablet Take 1 tablet (5 mg total) by mouth daily.   metoprolol succinate (TOPROL-XL) 25 MG 24 hr tablet Take 0.5 tablets (12.5 mg total) by mouth 2 (two) times daily.   multivitamin (RENA-VIT) TABS tablet Take 1 tablet by mouth at bedtime.   Nutritional Supplements (,FEEDING SUPPLEMENT, PROSOURCE PLUS) liquid Take 30 mLs by mouth 3 (three) times  daily between meals.   sulfamethoxazole-trimethoprim (BACTRIM) 400-80 MG tablet Take 1 tablet by mouth 2 (two) times daily for 2 days.   torsemide (DEMADEX) 20 MG tablet Take 1 tablet (20 mg total) by mouth every Tuesday, Thursday, and Saturday at 6 PM.   amiodarone (PACERONE) 200 MG tablet Take 200 mg by mouth daily.   insulin aspart (NOVOLOG) 100 UNIT/ML injection Inject 0-5 Units into the skin at bedtime. (Patient not taking: Reported on 10/29/2023)   [DISCONTINUED] polyethylene glycol (MIRALAX / GLYCOLAX) 17 g packet Take 8.5-17 g by mouth daily as needed for moderate constipation.   No facility-administered encounter medications on file as of 10/29/2023.    Review of Systems  Vitals:   10/29/23 0908 10/29/23 0918  BP: (!) 147/70 (!) 112/55  Pulse: 69   Resp: 16   Temp: (!) 97.3 F (36.3 C)   SpO2: 96%   Weight: 191 lb (86.6 kg)   Height: 5\' 3"  (1.6 m)    Body mass index is 33.83 kg/m. Physical Exam Cardiovascular:     Rate and Rhythm: Normal rate and regular rhythm.     Pulses: Normal pulses.     Heart sounds: Normal heart sounds.  Pulmonary:     Effort: Pulmonary effort is normal.     Breath sounds: Normal breath sounds.  Abdominal:     General: Abdomen is flat.     Palpations: Abdomen is soft.  Musculoskeletal:     Comments: 3+  swelling bilateral lower extremities to the knee.  Neurological:     Mental Status: She is alert and oriented to person, place, and time.     Labs reviewed: Basic Metabolic Panel: Recent Labs    10/25/23 0338 10/26/23 0521 10/27/23 0615  NA 133* 131* 131*  K 4.6 4.0 3.8  CL 97* 92* 95*  CO2 25 26 23   GLUCOSE 217* 314* 207*  BUN 48* 42* 68*  CREATININE 2.61* 2.38* 3.02*  CALCIUM 8.3* 8.2* 8.3*   Liver Function Tests: No results for input(s): "AST", "ALT", "ALKPHOS", "BILITOT", "PROT", "ALBUMIN" in the last 8760 hours. No results for input(s): "LIPASE", "AMYLASE" in the last 8760 hours. No results for input(s): "AMMONIA" in the  last 8760 hours. CBC: Recent Labs    10/25/23 0338 10/26/23 0521 10/27/23 0615  WBC 8.0 9.6 9.2  NEUTROABS 6.7 7.6 7.1  HGB 10.5* 9.9* 10.6*  HCT 32.8* 31.0* 34.1*  MCV 83.7 84.0 86.8  PLT 201 247 249   Cardiac Enzymes: Recent Labs    10/19/23 0827  CKTOTAL 16*   BNP: Invalid input(s): "POCBNP" Lab Results  Component Value Date   HGBA1C 8.9 (H) 10/19/2023   Lab Results  Component Value Date   TSH 0.886 04/19/2012   No results found for: "VITAMINB12" No results found for: "FOLATE" No results found for: "IRON", "TIBC", "FERRITIN"  Imaging and Procedures obtained prior to SNF admission: ECHOCARDIOGRAM COMPLETE  Result Date: 10/24/2023    ECHOCARDIOGRAM REPORT   Patient Name:   Tiffany [name] Date of Exam: 10/24/2023 Medical Rec #:  [id number]        Height:       63.0 in Accession #:    [id number]       Weight:       188.7 lb Date of Birth:  06-Nov-1933        BSA:          1.886 m Patient Age:    89 years         BP:           132/65 mmHg Patient Gender: F                HR:           72 bpm. Exam Location:  [facility] Procedure: 2D Echo Indications:     CHF- Acute Systolic I50.21  History:         Patient has prior history of Echocardiogram examinations.  Sonographer:     Elwin Sleight RDCS Referring Phys:  [id number] Loyce Dys Diagnosing Phys: Julien Nordmann MD  Sonographer Comments: Suboptimal parasternal window, suboptimal apical window and suboptimal subcostal window. Image acquisition challenging due to patient body habitus. IMPRESSIONS  1. Left ventricular ejection fraction, by estimation, is 40 to 45%. Left ventricular ejection fraction by PLAX is 42 %. The left ventricle has mildly decreased function. The left ventricle demonstrates global hypokinesis. Left ventricular diastolic parameters are consistent with Grade I diastolic dysfunction (impaired relaxation).  2. Right ventricular systolic function is normal. The right ventricular size is normal. There is moderately elevated  pulmonary artery systolic pressure. The estimated right ventricular systolic pressure is 54.6 mmHg.  3. The mitral valve is normal in structure. Mild mitral valve regurgitation. No evidence of mitral stenosis.  4. The aortic valve has an indeterminant number of cusps. Aortic valve regurgitation is not visualized. Aortic valve sclerosis is present, with no evidence of aortic valve stenosis.  5. The inferior vena cava is  normal in size with greater than 50% respiratory variability, suggesting right atrial pressure of 3 mmHg. FINDINGS  Left Ventricle: Left ventricular ejection fraction, by estimation, is 40 to 45%. Left ventricular ejection fraction by PLAX is 42 %. The left ventricle has mildly decreased function. The left ventricle demonstrates global hypokinesis. The left ventricular internal cavity size was normal in size. There is no left ventricular hypertrophy. Left ventricular diastolic parameters are consistent with Grade I diastolic dysfunction (impaired relaxation). Right Ventricle: The right ventricular size is normal. No increase in right ventricular wall thickness. Right ventricular systolic function is normal. There is moderately elevated pulmonary artery systolic pressure. The tricuspid regurgitant velocity is 3.52 m/s, and with an assumed right atrial pressure of 5 mmHg, the estimated right ventricular systolic pressure is 54.6 mmHg. Left Atrium: Left atrial size was normal in size. Right Atrium: Right atrial size was normal in size. Pericardium: There is no evidence of pericardial effusion. Mitral Valve: The mitral valve is normal in structure. Mild mitral valve regurgitation. No evidence of mitral valve stenosis. Tricuspid Valve: The tricuspid valve is normal in structure. Tricuspid valve regurgitation is mild . No evidence of tricuspid stenosis. Aortic Valve: The aortic valve has an indeterminant number of cusps. Aortic valve regurgitation is not visualized. Aortic valve sclerosis is present, with  no evidence of aortic valve stenosis. Aortic valve peak gradient measures 8.4 mmHg. Pulmonic Valve: The pulmonic valve was normal in structure. Pulmonic valve regurgitation is not visualized. No evidence of pulmonic stenosis. Aorta: The aortic root is normal in size and structure. Venous: The inferior vena cava is normal in size with greater than 50% respiratory variability, suggesting right atrial pressure of 3 mmHg. IAS/Shunts: No atrial level shunt detected by color flow Doppler.  LEFT VENTRICLE PLAX 2D LV EF:         Left            Diastology                ventricular     LV e' medial:    4.24 cm/s                ejection        LV E/e' medial:  20.1                fraction by     LV e' lateral:   7.83 cm/s                PLAX is 42      LV E/e' lateral: 10.9                %. LVIDd:         3.75 cm LVIDs:         3.00 cm LV PW:         1.25 cm LV IVS:        1.25 cm LVOT diam:     1.90 cm LV SV:         52 LV SV Index:   28 LVOT Area:     2.84 cm  LV Volumes (MOD) LV vol d, MOD    79.9 ml A4C: LV SV MOD A4C:   79.9 ml RIGHT VENTRICLE RV Basal diam:  3.30 cm RV S prime:     17.00 cm/s TAPSE (M-mode): 2.2 cm LEFT ATRIUM             Index        RIGHT ATRIUM  Index LA diam:        3.40 cm 1.80 cm/m   RA Area:     12.30 cm LA Vol (A2C):   48.4 ml 25.66 ml/m  RA Volume:   26.20 ml  13.89 ml/m LA Vol (A4C):   48.4 ml 25.66 ml/m LA Biplane Vol: 48.7 ml 25.82 ml/m  AORTIC VALVE                 PULMONIC VALVE AV Area (Vmax): 1.98 cm     PV Vmax:        1.44 m/s AV Vmax:        144.50 cm/s  PV Peak grad:   8.2 mmHg AV Peak Grad:   8.4 mmHg     RVOT Peak grad: 4 mmHg LVOT Vmax:      101.00 cm/s LVOT Vmean:     59.100 cm/s LVOT VTI:       0.185 m  AORTA Ao Root diam: 2.50 cm Ao Asc diam:  3.60 cm MITRAL VALVE               TRICUSPID VALVE MV Area (PHT): 3.89 cm    TR Peak grad:   49.6 mmHg MV Decel Time: 195 msec    TR Vmax:        352.00 cm/s MV E velocity: 85.40 cm/s MV A velocity: 93.10 cm/s  SHUNTS  MV E/A ratio:  0.92        Systemic VTI:  0.18 m                            Systemic Diam: 1.90 cm Julien Nordmann MD Electronically signed by Julien Nordmann MD Signature Date/Time: 10/24/2023/9:32:01 AM    Final     Assessment/Plan Acute on chronic congestive heart failure, unspecified heart failure type (HCC)  AKI (acute kidney injury) (HCC)  Atrial fibrillation, chronic (HCC)  ESRD (end stage renal disease) on dialysis (HCC) - Plan: Do not attempt resuscitation (DNR)  Chronic pain of left knee  Cellulitis of lower extremity, unspecified laterality  Renal cell carcinoma of left kidney (HCC)  Urinary retention Patient admitted and found to have CHF exacerbation.  Echocardiogram notable for ejection fraction 4050% with grade 1 diastolic dysfunction.  Patient started on goal-directed management with torsemide.  She is currently on dialysis Monday Wednesday Friday due to anasarca.  Based on goals of care conversations will continue discussion regarding dialysis.  Follow-up with cardiology outpatient.  History of renal cell carcinoma with obstruction followed with nephrology.  Urinary retention after failed trial of void, will plan for trial of void after 3 additional days of bladder rest.  Of note patient has vaginal prolapse which could contribute to poor success of Foley catheter removal.  Recurrent cellulitis with bacteremia and will continue Bactrim until 11-2.  Of note high risk for hyperkalemia with ESRD, follow-up BMP.  Severe pain secondary to osteoarthritis with chronic opioid medication for pain management.  Will plan to have Tylenol every 6 as needed and Norco every 6.  For pain.  History of proximal dismal atrial fibrillation with anticoagulation transition to Eliquis history of diabetes dependent with insulin.  Increase glargine to 10 units nightly and continue sliding scale insulin for mealtime dosing.  Continue ACHS glucose checks.  Patient with new start of allopurinol due to history  of gout in the setting of dialysis.  Continue to monitor   Family/ staff Communication: Nursing, spouse  Labs/tests ordered:  CBC, BMP  I spent greater than 60  minutes for the care of this patient in face to face time, chart review, clinical documentation, patient education. I spent an additional 16 minutes discussing goals of care and advanced care planning.

## 2023-10-29 NOTE — Telephone Encounter (Signed)
Called pt's husband, no answer. LM for pt or husband to call back. 3rd attempt.

## 2023-11-01 NOTE — Telephone Encounter (Signed)
Spoke with Dr Montez Morita, patient's husband, advised of message below, updated the primary phone number to call is his cell phone number not home number.   He would like to know if patient should keep her February 2025 appointment for a routine follow up or cancel it at this time since that appointment was for discussion of stent change and this is not recommended for patient now?

## 2023-11-01 NOTE — Telephone Encounter (Signed)
Called pt's husband and advised them to keep f/u as scheduled. Husband voiced understanding.

## 2023-11-04 ENCOUNTER — Telehealth: Payer: PPO | Admitting: Orthopedic Surgery

## 2023-11-04 ENCOUNTER — Encounter: Payer: Self-pay | Admitting: Nurse Practitioner

## 2023-11-04 ENCOUNTER — Non-Acute Institutional Stay (SKILLED_NURSING_FACILITY): Payer: PPO | Admitting: Nurse Practitioner

## 2023-11-04 DIAGNOSIS — C642 Malignant neoplasm of left kidney, except renal pelvis: Secondary | ICD-10-CM

## 2023-11-04 DIAGNOSIS — L03119 Cellulitis of unspecified part of limb: Secondary | ICD-10-CM

## 2023-11-04 DIAGNOSIS — I482 Chronic atrial fibrillation, unspecified: Secondary | ICD-10-CM | POA: Diagnosis not present

## 2023-11-04 DIAGNOSIS — N186 End stage renal disease: Secondary | ICD-10-CM | POA: Diagnosis not present

## 2023-11-04 DIAGNOSIS — M1A9XX0 Chronic gout, unspecified, without tophus (tophi): Secondary | ICD-10-CM | POA: Diagnosis not present

## 2023-11-04 DIAGNOSIS — Z992 Dependence on renal dialysis: Secondary | ICD-10-CM

## 2023-11-04 DIAGNOSIS — M25562 Pain in left knee: Secondary | ICD-10-CM | POA: Diagnosis not present

## 2023-11-04 DIAGNOSIS — G8929 Other chronic pain: Secondary | ICD-10-CM

## 2023-11-04 DIAGNOSIS — Z794 Long term (current) use of insulin: Secondary | ICD-10-CM

## 2023-11-04 DIAGNOSIS — N184 Chronic kidney disease, stage 4 (severe): Secondary | ICD-10-CM

## 2023-11-04 DIAGNOSIS — I509 Heart failure, unspecified: Secondary | ICD-10-CM

## 2023-11-04 DIAGNOSIS — E1122 Type 2 diabetes mellitus with diabetic chronic kidney disease: Secondary | ICD-10-CM | POA: Diagnosis not present

## 2023-11-04 DIAGNOSIS — R339 Retention of urine, unspecified: Secondary | ICD-10-CM

## 2023-11-04 MED ORDER — APIXABAN 2.5 MG PO TABS: 2.5000 mg | ORAL_TABLET | Freq: Two times a day (BID) | ORAL | 0 refills | Status: DC

## 2023-11-04 MED ORDER — ALLOPURINOL 100 MG PO TABS: 50.0000 mg | ORAL_TABLET | Freq: Every day | ORAL | 0 refills | Status: DC

## 2023-11-04 NOTE — Progress Notes (Signed)
Location:  Other [place].  Nursing Home Room Number: [facility] [address] Place of Service:  SNF [phone]) Abbey Chatters, NP  PCP: Lynnea Ferrier, MD  Patient Care Team: Lynnea Ferrier, MD as PCP - General (Internal Medicine) Kemper Durie, RN as [facility] Care Management  Extended Emergency Contact Information Primary Emergency Contact: [place],DR. [name] Address: [address]          [place], [place] [address] [place] of [place] Home Phone: [phone] Mobile Phone: [phone] Relation: Spouse  Goals of care: Advanced Directive information    11/04/2023   12:36 PM  Advanced Directives  Does Patient Have a Medical Advance Directive? Yes  Type of Advance Directive Out of facility DNR (pink MOST or yellow form)  Does patient want to make changes to medical advance directive? No - Patient declined     Chief Complaint  Patient presents with   Discharge Note    Discharge    HPI:  Pt is a 89 y.o. female seen today for Discharge home.  Pt was admitted to SNF after hospitalization for right lef cellulitis with bacteremia and ESRD now on HD.   Continues to have swelling to LE, has TED hose and wrapping bilateral LE, she reports husband has been doing this for her for awhile. Had blisters to right lower legs that were weeping but now only with scant drainage.  Severe right knee pain- got a shot from orthopedic while in the hospital and pain has been better controlled.   Afib- xarelto was changed to eliquis during hospitalization. Will need Rx for this going home  DM-managed with insulin that she has been doing at home- has insulin already at home.   Uric acid elevated and started on allopurinol during hospitalization, no noted flare.   Past Medical History:  Diagnosis Date   Anemia    Aortic atherosclerosis (HCC)    Arthritis    CAD (coronary artery disease) 02/27/2015   a.) MPI 02/27/2015: EF 62%, small/mild area of septal hypoperfusion with  borderline partial redistribution; equivocal study; b.) LHC 03/13/2015: LVEDP 16 mmHg; 10% mLAD, 10% RCA --> no intervention required (med mgmt); c.) MPI 03/16/2019: EF 56%; normal study.   Cataracts, both eyes    Cervical disc disease    a.) s/p cervical fusion   Chickenpox    CKD (chronic kidney disease), stage IV (HCC)    Complication of anesthesia    a.) remote h/o PONV; b.) difficult intubation related to remote cervical fusion   Difficult intubation    a.) secondary to remote cervical fusion; reports severe pain s/p being intubated   Dyspnea on exertion    HFrEF (heart failure with reduced ejection fraction) (HCC)    a.) TTE 01/11/2014: EF >55%, mod LVH, MAC, mild RAE, mild MR/TR, sev PR; b.) TTE 11/14/2018: EF 40%, sep and inf HK, mild LAE, triv P, mod MR/TR, G2DD; c.) TTE 11/15/2020: EF 45%, sep HK, mild LVH, triv AR/PR, mild MR/TR, d.) TTE 10/28/2021: EF 45%, mild glob HK, mild LVH, triv AR/PR, mild MR, mod TR; e.) TTE 11/06/2021: EF 30-35%, mid apical and anteroseptal HK, mild TR, mod MR, G2DD.   History of recurrent UTI (urinary tract infection)    Hyperlipidemia    Hypertension    Incontinence in female    wears pads   LBBB (left bundle branch block)    Long term (current) use of anticoagulants    a.) rivaroxaban   Long-term current use of opiate  analgesic    a.) hydrocodone/APAP (Norco)   Lumbar disc disease    a.) s/p lumbar interbody fusion L2-L5   PAF (paroxysmal atrial fibrillation) (HCC)    a.) CHA2DS2VASc = 7 (age x2, sex, HFrEF, HTN, vascular diasease, T2DM); b.) s/p DCCV 01/12/2017 (150 J x 1) and 11/04/2021 (120 J x 1); c.) rate/rhythm maintained on oral amiodarone + metoprolol succinate; chronically anticoagulated with rivaroxaban   PONV (postoperative nausea and vomiting)    with 1st pregnancy 50 years ago and no problem since then   Right leg weakness    Sciatica    Spinal stenosis of lumbar region    Type 2 diabetes mellitus treated with insulin (HCC)     Ventral hernia    Past Surgical History:  Procedure Laterality Date   CARDIOVERSION N/A 11/04/2021   Procedure: CARDIOVERSION;  Surgeon: Dalia Heading, MD;  Location: [facility] ORS;  Service: Cardiovascular;  Laterality: N/A;   CARDIOVERSION N/A 03/17/2023   Procedure: CARDIOVERSION;  Surgeon: Marcina Millard, MD;  Location: [facility] OR[facility];  Service: Cardiovascular;  Laterality: N/A;   CATARACT EXTRACTION, BILATERAL     CERVICAL LAMINECTOMY  1996   CYSTOSCOPY W/ RETROGRADES  12/06/2020   Procedure: CYSTOSCOPY WITH RETROGRADE PYELOGRAM;  Surgeon: Sondra Come, MD;  Location: [facility];  Service: Urology;;   CYSTOSCOPY W/ URETERAL STENT PLACEMENT Left 04/10/2022   Procedure: CYSTOSCOPY WITH STENT REPLACEMENT;  Surgeon: Sondra Come, MD;  Location: [facility];  Service: Urology;  Laterality: Left;   CYSTOSCOPY W/ URETERAL STENT PLACEMENT Left 03/12/2023   Procedure: CYSTOSCOPY WITH STENT EXCHANGE;  Surgeon: Sondra Come, MD;  Location: [facility];  Service: Urology;  Laterality: Left;   CYSTOSCOPY WITH STENT PLACEMENT Left 12/06/2020   Procedure: CYSTOSCOPY WITH STENT PLACEMENT;  Surgeon: Sondra Come, MD;  Location: [facility];  Service: Urology;  Laterality: Left;   DIALYSIS/PERMA CATHETER INSERTION Right 10/22/2023   Procedure: DIALYSIS/PERMA CATHETER INSERTION;  Surgeon: Renford Dills, MD;  Location: [facility];  Service: Cardiovascular;  Laterality: Right;   ELECTROPHYSIOLOGIC STUDY N/A 01/12/2017   Procedure: Cardioversion;  Surgeon: Dalia Heading, MD;  Location: [facility];  Service: Cardiovascular;  Laterality: N/A;   INSERTION OF MESH N/A 08/27/2022   Procedure: INSERTION OF MESH;  Surgeon: Sung Amabile, DO;  Location: [facility];  Service: General;  Laterality: N/A;   JOINT REPLACEMENT     LATERAL FUSION LUMBAR SPINE, TRANSVERSE  01/2010   L2-L5   LEFT HEART CATH AND CORONARY ANGIOGRAPHY Left 03/13/2015   Procedure: LEFT HEART CATH AND CORONARY ANGIOGRAPHY; Location:  [facility]; Surgeon: Harold Hedge, MD   ROTATOR CUFF REPAIR Right 2010   TONSILLECTOMY     TOTAL KNEE ARTHROPLASTY Left 05/23/2019   Procedure: TOTAL KNEE ARTHROPLASTY - LEFT - DIABETIC;  Surgeon: Christena Flake, MD;  Location: [facility];  Service: Orthopedics;  Laterality: Left;   URETEROSCOPY N/A 12/06/2020   Procedure: DIAGNOSTIC URETEROSCOPY;  Surgeon: Sondra Come, MD;  Location: [facility];  Service: Urology;  Laterality: N/A;   XI ROBOTIC ASSISTED VENTRAL HERNIA N/A 08/27/2022   Procedure: XI ROBOTIC ASSISTED VENTRAL HERNIA;  Surgeon: Sung Amabile, DO;  Location: [facility];  Service: General;  Laterality: N/A;    Allergies  Allergen Reactions   Amlodipine Swelling   Codeine Nausea And Vomiting    Tolerates low doses hydrocodone   Sacubitril-Valsartan Other (See Comments)    Acute kidney injury   Tizanidine     Didn't feel good taking it  Doxycycline Nausea And Vomiting    Outpatient Encounter Medications as of 11/04/2023  Medication Sig   acetaminophen (TYLENOL) 500 MG tablet Take 1 tablet (500 mg total) by mouth every 8 (eight) hours as needed for mild pain (pain score 1-3). (Patient taking differently: Take 500 mg by mouth every 6 (six) hours as needed for mild pain (pain score 1-3).)   allopurinol (ZYLOPRIM) 100 MG tablet Take 0.5 tablets (50 mg total) by mouth daily.   amiodarone (PACERONE) 200 MG tablet Take 200 mg by mouth daily.   apixaban (ELIQUIS) 2.5 MG TABS tablet Take 1 tablet (2.5 mg total) by mouth 2 (two) times daily.   glucose blood (PRECISION QID TEST) test strip Use 2 (two) times daily Use as instructed.   HYDROcodone-acetaminophen (NORCO/VICODIN) 5-325 MG tablet Take 1 tablet by mouth every 6 (six) hours as needed for moderate pain (pain score 4-6).   insulin aspart (NOVOLOG) 100 UNIT/ML injection Inject 0-20 Units into the skin 3 (three) times daily with meals. CBG < 70: administer glucose. CBG 70 - 120: 0 units; CBG 121 - 150: 4 units; CBG 151 - 200: 5 units; CBG  201 - 250: 7 units; CBG 251 - 300: 11 units; CBG 301 - 350: 15 units; CBG 351 - 400: 20 units; CBG > 400: call MD (Patient taking differently: Inject 0-20 Units into the skin 2 (two) times daily. CBG < 70: administer glucose. CBG 70 - 120: 0 units; CBG 121 - 150: 4 units; CBG 151 - 200: 5 units; CBG 201 - 250: 7 units; CBG 251 - 300: 11 units; CBG 301 - 350: 15 units; CBG 351 - 400: 20 units; CBG > 400: call MD)   insulin glargine-yfgn (SEMGLEE) 100 UNIT/ML injection Inject 0.08 mLs (8 Units total) into the skin daily. (Patient taking differently: Inject 10 Units into the skin daily.)   Insulin Pen Needle (BD PEN NEEDLE NANO U/F) 32G X 4 MM MISC USE WITH INSULIN PEN TWICE DAILY OR AS DIRECTED   linagliptin (TRADJENTA) 5 MG TABS tablet Take 1 tablet (5 mg total) by mouth daily.   metoprolol succinate (TOPROL-XL) 25 MG 24 hr tablet Take 0.5 tablets (12.5 mg total) by mouth 2 (two) times daily.   multivitamin (RENA-VIT) TABS tablet Take 1 tablet by mouth at bedtime.   Nutritional Supplements (,FEEDING SUPPLEMENT, PROSOURCE PLUS) liquid Take 30 mLs by mouth 3 (three) times daily between meals.   ondansetron (ZOFRAN) 4 MG tablet Take 4 mg by mouth every 8 (eight) hours as needed for nausea or vomiting.   torsemide (DEMADEX) 20 MG tablet Take 1 tablet (20 mg total) by mouth every Tuesday, Thursday, and Saturday at 6 PM.   insulin aspart (NOVOLOG) 100 UNIT/ML injection Inject 0-5 Units into the skin at bedtime. (Patient not taking: Reported on 10/29/2023)   No facility-administered encounter medications on file as of 11/04/2023.    Review of Systems  Constitutional:  Negative for activity change, appetite change, fatigue and unexpected weight change.  HENT:  Negative for congestion and hearing loss.   Eyes: Negative.   Respiratory:  Negative for cough and shortness of breath.   Cardiovascular:  Positive for leg swelling. Negative for chest pain and palpitations.  Gastrointestinal:  Negative for abdominal  pain, constipation and diarrhea.  Genitourinary:  Negative for difficulty urinating and dysuria.  Musculoskeletal:  Positive for arthralgias and myalgias.  Skin:  Negative for color change and wound.  Neurological:  Negative for dizziness and weakness.  Psychiatric/Behavioral:  Negative  for agitation, behavioral problems and confusion.      Immunization History  Administered Date(s) Administered   Influenza Inj Mdck Quad Pf 09/29/2016   Influenza, High Dose Seasonal PF 09/19/2019, 10/17/2020   Influenza-Unspecified 09/30/2011, 09/23/2017, 10/10/2018, 09/25/2022   PFIZER(Purple Top)SARS-COV-2 Vaccination 03/19/2020, 04/09/2020, 10/04/2020   Pneumococcal Conjugate-13 01/05/2017, 05/28/2017   Pneumococcal Polysaccharide-23 12/09/2012   Td 06/03/2016   Unspecified SARS-COV-2 Vaccination 09/25/2022   Zoster Recombinant(Shingrix) 10/08/2017   Zoster, Live 01/27/2011   Pertinent  Health Maintenance Due  Topic Date Due   FOOT EXAM  Never done   OPHTHALMOLOGY EXAM  Never done   DEXA SCAN  Never done   INFLUENZA VACCINE  07/29/2023   HEMOGLOBIN A1C  04/18/2024      11/06/2021    7:20 PM 11/07/2021    7:40 AM 08/27/2022    5:00 PM 08/28/2022   12:00 AM 08/28/2022    9:20 AM  Fall Risk  (RETIRED) Patient Fall Risk Level High fall risk High fall risk Moderate fall risk Moderate fall risk Moderate fall risk   Functional Status Survey:    Vitals:   11/04/23 1224  BP: 122/78  Pulse: 66  Resp: 18  Temp: (!) 97.3 F (36.3 C)  SpO2: 98%  Weight: 199 lb 6.4 oz (90.4 kg)  Height: 5\' 3"  (1.6 m)   Body mass index is 35.32 kg/m. Physical Exam Constitutional:      General: She is not in acute distress.    Appearance: She is well-developed. She is not diaphoretic.  HENT:     Head: Normocephalic and atraumatic.     Mouth/Throat:     Pharynx: No oropharyngeal exudate.  Eyes:     Conjunctiva/sclera: Conjunctivae normal.     Pupils: Pupils are equal, round, and reactive to light.   Cardiovascular:     Rate and Rhythm: Normal rate and regular rhythm.     Heart sounds: Normal heart sounds.  Pulmonary:     Effort: Pulmonary effort is normal.     Breath sounds: Normal breath sounds.  Abdominal:     General: Bowel sounds are normal.     Palpations: Abdomen is soft.  Musculoskeletal:     Cervical back: Normal range of motion and neck supple.     Right lower leg: Edema present.     Left lower leg: Edema present.  Skin:    General: Skin is warm and dry.     Comments: Venous changes to LE, right leg with mild redness, venous ulcers noted to right lower leg. Scant drainage.  Neurological:     Mental Status: She is alert.  Psychiatric:        Mood and Affect: Mood normal.     Labs reviewed: Recent Labs    10/25/23 0338 10/26/23 0521 10/27/23 0615  NA 133* 131* 131*  K 4.6 4.0 3.8  CL 97* 92* 95*  CO2 25 26 23   GLUCOSE 217* 314* 207*  BUN 48* 42* 68*  CREATININE 2.61* 2.38* 3.02*  CALCIUM 8.3* 8.2* 8.3*   No results for input(s): "AST", "ALT", "ALKPHOS", "BILITOT", "PROT", "ALBUMIN" in the last 8760 hours. Recent Labs    10/25/23 0338 10/26/23 0521 10/27/23 0615  WBC 8.0 9.6 9.2  NEUTROABS 6.7 7.6 7.1  HGB 10.5* 9.9* 10.6*  HCT 32.8* 31.0* 34.1*  MCV 83.7 84.0 86.8  PLT 201 247 249   Lab Results  Component Value Date   TSH 0.886 04/19/2012   Lab Results  Component Value Date  HGBA1C 8.9 (H) 10/19/2023   Lab Results  Component Value Date   CHOL 127 11/06/2021   HDL 45 11/06/2021   LDLCALC 67 11/06/2021   TRIG 74 11/06/2021   CHOLHDL 2.8 11/06/2021    Significant Diagnostic Results in last 30 days:  ECHOCARDIOGRAM COMPLETE  Result Date: 10/24/2023    ECHOCARDIOGRAM REPORT   Patient Name:   Tiffany [name] Date of Exam: 10/24/2023 Medical Rec #:  [id number]        Height:       63.0 in Accession #:    [id number]       Weight:       188.7 lb Date of Birth:  1933/11/30        BSA:          1.886 m Patient Age:    89 years         BP:            132/65 mmHg Patient Gender: F                HR:           72 bpm. Exam Location:  [facility] Procedure: 2D Echo Indications:     CHF- Acute Systolic I50.21  History:         Patient has prior history of Echocardiogram examinations.  Sonographer:     Elwin Sleight RDCS Referring Phys:  [id number] Loyce Dys Diagnosing Phys: Julien Nordmann MD  Sonographer Comments: Suboptimal parasternal window, suboptimal apical window and suboptimal subcostal window. Image acquisition challenging due to patient body habitus. IMPRESSIONS  1. Left ventricular ejection fraction, by estimation, is 40 to 45%. Left ventricular ejection fraction by PLAX is 42 %. The left ventricle has mildly decreased function. The left ventricle demonstrates global hypokinesis. Left ventricular diastolic parameters are consistent with Grade I diastolic dysfunction (impaired relaxation).  2. Right ventricular systolic function is normal. The right ventricular size is normal. There is moderately elevated pulmonary artery systolic pressure. The estimated right ventricular systolic pressure is 54.6 mmHg.  3. The mitral valve is normal in structure. Mild mitral valve regurgitation. No evidence of mitral stenosis.  4. The aortic valve has an indeterminant number of cusps. Aortic valve regurgitation is not visualized. Aortic valve sclerosis is present, with no evidence of aortic valve stenosis.  5. The inferior vena cava is normal in size with greater than 50% respiratory variability, suggesting right atrial pressure of 3 mmHg. FINDINGS  Left Ventricle: Left ventricular ejection fraction, by estimation, is 40 to 45%. Left ventricular ejection fraction by PLAX is 42 %. The left ventricle has mildly decreased function. The left ventricle demonstrates global hypokinesis. The left ventricular internal cavity size was normal in size. There is no left ventricular hypertrophy. Left ventricular diastolic parameters are consistent with Grade I diastolic dysfunction  (impaired relaxation). Right Ventricle: The right ventricular size is normal. No increase in right ventricular wall thickness. Right ventricular systolic function is normal. There is moderately elevated pulmonary artery systolic pressure. The tricuspid regurgitant velocity is 3.52 m/s, and with an assumed right atrial pressure of 5 mmHg, the estimated right ventricular systolic pressure is 54.6 mmHg. Left Atrium: Left atrial size was normal in size. Right Atrium: Right atrial size was normal in size. Pericardium: There is no evidence of pericardial effusion. Mitral Valve: The mitral valve is normal in structure. Mild mitral valve regurgitation. No evidence of mitral valve stenosis. Tricuspid Valve: The tricuspid valve is normal in structure. Tricuspid valve regurgitation  is mild . No evidence of tricuspid stenosis. Aortic Valve: The aortic valve has an indeterminant number of cusps. Aortic valve regurgitation is not visualized. Aortic valve sclerosis is present, with no evidence of aortic valve stenosis. Aortic valve peak gradient measures 8.4 mmHg. Pulmonic Valve: The pulmonic valve was normal in structure. Pulmonic valve regurgitation is not visualized. No evidence of pulmonic stenosis. Aorta: The aortic root is normal in size and structure. Venous: The inferior vena cava is normal in size with greater than 50% respiratory variability, suggesting right atrial pressure of 3 mmHg. IAS/Shunts: No atrial level shunt detected by color flow Doppler.  LEFT VENTRICLE PLAX 2D LV EF:         Left            Diastology                ventricular     LV e' medial:    4.24 cm/s                ejection        LV E/e' medial:  20.1                fraction by     LV e' lateral:   7.83 cm/s                PLAX is 42      LV E/e' lateral: 10.9                %. LVIDd:         3.75 cm LVIDs:         3.00 cm LV PW:         1.25 cm LV IVS:        1.25 cm LVOT diam:     1.90 cm LV SV:         52 LV SV Index:   28 LVOT Area:     2.84  cm  LV Volumes (MOD) LV vol d, MOD    79.9 ml A4C: LV SV MOD A4C:   79.9 ml RIGHT VENTRICLE RV Basal diam:  3.30 cm RV S prime:     17.00 cm/s TAPSE (M-mode): 2.2 cm LEFT ATRIUM             Index        RIGHT ATRIUM           Index LA diam:        3.40 cm 1.80 cm/m   RA Area:     12.30 cm LA Vol (A2C):   48.4 ml 25.66 ml/m  RA Volume:   26.20 ml  13.89 ml/m LA Vol (A4C):   48.4 ml 25.66 ml/m LA Biplane Vol: 48.7 ml 25.82 ml/m  AORTIC VALVE                 PULMONIC VALVE AV Area (Vmax): 1.98 cm     PV Vmax:        1.44 m/s AV Vmax:        144.50 cm/s  PV Peak grad:   8.2 mmHg AV Peak Grad:   8.4 mmHg     RVOT Peak grad: 4 mmHg LVOT Vmax:      101.00 cm/s LVOT Vmean:     59.100 cm/s LVOT VTI:       0.185 m  AORTA Ao Root diam: 2.50 cm Ao Asc diam:  3.60 cm MITRAL VALVE  TRICUSPID VALVE MV Area (PHT): 3.89 cm    TR Peak grad:   49.6 mmHg MV Decel Time: 195 msec    TR Vmax:        352.00 cm/s MV E velocity: 85.40 cm/s MV A velocity: 93.10 cm/s  SHUNTS MV E/A ratio:  0.92        Systemic VTI:  0.18 m                            Systemic Diam: 1.90 cm Julien Nordmann MD Electronically signed by Julien Nordmann MD Signature Date/Time: 10/24/2023/9:32:01 AM    Final    MR ABDOMEN W WO CONTRAST  Result Date: 10/22/2023 CLINICAL DATA:  Characterize left renal mass identified by prior ultrasound EXAM: MRI ABDOMEN WITHOUT AND WITH CONTRAST TECHNIQUE: Multiplanar multisequence MR imaging of the abdomen was performed both before and after the administration of intravenous contrast. CONTRAST:  7.50mL GADAVIST GADOBUTROL 1 MMOL/ML IV SOLN COMPARISON:  Renal ultrasound, 10/19/2023, CT abdomen pelvis, 11/05/2021 FINDINGS: Examination is generally limited by breath motion artifact, particularly multiphasic contrast enhanced sequences. Lower chest: No acute abnormality. Hepatobiliary: No solid liver abnormality is seen. No gallstones, gallbladder wall thickening, or biliary dilatation. Pancreas: Severely atrophic  pancreas. Numerous fluid signal cystic lesions scattered throughout the pancreas, largest in the pancreatic neck measuring 1.2 x 0.9 cm (series 4, image 20). Appearance not obviously changed compared to prior noncontrast examination dated 11/05/2021. No solid component or suspicious contrast enhancement. No acute inflammatory findings. Spleen: Normal in size without significant abnormality. Adrenals/Urinary Tract: Adrenal glands are unremarkable. Arterially enhancing exophytic mass arising from the peripheral inferior pole of the left kidney measuring 1.7 x 1.4 cm (series 15, image 63). Additional benign fluid signal renal cortical and parapelvic cysts, for which no specific further follow-up or characterization is required. No obvious calculi or hydronephrosis. Stomach/Bowel: Stomach is within normal limits. No evidence of bowel wall thickening, distention, or inflammatory changes. Vascular/Lymphatic: Aortic atherosclerosis. No enlarged abdominal lymph nodes. Other: No abdominal wall hernia or abnormality. No ascites. Musculoskeletal: No acute or significant osseous findings. IMPRESSION: 1. Examination is generally limited by breath motion artifact, particularly multiphasic contrast enhanced sequences. 2. Within this limitation, arterially enhancing exophytic mass arising from the peripheral inferior pole of the left kidney measuring 1.7 x 1.4 cm, consistent with a small renal cell carcinoma. 3. No evidence of obvious renal vein invasion, lymphadenopathy, or metastatic disease in the abdomen. 4. Numerous fluid signal cystic lesions scattered throughout the pancreas, largest in the pancreatic neck measuring 1.2 x 0.9 cm. Appearance not obviously changed compared to prior noncontrast examination dated 11/05/2021. No solid component or suspicious contrast enhancement. 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 As there is no observed increased risk of malignancy for such lesions smaller than 2 cm, especially given  evidence of other primary malignancy, initially established imaging stability and advanced patient age, no specific further follow-up or characterization is required. Aortic Atherosclerosis (ICD10-I70.0). Electronically Signed   By: Jearld Lesch M.D.   On: 10/22/2023 21:35   PERIPHERAL VASCULAR CATHETERIZATION  Result Date: 10/22/2023 See surgical note for result.  US RENAL  Result Date: 10/19/2023 CLINICAL DATA:  Acute renal injury EXAM: RENAL / URINARY TRACT ULTRASOUND COMPLETE COMPARISON:  03/10/2023 FINDINGS: Right Kidney: Renal measurements: 10.2 x 5.2 x 4.9 cm. = volume: 134 mL. Moderate hydronephrosis is noted. This is new from the prior exam. Left Kidney: Renal measurements: 11.4 x 4.7 x 5.1 cm. =  volume: 143 mL. Moderate hydronephrosis is noted similar to that seen CT. Stable 1.9 cm echogenic mass is noted in the midportion of the left kidney. Bladder: Stent is noted within the bladder. The proximal aspect is not well appreciated on this exam. Other: None. IMPRESSION: Moderate hydronephrosis increased on the right when compared with the prior exam and stable on the left. Left ureteral stent. Only the distal aspect of the stent is visualized. Echogenic mass within the left kidney similar to that noted on prior plain film examination. Nonemergent MRI is again recommended for further evaluation. Electronically Signed   By: Alcide Clever M.D.   On: 10/19/2023 20:36   DG Tibia/Fibula Right  Result Date: 10/19/2023 CLINICAL DATA:  Cellulitis.  Bilateral swelling and edema. EXAM: RIGHT TIBIA AND FIBULA - 2 VIEW COMPARISON:  None Available. FINDINGS: There is diffuse moderate subcutaneous fat edema and swelling. Additional large patient body habitus. Mildly decreased bone mineralization. Severe medial compartment of the knee joint space narrowing with mild peripheral osteophytosis. Moderate to severe patellofemoral joint space narrowing with moderate inferior and mild superior patellar degenerative  osteophytes. Mild tibiotalar joint space narrowing. No acute fracture or dislocation. Moderate tarsometatarsal joint space narrowing on lateral view. Moderate atherosclerotic calcifications. IMPRESSION: 1. Diffuse moderate subcutaneous fat edema and swelling. 2. Severe medial compartment and moderate to severe patellofemoral compartment osteoarthritis of the right knee. Electronically Signed   By: Neita Garnet M.D.   On: 10/19/2023 13:56   DG Tibia/Fibula Left  Result Date: 10/19/2023 CLINICAL DATA:  Bilateral leg swelling and edema.  Cellulitis. EXAM: LEFT TIBIA AND FIBULA - 2 VIEW COMPARISON:  Left knee radiographs 05/23/2019 FINDINGS: Status post total left knee arthroplasty. Interval removal of the prior anterior surgical skin staples on the prior radiographs immediately following surgery. Resolution of the prior postsurgical intra-articular air of the left knee. No left knee joint effusion. No perihardware lucency is seen to indicate hardware failure or loosening. No acute fracture or dislocation. Mild tibiotalar osteoarthritis. Moderate tarsometatarsal joint space narrowing, subchondral sclerosis, subchondral cystic change, peripheral osteophytosis diffusely. Moderate diffuse subcutaneous fat edema and swelling. Moderate atherosclerotic calcifications. IMPRESSION: 1. Status post total left knee arthroplasty without evidence of hardware failure or loosening. 2. Moderate partially visualized tarsometatarsal osteoarthritis. 3. Moderate diffuse subcutaneous fat edema and swelling consistent with reported cellulitis. Electronically Signed   By: Neita Garnet M.D.   On: 10/19/2023 13:55   DG Chest 1 View  Result Date: 10/19/2023 CLINICAL DATA:  CHF EXAM: CHEST  1 VIEW COMPARISON:  Chest x-ray 11/05/2021 FINDINGS: No consolidation, pneumothorax or effusion. No edema. Normal cardiopericardial silhouette. Degenerative changes of the spine. Advanced degenerative changes of the shoulders,  left-greater-than-right. IMPRESSION: No acute cardiopulmonary disease. Electronically Signed   By: Karen Kays M.D.   On: 10/19/2023 13:22    Assessment/Plan 1. ESRD (end stage renal disease) on dialysis [facility]) -continue to HD and nephrology follow up   2. Urinary retention Will remove foley today prior to DC later this week and to have urology follow up once discharge If unable to void to reinsert catheter.   3. Chronic congestive heart failure, unspecified heart failure type (HCC) -evuolemic at this time. Continue current regimen.   4. Chronic pain of left knee Improved after injection in hospitial. Continue pain regimen.   5. Cellulitis of lower extremity, unspecified laterality -completed treatment, pain and redness has improved. Continue treatment for LE venous ulcer.  PCP to follow up Continues LE wraps  6. Atrial fibrillation, chronic (HCC) Rate  controlled.  - apixaban (ELIQUIS) 2.5 MG TABS tablet; Take 1 tablet (2.5 mg total) by mouth 2 (two) times daily.  Dispense: 60 tablet; Refill: 0  7. Renal cell carcinoma of left kidney (HCC) Small area noted on left kidney, has had follow up with urologist and plans to continue to follow for monitoring.   8. Type 2 diabetes mellitus with stage 4 chronic kidney disease, with long-term current use of insulin (HCC) -A1c elevated on last admission and blood sugars ranging from 150-500 at [facility]. On SSI per home regimen- Continues on home regimen of insulin, will need follow up with PCP  9. Chronic gout without tophus, unspecified cause, unspecified site No current flare - allopurinol (ZYLOPRIM) 100 MG tablet; Take 0.5 tablets (50 mg total) by mouth daily.  Dispense: 15 tablet; Refill: 0  pt is stable for discharge-will need PT/OT/nursing  per home health. No DME needed. Rx sent via epic for 1 month supply on medication pt requested, she reported she had other medication at home already. will need to follow up with PCP within 2  weeks.   Janene Harvey. Biagio Borg [facility] [phone]

## 2023-11-04 NOTE — Telephone Encounter (Signed)
Blood sugar 504. She was given novolog 20 units and insulin glargine 10 units a few minutes prior to call. Advised to recheck sugars within 45 minutes to 1 hour. If blood glucose remains > 400, advised to call on call provider.

## 2023-11-07 DIAGNOSIS — I447 Left bundle-branch block, unspecified: Secondary | ICD-10-CM | POA: Diagnosis not present

## 2023-11-07 DIAGNOSIS — I051 Rheumatic mitral insufficiency: Secondary | ICD-10-CM | POA: Diagnosis not present

## 2023-11-07 DIAGNOSIS — L03115 Cellulitis of right lower limb: Secondary | ICD-10-CM | POA: Diagnosis not present

## 2023-11-07 DIAGNOSIS — D631 Anemia in chronic kidney disease: Secondary | ICD-10-CM | POA: Diagnosis not present

## 2023-11-07 DIAGNOSIS — N186 End stage renal disease: Secondary | ICD-10-CM | POA: Diagnosis not present

## 2023-11-07 DIAGNOSIS — I480 Paroxysmal atrial fibrillation: Secondary | ICD-10-CM | POA: Diagnosis not present

## 2023-11-07 DIAGNOSIS — E1122 Type 2 diabetes mellitus with diabetic chronic kidney disease: Secondary | ICD-10-CM | POA: Diagnosis not present

## 2023-11-07 DIAGNOSIS — M1711 Unilateral primary osteoarthritis, right knee: Secondary | ICD-10-CM | POA: Diagnosis not present

## 2023-11-07 DIAGNOSIS — I5023 Acute on chronic systolic (congestive) heart failure: Secondary | ICD-10-CM | POA: Diagnosis not present

## 2023-11-07 DIAGNOSIS — I872 Venous insufficiency (chronic) (peripheral): Secondary | ICD-10-CM | POA: Diagnosis not present

## 2023-11-07 DIAGNOSIS — L89626 Pressure-induced deep tissue damage of left heel: Secondary | ICD-10-CM | POA: Diagnosis not present

## 2023-11-07 DIAGNOSIS — I700 Atherosclerosis of aorta: Secondary | ICD-10-CM | POA: Diagnosis not present

## 2023-11-07 DIAGNOSIS — C642 Malignant neoplasm of left kidney, except renal pelvis: Secondary | ICD-10-CM | POA: Diagnosis not present

## 2023-11-07 DIAGNOSIS — L97811 Non-pressure chronic ulcer of other part of right lower leg limited to breakdown of skin: Secondary | ICD-10-CM | POA: Diagnosis not present

## 2023-11-07 DIAGNOSIS — E1151 Type 2 diabetes mellitus with diabetic peripheral angiopathy without gangrene: Secondary | ICD-10-CM | POA: Diagnosis not present

## 2023-11-07 DIAGNOSIS — R339 Retention of urine, unspecified: Secondary | ICD-10-CM | POA: Diagnosis not present

## 2023-11-07 DIAGNOSIS — D630 Anemia in neoplastic disease: Secondary | ICD-10-CM | POA: Diagnosis not present

## 2023-11-07 DIAGNOSIS — I132 Hypertensive heart and chronic kidney disease with heart failure and with stage 5 chronic kidney disease, or end stage renal disease: Secondary | ICD-10-CM | POA: Diagnosis not present

## 2023-11-07 DIAGNOSIS — G8929 Other chronic pain: Secondary | ICD-10-CM | POA: Diagnosis not present

## 2023-11-07 DIAGNOSIS — M48061 Spinal stenosis, lumbar region without neurogenic claudication: Secondary | ICD-10-CM | POA: Diagnosis not present

## 2023-11-07 DIAGNOSIS — Z992 Dependence on renal dialysis: Secondary | ICD-10-CM | POA: Diagnosis not present

## 2023-11-07 DIAGNOSIS — Z794 Long term (current) use of insulin: Secondary | ICD-10-CM | POA: Diagnosis not present

## 2023-11-07 DIAGNOSIS — M25562 Pain in left knee: Secondary | ICD-10-CM | POA: Diagnosis not present

## 2023-11-07 DIAGNOSIS — I2510 Atherosclerotic heart disease of native coronary artery without angina pectoris: Secondary | ICD-10-CM | POA: Diagnosis not present

## 2023-11-16 ENCOUNTER — Emergency Department: Payer: PPO

## 2023-11-16 ENCOUNTER — Inpatient Hospital Stay: Payer: PPO

## 2023-11-16 ENCOUNTER — Inpatient Hospital Stay
Admission: EM | Admit: 2023-11-16 | Discharge: 2023-11-18 | DRG: 871 | Disposition: A | Discharge status: Skilled Nursing Facility | Payer: PPO | Source: Skilled Nursing Facility | Attending: Student | Admitting: Student

## 2023-11-16 ENCOUNTER — Other Ambulatory Visit: Payer: Self-pay

## 2023-11-16 ENCOUNTER — Ambulatory Visit: Payer: PPO | Admitting: Physician Assistant

## 2023-11-16 DIAGNOSIS — M79673 Pain in unspecified foot: Secondary | ICD-10-CM | POA: Diagnosis not present

## 2023-11-16 DIAGNOSIS — Z992 Dependence on renal dialysis: Secondary | ICD-10-CM

## 2023-11-16 DIAGNOSIS — I5020 Unspecified systolic (congestive) heart failure: Secondary | ICD-10-CM | POA: Diagnosis not present

## 2023-11-16 DIAGNOSIS — Z0389 Encounter for observation for other suspected diseases and conditions ruled out: Secondary | ICD-10-CM | POA: Diagnosis not present

## 2023-11-16 DIAGNOSIS — I480 Paroxysmal atrial fibrillation: Secondary | ICD-10-CM | POA: Diagnosis present

## 2023-11-16 DIAGNOSIS — R000 Tachycardia, unspecified: Secondary | ICD-10-CM | POA: Diagnosis not present

## 2023-11-16 DIAGNOSIS — E1151 Type 2 diabetes mellitus with diabetic peripheral angiopathy without gangrene: Secondary | ICD-10-CM | POA: Diagnosis present

## 2023-11-16 DIAGNOSIS — Z794 Long term (current) use of insulin: Secondary | ICD-10-CM

## 2023-11-16 DIAGNOSIS — R0902 Hypoxemia: Secondary | ICD-10-CM | POA: Diagnosis not present

## 2023-11-16 DIAGNOSIS — Y846 Urinary catheterization as the cause of abnormal reaction of the patient, or of later complication, without mention of misadventure at the time of the procedure: Secondary | ICD-10-CM | POA: Diagnosis present

## 2023-11-16 DIAGNOSIS — I130 Hypertensive heart and chronic kidney disease with heart failure and stage 1 through stage 4 chronic kidney disease, or unspecified chronic kidney disease: Secondary | ICD-10-CM | POA: Diagnosis present

## 2023-11-16 DIAGNOSIS — N186 End stage renal disease: Secondary | ICD-10-CM | POA: Diagnosis not present

## 2023-11-16 DIAGNOSIS — L89626 Pressure-induced deep tissue damage of left heel: Secondary | ICD-10-CM | POA: Diagnosis present

## 2023-11-16 DIAGNOSIS — L89896 Pressure-induced deep tissue damage of other site: Secondary | ICD-10-CM | POA: Diagnosis not present

## 2023-11-16 DIAGNOSIS — Z7401 Bed confinement status: Secondary | ICD-10-CM | POA: Diagnosis not present

## 2023-11-16 DIAGNOSIS — I5022 Chronic systolic (congestive) heart failure: Secondary | ICD-10-CM | POA: Diagnosis not present

## 2023-11-16 DIAGNOSIS — L03115 Cellulitis of right lower limb: Secondary | ICD-10-CM | POA: Diagnosis present

## 2023-11-16 DIAGNOSIS — Z515 Encounter for palliative care: Secondary | ICD-10-CM

## 2023-11-16 DIAGNOSIS — C642 Malignant neoplasm of left kidney, except renal pelvis: Secondary | ICD-10-CM | POA: Diagnosis not present

## 2023-11-16 DIAGNOSIS — Z888 Allergy status to other drugs, medicaments and biological substances status: Secondary | ICD-10-CM

## 2023-11-16 DIAGNOSIS — Z79899 Other long term (current) drug therapy: Secondary | ICD-10-CM

## 2023-11-16 DIAGNOSIS — Z6836 Body mass index (BMI) 36.0-36.9, adult: Secondary | ICD-10-CM

## 2023-11-16 DIAGNOSIS — G9340 Encephalopathy, unspecified: Secondary | ICD-10-CM | POA: Diagnosis not present

## 2023-11-16 DIAGNOSIS — Z66 Do not resuscitate: Secondary | ICD-10-CM | POA: Diagnosis not present

## 2023-11-16 DIAGNOSIS — I132 Hypertensive heart and chronic kidney disease with heart failure and with stage 5 chronic kidney disease, or end stage renal disease: Secondary | ICD-10-CM | POA: Diagnosis not present

## 2023-11-16 DIAGNOSIS — E119 Type 2 diabetes mellitus without complications: Secondary | ICD-10-CM

## 2023-11-16 DIAGNOSIS — A419 Sepsis, unspecified organism: Principal | ICD-10-CM | POA: Diagnosis present

## 2023-11-16 DIAGNOSIS — L03116 Cellulitis of left lower limb: Secondary | ICD-10-CM | POA: Diagnosis not present

## 2023-11-16 DIAGNOSIS — G8929 Other chronic pain: Secondary | ICD-10-CM | POA: Diagnosis present

## 2023-11-16 DIAGNOSIS — I878 Other specified disorders of veins: Secondary | ICD-10-CM | POA: Diagnosis present

## 2023-11-16 DIAGNOSIS — T83511A Infection and inflammatory reaction due to indwelling urethral catheter, initial encounter: Principal | ICD-10-CM | POA: Diagnosis present

## 2023-11-16 DIAGNOSIS — E1122 Type 2 diabetes mellitus with diabetic chronic kidney disease: Secondary | ICD-10-CM | POA: Diagnosis not present

## 2023-11-16 DIAGNOSIS — D631 Anemia in chronic kidney disease: Secondary | ICD-10-CM | POA: Diagnosis present

## 2023-11-16 DIAGNOSIS — E785 Hyperlipidemia, unspecified: Secondary | ICD-10-CM | POA: Diagnosis present

## 2023-11-16 DIAGNOSIS — G9341 Metabolic encephalopathy: Secondary | ICD-10-CM | POA: Diagnosis not present

## 2023-11-16 DIAGNOSIS — Z7189 Other specified counseling: Secondary | ICD-10-CM | POA: Diagnosis not present

## 2023-11-16 DIAGNOSIS — E669 Obesity, unspecified: Secondary | ICD-10-CM | POA: Diagnosis not present

## 2023-11-16 DIAGNOSIS — Z981 Arthrodesis status: Secondary | ICD-10-CM

## 2023-11-16 DIAGNOSIS — N2889 Other specified disorders of kidney and ureter: Secondary | ICD-10-CM | POA: Diagnosis present

## 2023-11-16 DIAGNOSIS — N390 Urinary tract infection, site not specified: Secondary | ICD-10-CM | POA: Diagnosis not present

## 2023-11-16 DIAGNOSIS — Z7984 Long term (current) use of oral hypoglycemic drugs: Secondary | ICD-10-CM

## 2023-11-16 DIAGNOSIS — K5900 Constipation, unspecified: Secondary | ICD-10-CM | POA: Diagnosis present

## 2023-11-16 DIAGNOSIS — I5023 Acute on chronic systolic (congestive) heart failure: Secondary | ICD-10-CM | POA: Diagnosis not present

## 2023-11-16 DIAGNOSIS — A4152 Sepsis due to Pseudomonas: Secondary | ICD-10-CM | POA: Diagnosis present

## 2023-11-16 DIAGNOSIS — R0989 Other specified symptoms and signs involving the circulatory and respiratory systems: Secondary | ICD-10-CM | POA: Diagnosis not present

## 2023-11-16 DIAGNOSIS — I482 Chronic atrial fibrillation, unspecified: Secondary | ICD-10-CM | POA: Diagnosis present

## 2023-11-16 DIAGNOSIS — E1142 Type 2 diabetes mellitus with diabetic polyneuropathy: Secondary | ICD-10-CM | POA: Diagnosis present

## 2023-11-16 DIAGNOSIS — I2510 Atherosclerotic heart disease of native coronary artery without angina pectoris: Secondary | ICD-10-CM | POA: Diagnosis not present

## 2023-11-16 DIAGNOSIS — I447 Left bundle-branch block, unspecified: Secondary | ICD-10-CM | POA: Diagnosis present

## 2023-11-16 DIAGNOSIS — Z881 Allergy status to other antibiotic agents status: Secondary | ICD-10-CM

## 2023-11-16 DIAGNOSIS — R6520 Severe sepsis without septic shock: Secondary | ICD-10-CM | POA: Diagnosis not present

## 2023-11-16 DIAGNOSIS — I890 Lymphedema, not elsewhere classified: Secondary | ICD-10-CM | POA: Diagnosis present

## 2023-11-16 DIAGNOSIS — Z87442 Personal history of urinary calculi: Secondary | ICD-10-CM

## 2023-11-16 DIAGNOSIS — L0390 Cellulitis, unspecified: Secondary | ICD-10-CM

## 2023-11-16 DIAGNOSIS — Z96652 Presence of left artificial knee joint: Secondary | ICD-10-CM | POA: Diagnosis present

## 2023-11-16 DIAGNOSIS — M542 Cervicalgia: Secondary | ICD-10-CM | POA: Diagnosis present

## 2023-11-16 DIAGNOSIS — Z87440 Personal history of urinary (tract) infections: Secondary | ICD-10-CM

## 2023-11-16 DIAGNOSIS — R404 Transient alteration of awareness: Secondary | ICD-10-CM | POA: Diagnosis not present

## 2023-11-16 DIAGNOSIS — R5383 Other fatigue: Secondary | ICD-10-CM | POA: Diagnosis present

## 2023-11-16 DIAGNOSIS — R0689 Other abnormalities of breathing: Secondary | ICD-10-CM | POA: Diagnosis not present

## 2023-11-16 DIAGNOSIS — Z885 Allergy status to narcotic agent status: Secondary | ICD-10-CM

## 2023-11-16 DIAGNOSIS — R739 Hyperglycemia, unspecified: Secondary | ICD-10-CM | POA: Diagnosis not present

## 2023-11-16 DIAGNOSIS — R4182 Altered mental status, unspecified: Secondary | ICD-10-CM | POA: Diagnosis not present

## 2023-11-16 DIAGNOSIS — D630 Anemia in neoplastic disease: Secondary | ICD-10-CM | POA: Diagnosis not present

## 2023-11-16 DIAGNOSIS — I959 Hypotension, unspecified: Secondary | ICD-10-CM | POA: Diagnosis not present

## 2023-11-16 DIAGNOSIS — M545 Low back pain, unspecified: Secondary | ICD-10-CM | POA: Diagnosis present

## 2023-11-16 DIAGNOSIS — Z7901 Long term (current) use of anticoagulants: Secondary | ICD-10-CM

## 2023-11-16 DIAGNOSIS — I5043 Acute on chronic combined systolic (congestive) and diastolic (congestive) heart failure: Secondary | ICD-10-CM | POA: Diagnosis not present

## 2023-11-16 LAB — URINALYSIS, W/ REFLEX TO CULTURE (INFECTION SUSPECTED)
Bacteria, UA: NONE SEEN
Bilirubin Urine: NEGATIVE
Glucose, UA: NEGATIVE mg/dL
Ketones, ur: 5 mg/dL — AB
Nitrite: NEGATIVE
Protein, ur: >=300 mg/dL — AB
Specific Gravity, Urine: 1.023 (ref 1.005–1.030)
Squamous Epithelial / HPF: 0 /[HPF] (ref 0–5)
WBC, UA: >50 WBC/hpf (ref 0–5)
pH: 5 (ref 5.0–8.0)

## 2023-11-16 LAB — COMPREHENSIVE METABOLIC PANEL
ALT: 28 U/L (ref 0–44)
AST: 38 U/L (ref 15–41)
Albumin: 2.8 g/dL — ABNORMAL LOW (ref 3.5–5.0)
Alkaline Phosphatase: 81 U/L (ref 38–126)
Anion gap: 15 (ref 5–15)
BUN: 29 mg/dL — ABNORMAL HIGH (ref 8–23)
CO2: 26 mmol/L (ref 22–32)
Calcium: 8.3 mg/dL — ABNORMAL LOW (ref 8.9–10.3)
Chloride: 89 mmol/L — ABNORMAL LOW (ref 98–111)
Creatinine, Ser: 3.16 mg/dL — ABNORMAL HIGH (ref 0.44–1.00)
GFR, Estimated: 14 mL/min — ABNORMAL LOW (ref >60)
Glucose, Bld: 229 mg/dL — ABNORMAL HIGH (ref 70–99)
Potassium: 3.4 mmol/L — ABNORMAL LOW (ref 3.5–5.1)
Sodium: 130 mmol/L — ABNORMAL LOW (ref 135–145)
Total Bilirubin: 1 mg/dL (ref <1.2)
Total Protein: 5.9 g/dL — ABNORMAL LOW (ref 6.5–8.1)

## 2023-11-16 LAB — CBC WITH DIFFERENTIAL/PLATELET
Abs Immature Granulocytes: 0.24 10*3/uL — ABNORMAL HIGH (ref 0.00–0.07)
Basophils Absolute: 0.1 10*3/uL (ref 0.0–0.1)
Basophils Relative: 1 %
Eosinophils Absolute: 0 10*3/uL (ref 0.0–0.5)
Eosinophils Relative: 0 %
HCT: 34.4 % — ABNORMAL LOW (ref 36.0–46.0)
Hemoglobin: 10.7 g/dL — ABNORMAL LOW (ref 12.0–15.0)
Immature Granulocytes: 2 %
Lymphocytes Relative: 14 %
Lymphs Abs: 2 10*3/uL (ref 0.7–4.0)
MCH: 27.4 pg (ref 26.0–34.0)
MCHC: 31.1 g/dL (ref 30.0–36.0)
MCV: 88 fL (ref 80.0–100.0)
Monocytes Absolute: 0.9 10*3/uL (ref 0.1–1.0)
Monocytes Relative: 6 %
Neutro Abs: 11.6 10*3/uL — ABNORMAL HIGH (ref 1.7–7.7)
Neutrophils Relative %: 77 %
Platelets: 186 10*3/uL (ref 150–400)
RBC: 3.91 MIL/uL (ref 3.87–5.11)
RDW: 17 % — ABNORMAL HIGH (ref 11.5–15.5)
WBC: 14.8 10*3/uL — ABNORMAL HIGH (ref 4.0–10.5)
nRBC: 0 % (ref 0.0–0.2)

## 2023-11-16 LAB — LACTIC ACID, PLASMA
Lactic Acid, Venous: 2.9 mmol/L (ref 0.5–1.9)
Lactic Acid, Venous: 2.3 mmol/L (ref 0.5–1.9)
Lactic Acid, Venous: 2.4 mmol/L (ref 0.5–1.9)

## 2023-11-16 LAB — GLUCOSE, CAPILLARY: Glucose-Capillary: 167 mg/dL — ABNORMAL HIGH (ref 70–99)

## 2023-11-16 MED ORDER — ALLOPURINOL 100 MG PO TABS
50.0000 mg | ORAL_TABLET | Freq: Every day | ORAL | Status: DC
  Administered 2023-11-17: 50 mg via ORAL
  Filled 2023-11-16: qty 0.5

## 2023-11-16 MED ORDER — INSULIN ASPART 100 UNIT/ML IJ SOLN: 0.0000 [IU] | INTRAMUSCULAR | Status: DC

## 2023-11-16 MED ORDER — VANCOMYCIN HCL IN DEXTROSE 1-5 GM/200ML-% IV SOLN
1000.0000 mg | Freq: Once | INTRAVENOUS | Status: AC
  Administered 2023-11-16: 1000 mg via INTRAVENOUS
  Filled 2023-11-16: qty 200

## 2023-11-16 MED ORDER — MIDODRINE HCL 5 MG PO TABS
10.0000 mg | ORAL_TABLET | Freq: Three times a day (TID) | ORAL | Status: DC
  Administered 2023-11-16 – 2023-11-17 (×3): 10 mg via ORAL
  Filled 2023-11-16 (×3): qty 2

## 2023-11-16 MED ORDER — LACTATED RINGERS IV BOLUS
1000.0000 mL | Freq: Once | INTRAVENOUS | Status: AC
Start: 2023-11-16 — End: 2023-11-16
  Administered 2023-11-16: 1000 mL via INTRAVENOUS

## 2023-11-16 MED ORDER — ACETAMINOPHEN 325 MG PO TABS
650.0000 mg | ORAL_TABLET | Freq: Four times a day (QID) | ORAL | Status: DC | PRN
  Administered 2023-11-16: 650 mg via ORAL
  Filled 2023-11-16: qty 2

## 2023-11-16 MED ORDER — ONDANSETRON HCL 4 MG/2ML IJ SOLN
4.0000 mg | Freq: Four times a day (QID) | INTRAMUSCULAR | Status: DC | PRN
  Administered 2023-11-17: 4 mg via INTRAVENOUS
  Filled 2023-11-16: qty 2

## 2023-11-16 MED ORDER — SODIUM CHLORIDE 0.9 % IV SOLN
2.0000 g | Freq: Once | INTRAVENOUS | Status: AC
  Administered 2023-11-16: 2 g via INTRAVENOUS
  Filled 2023-11-16: qty 12.5

## 2023-11-16 MED ORDER — CEFEPIME HCL 2 G IV SOLR
2.0000 g | INTRAVENOUS | Status: DC
  Administered 2023-11-17: 2 g via INTRAVENOUS
  Filled 2023-11-16: qty 12.5

## 2023-11-16 MED ORDER — MORPHINE SULFATE (PF) 2 MG/ML IV SOLN: 2.0000 mg | INTRAVENOUS | Status: DC | PRN

## 2023-11-16 MED ORDER — LACTATED RINGERS IV BOLUS
1000.0000 mL | Freq: Once | INTRAVENOUS | Status: AC
  Administered 2023-11-16: 1000 mL via INTRAVENOUS

## 2023-11-16 MED ORDER — ORAL CARE MOUTH RINSE: 15.0000 mL | OROMUCOSAL | Status: DC | PRN

## 2023-11-16 MED ORDER — INSULIN GLARGINE-YFGN 100 UNIT/ML ~~LOC~~ SOLN: 8.0000 [IU] | Freq: Every day | SUBCUTANEOUS | Status: DC

## 2023-11-16 MED ORDER — ONDANSETRON HCL 4 MG PO TABS: 4.0000 mg | ORAL_TABLET | Freq: Four times a day (QID) | ORAL | Status: DC | PRN

## 2023-11-16 MED ORDER — APIXABAN 2.5 MG PO TABS
2.5000 mg | ORAL_TABLET | Freq: Two times a day (BID) | ORAL | Status: DC
  Administered 2023-11-16 – 2023-11-17 (×2): 2.5 mg via ORAL
  Filled 2023-11-16 (×2): qty 1

## 2023-11-16 MED ORDER — AMIODARONE HCL 200 MG PO TABS
200.0000 mg | ORAL_TABLET | Freq: Every day | ORAL | Status: DC
  Administered 2023-11-17: 200 mg via ORAL
  Filled 2023-11-16: qty 1

## 2023-11-16 MED ORDER — CHLORHEXIDINE GLUCONATE CLOTH 2 % EX PADS
6.0000 | MEDICATED_PAD | Freq: Every day | CUTANEOUS | Status: DC
  Administered 2023-11-17: 6 via TOPICAL

## 2023-11-16 MED ORDER — HEPARIN SODIUM (PORCINE) 5000 UNIT/ML IJ SOLN: 5000.0000 [IU] | Freq: Three times a day (TID) | INTRAMUSCULAR | Status: DC

## 2023-11-16 MED ORDER — VANCOMYCIN VARIABLE DOSE PER UNSTABLE RENAL FUNCTION (PHARMACIST DOSING): Status: DC

## 2023-11-16 NOTE — Assessment & Plan Note (Signed)
Urinalysis indicative of infection in the setting of sepsis IV cefepime per sepsis protocol with concomitant cellulitis Urine culture Monitor

## 2023-11-16 NOTE — ED Provider Notes (Signed)
[facility] Provider Note    Event Date/Time   First MD Initiated Contact with Patient 11/16/23 [phone]     (approximate)   History   Altered mental status   HPI  Tiffany [name] is a 89 y.o. female who presents to the emergency department today because of concerns for an episode of altered mental status.  Patient presents via EMS.  They state that apparently the patient was unable to answer some questions for her husband this morning.  He was concerned that she might be getting worse from an infection.  The patient is currently being treated for cellulitis of the left leg.  Patient does have significant bilateral edema.  Was scheduled to see wound care center today.  The time my exam patient is awake alert and oriented.  She states she has been having some discomfort in that pain.  Additionally she recently started dialysis.     Physical Exam   Triage Vital Signs: ED Triage Vitals  Encounter Vitals Group     BP --      Systolic BP Percentile --      Diastolic BP Percentile --      Pulse --      Resp --      Temp --      Temp src --      SpO2 --      Weight 11/16/23 0756 198 lb (89.8 kg)     Height 11/16/23 0756 5\' 2"  (1.575 m)     Head Circumference --      Peak Flow --      Pain Score 11/16/23 0755 0     Pain Loc --      Pain Education --      Exclude from Growth Chart --     Most recent vital signs: There were no vitals filed for this visit.  General: Awake, alert, oriented. CV:  Good peripheral perfusion. Tachycardia. Resp:  Normal effort.  Abd:  No distention.  Other:  Significant bilateral lower extremity edema. Erythema and weeping to the left leg.    ED Results / Procedures / Treatments   Labs (all labs ordered are listed, but only abnormal results are displayed) Labs Reviewed - No data to display   EKG  I, Phineas Semen, attending physician, personally viewed and interpreted this EKG  EKG Time: 0807 Rate: 101 Rhythm:  sinus tachycardia Axis: left axis deviation Intervals: qtc 666 QRS: LBBB ST changes: no st elevation Impression: abnormal ekg   RADIOLOGY *** {USE THE WORD "INTERPRETED"!! You MUST document your own interpretation of imaging, as well as the fact that you reviewed the radiologist's report!:1}   PROCEDURES:  Critical Care performed: Yes  CRITICAL CARE Performed by: Phineas Semen   Total critical care time: *** minutes  Critical care time was exclusive of separately billable procedures and treating other patients.  Critical care was necessary to treat or prevent imminent or life-threatening deterioration.  Critical care was time spent personally by me on the following activities: development of treatment plan with patient and/or surrogate as well as nursing, discussions with consultants, evaluation of patient's response to treatment, examination of patient, obtaining history from patient or surrogate, ordering and performing treatments and interventions, ordering and review of laboratory studies, ordering and review of radiographic studies, pulse oximetry and re-evaluation of patient's condition.   Procedures    MEDICATIONS ORDERED IN ED: Medications - No data to display   IMPRESSION / MDM / ASSESSMENT  AND PLAN / ED COURSE  I reviewed the triage vital signs and the nursing notes.                              Differential diagnosis includes, but is not limited to, cellulitis, UTI, pneumonia, CHF  Patient's presentation is most consistent with acute presentation with potential threat to life or bodily function.   The patient is on the cardiac monitor to evaluate for evidence of arrhythmia and/or significant heart rate changes.  Patient presented to the emergency department today because of concerns for an episode of altered mental status in the setting of currently being treated for left leg cellulitis.  On exam patient does have significant bilateral lower extremity  edema with erythema and weeping to the left leg.  She was noted to be tachycardic and slightly tachypneic.  Will send blood work to look for signs of infection.  Additionally patient has a Foley catheter in place.  Will send urine to check for UTI.  ***      FINAL CLINICAL IMPRESSION(S) / ED DIAGNOSES   Final diagnoses:  None     Rx / DC Orders   ED Discharge Orders     None        Note:  This document was prepared using Dragon voice recognition software and may include unintentional dictation errors.

## 2023-11-16 NOTE — ED Notes (Signed)
Attempting to get BP, BP is reading high (250/239) and have tried several times. Will get manual BP.

## 2023-11-16 NOTE — Assessment & Plan Note (Signed)
Noted recurrently lower extremity cellulitis with recent admission October 22 through October 31 for right-sided lower extremity infection Had blood cultures during prior admission growing Klebsiella and Enterobacteralis- s/p course of oral abx 10/30/23 Worsening left lower extremity redness swelling and tenderness to palpation Will place on IV cefepime and vancomycin for infectious coverage Blood cultures drawn  LE u/s to r/o DVT  Wound care consult  Monitor

## 2023-11-16 NOTE — ED Triage Notes (Signed)
Pt to ED from home, [facility]  Weeping edema, cellulitis to legs. On abx treatment currently since 5 days  This AM pt had brief episode of AMS per husband who called EMS, currently alert and oriented X4  EDP at bedside  EMS VS: temp 98.9, HR 100, etco2 26, RR 26, BP 90/50 and 100/40. CBG 313, hx DM Has 20# L hand  Started dialysis 3 weeks ago (access is to chest)

## 2023-11-16 NOTE — Progress Notes (Signed)
[facility]  ROUNDING NOTE   Subjective:   Patient well-known to [place] as we follow her for outpatient hemodialysis. Patient recently started on dialysis for ESRD. She was brought in from nursing facility for increasing confusion and lethargy. Patient presented with a temperature of 100.9 with a heart rate in the 100s. Patient's husband was at the bedside in the intensive care unit when seen today. He states he has spoken with the patient and the family about dialysis and they would like to hold off on further dialysis treatments. This was confirmed with the patient at the bedside today.   Objective:  Vital signs in last 24 hours:  Temp:  [98.1 F (36.7 C)-100.9 F (38.3 C)] 99.3 F (37.4 C) (11/19 1518) Pulse Rate:  [72-106] 100 (11/19 1530) Resp:  [20-29] 29 (11/19 1530) BP: (84-130)/(40-68) 118/50 (11/19 1742) SpO2:  [84 %-100 %] 97 % (11/19 1530) Weight:  [89.8 kg] 89.8 kg (11/19 0756)  Weight change:  Filed Weights   11/16/23 0756  Weight: 89.8 kg    Intake/Output: I/O last 3 completed shifts: In: 1540 [P.O.:240; IV Piggyback:1300] Out: -    Intake/Output this shift:  No intake/output data recorded.  Physical Exam: General: Chronically ill-appearing  Head: Normocephalic, atraumatic. Moist oral mucosal membranes  Eyes: Anicteric  Lungs:  Basilar rales  Heart: S1S2 no rubs  Abdomen:  Soft, nontender  Extremities: 1+ bilateral lower extremity edema  Neurologic: Alert, moving all four extremities  Skin: Mild erythema bilateral lower extremities  Access:  Rt Chest permcath placed on 10/22/23    Basic Metabolic Panel: Recent Labs  Lab 11/16/23 0805  NA 130*  K 3.4*  CL 89*  CO2 26  GLUCOSE 229*  BUN 29*  CREATININE 3.16*  CALCIUM 8.3*    Liver Function Tests: Recent Labs  Lab 11/16/23 0805  AST 38  ALT 28  ALKPHOS 81  BILITOT 1.0  PROT 5.9*  ALBUMIN 2.8*   No results for input(s): "LIPASE", "AMYLASE" in the last 168 hours. No  results for input(s): "AMMONIA" in the last 168 hours.  CBC: Recent Labs  Lab 11/16/23 0805  WBC 14.8*  NEUTROABS 11.6*  HGB 10.7*  HCT 34.4*  MCV 88.0  PLT 186    Cardiac Enzymes: No results for input(s): "CKTOTAL", "CKMB", "CKMBINDEX", "TROPONINI" in the last 168 hours.   BNP: Invalid input(s): "POCBNP"  CBG: Recent Labs  Lab 11/16/23 1525  GLUCAP 167*    Microbiology: Results for orders placed or performed during the hospital encounter of 10/19/23  Blood culture (routine x 2)     Status: Abnormal   Collection Time: 10/19/23  9:10 AM   Specimen: BLOOD  Result Value Ref Range Status   Specimen Description   Final    BLOOD RIGHT WRIST Performed at [facility], [address], [place], [place] [address]    Special Requests   Final    BOTTLES DRAWN AEROBIC AND ANAEROBIC Blood Culture adequate volume Performed at [facility], [address] [place], [place] [address]    Culture  Setup Time   Final    Organism ID to follow GRAM NEGATIVE RODS ANAEROBIC BOTTLE ONLY CRITICAL RESULT CALLED TO, READ BACK BY AND VERIFIED WITH: JASON ROBBINS PHARMD @0000  10/20/23 ASW Performed at [facility], [address] [place], [place] [address]    Culture KLEBSIELLA OXYTOCA (A)  Final   Report Status 10/22/2023 FINAL  Final   Organism ID, Bacteria KLEBSIELLA OXYTOCA  Final  Susceptibility   Klebsiella oxytoca - MIC*    AMPICILLIN >=32 RESISTANT Resistant     CEFEPIME <=0.12 SENSITIVE Sensitive     CEFTAZIDIME <=1 SENSITIVE Sensitive     CEFTRIAXONE <=0.25 SENSITIVE Sensitive     CIPROFLOXACIN <=0.25 SENSITIVE Sensitive     GENTAMICIN <=1 SENSITIVE Sensitive     IMIPENEM <=0.25 SENSITIVE Sensitive     TRIMETH/SULFA <=20 SENSITIVE Sensitive     AMPICILLIN/SULBACTAM 8 SENSITIVE Sensitive     PIP/TAZO <=4 SENSITIVE Sensitive ug/mL    * KLEBSIELLA OXYTOCA  Blood Culture ID Panel (Reflexed)     Status: Abnormal   Collection Time: 10/19/23   9:10 AM  Result Value Ref Range Status   Enterococcus faecalis NOT DETECTED NOT DETECTED Final   Enterococcus Faecium NOT DETECTED NOT DETECTED Final   Listeria monocytogenes NOT DETECTED NOT DETECTED Final   Staphylococcus species NOT DETECTED NOT DETECTED Final   Staphylococcus aureus (BCID) NOT DETECTED NOT DETECTED Final   Staphylococcus epidermidis NOT DETECTED NOT DETECTED Final   Staphylococcus lugdunensis NOT DETECTED NOT DETECTED Final   Streptococcus species NOT DETECTED NOT DETECTED Final   Streptococcus agalactiae NOT DETECTED NOT DETECTED Final   Streptococcus pneumoniae NOT DETECTED NOT DETECTED Final   Streptococcus pyogenes NOT DETECTED NOT DETECTED Final   A.calcoaceticus-baumannii NOT DETECTED NOT DETECTED Final   Bacteroides fragilis NOT DETECTED NOT DETECTED Final   Enterobacterales DETECTED (A) NOT DETECTED Final    Comment: Enterobacterales represent a large order of gram negative bacteria, not a single organism. CRITICAL RESULT CALLED TO, READ BACK BY AND VERIFIED WITH: JASON ROBBINS PHARMD @0000  10/20/23 ASW    Enterobacter cloacae complex NOT DETECTED NOT DETECTED Final   Escherichia coli NOT DETECTED NOT DETECTED Final   Klebsiella aerogenes NOT DETECTED NOT DETECTED Final   Klebsiella oxytoca DETECTED (A) NOT DETECTED Final    Comment: CRITICAL RESULT CALLED TO, READ BACK BY AND VERIFIED WITH: JASON ROBBINS PHARMD @0000  10/20/23 ASW    Klebsiella pneumoniae NOT DETECTED NOT DETECTED Final   Proteus species NOT DETECTED NOT DETECTED Final   Salmonella species NOT DETECTED NOT DETECTED Final   Serratia marcescens NOT DETECTED NOT DETECTED Final   Haemophilus influenzae NOT DETECTED NOT DETECTED Final   Neisseria meningitidis NOT DETECTED NOT DETECTED Final   Pseudomonas aeruginosa NOT DETECTED NOT DETECTED Final   Stenotrophomonas maltophilia NOT DETECTED NOT DETECTED Final   Candida albicans NOT DETECTED NOT DETECTED Final   Candida auris NOT DETECTED NOT  DETECTED Final   Candida glabrata NOT DETECTED NOT DETECTED Final   Candida krusei NOT DETECTED NOT DETECTED Final   Candida parapsilosis NOT DETECTED NOT DETECTED Final   Candida tropicalis NOT DETECTED NOT DETECTED Final   Cryptococcus neoformans/gattii NOT DETECTED NOT DETECTED Final   CTX-M ESBL NOT DETECTED NOT DETECTED Final   Carbapenem resistance IMP NOT DETECTED NOT DETECTED Final   Carbapenem resistance KPC NOT DETECTED NOT DETECTED Final   Carbapenem resistance NDM NOT DETECTED NOT DETECTED Final   Carbapenem resist OXA 48 LIKE NOT DETECTED NOT DETECTED Final   Carbapenem resistance VIM NOT DETECTED NOT DETECTED Final    Comment: Performed at [facility], [address] [place], [place] [address]  Blood culture (routine x 2)     Status: None   Collection Time: 10/19/23 11:45 AM   Specimen: BLOOD  Result Value Ref Range Status   Specimen Description BLOOD LEFT ANTECUBITAL  Final   Special Requests   Final    BOTTLES DRAWN  AEROBIC AND ANAEROBIC Blood Culture adequate volume   Culture   Final    NO GROWTH 5 DAYS Performed at [facility], [address] [place], [place] [address]    Report Status 10/24/2023 FINAL  Final  Group A Strep by PCR     Status: None   Collection Time: 10/19/23  4:15 PM   Specimen: Throat; Sterile Swab  Result Value Ref Range Status   Group A Strep by PCR NOT DETECTED NOT DETECTED Final    Comment: Performed at [facility], [address] [place], [place] [address]  MRSA Next Gen by PCR, Nasal     Status: Abnormal   Collection Time: 10/21/23  6:15 PM   Specimen: Nasal Mucosa; Nasal Swab  Result Value Ref Range Status   MRSA by PCR Next Gen DETECTED (A) NOT DETECTED Final    Comment: RESULT CALLED TO, READ BACK BY AND VERIFIED WITH: YASMINE SORIANO @1935  ON 10/21/23 SKL (NOTE) The GeneXpert MRSA Assay (FDA approved for NASAL specimens only), is one component of a comprehensive MRSA colonization  surveillance program. It is not intended to diagnose MRSA infection nor to guide or monitor treatment for MRSA infections. Test performance is not FDA approved in patients less than 39 years old. Performed at [facility], [address] [place], [place] [address]   Culture, blood (Routine X 2) w Reflex to ID Panel     Status: None   Collection Time: 10/23/23  3:10 PM   Specimen: BLOOD  Result Value Ref Range Status   Specimen Description BLOOD BLOOD RIGHT HAND  Final   Special Requests   Final    BOTTLES DRAWN AEROBIC AND ANAEROBIC Blood Culture adequate volume   Culture   Final    NO GROWTH 5 DAYS Performed at [facility], [address] [place], [place] [address]    Report Status 10/28/2023 FINAL  Final  Culture, blood (Routine X 2) w Reflex to ID Panel     Status: None   Collection Time: 10/23/23  3:17 PM   Specimen: BLOOD  Result Value Ref Range Status   Specimen Description BLOOD BLOOD LEFT HAND  Final   Special Requests   Final    BOTTLES DRAWN AEROBIC AND ANAEROBIC Blood Culture results may not be optimal due to an excessive volume of blood received in culture bottles   Culture   Final    NO GROWTH 5 DAYS Performed at [facility], [address] [place], [place] [address]    Report Status 10/28/2023 FINAL  Final    Coagulation Studies: No results for input(s): "LABPROT", "INR" in the last 72 hours.  Urinalysis: Recent Labs    11/16/23 0805  COLORURINE YELLOW*  LABSPEC 1.023  PHURINE 5.0  GLUCOSEU NEGATIVE  HGBUR MODERATE*  BILIRUBINUR NEGATIVE  KETONESUR 5*  PROTEINUR >=300*  NITRITE NEGATIVE  LEUKOCYTESUR MODERATE*      Imaging: DG Chest Port 1 View  Result Date: 11/16/2023 CLINICAL DATA:  Questionable sepsis - evaluate for abnormality EXAM: PORTABLE CHEST 1 VIEW COMPARISON:  None Available. FINDINGS: Tunneled hemodialysis catheter with tip terminating in the right atrium. Low lung volumes. The cardiomediastinal  silhouette is within normal limits. No pleural effusion. No pneumothorax. No mass or consolidation. No acute osseous abnormality. IMPRESSION: No acute findings in the chest. Electronically Signed   By: Olive Bass M.D.   On: 11/16/2023 13:13     Medications:    [START ON 11/17/2023] ceFEPime (MAXIPIME) IV  vancomycin      [START ON 11/17/2023] allopurinol  50 mg Oral Daily   [START ON 11/17/2023] amiodarone  200 mg Oral Daily   apixaban  2.5 mg Oral BID   midodrine  10 mg Oral TID WC   vancomycin variable dose per unstable renal function (pharmacist dosing)   Does not apply See admin instructions   acetaminophen, morphine injection, ondansetron **OR** ondansetron (ZOFRAN) IV  Assessment/ Plan:  [name] is a 89 y.o.  female with past medical history including chronic heart failure, A-fib with recent cardioversion on Xarelto, CAD, and chronic kidney disease stage IV-5, who was admitted to [facility] on 11/16/2023 for Sepsis (HCC) [A41.9]   End stage renal disease requiring hemodialysis.  Patient recently started on outpatient hemodialysis treatments.  She has had some issues with hypotension during treatments but otherwise has been tolerating relatively well.  However given her overall picture the family wishes to hold off on additional dialysis treatments.  This was confirmed with the patient today.  Therefore we will hold any further dialysis treatments until palliative care has had a chance to see the patient and discussed goals of care with the patient and her family.  Lab Results  Component Value Date   CREATININE 3.16 (H) 11/16/2023   CREATININE 3.02 (H) 10/27/2023   CREATININE 2.38 (H) 10/26/2023    Intake/Output Summary (Last 24 hours) at 11/16/2023 1913 Last data filed at 11/16/2023 1600 Gross per 24 hour  Intake 1540 ml  Output --  Net 1540 ml   2.  Acute on chronic combined heart failure.  Last echo 10/24 with EF of 40 to 45% and grade 1 diastolic  dysfunction.  Hold off on additional volume removal.   3. Diabetes mellitus type II with chronic kidney disease/renal manifestations: Glycemic control per hospitalist.  4.  Left kidney renal mass, seen on MRI abdomen with/without contrast measuring 1.7 to 1.4 cm, consistent with small renal cell carcinoma.  No additional workup necessary.  5. Anemia of chronic kidney disease  Lab Results  Component Value Date   HGB 10.7 (L) 11/16/2023    Hgb at goal. No need for ESA at this time   LOS: 0 Nader Boys 11/19/20247:13 PM

## 2023-11-16 NOTE — Assessment & Plan Note (Signed)
Meeting severe sepsis criteria with temp 100.9, heart rate 100s White count 14.8 Lactate 2.9 Systolic pressures 80s to 100s Concern for urinary source as well as recurrent lower extremity cellulitis Urinalysis indicative of infection IV cefepime and vancomycin for infectious coverage Blood and urine cultures Trend lactate Minimal IV fluids in the setting of ESRD

## 2023-11-16 NOTE — Assessment & Plan Note (Signed)
Blood sugar in 200s Lantus 8 units SSI Monitor

## 2023-11-16 NOTE — H&P (Addendum)
History and Physical    Patient: Tiffany [name] ZDG:[id number] DOB: February 26, 1933 DOA: 11/16/2023 DOS: the patient was seen and examined on 11/16/2023 PCP: Lynnea Ferrier, MD  Patient coming from: SNF  Chief Complaint:  Chief Complaint  Patient presents with   Sepsis alert   Altered Mental Status   Cellulitis   HPI: Tiffany [name] is a 89 y.o. female with medical history significant of chronic HFrEF with LVEF 30-35%, CAD, CKD stage IV, PAF status post recent cardioversion on Xarelto, ureteral stone status post stenting April 2024, chronic back pain, IDDM, obesity presenting with sepsis, encephalopathy, UTI, cellulitis.  Per report, patient with worsening confusion and lethargy at local skilled nursing facility.  History primarily from patient's husband Dr. Nadine Counts who is a retired hospitalist/internist.  Noted have been recently admitted October 22 through October 31 for issues including CHF with concern for cardiorenal syndrome, recurrent right leg cellulitis with bacteremia.  Has had otherwise stable course from discharge.  On Monday Wednesday Friday schedule for dialysis.  No reported missed sessions.  No reported fevers or chills.  Right lower extremity cellulitis has relatively improved though with chronic venous stasis changes. Presented to ER Tmax 100.9, heart rate 100s, respirations 20s, blood pressure 80s to 100s over 40s to 60s.  Initially in the mid 80s on room air, transition to 3 L nasal cannula.  White count 14.8, hemoglobin 10.7, platelets 186, creatinine 3.16, glucose 229, lactate 2.9-2.3.  Urinalysis indicative of infection.  Chest x-ray within normal limits. Review of Systems: As mentioned in the history of present illness. All other systems reviewed and are negative. Past Medical History:  Diagnosis Date   Anemia    Aortic atherosclerosis (HCC)    Arthritis    CAD (coronary artery disease) 02/27/2015   a.) MPI 02/27/2015: EF 62%, small/mild area of septal hypoperfusion  with borderline partial redistribution; equivocal study; b.) LHC 03/13/2015: LVEDP 16 mmHg; 10% mLAD, 10% RCA --> no intervention required (med mgmt); c.) MPI 03/16/2019: EF 56%; normal study.   Cataracts, both eyes    Cervical disc disease    a.) s/p cervical fusion   Chickenpox    CKD (chronic kidney disease), stage IV (HCC)    Complication of anesthesia    a.) remote h/o PONV; b.) difficult intubation related to remote cervical fusion   Difficult intubation    a.) secondary to remote cervical fusion; reports severe pain s/p being intubated   Dyspnea on exertion    HFrEF (heart failure with reduced ejection fraction) (HCC)    a.) TTE 01/11/2014: EF >55%, mod LVH, MAC, mild RAE, mild MR/TR, sev PR; b.) TTE 11/14/2018: EF 40%, sep and inf HK, mild LAE, triv P, mod MR/TR, G2DD; c.) TTE 11/15/2020: EF 45%, sep HK, mild LVH, triv AR/PR, mild MR/TR, d.) TTE 10/28/2021: EF 45%, mild glob HK, mild LVH, triv AR/PR, mild MR, mod TR; e.) TTE 11/06/2021: EF 30-35%, mid apical and anteroseptal HK, mild TR, mod MR, G2DD.   History of recurrent UTI (urinary tract infection)    Hyperlipidemia    Hypertension    Incontinence in female    wears pads   LBBB (left bundle branch block)    Long term (current) use of anticoagulants    a.) rivaroxaban   Long-term current use of opiate analgesic    a.) hydrocodone/APAP (Norco)   Lumbar disc disease    a.) s/p lumbar interbody fusion L2-L5   PAF (paroxysmal atrial fibrillation) (HCC)    a.) CHA2DS2VASc =  8 (age x2, sex, HFrEF, HTN, vascular diasease, T2DM); b.) s/p DCCV 01/12/2017 (150 J x 1) and 11/04/2021 (120 J x 1); c.) rate/rhythm maintained on oral amiodarone + metoprolol succinate; chronically anticoagulated with rivaroxaban   PONV (postoperative nausea and vomiting)    with 1st pregnancy 50 years ago and no problem since then   Right leg weakness    Sciatica    Spinal stenosis of lumbar region    Type 2 diabetes mellitus treated with insulin (HCC)     Ventral hernia    Past Surgical History:  Procedure Laterality Date   CARDIOVERSION N/A 11/04/2021   Procedure: CARDIOVERSION;  Surgeon: Dalia Heading, MD;  Location: [facility];  Service: Cardiovascular;  Laterality: N/A;   CARDIOVERSION N/A 03/17/2023   Procedure: CARDIOVERSION;  Surgeon: Marcina Millard, MD;  Location: [facility];  Service: Cardiovascular;  Laterality: N/A;   CATARACT EXTRACTION, BILATERAL     CERVICAL LAMINECTOMY  1996   CYSTOSCOPY W/ RETROGRADES  12/06/2020   Procedure: CYSTOSCOPY WITH RETROGRADE PYELOGRAM;  Surgeon: Sondra Come, MD;  Location: [facility];  Service: Urology;;   CYSTOSCOPY W/ URETERAL STENT PLACEMENT Left 04/10/2022   Procedure: CYSTOSCOPY WITH STENT REPLACEMENT;  Surgeon: Sondra Come, MD;  Location: [facility];  Service: Urology;  Laterality: Left;   CYSTOSCOPY W/ URETERAL STENT PLACEMENT Left 03/12/2023   Procedure: CYSTOSCOPY WITH STENT EXCHANGE;  Surgeon: Sondra Come, MD;  Location: [facility];  Service: Urology;  Laterality: Left;   CYSTOSCOPY WITH STENT PLACEMENT Left 12/06/2020   Procedure: CYSTOSCOPY WITH STENT PLACEMENT;  Surgeon: Sondra Come, MD;  Location: [facility];  Service: Urology;  Laterality: Left;   DIALYSIS/PERMA CATHETER INSERTION Right 10/22/2023   Procedure: DIALYSIS/PERMA CATHETER INSERTION;  Surgeon: Renford Dills, MD;  Location: [facility];  Service: Cardiovascular;  Laterality: Right;   ELECTROPHYSIOLOGIC STUDY N/A 01/12/2017   Procedure: Cardioversion;  Surgeon: Dalia Heading, MD;  Location: [facility];  Service: Cardiovascular;  Laterality: N/A;   INSERTION OF MESH N/A 08/27/2022   Procedure: INSERTION OF MESH;  Surgeon: Sung Amabile, DO;  Location: [facility];  Service: General;  Laterality: N/A;   JOINT REPLACEMENT     LATERAL FUSION LUMBAR SPINE, TRANSVERSE  01/2010   L2-L5   LEFT HEART CATH AND CORONARY ANGIOGRAPHY Left 03/13/2015   Procedure: LEFT HEART CATH AND CORONARY ANGIOGRAPHY;  Location: [facility]; Surgeon: Harold Hedge, MD   ROTATOR CUFF REPAIR Right 2010   TONSILLECTOMY     TOTAL KNEE ARTHROPLASTY Left 05/23/2019   Procedure: TOTAL KNEE ARTHROPLASTY - LEFT - DIABETIC;  Surgeon: Christena Flake, MD;  Location: [facility];  Service: Orthopedics;  Laterality: Left;   URETEROSCOPY N/A 12/06/2020   Procedure: DIAGNOSTIC URETEROSCOPY;  Surgeon: Sondra Come, MD;  Location: [facility];  Service: Urology;  Laterality: N/A;   XI ROBOTIC ASSISTED VENTRAL HERNIA N/A 08/27/2022   Procedure: XI ROBOTIC ASSISTED VENTRAL HERNIA;  Surgeon: Sung Amabile, DO;  Location: [facility];  Service: General;  Laterality: N/A;   Social History:  reports that she has never smoked. She has never been exposed to tobacco smoke. She has never used smokeless tobacco. She reports current alcohol use of about 1.0 standard drink of alcohol per week. She reports that she does not use drugs.  Allergies  Allergen Reactions   Amlodipine Swelling   Codeine Nausea And Vomiting    Tolerates low doses hydrocodone   Sacubitril-Valsartan Other (See Comments)    Acute kidney injury  Tizanidine     Didn't feel good taking it   Doxycycline Nausea And Vomiting    Family History  Adopted: Yes    Prior to Admission medications   Medication Sig Start Date End Date Taking? Authorizing Provider  acetaminophen (TYLENOL) 500 MG tablet Take 1 tablet (500 mg total) by mouth every 8 (eight) hours as needed for mild pain (pain score 1-3). Patient taking differently: Take 500 mg by mouth every 6 (six) hours as needed for mild pain (pain score 1-3). 10/28/23  Yes Sunnie Nielsen, DO  allopurinol (ZYLOPRIM) 100 MG tablet Take 0.5 tablets (50 mg total) by mouth daily. 11/04/23  Yes Sharon Seller, NP  amiodarone (PACERONE) 200 MG tablet Take 200 mg by mouth daily.   Yes [provider]  apixaban (ELIQUIS) 2.5 MG TABS tablet Take 1 tablet (2.5 mg total) by mouth 2 (two) times daily. 11/04/23  Yes Sharon Seller, NP  cephALEXin (KEFLEX) 500 MG capsule Take 500 mg by mouth 3 (three) times daily. 11/11/23 11/18/23 Yes [provider]  HYDROcodone-acetaminophen (NORCO/VICODIN) 5-325 MG tablet Take 1 tablet by mouth every 6 (six) hours as needed for moderate pain (pain score 4-6).   Yes [provider]  insulin aspart (NOVOLOG) 100 UNIT/ML injection Inject 0-20 Units into the skin 3 (three) times daily with meals. CBG < 70: administer glucose. CBG 70 - 120: 0 units; CBG 121 - 150: 4 units; CBG 151 - 200: 5 units; CBG 201 - 250: 7 units; CBG 251 - 300: 11 units; CBG 301 - 350: 15 units; CBG 351 - 400: 20 units; CBG > 400: call MD Patient taking differently: Inject 0-20 Units into the skin 2 (two) times daily. CBG < 70: administer glucose. CBG 70 - 120: 0 units; CBG 121 - 150: 4 units; CBG 151 - 200: 5 units; CBG 201 - 250: 7 units; CBG 251 - 300: 11 units; CBG 301 - 350: 15 units; CBG 351 - 400: 20 units; CBG > 400: call MD 10/28/23  Yes Sunnie Nielsen, DO  insulin glargine-yfgn (SEMGLEE) 100 UNIT/ML injection Inject 0.08 mLs (8 Units total) into the skin daily. Patient taking differently: Inject 10 Units into the skin daily. 10/28/23  Yes Sunnie Nielsen, DO  linagliptin (TRADJENTA) 5 MG TABS tablet Take 1 tablet (5 mg total) by mouth daily. 10/28/23  Yes Sunnie Nielsen, DO  metoprolol succinate (TOPROL-XL) 25 MG 24 hr tablet Take 0.5 tablets (12.5 mg total) by mouth 2 (two) times daily. 10/28/23  Yes Sunnie Nielsen, DO  midodrine (PROAMATINE) 10 MG tablet Take 10 mg by mouth daily. 11/08/23  Yes [provider]  multivitamin (RENA-VIT) TABS tablet Take 1 tablet by mouth at bedtime. 10/28/23  Yes Sunnie Nielsen, DO  ondansetron (ZOFRAN) 4 MG tablet Take 4 mg by mouth every 8 (eight) hours as needed for nausea or vomiting.   Yes [provider]  PROAIR RESPICLICK 108 (90 Base) MCG/ACT AEPB Inhale 2 puffs into the lungs 4 (four) times daily as needed. 10/06/23   Yes [provider]  torsemide (DEMADEX) 20 MG tablet Take 1 tablet (20 mg total) by mouth every Tuesday, Thursday, and Saturday at 6 PM. 10/28/23  Yes Sunnie Nielsen, DO  Vitamin D, Ergocalciferol, (DRISDOL) 1.25 MG (50000 UNIT) CAPS capsule Take 50,000 Units by mouth once a week. 11/09/23  Yes [provider]  glucose blood (PRECISION QID TEST) test strip Use 2 (two) times daily Use as instructed. 05/03/20   [provider]  insulin aspart (NOVOLOG) 100 UNIT/ML injection Inject 0-5 Units into the skin at bedtime. Patient not taking: Reported on 10/29/2023 10/28/23   Sunnie Nielsen, DO  Insulin Pen Needle (BD PEN NEEDLE NANO U/F) 32G X 4 MM MISC USE WITH INSULIN PEN TWICE DAILY OR AS DIRECTED 10/18/19   [provider]  Nutritional Supplements (,FEEDING SUPPLEMENT, PROSOURCE PLUS) liquid Take 30 mLs by mouth 3 (three) times daily between meals. 10/28/23   Sunnie Nielsen, DO    Physical Exam: Vitals:   11/16/23 1400 11/16/23 1518 11/16/23 1524 11/16/23 1530  BP:   (!) 112/40 (!) 84/65  Pulse: 94  99 100  Resp: 20  (!) 27 (!) 29  Temp:  99.3 F (37.4 C)    TempSrc:  Oral    SpO2: 100%  100% 97%  Weight:      Height:       Physical Exam Constitutional:      Appearance: She is obese.     Comments: + generalized lethargy.   HENT:     Head: Normocephalic and atraumatic.     Nose: Nose normal.     Mouth/Throat:     Mouth: Mucous membranes are moist.  Eyes:     Pupils: Pupils are equal, round, and reactive to light.  Cardiovascular:     Rate and Rhythm: Normal rate and regular rhythm.  Pulmonary:     Effort: Pulmonary effort is normal.  Abdominal:     General: Bowel sounds are normal.  Skin:    Comments: See picture  + TTP on RLE   Neurological:     General: No focal deficit present.  Psychiatric:        Mood and Affect: Mood normal.     Data Reviewed:  There are no new results to review at this time.  DG Chest Port 1  View CLINICAL DATA:  Questionable sepsis - evaluate for abnormality  EXAM: PORTABLE CHEST 1 VIEW  COMPARISON:  None Available.  FINDINGS: Tunneled hemodialysis catheter with tip terminating in the right atrium. Low lung volumes. The cardiomediastinal silhouette is within normal limits. No pleural effusion. No pneumothorax. No mass or consolidation. No acute osseous abnormality.  IMPRESSION: No acute findings in the chest.  Electronically Signed   By: Olive Bass M.D.   On: 11/16/2023 13:13  Lab Results  Component Value Date   WBC 14.8 (H) 11/16/2023   HGB 10.7 (L) 11/16/2023   HCT 34.4 (L) 11/16/2023   MCV 88.0 11/16/2023   PLT 186 11/16/2023   Last metabolic panel Lab Results  Component Value Date   GLUCOSE 229 (H) 11/16/2023   NA 130 (L) 11/16/2023   K 3.4 (L) 11/16/2023   CL 89 (L) 11/16/2023   CO2 26 11/16/2023   BUN 29 (H) 11/16/2023   CREATININE 3.16 (H) 11/16/2023   GFRNONAA 14 (L) 11/16/2023   CALCIUM 8.3 (L) 11/16/2023   PHOS 4.2 11/06/2021   PROT 5.9 (L) 11/16/2023   ALBUMIN 2.8 (L) 11/16/2023   BILITOT 1.0 11/16/2023   ALKPHOS 81 11/16/2023   AST 38 11/16/2023   ALT 28 11/16/2023   ANIONGAP 15 11/16/2023    Assessment and Plan: * Sepsis (HCC) Meeting severe sepsis criteria with temp 100.9, heart rate 100s White count 14.8 Lactate 2.9 Systolic pressures 80s to 100s Concern for urinary source as well as recurrent lower extremity cellulitis Urinalysis indicative of infection IV cefepime and vancomycin for infectious coverage Blood and urine cultures Trend lactate Minimal IV fluids in the  setting of ESRD  Cellulitis Noted recurrently lower extremity cellulitis with recent admission October 22 through October 31 for right-sided lower extremity infection Had blood cultures during prior admission growing Klebsiella and Enterobacteralis- s/p course of oral abx 10/30/23 Worsening left lower extremity redness swelling and tenderness to  palpation Will place on IV cefepime and vancomycin for infectious coverage Blood cultures drawn  LE u/s to r/o DVT  Wound care consult  Monitor     HFrEF (heart failure with reduced ejection fraction) (HCC) 2D ECHO 09/2023 w/ EF 40-45% and grade 1 diastolic dysfunction  Concern for cardiorenal syndrome in setting of ESRD  Dialysis dependent volume status  Appears fairly euvolemic though w/  chronic LE swelling and overlapping LE cellulitis  Hypotensive in setting of sepsis  Hold antihypertensives and diuretics  Gentle IVF hydration in setting of ESRD    UTI (urinary tract infection) Urinalysis indicative of infection in the setting of sepsis IV cefepime per sepsis protocol with concomitant cellulitis Urine culture Monitor  ESRD (end stage renal disease) (HCC) Baseline ESRD on HD MWF with concern for cardiorenal syndrome  No reported missed HD sessions  Cr 3.3 today  Getting gentle IVF in setting of sepsis-follow closely Per Dr. Fanny Bien, Dr Cherylann Ratel w/ nephrology aware of patient  Follow up nephrology recommendations    Encephalopathy Positive generalized lethargy on presentation Suspect toxic metabolic encephalopathy in setting of concurrent sepsis, UTI, lower extremity cellulitis Unclear general baseline Grossly nonfocal neuro exam Check ammonia level x 1 CT head x 1 Treatment per sepsis protocol and infectious etiologies Monitor   Insulin dependent type 2 diabetes mellitus (HCC) Blood sugar in 200s Lantus 8 units SSI Monitor  Hypertensive heart and chronic kidney disease with heart failure and stage 1 through stage 4 chronic kidney disease, or unspecified chronic kidney disease (HCC) SBPs 80s-100s in setting of sepsis  Hold antihypertensive regimen  Resume midodrine  Monitor    Atrial fibrillation, chronic (HCC) HR into 100s  Cont eliquis and amiodarone Hold BB in setting of hypotension      Advance Care Planning:   Code Status: Limited: Do not attempt  resuscitation (DNR) -DNR-LIMITED -Do Not Intubate/DNI     Greater than 50% was spent in counseling, critical care evaluation and coordination of care with patient Total encounter time 80 minutes or more  Consults: Nephrology- Lateef   Family Communication: Family at the bedside   Severity of Illness: The appropriate patient status for this patient is INPATIENT. Inpatient status is judged to be reasonable and necessary in order to provide the required intensity of service to ensure the patient's safety. The patient's presenting symptoms, physical exam findings, and initial radiographic and laboratory data in the context of their chronic comorbidities is felt to place them at high risk for further clinical deterioration. Furthermore, it is not anticipated that the patient will be medically stable for discharge from the hospital within 2 midnights of admission.   * I certify that at the point of admission it is my clinical judgment that the patient will require inpatient hospital care spanning beyond 2 midnights from the point of admission due to high intensity of service, high risk for further deterioration and high frequency of surveillance required.*  Author: Floydene Flock, MD 11/16/2023 4:44 PM  For on call review [email or web address].

## 2023-11-16 NOTE — Assessment & Plan Note (Addendum)
SBPs 80s-100s in setting of sepsis  Hold antihypertensive regimen  Resume midodrine  Monitor

## 2023-11-16 NOTE — Assessment & Plan Note (Addendum)
2D ECHO 09/2023 w/ EF 40-45% and grade 1 diastolic dysfunction  Concern for cardiorenal syndrome in setting of ESRD  Dialysis dependent volume status  Appears fairly euvolemic though w/  chronic LE swelling and overlapping LE cellulitis  Hypotensive in setting of sepsis  Hold antihypertensives and diuretics  Gentle IVF hydration in setting of ESRD

## 2023-11-16 NOTE — Assessment & Plan Note (Addendum)
Baseline ESRD on HD MWF with concern for cardiorenal syndrome  No reported missed HD sessions  Cr 3.3 today  Getting gentle IVF in setting of sepsis-follow closely Per Dr. Fanny Bien, Dr Cherylann Ratel w/ nephrology aware of patient  Follow up nephrology recommendations

## 2023-11-16 NOTE — Progress Notes (Signed)
Had a relatively lengthy discussion with family at the bedside Family present including patient's husband Dr. Annette Stable who is an internist as well as son and daughter-in-law Family is considering comfort measures as well as discontinuing dialysis I broached a palliative care evaluation which family is amenable to Family is currently agreeable to plan for IV antibiotics overnight, medication for pain and agitation with tentative plan for comfort measures only with formal palliative care evaluation in the morning Will downgrade patient to a regular floor bed Family expressed agreement to plan Follow

## 2023-11-16 NOTE — Assessment & Plan Note (Signed)
Positive generalized lethargy on presentation Suspect toxic metabolic encephalopathy in setting of concurrent sepsis, UTI, lower extremity cellulitis Unclear general baseline Grossly nonfocal neuro exam Check ammonia level x 1 CT head x 1 Treatment per sepsis protocol and infectious etiologies Monitor

## 2023-11-16 NOTE — Plan of Care (Signed)
  Problem: Respiratory: Goal: Ability to maintain adequate ventilation will improve Outcome: Progressing   Problem: Metabolic: Goal: Ability to maintain appropriate glucose levels will improve Outcome: Progressing   Problem: Fluid Volume: Goal: Hemodynamic stability will improve Outcome: Progressing

## 2023-11-16 NOTE — Assessment & Plan Note (Signed)
HR into 100s  Cont eliquis and amiodarone Hold BB in setting of hypotension

## 2023-11-16 NOTE — Progress Notes (Addendum)
Deescalating care per MD and family. BP to be obtain once per shift per MD. Family refuses invasive measures.

## 2023-11-16 NOTE — ED Notes (Signed)
Unable to get second cultures yet. Will bring u/s machine for u/s IV to get second set cultures.

## 2023-11-16 NOTE — Consult Note (Addendum)
Pharmacy Antibiotic Note  Tiffany [name] is a 89 y.o. female admitted on 11/16/2023 with cellulitis. Patient recently filled a 7 day supply of Keflex on 11/14. Of note, patient recently started dialysis three weeks ago. Pharmacy has been consulted for vancomycin and cefepime dosing.  Today, 11/16/2023:  WBC 14.8 Lactate 2.3 Afebrile  Scr 3.16 with estimated CrCl 12.8 mL/min  Blood and urine cultures sent   Plan: Received one time doses of cefepime 2 gm and vancomycin 1 gm in ED Will order an additional vancomycin 1 gm IV dose now to complete a full loading dose  Patient recently started dialysis so will await dialysis plans for further vancomycin dosing  Start cefepime 2 gm IV Q24H based on renal function  Pharmacy will continue to monitor and dose adjust appropriately   Height: 5\' 2"  (157.5 cm) Weight: 89.8 kg (198 lb) IBW/kg (Calculated) : 50.1  Temp (24hrs), Avg:99.4 F (37.4 C), Min:98.1 F (36.7 C), Max:100.9 F (38.3 C)  Recent Labs  Lab 11/16/23 0805 11/16/23 0955  WBC 14.8*  --   CREATININE 3.16*  --   LATICACIDVEN 2.9* 2.3*    Estimated Creatinine Clearance: 12.8 mL/min (A) (by C-G formula based on SCr of 3.16 mg/dL (H)).    Allergies  Allergen Reactions   Amlodipine Swelling   Codeine Nausea And Vomiting    Tolerates low doses hydrocodone   Sacubitril-Valsartan Other (See Comments)    Acute kidney injury   Tizanidine     Didn't feel good taking it   Doxycycline Nausea And Vomiting   Antimicrobials this admission: Vancomycin 11/19 >> Cefepime 11/19 >>   Dose adjustments this admission:  Microbiology results: 11/19 BCx: sent  11/19 UCx: sent   Thank you for allowing pharmacy to be a part of this patient's care.  Littie Deeds, PharmD Pharmacy Resident  11/16/2023 4:22 PM

## 2023-11-17 DIAGNOSIS — C642 Malignant neoplasm of left kidney, except renal pelvis: Secondary | ICD-10-CM | POA: Diagnosis not present

## 2023-11-17 DIAGNOSIS — D630 Anemia in neoplastic disease: Secondary | ICD-10-CM | POA: Diagnosis not present

## 2023-11-17 DIAGNOSIS — Z7189 Other specified counseling: Secondary | ICD-10-CM | POA: Diagnosis not present

## 2023-11-17 DIAGNOSIS — E1122 Type 2 diabetes mellitus with diabetic chronic kidney disease: Secondary | ICD-10-CM | POA: Diagnosis not present

## 2023-11-17 DIAGNOSIS — I132 Hypertensive heart and chronic kidney disease with heart failure and with stage 5 chronic kidney disease, or end stage renal disease: Secondary | ICD-10-CM | POA: Diagnosis not present

## 2023-11-17 DIAGNOSIS — A419 Sepsis, unspecified organism: Secondary | ICD-10-CM | POA: Diagnosis not present

## 2023-11-17 DIAGNOSIS — N186 End stage renal disease: Secondary | ICD-10-CM | POA: Diagnosis not present

## 2023-11-17 DIAGNOSIS — I5023 Acute on chronic systolic (congestive) heart failure: Secondary | ICD-10-CM | POA: Diagnosis not present

## 2023-11-17 DIAGNOSIS — D631 Anemia in chronic kidney disease: Secondary | ICD-10-CM | POA: Diagnosis not present

## 2023-11-17 LAB — BLOOD CULTURE ID PANEL (REFLEXED) - BCID2
A.calcoaceticus-baumannii: NOT DETECTED
A.calcoaceticus-baumannii: NOT DETECTED
Bacteroides fragilis: NOT DETECTED
Bacteroides fragilis: NOT DETECTED
CTX-M ESBL: NOT DETECTED
Candida albicans: NOT DETECTED
Candida albicans: NOT DETECTED
Candida auris: NOT DETECTED
Candida auris: NOT DETECTED
Candida glabrata: NOT DETECTED
Candida glabrata: NOT DETECTED
Candida krusei: NOT DETECTED
Candida krusei: NOT DETECTED
Candida parapsilosis: NOT DETECTED
Candida parapsilosis: NOT DETECTED
Candida tropicalis: NOT DETECTED
Candida tropicalis: NOT DETECTED
Carbapenem resistance IMP: NOT DETECTED
Carbapenem resistance KPC: NOT DETECTED
Carbapenem resistance NDM: NOT DETECTED
Carbapenem resistance VIM: NOT DETECTED
Cryptococcus neoformans/gattii: NOT DETECTED
Cryptococcus neoformans/gattii: NOT DETECTED
Enterobacter cloacae complex: NOT DETECTED
Enterobacter cloacae complex: NOT DETECTED
Enterobacterales: NOT DETECTED
Enterobacterales: NOT DETECTED
Enterococcus Faecium: NOT DETECTED
Enterococcus Faecium: NOT DETECTED
Enterococcus faecalis: NOT DETECTED
Enterococcus faecalis: NOT DETECTED
Escherichia coli: NOT DETECTED
Escherichia coli: NOT DETECTED
Haemophilus influenzae: NOT DETECTED
Haemophilus influenzae: NOT DETECTED
Klebsiella aerogenes: NOT DETECTED
Klebsiella aerogenes: NOT DETECTED
Klebsiella oxytoca: NOT DETECTED
Klebsiella oxytoca: NOT DETECTED
Klebsiella pneumoniae: NOT DETECTED
Klebsiella pneumoniae: NOT DETECTED
Listeria monocytogenes: NOT DETECTED
Listeria monocytogenes: NOT DETECTED
Methicillin resistance mecA/C: DETECTED — AB
Neisseria meningitidis: NOT DETECTED
Neisseria meningitidis: NOT DETECTED
Proteus species: NOT DETECTED
Proteus species: NOT DETECTED
Pseudomonas aeruginosa: DETECTED — AB
Pseudomonas aeruginosa: NOT DETECTED
Salmonella species: NOT DETECTED
Salmonella species: NOT DETECTED
Serratia marcescens: NOT DETECTED
Serratia marcescens: NOT DETECTED
Staphylococcus aureus (BCID): NOT DETECTED
Staphylococcus aureus (BCID): NOT DETECTED
Staphylococcus epidermidis: NOT DETECTED
Staphylococcus epidermidis: DETECTED — AB
Staphylococcus lugdunensis: NOT DETECTED
Staphylococcus lugdunensis: NOT DETECTED
Staphylococcus species: NOT DETECTED
Staphylococcus species: DETECTED — AB
Stenotrophomonas maltophilia: NOT DETECTED
Stenotrophomonas maltophilia: NOT DETECTED
Streptococcus agalactiae: NOT DETECTED
Streptococcus agalactiae: NOT DETECTED
Streptococcus pneumoniae: NOT DETECTED
Streptococcus pneumoniae: NOT DETECTED
Streptococcus pyogenes: NOT DETECTED
Streptococcus pyogenes: NOT DETECTED
Streptococcus species: NOT DETECTED
Streptococcus species: NOT DETECTED

## 2023-11-17 LAB — CBC
HCT: 26.2 % — ABNORMAL LOW (ref 36.0–46.0)
Hemoglobin: 8.5 g/dL — ABNORMAL LOW (ref 12.0–15.0)
MCH: 27.8 pg (ref 26.0–34.0)
MCHC: 32.4 g/dL (ref 30.0–36.0)
MCV: 85.6 fL (ref 80.0–100.0)
Platelets: 138 10*3/uL — ABNORMAL LOW (ref 150–400)
RBC: 3.06 MIL/uL — ABNORMAL LOW (ref 3.87–5.11)
RDW: 16.7 % — ABNORMAL HIGH (ref 11.5–15.5)
WBC: 18.3 10*3/uL — ABNORMAL HIGH (ref 4.0–10.5)
nRBC: 0 % (ref 0.0–0.2)

## 2023-11-17 LAB — COMPREHENSIVE METABOLIC PANEL
ALT: 60 U/L — ABNORMAL HIGH (ref 0–44)
AST: 103 U/L — ABNORMAL HIGH (ref 15–41)
Albumin: 2 g/dL — ABNORMAL LOW (ref 3.5–5.0)
Alkaline Phosphatase: 56 U/L (ref 38–126)
Anion gap: 9 (ref 5–15)
BUN: 35 mg/dL — ABNORMAL HIGH (ref 8–23)
CO2: 25 mmol/L (ref 22–32)
Calcium: 8 mg/dL — ABNORMAL LOW (ref 8.9–10.3)
Chloride: 96 mmol/L — ABNORMAL LOW (ref 98–111)
Creatinine, Ser: 3.31 mg/dL — ABNORMAL HIGH (ref 0.44–1.00)
GFR, Estimated: 13 mL/min — ABNORMAL LOW (ref >60)
Glucose, Bld: 124 mg/dL — ABNORMAL HIGH (ref 70–99)
Potassium: 3.1 mmol/L — ABNORMAL LOW (ref 3.5–5.1)
Sodium: 130 mmol/L — ABNORMAL LOW (ref 135–145)
Total Bilirubin: 1.1 mg/dL (ref <1.2)
Total Protein: 4.6 g/dL — ABNORMAL LOW (ref 6.5–8.1)

## 2023-11-17 LAB — IRON AND TIBC
Iron: 12 ug/dL — ABNORMAL LOW (ref 28–170)
Saturation Ratios: 5 % — ABNORMAL LOW (ref 10.4–31.8)
TIBC: 235 ug/dL — ABNORMAL LOW (ref 250–450)
UIBC: 223 ug/dL

## 2023-11-17 LAB — GLUCOSE, CAPILLARY
Glucose-Capillary: 59 mg/dL — ABNORMAL LOW (ref 70–99)
Glucose-Capillary: 73 mg/dL (ref 70–99)
Glucose-Capillary: 50 mg/dL — ABNORMAL LOW (ref 70–99)
Glucose-Capillary: 53 mg/dL — ABNORMAL LOW (ref 70–99)

## 2023-11-17 LAB — FOLATE: Folate: 16.5 ng/mL (ref >5.9)

## 2023-11-17 LAB — VITAMIN B12: Vitamin B-12: 588 pg/mL (ref 180–914)

## 2023-11-17 LAB — OSMOLALITY: Osmolality: 288 mosm/kg (ref 275–295)

## 2023-11-17 LAB — MAGNESIUM: Magnesium: 1.8 mg/dL (ref 1.7–2.4)

## 2023-11-17 LAB — VITAMIN D 25 HYDROXY (VIT D DEFICIENCY, FRACTURES): Vit D, 25-Hydroxy: 76.89 ng/mL (ref 30–100)

## 2023-11-17 LAB — PHOSPHORUS: Phosphorus: 4.2 mg/dL (ref 2.5–4.6)

## 2023-11-17 MED ORDER — GLYCOPYRROLATE 1 MG PO TABS: 1.0000 mg | ORAL_TABLET | ORAL | Status: DC | PRN

## 2023-11-17 MED ORDER — FENTANYL CITRATE PF 50 MCG/ML IJ SOSY
12.5000 ug | PREFILLED_SYRINGE | INTRAMUSCULAR | Status: DC | PRN
  Administered 2023-11-17: 12.5 ug via INTRAVENOUS
  Administered 2023-11-18: 25 ug via INTRAVENOUS
  Filled 2023-11-17 (×2): qty 1

## 2023-11-17 MED ORDER — ACETAMINOPHEN 325 MG PO TABS: 650.0000 mg | ORAL_TABLET | Freq: Four times a day (QID) | ORAL | Status: DC | PRN

## 2023-11-17 MED ORDER — DEXTROSE 50 % IV SOLN
INTRAVENOUS | Status: AC
  Administered 2023-11-17: 12.5 g via INTRAVENOUS
  Filled 2023-11-17: qty 50

## 2023-11-17 MED ORDER — SODIUM CHLORIDE 0.9 % IV SOLN: 1.0000 g | INTRAVENOUS | Status: DC

## 2023-11-17 MED ORDER — DEXTROSE 50 % IV SOLN
12.5000 g | INTRAVENOUS | Status: AC
Start: 2023-11-17 — End: 2023-11-17

## 2023-11-17 MED ORDER — POLYVINYL ALCOHOL 1.4 % OP SOLN: 1.0000 [drp] | Freq: Four times a day (QID) | OPHTHALMIC | Status: DC | PRN

## 2023-11-17 MED ORDER — ACETAMINOPHEN 650 MG RE SUPP
650.0000 mg | Freq: Four times a day (QID) | RECTAL | Status: DC | PRN
  Administered 2023-11-17: 650 mg via RECTAL
  Filled 2023-11-17: qty 1

## 2023-11-17 MED ORDER — OXYCODONE HCL 20 MG/ML PO CONC: 5.0000 mg | ORAL | Status: DC | PRN

## 2023-11-17 MED ORDER — HALOPERIDOL LACTATE 5 MG/ML IJ SOLN
0.5000 mg | INTRAMUSCULAR | Status: DC | PRN
  Administered 2023-11-18 (×2): 0.5 mg via INTRAVENOUS
  Filled 2023-11-17 (×2): qty 1

## 2023-11-17 MED ORDER — GLYCOPYRROLATE 0.2 MG/ML IJ SOLN: 0.2000 mg | INTRAMUSCULAR | Status: DC | PRN

## 2023-11-17 MED ORDER — SODIUM CHLORIDE 0.9 % IV SOLN
12.5000 mg | Freq: Four times a day (QID) | INTRAVENOUS | Status: DC | PRN
  Filled 2023-11-17: qty 0.5

## 2023-11-17 MED ORDER — DEXTROSE-SODIUM CHLORIDE 5-0.9 % IV SOLN: INTRAVENOUS | Status: DC

## 2023-11-17 MED ORDER — HALOPERIDOL LACTATE 2 MG/ML PO CONC: 0.5000 mg | ORAL | Status: DC | PRN

## 2023-11-17 MED ORDER — HALOPERIDOL 0.5 MG PO TABS: 0.5000 mg | ORAL_TABLET | ORAL | Status: DC | PRN

## 2023-11-17 MED ORDER — BIOTENE DRY MOUTH MT LIQD: 15.0000 mL | OROMUCOSAL | Status: DC | PRN

## 2023-11-17 NOTE — Consult Note (Signed)
Consultation Note Date: 11/17/2023   Patient Name: Tiffany [name]  DOB: 12-Nov-1933  MRN: [id number]  Age / Sex: 89 y.o., female  PCP: Lynnea Ferrier, MD Referring Physician: Gillis Santa, MD  Reason for Consultation: Establishing goals of care  HPI/Patient Profile: Per H&P Diksha Feo is a 89 y.o. female with medical history significant of chronic HFrEF with LVEF 30-35%, CAD, CKD stage IV, PAF status post recent cardioversion on Xarelto, ureteral stone status post stenting April 2024, chronic back pain, IDDM, obesity presenting with sepsis, encephalopathy, UTI, cellulitis.  Per report, patient with worsening confusion and lethargy at local skilled nursing facility.  History primarily from patient's husband Dr. Nadine Counts who is a retired hospitalist/internist.  Noted have been recently admitted October 22 through October 31 for issues including CHF with concern for cardiorenal syndrome, recurrent right leg cellulitis with bacteremia.  Has had otherwise stable course from discharge.  On Monday Wednesday Friday schedule for dialysis.  No reported missed sessions.  No reported fevers or chills.  Right lower extremity cellulitis has relatively improved though with chronic venous stasis changes. Presented to ER Tmax 100.9, heart rate 100s, respirations 20s, blood pressure 80s to 100s over 40s to 60s.  Initially in the mid 80s on room air, transition to 3 L nasal cannula.  White count 14.8, hemoglobin 10.7, platelets 186, creatinine 3.16, glucose 229, lactate 2.9-2.3.  Urinalysis indicative of infection.  Chest x-ray within normal limits.  Clinical Assessment and Goals of Care: Notes and labs reviewed from current admission and previous admissions.  In to see patient.  She is currently resting in bed with eyes closed and with her husband at bedside.  Her husband was an attending physician for [employer] prior to retirement.   Patient's daughter-in-law is also at bedside.  Husband discusses the patient's decline started around 4 years ago when patient had COVID.  He discusses that they live at [facility].  He discusses her previous surgeries and chronic pain.  He discusses her renal failure and her wounds.   We discussed her diagnoses, limited prognosis, GOC, EOL wishes disposition and options.  Created space and opportunity for patient  to explore thoughts and feelings regarding current medical information.   A detailed discussion was had today regarding advanced directives.  Concepts specific to code status, artifical feeding and hydration, IV antibiotics and rehospitalization were discussed.  The difference between an aggressive medical intervention path and a comfort care path was discussed.  Values and goals of care important to patient and family were attempted to be elicited.  Discussed limitations of medical interventions to prolong quality of life in some situations and discussed the concept of human mortality.  Patient's husband has been well updated and is able to articulate her status and options.  He states she has not been happy with doing dialysis and has desired to stop.  He states given all the other issues he would like to switch to comfort care.  Patient was able to confirm a desire to shift to  comfort care until death.  Patient complains of nausea and nursing and to provide medication.  I completed a MOST form today with the patient's husband Dr. Montez Morita after speaking with patient and daughter-in-law.  The signed original was placed in the chart. Each section of options on the form were reviewed in full detail and any questions were answered as needed. The form was scanned and sent to medical records for it to be uploaded under ACP tab in Epic. A photocopy was also placed in the chart to be scanned into EMR. The patient outlined their wishes for the following treatment decisions:  Cardiopulmonary  Resuscitation: Do Not Attempt Resuscitation (DNR/No CPR)  Medical Interventions: Comfort Measures: Keep clean, warm, and dry. Use medication by any route, positioning, wound care, and other measures to relieve pain and suffering. Use oxygen, suction and manual treatment of airway obstruction as needed for comfort. Do not transfer to the hospital unless comfort needs cannot be met in current location.  Antibiotics: No antibiotics (use other measures to relieve symptoms)  IV Fluids: No IV fluids (provide other measures to ensure comfort)  Feeding Tube: No feeding tube     SUMMARY OF RECOMMENDATIONS   Patient and family would like to switch to comfort care.  They would like to discuss hospice facility placement until death.   Prognosis:  < 2 weeks       Primary Diagnoses: Present on Admission:  Sepsis (HCC)  Hypertensive heart and chronic kidney disease with heart failure and stage 1 through stage 4 chronic kidney disease, or unspecified chronic kidney disease (HCC)  Atrial fibrillation, chronic (HCC)  Cellulitis  UTI (urinary tract infection)   I have reviewed the medical record, interviewed the patient and family, and examined the patient. The following aspects are pertinent.  Past Medical History:  Diagnosis Date   Anemia    Aortic atherosclerosis (HCC)    Arthritis    CAD (coronary artery disease) 02/27/2015   a.) MPI 02/27/2015: EF 62%, small/mild area of septal hypoperfusion with borderline partial redistribution; equivocal study; b.) LHC 03/13/2015: LVEDP 16 mmHg; 10% mLAD, 10% RCA --> no intervention required (med mgmt); c.) MPI 03/16/2019: EF 56%; normal study.   Cataracts, both eyes    Cervical disc disease    a.) s/p cervical fusion   Chickenpox    CKD (chronic kidney disease), stage IV (HCC)    Complication of anesthesia    a.) remote h/o PONV; b.) difficult intubation related to remote cervical fusion   Difficult intubation    a.) secondary to remote cervical  fusion; reports severe pain s/p being intubated   Dyspnea on exertion    HFrEF (heart failure with reduced ejection fraction) (HCC)    a.) TTE 01/11/2014: EF >55%, mod LVH, MAC, mild RAE, mild MR/TR, sev PR; b.) TTE 11/14/2018: EF 40%, sep and inf HK, mild LAE, triv P, mod MR/TR, G2DD; c.) TTE 11/15/2020: EF 45%, sep HK, mild LVH, triv AR/PR, mild MR/TR, d.) TTE 10/28/2021: EF 45%, mild glob HK, mild LVH, triv AR/PR, mild MR, mod TR; e.) TTE 11/06/2021: EF 30-35%, mid apical and anteroseptal HK, mild TR, mod MR, G2DD.   History of recurrent UTI (urinary tract infection)    Hyperlipidemia    Hypertension    Incontinence in female    wears pads   LBBB (left bundle branch block)    Long term (current) use of anticoagulants    a.) rivaroxaban   Long-term current use of opiate analgesic  a.) hydrocodone/APAP (Norco)   Lumbar disc disease    a.) s/p lumbar interbody fusion L2-L5   PAF (paroxysmal atrial fibrillation) (HCC)    a.) CHA2DS2VASc = 7 (age x2, sex, HFrEF, HTN, vascular diasease, T2DM); b.) s/p DCCV 01/12/2017 (150 J x 1) and 11/04/2021 (120 J x 1); c.) rate/rhythm maintained on oral amiodarone + metoprolol succinate; chronically anticoagulated with rivaroxaban   PONV (postoperative nausea and vomiting)    with 1st pregnancy 50 years ago and no problem since then   Right leg weakness    Sciatica    Spinal stenosis of lumbar region    Type 2 diabetes mellitus treated with insulin (HCC)    Ventral hernia    Social History   Socioeconomic History   Marital status: Married    Spouse name: Molly Maduro   Number of children: Not on file   Years of education: Not on file   Highest education level: Not on file  Occupational History   Not on file  Tobacco Use   Smoking status: Never    Passive exposure: Never   Smokeless tobacco: Never  Vaping Use   Vaping status: Never Used  Substance and Sexual Activity   Alcohol use: Yes    Alcohol/week: 1.0 standard drink of alcohol    Types:  1 Glasses of wine per week    Comment: RARE   Drug use: Never   Sexual activity: Not Currently    Birth control/protection: Post-menopausal  Other Topics Concern   Not on file  Social History Narrative   Not on file   Social Determinants of Health   Financial Resource Strain: Low Risk  (09/29/2023)   Received from [facility]   Overall Financial Resource Strain (CARDIA)    Difficulty of Paying Living Expenses: Not hard at all  Food Insecurity: No Food Insecurity (11/16/2023)   Hunger Vital Sign    Worried About Running Out of Food in the Last Year: Never true    Ran Out of Food in the Last Year: Never true  Transportation Needs: No Transportation Needs (11/16/2023)   PRAPARE - Administrator, Civil Service (Medical): No    Lack of Transportation (Non-Medical): No  Physical Activity: Not on file  Stress: Not on file  Social Connections: Not on file   Family History  Adopted: Yes   Scheduled Meds:  allopurinol  50 mg Oral Daily   amiodarone  200 mg Oral Daily   apixaban  2.5 mg Oral BID   Chlorhexidine Gluconate Cloth  6 each Topical Daily   midodrine  10 mg Oral TID WC   Continuous Infusions:  promethazine (PHENERGAN) injection (IM or IVPB)     PRN Meds:.acetaminophen **OR** acetaminophen, antiseptic oral rinse, fentaNYL (SUBLIMAZE) injection, glycopyrrolate **OR** glycopyrrolate **OR** glycopyrrolate, haloperidol **OR** haloperidol **OR** haloperidol lactate, ondansetron **OR** ondansetron (ZOFRAN) IV, mouth rinse, oxyCODONE, polyvinyl alcohol, promethazine (PHENERGAN) injection (IM or IVPB) Medications Prior to Admission:  Prior to Admission medications   Medication Sig Start Date End Date Taking? Authorizing Provider  acetaminophen (TYLENOL) 500 MG tablet Take 1 tablet (500 mg total) by mouth every 8 (eight) hours as needed for mild pain (pain score 1-3). Patient taking differently: Take 500 mg by mouth every 6 (six) hours as needed for mild  pain (pain score 1-3). 10/28/23  Yes Sunnie Nielsen, DO  allopurinol (ZYLOPRIM) 100 MG tablet Take 0.5 tablets (50 mg total) by mouth daily. 11/04/23  Yes Sharon Seller, NP  amiodarone (PACERONE)  200 MG tablet Take 200 mg by mouth daily.   Yes [provider]  apixaban (ELIQUIS) 2.5 MG TABS tablet Take 1 tablet (2.5 mg total) by mouth 2 (two) times daily. 11/04/23  Yes Sharon Seller, NP  cephALEXin (KEFLEX) 500 MG capsule Take 500 mg by mouth 3 (three) times daily. 11/11/23 11/18/23 Yes [provider]  HYDROcodone-acetaminophen (NORCO/VICODIN) 5-325 MG tablet Take 1 tablet by mouth every 6 (six) hours as needed for moderate pain (pain score 4-6).   Yes [provider]  insulin aspart (NOVOLOG) 100 UNIT/ML injection Inject 0-20 Units into the skin 3 (three) times daily with meals. CBG < 70: administer glucose. CBG 70 - 120: 0 units; CBG 121 - 150: 4 units; CBG 151 - 200: 5 units; CBG 201 - 250: 7 units; CBG 251 - 300: 11 units; CBG 301 - 350: 15 units; CBG 351 - 400: 20 units; CBG > 400: call MD Patient taking differently: Inject 0-20 Units into the skin 2 (two) times daily. CBG < 70: administer glucose. CBG 70 - 120: 0 units; CBG 121 - 150: 4 units; CBG 151 - 200: 5 units; CBG 201 - 250: 7 units; CBG 251 - 300: 11 units; CBG 301 - 350: 15 units; CBG 351 - 400: 20 units; CBG > 400: call MD 10/28/23  Yes Sunnie Nielsen, DO  insulin glargine-yfgn (SEMGLEE) 100 UNIT/ML injection Inject 0.08 mLs (8 Units total) into the skin daily. Patient taking differently: Inject 10 Units into the skin daily. 10/28/23  Yes Sunnie Nielsen, DO  linagliptin (TRADJENTA) 5 MG TABS tablet Take 1 tablet (5 mg total) by mouth daily. 10/28/23  Yes Sunnie Nielsen, DO  metoprolol succinate (TOPROL-XL) 25 MG 24 hr tablet Take 0.5 tablets (12.5 mg total) by mouth 2 (two) times daily. 10/28/23  Yes Sunnie Nielsen, DO  midodrine (PROAMATINE) 10 MG tablet Take 10 mg by mouth daily.  11/08/23  Yes [provider]  multivitamin (RENA-VIT) TABS tablet Take 1 tablet by mouth at bedtime. 10/28/23  Yes Sunnie Nielsen, DO  ondansetron (ZOFRAN) 4 MG tablet Take 4 mg by mouth every 8 (eight) hours as needed for nausea or vomiting.   Yes [provider]  PROAIR RESPICLICK 108 (90 Base) MCG/ACT AEPB Inhale 2 puffs into the lungs 4 (four) times daily as needed. 10/06/23  Yes [provider]  torsemide (DEMADEX) 20 MG tablet Take 1 tablet (20 mg total) by mouth every Tuesday, Thursday, and Saturday at 6 PM. 10/28/23  Yes Sunnie Nielsen, DO  Vitamin D, Ergocalciferol, (DRISDOL) 1.25 MG (50000 UNIT) CAPS capsule Take 50,000 Units by mouth once a week. 11/09/23  Yes [provider]  glucose blood (PRECISION QID TEST) test strip Use 2 (two) times daily Use as instructed. 05/03/20   [provider]  insulin aspart (NOVOLOG) 100 UNIT/ML injection Inject 0-5 Units into the skin at bedtime. Patient not taking: Reported on 10/29/2023 10/28/23   Sunnie Nielsen, DO  Insulin Pen Needle (BD PEN NEEDLE NANO U/F) 32G X 4 MM MISC USE WITH INSULIN PEN TWICE DAILY OR AS DIRECTED 10/18/19   [provider]  Nutritional Supplements (,FEEDING SUPPLEMENT, PROSOURCE PLUS) liquid Take 30 mLs by mouth 3 (three) times daily between meals. 10/28/23   Sunnie Nielsen, DO   Allergies  Allergen Reactions   Amlodipine Swelling   Codeine Nausea And Vomiting    Tolerates low doses hydrocodone   Sacubitril-Valsartan Other (See Comments)    Acute kidney injury   Tizanidine  Didn't feel good taking it   Doxycycline Nausea And Vomiting   Review of Systems  Gastrointestinal:  Positive for nausea.    Physical Exam Pulmonary:     Effort: Pulmonary effort is normal.  Neurological:     Mental Status: She is alert.     Vital Signs: BP (!) 100/50 (BP Location: Left Wrist)   Pulse 94   Temp 98.4 F (36.9 C) (Oral)   Resp 18   Ht 5\' 2"  (1.575 m)    Wt 89.8 kg   SpO2 96%   BMI 36.21 kg/m  Pain Scale: 0-10   Pain Score: 0-No pain   SpO2: SpO2: 96 % O2 Device:SpO2: 96 % O2 Flow Rate: .O2 Flow Rate (L/min): 2 L/min  IO: Intake/output summary:  Intake/Output Summary (Last 24 hours) at 11/17/2023 1425 Last data filed at 11/17/2023 1034 Gross per 24 hour  Intake 470 ml  Output 75 ml  Net 395 ml    LBM:   Baseline Weight: Weight: 89.8 kg Most recent weight: Weight: 89.8 kg       Signed by: Morton Stall, NP   Please contact Palliative Medicine Team phone at [phone] for questions and concerns.  For individual provider: See Loretha Stapler

## 2023-11-17 NOTE — Progress Notes (Signed)
PHARMACY - PHYSICIAN COMMUNICATION CRITICAL VALUE ALERT - BLOOD CULTURE IDENTIFICATION (BCID)  Tiffany [name] is an 89 y.o. female who presented to [facility] on 11/16/2023 with a chief complaint of sepsis.  Assessment:  Pseudomonas growing in 1 of 4 bottles, no resistance.  (include suspected source if known)  Name of physician (or Provider) Contacted: Larkin Ina, NP   Current antibiotics: Vanc, cefepime   Changes to prescribed antibiotics recommended:  Recommendations accepted by provider - will d/c vanc and continue with cefepime   Results for orders placed or performed during the hospital encounter of 11/16/23  Blood Culture ID Panel (Reflexed) (Collected: 11/16/2023  8:05 AM)  Result Value Ref Range   Enterococcus faecalis NOT DETECTED NOT DETECTED   Enterococcus Faecium NOT DETECTED NOT DETECTED   Listeria monocytogenes NOT DETECTED NOT DETECTED   Staphylococcus species NOT DETECTED NOT DETECTED   Staphylococcus aureus (BCID) NOT DETECTED NOT DETECTED   Staphylococcus epidermidis NOT DETECTED NOT DETECTED   Staphylococcus lugdunensis NOT DETECTED NOT DETECTED   Streptococcus species NOT DETECTED NOT DETECTED   Streptococcus agalactiae NOT DETECTED NOT DETECTED   Streptococcus pneumoniae NOT DETECTED NOT DETECTED   Streptococcus pyogenes NOT DETECTED NOT DETECTED   A.calcoaceticus-baumannii NOT DETECTED NOT DETECTED   Bacteroides fragilis NOT DETECTED NOT DETECTED   Enterobacterales NOT DETECTED NOT DETECTED   Enterobacter cloacae complex NOT DETECTED NOT DETECTED   Escherichia coli NOT DETECTED NOT DETECTED   Klebsiella aerogenes NOT DETECTED NOT DETECTED   Klebsiella oxytoca NOT DETECTED NOT DETECTED   Klebsiella pneumoniae NOT DETECTED NOT DETECTED   Proteus species NOT DETECTED NOT DETECTED   Salmonella species NOT DETECTED NOT DETECTED   Serratia marcescens NOT DETECTED NOT DETECTED   Haemophilus influenzae NOT DETECTED NOT DETECTED   Neisseria meningitidis NOT  DETECTED NOT DETECTED   Pseudomonas aeruginosa DETECTED (A) NOT DETECTED   Stenotrophomonas maltophilia NOT DETECTED NOT DETECTED   Candida albicans NOT DETECTED NOT DETECTED   Candida auris NOT DETECTED NOT DETECTED   Candida glabrata NOT DETECTED NOT DETECTED   Candida krusei NOT DETECTED NOT DETECTED   Candida parapsilosis NOT DETECTED NOT DETECTED   Candida tropicalis NOT DETECTED NOT DETECTED   Cryptococcus neoformans/gattii NOT DETECTED NOT DETECTED   CTX-M ESBL NOT DETECTED NOT DETECTED   Carbapenem resistance IMP NOT DETECTED NOT DETECTED   Carbapenem resistance KPC NOT DETECTED NOT DETECTED   Carbapenem resistance NDM NOT DETECTED NOT DETECTED   Carbapenem resistance VIM NOT DETECTED NOT DETECTED    Beatriz Quintela D 11/17/2023  3:29 AM

## 2023-11-17 NOTE — Plan of Care (Signed)
  Problem: Respiratory: Goal: Ability to maintain adequate ventilation will improve Outcome: Progressing   Problem: Coping: Goal: Ability to adjust to condition or change in health will improve Outcome: Progressing   Problem: Metabolic: Goal: Ability to maintain appropriate glucose levels will improve Outcome: Progressing   Problem: Clinical Measurements: Goal: Diagnostic test results will improve Outcome: Progressing Goal: Signs and symptoms of infection will decrease Outcome: Progressing   Problem: Skin Integrity: Goal: Risk for impaired skin integrity will decrease Outcome: Not Progressing

## 2023-11-17 NOTE — Progress Notes (Signed)
CBG was 53 and patient drank 4 oz of juice. CBG rechecked and 59. IV dextrose given per protocol. RN made Dr. Lucianne Muss aware of low glucose this morning and now and MD placed order for IVF with dextrose. CBG rechecked and 117 after 12.5 g dextrose given via IV. Patient arouses to voice drinking juice and eating apple sauce.

## 2023-11-17 NOTE — Consult Note (Signed)
Pharmacy Antibiotic Note  Tiffany [name] is a 89 y.o. female admitted on 11/16/2023 with cellulitis. Patient recently filled a 7 day supply of Keflex on 11/14. Of note, patient recently started dialysis three weeks ago. Pharmacy has been consulted for cefepime dosing. Patient's blood cultures with P. Aeruginosa. ID consulted  Today, 11/16/2023:  WBC 18.3 Tm 102.9 Scr 3.31 - started on HD last month, currently being held during goals of care discussion Blood cx with pseudomonas Urine cx with GNR Has ureteral stent w/ L stent exchange 03/12/23 for hydronephrosis of unclear etiology   Plan: Adjust cefepime to 1gm IV q24h based on renal function and expected worsening of SCr as HD is being held to reduce likelihood of cefepime-induced neurotoxicity.   Pharmacy will continue to monitor and dose adjust appropriately   Height: 5\' 2"  (157.5 cm) Weight: 89.8 kg (198 lb) IBW/kg (Calculated) : 50.1  Temp (24hrs), Avg:100.1 F (37.8 C), Min:97.9 F (36.6 C), Max:102.9 F (39.4 C)  Recent Labs  Lab 11/16/23 0805 11/16/23 0955 11/16/23 1653 11/17/23 0302  WBC 14.8*  --   --  18.3*  CREATININE 3.16*  --   --  3.31*  LATICACIDVEN 2.9* 2.3* 2.4*  --     Estimated Creatinine Clearance: 12.2 mL/min (A) (by C-G formula based on SCr of 3.31 mg/dL (H)).    Allergies  Allergen Reactions   Amlodipine Swelling   Codeine Nausea And Vomiting    Tolerates low doses hydrocodone   Sacubitril-Valsartan Other (See Comments)    Acute kidney injury   Tizanidine     Didn't feel good taking it   Doxycycline Nausea And Vomiting   Antimicrobials this admission: Vancomycin 11/19 >> 11/19 Cefepime 11/19 >>   Dose adjustments this admission:  Microbiology results: 11/19 BCx: GNR, BCID P aeruginosa 11/19 UCx: GNR  Thank you for allowing pharmacy to be a part of this patient's care.  Juliette Alcide, PharmD, BCPS, BCIDP Work Cell: [phone] 11/17/2023 11:59 AM

## 2023-11-17 NOTE — Progress Notes (Signed)
Transferred to room 125 by bed with RN. Patient responds to voice. Mardene Celeste, RN at bedside. Called and made patient's husband aware that patient was moved to new room.

## 2023-11-17 NOTE — Progress Notes (Signed)
[facility]  ROUNDING NOTE   Subjective:   Patient well-known to [place] as we follow her for outpatient hemodialysis. Patient recently started on dialysis for ESRD. She was brought in from nursing facility for increasing confusion and lethargy.  Patient presented with a temperature of 100.9 with a heart rate in the 100s. Patient's husband was at the bedside in the intensive care unit when seen shortly after admission. He states he has spoken with the patient and the family about dialysis and they would like to hold off on further dialysis treatments.  Update: Patient resting comfortably in bed at the moment.  Her husband is also at the bedside.  She denies shortness of breath at the moment.  Still has considerable bilateral lower extremity edema.  They maintain their desire for no further dialysis treatments.   Objective:  Vital signs in last 24 hours:  Temp:  [97.9 F (36.6 C)-102.9 F (39.4 C)] 99.4 F (37.4 C) (11/20 0749) Pulse Rate:  [72-106] 85 (11/20 0751) Resp:  [20-30] 20 (11/20 0751) BP: (84-118)/(40-65) 102/60 (11/19 2249) SpO2:  [84 %-100 %] 100 % (11/20 0751)  Weight change:  Filed Weights   11/16/23 0756  Weight: 89.8 kg    Intake/Output: I/O last 3 completed shifts: In: 1540 [P.O.:240; IV Piggyback:1300] Out: 75 [Urine:75]   Intake/Output this shift:  No intake/output data recorded.  Physical Exam: General: Chronically ill-appearing  Head: Normocephalic, atraumatic. Moist oral mucosal membranes  Eyes: Anicteric  Lungs:  Basilar rales  Heart: S1S2 no rubs  Abdomen:  Soft, nontender  Extremities: 2+ bilateral lower extremity edema with significant erythema and left lower extremity  Neurologic: Alert, moving all four extremities  Skin: Visible erythema in the left lower extremity, right lower extremity covered  Access:  Rt Chest permcath placed on 10/22/23    Basic Metabolic Panel: Recent Labs  Lab 11/16/23 0805 11/17/23 0302  NA 130*  130*  K 3.4* 3.1*  CL 89* 96*  CO2 26 25  GLUCOSE 229* 124*  BUN 29* 35*  CREATININE 3.16* 3.31*  CALCIUM 8.3* 8.0*    Liver Function Tests: Recent Labs  Lab 11/16/23 0805 11/17/23 0302  AST 38 103*  ALT 28 60*  ALKPHOS 81 56  BILITOT 1.0 1.1  PROT 5.9* 4.6*  ALBUMIN 2.8* 2.0*   No results for input(s): "LIPASE", "AMYLASE" in the last 168 hours. No results for input(s): "AMMONIA" in the last 168 hours.  CBC: Recent Labs  Lab 11/16/23 0805 11/17/23 0302  WBC 14.8* 18.3*  NEUTROABS 11.6*  --   HGB 10.7* 8.5*  HCT 34.4* 26.2*  MCV 88.0 85.6  PLT 186 138*    Cardiac Enzymes: No results for input(s): "CKTOTAL", "CKMB", "CKMBINDEX", "TROPONINI" in the last 168 hours.   BNP: Invalid input(s): "POCBNP"  CBG: Recent Labs  Lab 11/16/23 1525 11/17/23 0735  GLUCAP 167* 59*    Microbiology: Results for orders placed or performed during the hospital encounter of 11/16/23  Blood Culture (routine x 2)     Status: None (Preliminary result)   Collection Time: 11/16/23  8:05 AM   Specimen: BLOOD  Result Value Ref Range Status   Specimen Description BLOOD RIGHT UPPER ARM  Final   Special Requests BOTTLES DRAWN AEROBIC AND ANAEROBIC  Final   Culture   Final    NO GROWTH < 24 HOURS Performed at [facility], [address] [place], [place] [address]    Report Status PENDING  Incomplete  Blood Culture (routine x  2)     Status: None (Preliminary result)   Collection Time: 11/16/23  8:05 AM   Specimen: BLOOD  Result Value Ref Range Status   Specimen Description BLOOD LEFT WRIST  Final   Special Requests   Final    BOTTLES DRAWN AEROBIC AND ANAEROBIC Blood Culture adequate volume   Culture  Setup Time   Final    Organism ID to follow GRAM NEGATIVE RODS AEROBIC BOTTLE ONLY CRITICAL RESULT CALLED TO, READ BACK BY AND VERIFIED WITH: JASON ROBBINS PHARMD @0307  11/17/23 ASW Performed at [facility], [address] [place], [place] [address]     Culture GRAM NEGATIVE RODS  Final   Report Status PENDING  Incomplete  Blood Culture ID Panel (Reflexed)     Status: Abnormal   Collection Time: 11/16/23  8:05 AM  Result Value Ref Range Status   Enterococcus faecalis NOT DETECTED NOT DETECTED Final   Enterococcus Faecium NOT DETECTED NOT DETECTED Final   Listeria monocytogenes NOT DETECTED NOT DETECTED Final   Staphylococcus species NOT DETECTED NOT DETECTED Final   Staphylococcus aureus (BCID) NOT DETECTED NOT DETECTED Final   Staphylococcus epidermidis NOT DETECTED NOT DETECTED Final   Staphylococcus lugdunensis NOT DETECTED NOT DETECTED Final   Streptococcus species NOT DETECTED NOT DETECTED Final   Streptococcus agalactiae NOT DETECTED NOT DETECTED Final   Streptococcus pneumoniae NOT DETECTED NOT DETECTED Final   Streptococcus pyogenes NOT DETECTED NOT DETECTED Final   A.calcoaceticus-baumannii NOT DETECTED NOT DETECTED Final   Bacteroides fragilis NOT DETECTED NOT DETECTED Final   Enterobacterales NOT DETECTED NOT DETECTED Final   Enterobacter cloacae complex NOT DETECTED NOT DETECTED Final   Escherichia coli NOT DETECTED NOT DETECTED Final   Klebsiella aerogenes NOT DETECTED NOT DETECTED Final   Klebsiella oxytoca NOT DETECTED NOT DETECTED Final   Klebsiella pneumoniae NOT DETECTED NOT DETECTED Final   Proteus species NOT DETECTED NOT DETECTED Final   Salmonella species NOT DETECTED NOT DETECTED Final   Serratia marcescens NOT DETECTED NOT DETECTED Final   Haemophilus influenzae NOT DETECTED NOT DETECTED Final   Neisseria meningitidis NOT DETECTED NOT DETECTED Final   Pseudomonas aeruginosa DETECTED (A) NOT DETECTED Final    Comment: CRITICAL RESULT CALLED TO, READ BACK BY AND VERIFIED WITH: JASON ROBBINS PHARMD @0307  11/17/23 ASW    Stenotrophomonas maltophilia NOT DETECTED NOT DETECTED Final   Candida albicans NOT DETECTED NOT DETECTED Final   Candida auris NOT DETECTED NOT DETECTED Final   Candida glabrata NOT DETECTED  NOT DETECTED Final   Candida krusei NOT DETECTED NOT DETECTED Final   Candida parapsilosis NOT DETECTED NOT DETECTED Final   Candida tropicalis NOT DETECTED NOT DETECTED Final   Cryptococcus neoformans/gattii NOT DETECTED NOT DETECTED Final   CTX-M ESBL NOT DETECTED NOT DETECTED Final   Carbapenem resistance IMP NOT DETECTED NOT DETECTED Final   Carbapenem resistance KPC NOT DETECTED NOT DETECTED Final   Carbapenem resistance NDM NOT DETECTED NOT DETECTED Final   Carbapenem resistance VIM NOT DETECTED NOT DETECTED Final    Comment: Performed at [facility], [address] [place], [place] [address]    Coagulation Studies: No results for input(s): "LABPROT", "INR" in the last 72 hours.  Urinalysis: Recent Labs    11/16/23 0805  COLORURINE YELLOW*  LABSPEC 1.023  PHURINE 5.0  GLUCOSEU NEGATIVE  HGBUR MODERATE*  BILIRUBINUR NEGATIVE  KETONESUR 5*  PROTEINUR >=300*  NITRITE NEGATIVE  LEUKOCYTESUR MODERATE*      Imaging: DG Chest Port 1 View  Result  Date: 11/16/2023 CLINICAL DATA:  Questionable sepsis - evaluate for abnormality EXAM: PORTABLE CHEST 1 VIEW COMPARISON:  None Available. FINDINGS: Tunneled hemodialysis catheter with tip terminating in the right atrium. Low lung volumes. The cardiomediastinal silhouette is within normal limits. No pleural effusion. No pneumothorax. No mass or consolidation. No acute osseous abnormality. IMPRESSION: No acute findings in the chest. Electronically Signed   By: Olive Bass M.D.   On: 11/16/2023 13:13     Medications:    ceFEPime (MAXIPIME) IV      allopurinol  50 mg Oral Daily   amiodarone  200 mg Oral Daily   apixaban  2.5 mg Oral BID   Chlorhexidine Gluconate Cloth  6 each Topical Daily   midodrine  10 mg Oral TID WC   acetaminophen, morphine injection, ondansetron **OR** ondansetron (ZOFRAN) IV, mouth rinse  Assessment/ Plan:  [name] is a 89 y.o.  female with past medical history including  chronic heart failure, A-fib with recent cardioversion on Xarelto, CAD, and chronic kidney disease stage IV-5, who was admitted to [facility] on 11/16/2023 for Sepsis (HCC) [A41.9]   End stage renal disease requiring hemodialysis.  Patient recently started on outpatient hemodialysis treatments.  She has had some issues with hypotension during treatments but otherwise has been tolerating relatively well.  Family decided upon admission that they would like to hold off on further dialysis treatments.  Recommend palliative and hospice care evaluation.  Lab Results  Component Value Date   CREATININE 3.31 (H) 11/17/2023   CREATININE 3.16 (H) 11/16/2023   CREATININE 3.02 (H) 10/27/2023    Intake/Output Summary (Last 24 hours) at 11/17/2023 0818 Last data filed at 11/17/2023 0600 Gross per 24 hour  Intake 1540 ml  Output 75 ml  Net 1465 ml   2.  Acute on chronic combined heart failure.  Last echo 10/24 with EF of 40 to 45% and grade 1 diastolic dysfunction.  No further ultrafiltration with dialysis at the moment.   3. Diabetes mellitus type II with chronic kidney disease/renal manifestations: Glycemic control per hospitalist.  4.  Left kidney renal mass, seen on MRI abdomen with/without contrast measuring 1.7 to 1.4 cm, consistent with small renal cell carcinoma.  No additional workup necessary.  5. Anemia of chronic kidney disease  Lab Results  Component Value Date   HGB 8.5 (L) 11/17/2023    No immediate need for there is a point stimulating agents.   LOS: 1 Jeran Hiltz 11/20/20248:18 AM

## 2023-11-17 NOTE — Progress Notes (Signed)
[facility]- [employer] engineer, [employer]  Received a referral from [facility], [place], to evaluate patient for the Hospice Home.  Spoke with patient's daughter in law and spouse about Hospice services at the inpatient unit.  Spouse decided that [facility].  HL asked TOC to reach out to [facility] to coordinate discharge.  Referral submitted today for Hospice services at [facility].  Please don't hesitate to call with any Hospice related questions or concerns.    Thank you for the opportunity to participate in this patient's care. [facility] Liaison [phone]  [facility] Liaison

## 2023-11-17 NOTE — Consult Note (Signed)
WOC Nurse Consult Note: Reason for Consult: LE wounds and cellulitis Patient has been made comfort care after this consult was entered.  Wound type: Full thickness ulcerations right lateral leg; all 100% yellow slough Large partial thickness hematoma of the left lateral leg Deep Tissue Pressure Injury: left heel; intact blood filled blister Full thickness ulcerations left flexor/dorsal foot; 100% clean Deep Tissue Pressure injury: left dorsal foot; 100% dark purple  Pressure Injury POA: Yes Measurement: see nursing flow sheets Wound bed:see above  Drainage (amount, consistency, odor) none documented  Periwound: edema  Dressing procedure/placement/frequency: Palliative wound care ordered Cover open wounds of the bilateral LE with single layer of xeroform gauze, top with ABD and kerlix.  Large hematoma as well can be covered with Vaseline gauze or xeroform gauze. Would not use foam as this will most likely rupture.   Change all other dressings every 3 days.  Paint heel wound with betadine daily. Offload heel to prevent worsening of heel ulcer with pillows.     Re consult if needed, will not follow at this time. Thanks  Arlyne Brandes [employer], RN,CWOCN, CNS, CWON-AP [phone])

## 2023-11-17 NOTE — Plan of Care (Signed)
PMT consulted for GOC. Per report, husband is a retired hospitalist/internist. He is currently talking on the phone and working with other disciplines. PMT will follow up at a later time.

## 2023-11-17 NOTE — Consult Note (Signed)
[facility]    Date of Admission:  11/16/2023     Reason for Consult: sepsis, pseudomonas bacteremia    Referring Provider: Gillis Santa     Lines:  Right chest hd catheter placed 10/25  Abx: 11/19-c vanc 11/19-c cefepime        Assessment: 89 yo female with HFrEF, ckd4-5 recently started on iHD via right chest hd catheter, afib on xarelto, dm2, peripheral neuropathy, unclear-cause left hydronephrosis requiring chronic stent (last exchanged 02/2023), left small renal mass presumed rcc no planned further w/u, hx left knee arthroplasty, lumbago hx of lumbar fusion (no hardware per her and her husband's report), chronic bilateral LE swelling with venous stasis dermatitis/open wound, recent hospital admission 10/22-10/31 for acute on chronic right leg pain treated as cellulitis started on iHD at that time with course complicated by klebsiella oxytoca (prior to placement of catheter), now readmitted 11/19 for acute ams and meeting sepsis criteria in setting pseudomonas bsi and chronic foley catheter   Foley changed 10 days prior to this admission Renal stent left ureter changed 02/2023  Unclear source kleb oxytoca 10/19/23 admission s/p treatment course -- no ucx done at that time   Bilateral chronic venous stasis/open wound. This admission with a hematoma posterior left leg. Wound care evaluated. Appears to me this is venous stasis with open wound   PsA bacteremia source ?hd catheter, open wound with systemic translocation or GU in setting foley and renal stent   The left knee prosthesis doesn't appear involved. Spine chronic cervical/lumbar pain will need to monitor   Per extensive discussion with patient and her husband, they are leaning on comfort care as patient doesn't want to do dialysis further and she is overwhelmed with chronic legs pain, back pain  Discussed and agreed with husband goals of abx at end of life and that it doesn't  contribute any benefit. Once family decided comfort care would stop iv abx. The hd catheter if without use I would also advise removing   If patient decides full care, the workup would be repeat blood cx for clearance in setting of the line present and also advising to remove the HD catheter and exchange the foley catheter   She is making minimal urine and unclear if foley catheter is really needed though       Plan: Continue cefepime; stop vancomycin Once patient decide comfort care would dc cefepime (maximum length for pseudomonas treatment is 10 days)  Source/metastatic seeding workup would defer for now until goals of care clarified, but anticipate benefit of removing the hd catheter especially now that she doesn't want any more dialysis  Ct of abd/pelv potentially might add some marginal diagnostic w/u for pseudomonas bsi, but again source could also be open wound/hd catheter infection F/u goals of care Wound care for legs wound -- consider triamcinolone cream for components of venous stasis Discussed with primary team      ------------------------------------------------ Principal Problem:   Sepsis (HCC) Active Problems:   Atrial fibrillation, chronic (HCC)   Cellulitis   Hypertensive heart and chronic kidney disease with heart failure and stage 1 through stage 4 chronic kidney disease, or unspecified chronic kidney disease (HCC)   Insulin dependent type 2 diabetes mellitus (HCC)   UTI (urinary tract infection)   HFrEF (heart failure with reduced ejection fraction) (HCC)   ESRD (end stage renal disease) (HCC)   Encephalopathy    HPI: Tiffany [name] is a 89  y.o. female with HFrEF, ckd4-5 recently started on iHD via right chest hd catheter, afib on xarelto, dm2, peripheral neuropathy, unclear-cause left hydronephrosis requiring chronic stent (last exchanged 02/2023), left small renal mass presumed rcc no planned further w/u, hx left knee arthroplasty, lumbago hx of  lumbar fusion (no hardware per her and her husband's report), chronic bilateral LE swelling with venous stasis dermatitis/open wound, recent hospital admission 10/22-10/31 for acute on chronic right leg pain treated as cellulitis started on iHD at that time with course complicated by klebsiella oxytoca (prior to placement of catheter), now readmitted 11/19 for acute ams and meeting sepsis criteria in setting pseudomonas bsi and chronic foley catheter  Chart reviewed Discussed case with husband and patient  Patient recent admission discharged home  She has some improvement in health, and has been going to dialysis  The day or two before admission confused. So came for evaluation 11/19 where noted to have fever  Chronic back (lumbar/cervical pain) and bilateral legs pain. Unclear if any change in bilateral legs -- hematoma left calf noted unclear duration; chronic open wound right leg  Foley last changed 10 days prior to admission and no cva tenderness/n/v/diarrhea. She is actually on the constipated side  No dyspnea/cough/sob  No headache  No uri sx  W/u showed ucx > 100k gnr and bcx pseudomonas  Started on vanc/cefepime  Significant improvement in mentation today   Family leaning toward comfort care  Family History  Adopted: Yes    Social History   Tobacco Use   Smoking status: Never    Passive exposure: Never   Smokeless tobacco: Never  Vaping Use   Vaping status: Never Used  Substance Use Topics   Alcohol use: Yes    Alcohol/week: 1.0 standard drink of alcohol    Types: 1 Glasses of wine per week    Comment: RARE   Drug use: Never    Allergies  Allergen Reactions   Amlodipine Swelling   Codeine Nausea And Vomiting    Tolerates low doses hydrocodone   Sacubitril-Valsartan Other (See Comments)    Acute kidney injury   Tizanidine     Didn't feel good taking it   Doxycycline Nausea And Vomiting    Review of Systems: ROS All Other ROS was negative, except  mentioned above   Past Medical History:  Diagnosis Date   Anemia    Aortic atherosclerosis (HCC)    Arthritis    CAD (coronary artery disease) 02/27/2015   a.) MPI 02/27/2015: EF 62%, small/mild area of septal hypoperfusion with borderline partial redistribution; equivocal study; b.) LHC 03/13/2015: LVEDP 16 mmHg; 10% mLAD, 10% RCA --> no intervention required (med mgmt); c.) MPI 03/16/2019: EF 56%; normal study.   Cataracts, both eyes    Cervical disc disease    a.) s/p cervical fusion   Chickenpox    CKD (chronic kidney disease), stage IV (HCC)    Complication of anesthesia    a.) remote h/o PONV; b.) difficult intubation related to remote cervical fusion   Difficult intubation    a.) secondary to remote cervical fusion; reports severe pain s/p being intubated   Dyspnea on exertion    HFrEF (heart failure with reduced ejection fraction) (HCC)    a.) TTE 01/11/2014: EF >55%, mod LVH, MAC, mild RAE, mild MR/TR, sev PR; b.) TTE 11/14/2018: EF 40%, sep and inf HK, mild LAE, triv P, mod MR/TR, G2DD; c.) TTE 11/15/2020: EF 45%, sep HK, mild LVH, triv AR/PR, mild MR/TR, d.) TTE 10/28/2021:  EF 45%, mild glob HK, mild LVH, triv AR/PR, mild MR, mod TR; e.) TTE 11/06/2021: EF 30-35%, mid apical and anteroseptal HK, mild TR, mod MR, G2DD.   History of recurrent UTI (urinary tract infection)    Hyperlipidemia    Hypertension    Incontinence in female    wears pads   LBBB (left bundle branch block)    Long term (current) use of anticoagulants    a.) rivaroxaban   Long-term current use of opiate analgesic    a.) hydrocodone/APAP (Norco)   Lumbar disc disease    a.) s/p lumbar interbody fusion L2-L5   PAF (paroxysmal atrial fibrillation) (HCC)    a.) CHA2DS2VASc = 7 (age x2, sex, HFrEF, HTN, vascular diasease, T2DM); b.) s/p DCCV 01/12/2017 (150 J x 1) and 11/04/2021 (120 J x 1); c.) rate/rhythm maintained on oral amiodarone + metoprolol succinate; chronically anticoagulated with rivaroxaban    PONV (postoperative nausea and vomiting)    with 1st pregnancy 50 years ago and no problem since then   Right leg weakness    Sciatica    Spinal stenosis of lumbar region    Type 2 diabetes mellitus treated with insulin (HCC)    Ventral hernia        Scheduled Meds:  allopurinol  50 mg Oral Daily   amiodarone  200 mg Oral Daily   apixaban  2.5 mg Oral BID   Chlorhexidine Gluconate Cloth  6 each Topical Daily   midodrine  10 mg Oral TID WC   Continuous Infusions:  [START ON 11/18/2023] ceFEPime (MAXIPIME) IV     PRN Meds:.acetaminophen, morphine injection, ondansetron **OR** ondansetron (ZOFRAN) IV, mouth rinse   OBJECTIVE: Blood pressure 102/60, pulse 85, temperature 99.4 F (37.4 C), temperature source Oral, resp. rate 20, height 5\' 2"  (1.575 m), weight 89.8 kg, SpO2 100%.  Physical Exam General/constitutional: no distress, pleasant, some conversant, oriented and cooperative HEENT: Normocephalic, PER, Conj Clear, EOMI, Oropharynx clear Neck supple CV: rrr no mrg Lungs: clear to auscultation, normal respiratory effort Abd: Soft, Nontender Ext: no edema Skin: right chest hd catheter site no erythema/purulence; bilateral chronic venous stasis change with open wound that is clean based on rle and left lower with calf hematoma; bilateral slight erythema and tenderness that appears to be chronic Neuro: nonfocal -- generalized weakness MSK: no peripheral joint swelling in knees or tenderness/warmth   Central line presence: right chest hd catheter    Lab Results Lab Results  Component Value Date   WBC 18.3 (H) 11/17/2023   HGB 8.5 (L) 11/17/2023   HCT 26.2 (L) 11/17/2023   MCV 85.6 11/17/2023   PLT 138 (L) 11/17/2023    Lab Results  Component Value Date   CREATININE 3.31 (H) 11/17/2023   BUN 35 (H) 11/17/2023   NA 130 (L) 11/17/2023   K 3.1 (L) 11/17/2023   CL 96 (L) 11/17/2023   CO2 25 11/17/2023    Lab Results  Component Value Date   ALT 60 (H) 11/17/2023    AST 103 (H) 11/17/2023   ALKPHOS 56 11/17/2023   BILITOT 1.1 11/17/2023      Microbiology: Recent Results (from the past 240 hour(s))  Blood Culture (routine x 2)     Status: None (Preliminary result)   Collection Time: 11/16/23  8:05 AM   Specimen: BLOOD  Result Value Ref Range Status   Specimen Description BLOOD RIGHT UPPER ARM  Final   Special Requests BOTTLES DRAWN AEROBIC AND ANAEROBIC  Final   Culture  Final    NO GROWTH < 24 HOURS Performed at [facility], [address] [place], [place] [address]    Report Status PENDING  Incomplete  Blood Culture (routine x 2)     Status: None (Preliminary result)   Collection Time: 11/16/23  8:05 AM   Specimen: BLOOD  Result Value Ref Range Status   Specimen Description BLOOD LEFT WRIST  Final   Special Requests   Final    BOTTLES DRAWN AEROBIC AND ANAEROBIC Blood Culture adequate volume   Culture  Setup Time   Final    Organism ID to follow GRAM NEGATIVE RODS AEROBIC BOTTLE ONLY CRITICAL RESULT CALLED TO, READ BACK BY AND VERIFIED WITH: JASON ROBBINS PHARMD @0307  11/17/23 ASW Performed at [facility], [address], [place], [place] [address]    Culture GRAM NEGATIVE RODS  Final   Report Status PENDING  Incomplete  Urine Culture     Status: Abnormal (Preliminary result)   Collection Time: 11/16/23  8:05 AM   Specimen: Urine, Random  Result Value Ref Range Status   Specimen Description   Final    URINE, RANDOM Performed at [facility], [address], [place], [place] [address]    Special Requests   Final    NONE Reflexed from [id number] Performed at [facility], [address] [place], [place] [address]    Culture (A)  Final    >=100,000 COLONIES/mL GRAM NEGATIVE RODS IDENTIFICATION TO FOLLOW Performed at [facility], [address], [place], [place] [address]    Report Status PENDING  Incomplete  Blood Culture ID Panel (Reflexed)     Status: Abnormal   Collection  Time: 11/16/23  8:05 AM  Result Value Ref Range Status   Enterococcus faecalis NOT DETECTED NOT DETECTED Final   Enterococcus Faecium NOT DETECTED NOT DETECTED Final   Listeria monocytogenes NOT DETECTED NOT DETECTED Final   Staphylococcus species NOT DETECTED NOT DETECTED Final   Staphylococcus aureus (BCID) NOT DETECTED NOT DETECTED Final   Staphylococcus epidermidis NOT DETECTED NOT DETECTED Final   Staphylococcus lugdunensis NOT DETECTED NOT DETECTED Final   Streptococcus species NOT DETECTED NOT DETECTED Final   Streptococcus agalactiae NOT DETECTED NOT DETECTED Final   Streptococcus pneumoniae NOT DETECTED NOT DETECTED Final   Streptococcus pyogenes NOT DETECTED NOT DETECTED Final   A.calcoaceticus-baumannii NOT DETECTED NOT DETECTED Final   Bacteroides fragilis NOT DETECTED NOT DETECTED Final   Enterobacterales NOT DETECTED NOT DETECTED Final   Enterobacter cloacae complex NOT DETECTED NOT DETECTED Final   Escherichia coli NOT DETECTED NOT DETECTED Final   Klebsiella aerogenes NOT DETECTED NOT DETECTED Final   Klebsiella oxytoca NOT DETECTED NOT DETECTED Final   Klebsiella pneumoniae NOT DETECTED NOT DETECTED Final   Proteus species NOT DETECTED NOT DETECTED Final   Salmonella species NOT DETECTED NOT DETECTED Final   Serratia marcescens NOT DETECTED NOT DETECTED Final   Haemophilus influenzae NOT DETECTED NOT DETECTED Final   Neisseria meningitidis NOT DETECTED NOT DETECTED Final   Pseudomonas aeruginosa DETECTED (A) NOT DETECTED Final    Comment: CRITICAL RESULT CALLED TO, READ BACK BY AND VERIFIED WITH: JASON ROBBINS PHARMD @0307  11/17/23 ASW    Stenotrophomonas maltophilia NOT DETECTED NOT DETECTED Final   Candida albicans NOT DETECTED NOT DETECTED Final   Candida auris NOT DETECTED NOT DETECTED Final   Candida glabrata NOT DETECTED NOT DETECTED Final   Candida krusei NOT DETECTED NOT DETECTED Final   Candida parapsilosis NOT DETECTED NOT  DETECTED Final   Candida  tropicalis NOT DETECTED NOT DETECTED Final   Cryptococcus neoformans/gattii NOT DETECTED NOT DETECTED Final   CTX-M ESBL NOT DETECTED NOT DETECTED Final   Carbapenem resistance IMP NOT DETECTED NOT DETECTED Final   Carbapenem resistance KPC NOT DETECTED NOT DETECTED Final   Carbapenem resistance NDM NOT DETECTED NOT DETECTED Final   Carbapenem resistance VIM NOT DETECTED NOT DETECTED Final    Comment: Performed at [facility], [address], [place], [place] [address]     Serology:    Imaging: If present, new imagings (plain films, ct scans, and mri) have been personally visualized and interpreted; radiology reports have been reviewed. Decision making incorporated into the Impression / Recommendations.  11/19 cxr No acute findings in the chest.    Raymondo Band, MD [facility] [phone] pager    11/17/2023, 12:09 PM

## 2023-11-17 NOTE — Progress Notes (Signed)
[facility] Progress Note  Patient: Tiffany [name]    VEL:[id number]  DOA: 11/16/2023     Date of Service: the patient was seen and examined on 11/17/2023  Chief Complaint  Patient presents with   Sepsis alert   Altered Mental Status   Cellulitis   Brief hospital course: Kambria Klimaszewski is a 89 y.o. female with medical history significant of chronic HFrEF with LVEF 30-35%, CAD, CKD stage IV, PAF status post recent cardioversion on Xarelto, ureteral stone status post stenting April 2024, chronic back pain, IDDM, obesity presenting with sepsis, encephalopathy, UTI, cellulitis.  Per report, patient with worsening confusion and lethargy at local skilled nursing facility.  History primarily from patient's husband Dr. Nadine Counts who is a retired hospitalist/internist.  Noted have been recently admitted October 22 through October 31 for issues including CHF with concern for cardiorenal syndrome, recurrent right leg cellulitis with bacteremia.  Has had otherwise stable course from discharge.  On Monday Wednesday Friday schedule for dialysis.  No reported missed sessions.  No reported fevers or chills.  Right lower extremity cellulitis has relatively improved though with chronic venous stasis changes.  ED w/up: Tmax 100.9, heart rate 100s, respirations 20s, blood pressure 80s to 100s over 40s to 60s.  Initially in the mid 80s on room air, transition to 3 L nasal cannula.  White count 14.8, hemoglobin 10.7, platelets 186, creatinine 3.16, glucose 229, lactate 2.9-2.3.  Urinalysis indicative of infection.  Chest x-ray within normal limits.   Assessment and Plan:  Comfort measure only Oral patient had poor prognosis, seen by palliative care and discussed with family, patient was transition to comfort measures only. Discontinued antibiotics Rest of the medications will be discontinued as per palliative care depending on the prognosis tomorrow. Continue as needed comfort care medications.    Sepsis,  Pseudomonas bacteremia secondary to left lower extremity cellulitis and possible UTI.   S/p cefepime vancomycin, discontinued antibiotic due to comfort measures only status change Continue comfort measures as above  ESRD (end stage renal disease) (HCC) Baseline ESRD on HD MWF with concern for cardiorenal syndrome  No reported missed HD sessions  Nephrology was consulted.  Metabolic encephalopathy secondary to sepsis Continue supportive care  IDDM T2 Continue supportive care  paroxysmal A-fib, s/p cardioversion Continue amiodarone, Eliquis Hypotension, continue midodrine   Body mass index is 36.21 kg/m.  Interventions:  Pressure Injury 11/16/23 Heel Left Deep Tissue Pressure Injury - Purple or maroon localized area of discolored intact skin or blood-filled blister due to damage of underlying soft tissue from pressure and/or shear. (Active)  11/16/23 1500  Location: Heel  Location Orientation: Left  Staging: Deep Tissue Pressure Injury - Purple or maroon localized area of discolored intact skin or blood-filled blister due to damage of underlying soft tissue from pressure and/or shear.  Wound Description (Comments):   Present on Admission: Yes     Pressure Injury 11/16/23 Tibial Left;Posterior Deep Tissue Pressure Injury - Purple or maroon localized area of discolored intact skin or blood-filled blister due to damage of underlying soft tissue from pressure and/or shear. (Active)  11/16/23 1500  Location: Tibial  Location Orientation: Left;Posterior  Staging: Deep Tissue Pressure Injury - Purple or maroon localized area of discolored intact skin or blood-filled blister due to damage of underlying soft tissue from pressure and/or shear.  Wound Description (Comments):   Present on Admission:   Dressing Type Gauze (Comment) 11/16/23 2000     Pressure Injury 11/16/23 Tibial Posterior;Right Deep Tissue Pressure Injury -  Purple or maroon localized area of discolored intact skin or  blood-filled blister due to damage of underlying soft tissue from pressure and/or shear. (Active)  11/16/23 1500  Location: Tibial  Location Orientation: Posterior;Right  Staging: Deep Tissue Pressure Injury - Purple or maroon localized area of discolored intact skin or blood-filled blister due to damage of underlying soft tissue from pressure and/or shear.  Wound Description (Comments):   Present on Admission:   Dressing Type Gauze (Comment) 11/16/23 2000     Diet: Regular diet DVT Prophylaxis: Therapeutic Anticoagulation with Eliquis    Advance goals of care discussion: DNR comfort care  Family Communication: family was present at bedside, at the time of interview.  The pt provided permission to discuss medical plan with the family. Opportunity was given to ask question and all questions were answered satisfactorily.   Disposition:  Pt is from SNF, admitted with sepsis, Pseudomonas bacteremia due to LLE cellulitis and UTI.  Overall poor prognosis, palliative care consulted, patient has been transitioned to comfort measures only. Follow TOC for hospice placement.   Subjective: No significant overnight events, patient was AO x 1 to 2, very confused and delirious, unable to offer any complaints.  Discussed management plan with patient's husband at bedside.  Physical Exam: General: NAD, lying comfortably, confused Eyes: PERRLA ENT: Oral Mucosa Clear, moist  Neck: no JVD,  Cardiovascular: S1 and S2 Present, no Murmur,  Respiratory: good respiratory effort, Bilateral Air entry equal and Decreased, no Crackles, no wheezes Abdomen: Bowel Sound present, Soft and no tenderness,  Skin: LLE cellulitis, erythema and tenderness Extremities: Lymphedema, LLE erythematous and tender  Neurologic: without any new focal findings Gait not checked due to patient safety concerns  Vitals:   11/17/23 0749 11/17/23 0751 11/17/23 1100 11/17/23 1300  BP:    (!) 100/50  Pulse:  85  94  Resp:  20  18   Temp: 99.4 F (37.4 C)  98.4 F (36.9 C)   TempSrc: Oral  Oral   SpO2:  100%  96%  Weight:      Height:        Intake/Output Summary (Last 24 hours) at 11/17/2023 1622 Last data filed at 11/17/2023 1034 Gross per 24 hour  Intake 230 ml  Output 75 ml  Net 155 ml   Filed Weights   11/16/23 0756  Weight: 89.8 kg    Data Reviewed: I have personally reviewed and interpreted daily labs, tele strips, imagings as discussed above. I reviewed all nursing notes, pharmacy notes, vitals, pertinent old records I have discussed plan of care as described above with RN and patient/family.  CBC: Recent Labs  Lab 11/16/23 0805 11/17/23 0302  WBC 14.8* 18.3*  NEUTROABS 11.6*  --   HGB 10.7* 8.5*  HCT 34.4* 26.2*  MCV 88.0 85.6  PLT 186 138*   Basic Metabolic Panel: Recent Labs  Lab 11/16/23 0805 11/17/23 0302 11/17/23 0757  NA 130* 130*  --   K 3.4* 3.1*  --   CL 89* 96*  --   CO2 26 25  --   GLUCOSE 229* 124*  --   BUN 29* 35*  --   CREATININE 3.16* 3.31*  --   CALCIUM 8.3* 8.0*  --   MG  --   --  1.8  PHOS  --   --  4.2    Studies: No results found.  Scheduled Meds:  allopurinol  50 mg Oral Daily   amiodarone  200 mg Oral Daily  apixaban  2.5 mg Oral BID   Chlorhexidine Gluconate Cloth  6 each Topical Daily   midodrine  10 mg Oral TID WC   Continuous Infusions:  promethazine (PHENERGAN) injection (IM or IVPB)     PRN Meds: acetaminophen **OR** acetaminophen, antiseptic oral rinse, fentaNYL (SUBLIMAZE) injection, glycopyrrolate **OR** glycopyrrolate **OR** glycopyrrolate, haloperidol **OR** haloperidol **OR** haloperidol lactate, ondansetron **OR** ondansetron (ZOFRAN) IV, mouth rinse, oxyCODONE, polyvinyl alcohol, promethazine (PHENERGAN) injection (IM or IVPB)  Time spent: 55 minutes  Author: Gillis Santa. MD [facility] 11/17/2023 4:22 PM  To reach On-call, see care teams to locate the attending and reach out to them via [email or web address]. If  7PM-7AM, please contact night-coverage If you still have difficulty reaching the attending provider, please page the [facility] (Director on Call) for [facility] on amion for assistance.

## 2023-11-17 NOTE — Progress Notes (Signed)
PHARMACY - PHYSICIAN COMMUNICATION CRITICAL VALUE ALERT - BLOOD CULTURE IDENTIFICATION (BCID)  Tiffany [name] is an 89 y.o. female who presented to [facility] on 11/16/2023 with a chief complaint of sepsis and Pseudomonas bacteremia  Assessment:  2 of 4, both aerobic, Staph epidermidis with mecA/C resistance gene detected (MRSE)  Name of physician (or Provider) Contacted: Dr. Lucianne Muss  Current antibiotics: None - patient currently comfort measures only  Changes to prescribed antibiotics recommended:  No antibiotics for bacteremia  at this time (comfort measures)  Results for orders placed or performed during the hospital encounter of 11/16/23  Blood Culture ID Panel (Reflexed) (Collected: 11/16/2023  8:05 AM)  Result Value Ref Range   Enterococcus faecalis NOT DETECTED NOT DETECTED   Enterococcus Faecium NOT DETECTED NOT DETECTED   Listeria monocytogenes NOT DETECTED NOT DETECTED   Staphylococcus species DETECTED (A) NOT DETECTED   Staphylococcus aureus (BCID) NOT DETECTED NOT DETECTED   Staphylococcus epidermidis DETECTED (A) NOT DETECTED   Staphylococcus lugdunensis NOT DETECTED NOT DETECTED   Streptococcus species NOT DETECTED NOT DETECTED   Streptococcus agalactiae NOT DETECTED NOT DETECTED   Streptococcus pneumoniae NOT DETECTED NOT DETECTED   Streptococcus pyogenes NOT DETECTED NOT DETECTED   A.calcoaceticus-baumannii NOT DETECTED NOT DETECTED   Bacteroides fragilis NOT DETECTED NOT DETECTED   Enterobacterales NOT DETECTED NOT DETECTED   Enterobacter cloacae complex NOT DETECTED NOT DETECTED   Escherichia coli NOT DETECTED NOT DETECTED   Klebsiella aerogenes NOT DETECTED NOT DETECTED   Klebsiella oxytoca NOT DETECTED NOT DETECTED   Klebsiella pneumoniae NOT DETECTED NOT DETECTED   Proteus species NOT DETECTED NOT DETECTED   Salmonella species NOT DETECTED NOT DETECTED   Serratia marcescens NOT DETECTED NOT DETECTED   Haemophilus influenzae NOT DETECTED NOT DETECTED    Neisseria meningitidis NOT DETECTED NOT DETECTED   Pseudomonas aeruginosa NOT DETECTED NOT DETECTED   Stenotrophomonas maltophilia NOT DETECTED NOT DETECTED   Candida albicans NOT DETECTED NOT DETECTED   Candida auris NOT DETECTED NOT DETECTED   Candida glabrata NOT DETECTED NOT DETECTED   Candida krusei NOT DETECTED NOT DETECTED   Candida parapsilosis NOT DETECTED NOT DETECTED   Candida tropicalis NOT DETECTED NOT DETECTED   Cryptococcus neoformans/gattii NOT DETECTED NOT DETECTED   Methicillin resistance mecA/C DETECTED (A) NOT DETECTED    Orson Aloe 11/17/2023  6:17 PM

## 2023-11-17 NOTE — Progress Notes (Signed)
CBG 59, 4oz of juice drank by patient. CBG rechecked and is now 73.

## 2023-11-18 ENCOUNTER — Encounter (INDEPENDENT_AMBULATORY_CARE_PROVIDER_SITE_OTHER): Payer: Self-pay | Admitting: Nurse Practitioner

## 2023-11-18 DIAGNOSIS — Z7189 Other specified counseling: Secondary | ICD-10-CM | POA: Diagnosis not present

## 2023-11-18 DIAGNOSIS — A419 Sepsis, unspecified organism: Secondary | ICD-10-CM | POA: Diagnosis not present

## 2023-11-18 LAB — URINE CULTURE: Culture: >=100000 — AB

## 2023-11-18 LAB — CULTURE, BLOOD (ROUTINE X 2)

## 2023-11-18 NOTE — Discharge Summary (Signed)
[facility] Discharge Summary   Patient: Tiffany [name] WJX:[id number]  PCP: Lynnea Ferrier, MD  Date of admission: 11/16/2023   Date of discharge:  11/18/2023     Discharge Diagnoses:  Principal Problem:   Sepsis [facility]) Active Problems:   Cellulitis   UTI (urinary tract infection)   HFrEF (heart failure with reduced ejection fraction) (HCC)   ESRD (end stage renal disease) (HCC)   Atrial fibrillation, chronic (HCC)   Hypertensive heart and chronic kidney disease with heart failure and stage 1 through stage 4 chronic kidney disease, or unspecified chronic kidney disease (HCC)   Insulin dependent type 2 diabetes mellitus (HCC)   Encephalopathy   Admitted From: SNF Disposition:  Hospice  Recommendations for Outpatient Follow-up:  Follow hospice care Follow up LABS/TEST: None   Diet recommendation: Regular diet  Activity: The patient is advised to gradually reintroduce usual activities, as tolerated  Discharge Condition: stable  Code Status: DNR comfort care  History of present illness: As per the H and P dictated on admission Hospital Course:  Tiffany [name] is a 89 y.o. female with medical history significant of chronic HFrEF with LVEF 30-35%, CAD, CKD stage IV, PAF status post recent cardioversion on Xarelto, ureteral stone status post stenting April 2024, chronic back pain, IDDM, obesity presenting with sepsis, encephalopathy, UTI, cellulitis.  Per report, patient with worsening confusion and lethargy at local skilled nursing facility.  History primarily from patient's husband Dr. Nadine Counts who is a retired hospitalist/internist.  Noted have been recently admitted October 22 through October 31 for issues including CHF with concern for cardiorenal syndrome, recurrent right leg cellulitis with bacteremia.  Has had otherwise stable course from discharge.  On Monday Wednesday Friday schedule for dialysis.  No reported missed sessions.  No reported fevers or chills.  Right  lower extremity cellulitis has relatively improved though with chronic venous stasis changes.   ED w/up: Tmax 100.9, heart rate 100s, respirations 20s, blood pressure 80s to 100s over 40s to 60s.  Initially in the mid 80s on room air, transition to 3 L nasal cannula.  White count 14.8, hemoglobin 10.7, platelets 186, creatinine 3.16, glucose 229, lactate 2.9-2.3.  Urinalysis indicative of infection.  Chest x-ray within normal limits.  Assessment and Plan:   Comfort measure only Oral patient had poor prognosis, seen by palliative care and discussed with family, patient was transition to comfort measures only. Discontinued antibiotics Rest of the medications will be discontinued as per palliative care depending on the prognosis tomorrow. Continue as needed comfort care medications.     Sepsis, Pseudomonas bacteremia secondary to left lower extremity cellulitis and possible UTI.   S/p cefepime vancomycin, discontinued antibiotic due to comfort measures only status change Continue comfort measures as above # ESRD (end stage renal disease) Baseline ESRD on HD MWF with concern for cardiorenal syndrome  No reported missed HD sessions, Nephrology was consulted. # Metabolic encephalopathy secondary to sepsis: Continue supportive care # IDDM T2: Continue supportive care # paroxysmal A-fib, s/p cardioversion S/p amiodarone, Eliquis and Hypotension s/p midodrine   Body mass index is 36.21 kg/m.  Nutrition Interventions:  Pressure Injury 11/16/23 Heel Left Deep Tissue Pressure Injury - Purple or maroon localized area of discolored intact skin or blood-filled blister due to damage of underlying soft tissue from pressure and/or shear. (Active)  11/16/23 1500  Location: Heel  Location Orientation: Left  Staging: Deep Tissue Pressure Injury - Purple or maroon localized area of discolored intact skin or blood-filled blister  due to damage of underlying soft tissue from pressure and/or shear.  Wound  Description (Comments):   Present on Admission: Yes  Dressing Type Foam - Lift dressing to assess site every shift 11/17/23 2029     Pressure Injury 11/16/23 Tibial Left;Posterior Deep Tissue Pressure Injury - Purple or maroon localized area of discolored intact skin or blood-filled blister due to damage of underlying soft tissue from pressure and/or shear. (Active)  11/16/23 1500  Location: Tibial  Location Orientation: Left;Posterior  Staging: Deep Tissue Pressure Injury - Purple or maroon localized area of discolored intact skin or blood-filled blister due to damage of underlying soft tissue from pressure and/or shear.  Wound Description (Comments):   Present on Admission:   Dressing Type Foam - Lift dressing to assess site every shift 11/17/23 2029     Pressure Injury 11/16/23 Tibial Posterior;Right Deep Tissue Pressure Injury - Purple or maroon localized area of discolored intact skin or blood-filled blister due to damage of underlying soft tissue from pressure and/or shear. (Active)  11/16/23 1500  Location: Tibial  Location Orientation: Posterior;Right  Staging: Deep Tissue Pressure Injury - Purple or maroon localized area of discolored intact skin or blood-filled blister due to damage of underlying soft tissue from pressure and/or shear.  Wound Description (Comments):   Present on Admission:   Dressing Type Foam - Lift dressing to assess site every shift 11/17/23 2029    On the day of the discharge the patient's condition is appropriate to discharge under hospice services today.   Consultants: Nephrology, ID, palliative care Procedures: None  Discharge Exam: General: Appear in no distress, Oral Mucosa Clear, but dry  Cardiovascular: S1 and S2 Present, no Murmur, Respiratory: normal respiratory effort, Bilateral Air entry present and no Crackles, no wheezes Abdomen: Bowel Sound present, Soft and no tenderness,  Extremities: Chronic lymphedema, left lower extremity erythema and  tenderness Neurology: CN grossly intact, no focal deficits.   Filed Weights   11/16/23 0756  Weight: 89.8 kg   Vitals:   11/17/23 1802 11/18/23 0840  BP:  (!) 109/50  Pulse:  70  Resp:  18  Temp: (!) 102 F (38.9 C) 97.6 F (36.4 C)  SpO2:  97%    DISCHARGE MEDICATION: Allergies as of 11/18/2023       Reactions   Amlodipine Swelling   Codeine Nausea And Vomiting   Tolerates low doses hydrocodone   Sacubitril-valsartan Other (See Comments)   Acute kidney injury   Tizanidine    Didn't feel good taking it   Doxycycline Nausea And Vomiting        Medication List     STOP taking these medications    (feeding supplement) PROSource Plus liquid   allopurinol 100 MG tablet Commonly known as: ZYLOPRIM   amiodarone 200 MG tablet Commonly known as: PACERONE   apixaban 2.5 MG Tabs tablet Commonly known as: ELIQUIS   BD Pen Needle Nano U/F 32G X 4 MM Misc Generic drug: Insulin Pen Needle   cephALEXin 500 MG capsule Commonly known as: KEFLEX   HYDROcodone-acetaminophen 5-325 MG tablet Commonly known as: NORCO/VICODIN   insulin aspart 100 UNIT/ML injection Commonly known as: novoLOG   insulin glargine-yfgn 100 UNIT/ML injection Commonly known as: SEMGLEE   linagliptin 5 MG Tabs tablet Commonly known as: TRADJENTA   metoprolol succinate 25 MG 24 hr tablet Commonly known as: TOPROL-XL   midodrine 10 MG tablet Commonly known as: PROAMATINE   multivitamin Tabs tablet   ondansetron 4 MG tablet Commonly known  as: ZOFRAN   Precision QID Test test strip Generic drug: glucose blood   ProAir RespiClick 108 (90 Base) MCG/ACT Aepb Generic drug: Albuterol Sulfate   torsemide 20 MG tablet Commonly known as: DEMADEX   Vitamin D (Ergocalciferol) 1.25 MG (50000 UNIT) Caps capsule Commonly known as: DRISDOL       TAKE these medications    acetaminophen 500 MG tablet Commonly known as: TYLENOL Take 1 tablet (500 mg total) by mouth every 8 (eight) hours  as needed for mild pain (pain score 1-3). What changed: when to take this               Discharge Care Instructions  (From admission, onward)           Start     Ordered   11/18/23 0000  Discharge wound care:       Comments: As above   11/18/23 1224           Allergies  Allergen Reactions   Amlodipine Swelling   Codeine Nausea And Vomiting    Tolerates low doses hydrocodone   Sacubitril-Valsartan Other (See Comments)    Acute kidney injury   Tizanidine     Didn't feel good taking it   Doxycycline Nausea And Vomiting   Discharge Instructions     Diet general   Complete by: As directed    Discharge instructions   Complete by: As directed    Follow hospice care   Discharge wound care:   Complete by: As directed    As above   Increase activity slowly   Complete by: As directed        The results of significant diagnostics from this hospitalization (including imaging, microbiology, ancillary and laboratory) are listed below for reference.    Significant Diagnostic Studies: DG Chest Port 1 View  Result Date: 11/16/2023 CLINICAL DATA:  Questionable sepsis - evaluate for abnormality EXAM: PORTABLE CHEST 1 VIEW COMPARISON:  None Available. FINDINGS: Tunneled hemodialysis catheter with tip terminating in the right atrium. Low lung volumes. The cardiomediastinal silhouette is within normal limits. No pleural effusion. No pneumothorax. No mass or consolidation. No acute osseous abnormality. IMPRESSION: No acute findings in the chest. Electronically Signed   By: Olive Bass M.D.   On: 11/16/2023 13:13   ECHOCARDIOGRAM COMPLETE  Result Date: 10/24/2023    ECHOCARDIOGRAM REPORT   Patient Name:   Tiffany [name] Date of Exam: 10/24/2023 Medical Rec #:  [id number]        Height:       63.0 in Accession #:    [id number]       Weight:       188.7 lb Date of Birth:  1933-08-08        BSA:          1.886 m Patient Age:    89 years         BP:           132/65 mmHg  Patient Gender: F                HR:           72 bpm. Exam Location:  [facility] Procedure: 2D Echo Indications:     CHF- Acute Systolic I50.21  History:         Patient has prior history of Echocardiogram examinations.  Sonographer:     Elwin Sleight RDCS Referring Phys:  [id number] Loyce Dys Diagnosing Phys: Julien Nordmann MD  Sonographer Comments:  Suboptimal parasternal window, suboptimal apical window and suboptimal subcostal window. Image acquisition challenging due to patient body habitus. IMPRESSIONS  1. Left ventricular ejection fraction, by estimation, is 40 to 45%. Left ventricular ejection fraction by PLAX is 42 %. The left ventricle has mildly decreased function. The left ventricle demonstrates global hypokinesis. Left ventricular diastolic parameters are consistent with Grade I diastolic dysfunction (impaired relaxation).  2. Right ventricular systolic function is normal. The right ventricular size is normal. There is moderately elevated pulmonary artery systolic pressure. The estimated right ventricular systolic pressure is 54.6 mmHg.  3. The mitral valve is normal in structure. Mild mitral valve regurgitation. No evidence of mitral stenosis.  4. The aortic valve has an indeterminant number of cusps. Aortic valve regurgitation is not visualized. Aortic valve sclerosis is present, with no evidence of aortic valve stenosis.  5. The inferior vena cava is normal in size with greater than 50% respiratory variability, suggesting right atrial pressure of 3 mmHg. FINDINGS  Left Ventricle: Left ventricular ejection fraction, by estimation, is 40 to 45%. Left ventricular ejection fraction by PLAX is 42 %. The left ventricle has mildly decreased function. The left ventricle demonstrates global hypokinesis. The left ventricular internal cavity size was normal in size. There is no left ventricular hypertrophy. Left ventricular diastolic parameters are consistent with Grade I diastolic dysfunction (impaired relaxation).  Right Ventricle: The right ventricular size is normal. No increase in right ventricular wall thickness. Right ventricular systolic function is normal. There is moderately elevated pulmonary artery systolic pressure. The tricuspid regurgitant velocity is 3.52 m/s, and with an assumed right atrial pressure of 5 mmHg, the estimated right ventricular systolic pressure is 54.6 mmHg. Left Atrium: Left atrial size was normal in size. Right Atrium: Right atrial size was normal in size. Pericardium: There is no evidence of pericardial effusion. Mitral Valve: The mitral valve is normal in structure. Mild mitral valve regurgitation. No evidence of mitral valve stenosis. Tricuspid Valve: The tricuspid valve is normal in structure. Tricuspid valve regurgitation is mild . No evidence of tricuspid stenosis. Aortic Valve: The aortic valve has an indeterminant number of cusps. Aortic valve regurgitation is not visualized. Aortic valve sclerosis is present, with no evidence of aortic valve stenosis. Aortic valve peak gradient measures 8.4 mmHg. Pulmonic Valve: The pulmonic valve was normal in structure. Pulmonic valve regurgitation is not visualized. No evidence of pulmonic stenosis. Aorta: The aortic root is normal in size and structure. Venous: The inferior vena cava is normal in size with greater than 50% respiratory variability, suggesting right atrial pressure of 3 mmHg. IAS/Shunts: No atrial level shunt detected by color flow Doppler.  LEFT VENTRICLE PLAX 2D LV EF:         Left            Diastology                ventricular     LV e' medial:    4.24 cm/s                ejection        LV E/e' medial:  20.1                fraction by     LV e' lateral:   7.83 cm/s                PLAX is 42      LV E/e' lateral: 10.9                %.  LVIDd:         3.75 cm LVIDs:         3.00 cm LV PW:         1.25 cm LV IVS:        1.25 cm LVOT diam:     1.90 cm LV SV:         52 LV SV Index:   28 LVOT Area:     2.84 cm  LV Volumes (MOD) LV  vol d, MOD    79.9 ml A4C: LV SV MOD A4C:   79.9 ml RIGHT VENTRICLE RV Basal diam:  3.30 cm RV S prime:     17.00 cm/s TAPSE (M-mode): 2.2 cm LEFT ATRIUM             Index        RIGHT ATRIUM           Index LA diam:        3.40 cm 1.80 cm/m   RA Area:     12.30 cm LA Vol (A2C):   48.4 ml 25.66 ml/m  RA Volume:   26.20 ml  13.89 ml/m LA Vol (A4C):   48.4 ml 25.66 ml/m LA Biplane Vol: 48.7 ml 25.82 ml/m  AORTIC VALVE                 PULMONIC VALVE AV Area (Vmax): 1.98 cm     PV Vmax:        1.44 m/s AV Vmax:        144.50 cm/s  PV Peak grad:   8.2 mmHg AV Peak Grad:   8.4 mmHg     RVOT Peak grad: 4 mmHg LVOT Vmax:      101.00 cm/s LVOT Vmean:     59.100 cm/s LVOT VTI:       0.185 m  AORTA Ao Root diam: 2.50 cm Ao Asc diam:  3.60 cm MITRAL VALVE               TRICUSPID VALVE MV Area (PHT): 3.89 cm    TR Peak grad:   49.6 mmHg MV Decel Time: 195 msec    TR Vmax:        352.00 cm/s MV E velocity: 85.40 cm/s MV A velocity: 93.10 cm/s  SHUNTS MV E/A ratio:  0.92        Systemic VTI:  0.18 m                            Systemic Diam: 1.90 cm Julien Nordmann MD Electronically signed by Julien Nordmann MD Signature Date/Time: 10/24/2023/9:32:01 AM    Final    MR ABDOMEN W WO CONTRAST  Result Date: 10/22/2023 CLINICAL DATA:  Characterize left renal mass identified by prior ultrasound EXAM: MRI ABDOMEN WITHOUT AND WITH CONTRAST TECHNIQUE: Multiplanar multisequence MR imaging of the abdomen was performed both before and after the administration of intravenous contrast. CONTRAST:  7.29mL GADAVIST GADOBUTROL 1 MMOL/ML IV SOLN COMPARISON:  Renal ultrasound, 10/19/2023, CT abdomen pelvis, 11/05/2021 FINDINGS: Examination is generally limited by breath motion artifact, particularly multiphasic contrast enhanced sequences. Lower chest: No acute abnormality. Hepatobiliary: No solid liver abnormality is seen. No gallstones, gallbladder wall thickening, or biliary dilatation. Pancreas: Severely atrophic pancreas. Numerous fluid  signal cystic lesions scattered throughout the pancreas, largest in the pancreatic neck measuring 1.2 x 0.9 cm (series 4, image 20). Appearance not obviously changed compared to prior noncontrast examination dated 11/05/2021.  No solid component or suspicious contrast enhancement. No acute inflammatory findings. Spleen: Normal in size without significant abnormality. Adrenals/Urinary Tract: Adrenal glands are unremarkable. Arterially enhancing exophytic mass arising from the peripheral inferior pole of the left kidney measuring 1.7 x 1.4 cm (series 15, image 63). Additional benign fluid signal renal cortical and parapelvic cysts, for which no specific further follow-up or characterization is required. No obvious calculi or hydronephrosis. Stomach/Bowel: Stomach is within normal limits. No evidence of bowel wall thickening, distention, or inflammatory changes. Vascular/Lymphatic: Aortic atherosclerosis. No enlarged abdominal lymph nodes. Other: No abdominal wall hernia or abnormality. No ascites. Musculoskeletal: No acute or significant osseous findings. IMPRESSION: 1. Examination is generally limited by breath motion artifact, particularly multiphasic contrast enhanced sequences. 2. Within this limitation, arterially enhancing exophytic mass arising from the peripheral inferior pole of the left kidney measuring 1.7 x 1.4 cm, consistent with a small renal cell carcinoma. 3. No evidence of obvious renal vein invasion, lymphadenopathy, or metastatic disease in the abdomen. 4. Numerous fluid signal cystic lesions scattered throughout the pancreas, largest in the pancreatic neck measuring 1.2 x 0.9 cm. Appearance not obviously changed compared to prior noncontrast examination dated 11/05/2021. No solid component or suspicious contrast enhancement. 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 As there is no observed increased risk of malignancy for such lesions smaller than 2 cm, especially given evidence of other  primary malignancy, initially established imaging stability and advanced patient age, no specific further follow-up or characterization is required. Aortic Atherosclerosis (ICD10-I70.0). Electronically Signed   By: Jearld Lesch M.D.   On: 10/22/2023 21:35   PERIPHERAL VASCULAR CATHETERIZATION  Result Date: 10/22/2023 See surgical note for result.  US RENAL  Result Date: 10/19/2023 CLINICAL DATA:  Acute renal injury EXAM: RENAL / URINARY TRACT ULTRASOUND COMPLETE COMPARISON:  03/10/2023 FINDINGS: Right Kidney: Renal measurements: 10.2 x 5.2 x 4.9 cm. = volume: 134 mL. Moderate hydronephrosis is noted. This is new from the prior exam. Left Kidney: Renal measurements: 11.4 x 4.7 x 5.1 cm. = volume: 143 mL. Moderate hydronephrosis is noted similar to that seen CT. Stable 1.9 cm echogenic mass is noted in the midportion of the left kidney. Bladder: Stent is noted within the bladder. The proximal aspect is not well appreciated on this exam. Other: None. IMPRESSION: Moderate hydronephrosis increased on the right when compared with the prior exam and stable on the left. Left ureteral stent. Only the distal aspect of the stent is visualized. Echogenic mass within the left kidney similar to that noted on prior plain film examination. Nonemergent MRI is again recommended for further evaluation. Electronically Signed   By: Alcide Clever M.D.   On: 10/19/2023 20:36   DG Tibia/Fibula Right  Result Date: 10/19/2023 CLINICAL DATA:  Cellulitis.  Bilateral swelling and edema. EXAM: RIGHT TIBIA AND FIBULA - 2 VIEW COMPARISON:  None Available. FINDINGS: There is diffuse moderate subcutaneous fat edema and swelling. Additional large patient body habitus. Mildly decreased bone mineralization. Severe medial compartment of the knee joint space narrowing with mild peripheral osteophytosis. Moderate to severe patellofemoral joint space narrowing with moderate inferior and mild superior patellar degenerative osteophytes. Mild  tibiotalar joint space narrowing. No acute fracture or dislocation. Moderate tarsometatarsal joint space narrowing on lateral view. Moderate atherosclerotic calcifications. IMPRESSION: 1. Diffuse moderate subcutaneous fat edema and swelling. 2. Severe medial compartment and moderate to severe patellofemoral compartment osteoarthritis of the right knee. Electronically Signed   By: Neita Garnet M.D.   On: 10/19/2023 13:56  DG Tibia/Fibula Left  Result Date: 10/19/2023 CLINICAL DATA:  Bilateral leg swelling and edema.  Cellulitis. EXAM: LEFT TIBIA AND FIBULA - 2 VIEW COMPARISON:  Left knee radiographs 05/23/2019 FINDINGS: Status post total left knee arthroplasty. Interval removal of the prior anterior surgical skin staples on the prior radiographs immediately following surgery. Resolution of the prior postsurgical intra-articular air of the left knee. No left knee joint effusion. No perihardware lucency is seen to indicate hardware failure or loosening. No acute fracture or dislocation. Mild tibiotalar osteoarthritis. Moderate tarsometatarsal joint space narrowing, subchondral sclerosis, subchondral cystic change, peripheral osteophytosis diffusely. Moderate diffuse subcutaneous fat edema and swelling. Moderate atherosclerotic calcifications. IMPRESSION: 1. Status post total left knee arthroplasty without evidence of hardware failure or loosening. 2. Moderate partially visualized tarsometatarsal osteoarthritis. 3. Moderate diffuse subcutaneous fat edema and swelling consistent with reported cellulitis. Electronically Signed   By: Neita Garnet M.D.   On: 10/19/2023 13:55    Microbiology: Recent Results (from the past 240 hour(s))  Blood Culture (routine x 2)     Status: Abnormal (Preliminary result)   Collection Time: 11/16/23  8:05 AM   Specimen: BLOOD  Result Value Ref Range Status   Specimen Description   Final    BLOOD RIGHT UPPER ARM Performed at [facility], [address]  [place], [place] [address]    Special Requests   Final    BOTTLES DRAWN AEROBIC AND ANAEROBIC Performed at [facility], [address] [place], [place] [address]    Culture  Setup Time   Final    GRAM POSITIVE COCCI AEROBIC BOTTLE ONLY Organism ID to follow CRITICAL RESULT CALLED TO, READ BACK BY AND VERIFIED WITH: WILL ANDERSON [id number] 11/17/23 MU GRAM STAIN REVIEWED-AGREE WITH RESULT DRT    Culture (A)  Final    STAPHYLOCOCCUS EPIDERMIDIS THE SIGNIFICANCE OF ISOLATING THIS ORGANISM FROM A SINGLE SET OF BLOOD CULTURES WHEN MULTIPLE SETS ARE DRAWN IS UNCERTAIN. PLEASE NOTIFY THE MICROBIOLOGY DEPARTMENT WITHIN ONE WEEK IF SPECIATION AND SENSITIVITIES ARE REQUIRED. Performed at [facility], [address], [place], [place] [address]    Report Status PENDING  Incomplete  Blood Culture (routine x 2)     Status: Abnormal (Preliminary result)   Collection Time: 11/16/23  8:05 AM   Specimen: BLOOD  Result Value Ref Range Status   Specimen Description   Final    BLOOD LEFT WRIST Performed at [facility], [address] [place], [place] [address]    Special Requests   Final    BOTTLES DRAWN AEROBIC AND ANAEROBIC Blood Culture adequate volume Performed at [facility], [address] [place], [place] [address]    Culture  Setup Time   Final    Organism ID to follow GRAM NEGATIVE RODS AEROBIC BOTTLE ONLY CRITICAL RESULT CALLED TO, READ BACK BY AND VERIFIED WITH: JASON ROBBINS PHARMD @0307  11/17/23 ASW Performed at [facility], [address], [place], [place] [address]    Culture (A)  Final    PSEUDOMONAS AERUGINOSA SUSCEPTIBILITIES TO FOLLOW Performed at [facility], [address], [place], [place] [address]    Report Status PENDING  Incomplete  Urine Culture     Status: Abnormal   Collection Time: 11/16/23  8:05 AM   Specimen: Urine, Random  Result Value Ref Range Status   Specimen Description   Final    URINE,  RANDOM Performed at [facility], [address] [place], [place] [address]  Special Requests   Final    NONE Reflexed from [id number] Performed at [facility], [address] [place], [place] [address]    Culture >=100,000 COLONIES/mL PSEUDOMONAS AERUGINOSA (A)  Final   Report Status 11/18/2023 FINAL  Final   Organism ID, Bacteria PSEUDOMONAS AERUGINOSA (A)  Final      Susceptibility   Pseudomonas aeruginosa - MIC*    CEFTAZIDIME 4 SENSITIVE Sensitive     CIPROFLOXACIN 0.5 SENSITIVE Sensitive     GENTAMICIN <=1 SENSITIVE Sensitive     IMIPENEM 2 SENSITIVE Sensitive     * >=100,000 COLONIES/mL PSEUDOMONAS AERUGINOSA  Blood Culture ID Panel (Reflexed)     Status: Abnormal   Collection Time: 11/16/23  8:05 AM  Result Value Ref Range Status   Enterococcus faecalis NOT DETECTED NOT DETECTED Final   Enterococcus Faecium NOT DETECTED NOT DETECTED Final   Listeria monocytogenes NOT DETECTED NOT DETECTED Final   Staphylococcus species NOT DETECTED NOT DETECTED Final   Staphylococcus aureus (BCID) NOT DETECTED NOT DETECTED Final   Staphylococcus epidermidis NOT DETECTED NOT DETECTED Final   Staphylococcus lugdunensis NOT DETECTED NOT DETECTED Final   Streptococcus species NOT DETECTED NOT DETECTED Final   Streptococcus agalactiae NOT DETECTED NOT DETECTED Final   Streptococcus pneumoniae NOT DETECTED NOT DETECTED Final   Streptococcus pyogenes NOT DETECTED NOT DETECTED Final   A.calcoaceticus-baumannii NOT DETECTED NOT DETECTED Final   Bacteroides fragilis NOT DETECTED NOT DETECTED Final   Enterobacterales NOT DETECTED NOT DETECTED Final   Enterobacter cloacae complex NOT DETECTED NOT DETECTED Final   Escherichia coli NOT DETECTED NOT DETECTED Final   Klebsiella aerogenes NOT DETECTED NOT DETECTED Final   Klebsiella oxytoca NOT DETECTED NOT DETECTED Final   Klebsiella pneumoniae NOT DETECTED NOT DETECTED Final   Proteus species NOT DETECTED NOT DETECTED Final    Salmonella species NOT DETECTED NOT DETECTED Final   Serratia marcescens NOT DETECTED NOT DETECTED Final   Haemophilus influenzae NOT DETECTED NOT DETECTED Final   Neisseria meningitidis NOT DETECTED NOT DETECTED Final   Pseudomonas aeruginosa DETECTED (A) NOT DETECTED Final    Comment: CRITICAL RESULT CALLED TO, READ BACK BY AND VERIFIED WITH: JASON ROBBINS PHARMD @0307  11/17/23 ASW    Stenotrophomonas maltophilia NOT DETECTED NOT DETECTED Final   Candida albicans NOT DETECTED NOT DETECTED Final   Candida auris NOT DETECTED NOT DETECTED Final   Candida glabrata NOT DETECTED NOT DETECTED Final   Candida krusei NOT DETECTED NOT DETECTED Final   Candida parapsilosis NOT DETECTED NOT DETECTED Final   Candida tropicalis NOT DETECTED NOT DETECTED Final   Cryptococcus neoformans/gattii NOT DETECTED NOT DETECTED Final   CTX-M ESBL NOT DETECTED NOT DETECTED Final   Carbapenem resistance IMP NOT DETECTED NOT DETECTED Final   Carbapenem resistance KPC NOT DETECTED NOT DETECTED Final   Carbapenem resistance NDM NOT DETECTED NOT DETECTED Final   Carbapenem resistance VIM NOT DETECTED NOT DETECTED Final    Comment: Performed at [facility], [address] [place], [place] [address]  Blood Culture ID Panel (Reflexed)     Status: Abnormal   Collection Time: 11/16/23  8:05 AM  Result Value Ref Range Status   Enterococcus faecalis NOT DETECTED NOT DETECTED Final   Enterococcus Faecium NOT DETECTED NOT DETECTED Final   Listeria monocytogenes NOT DETECTED NOT DETECTED Final   Staphylococcus species DETECTED (A) NOT DETECTED Final    Comment: CRITICAL RESULT CALLED TO, READ BACK BY AND VERIFIED WITH: WILL ANDERSON 1733 11/17/23 MU    Staphylococcus  aureus (BCID) NOT DETECTED NOT DETECTED Final   Staphylococcus epidermidis DETECTED (A) NOT DETECTED Final    Comment: Methicillin (oxacillin) resistant coagulase negative staphylococcus. Possible blood culture contaminant (unless isolated from  more than one blood culture draw or clinical case suggests pathogenicity). No antibiotic treatment is indicated for blood  culture contaminants. CRITICAL RESULT CALLED TO, READ BACK BY AND VERIFIED WITH: WILL ANDERSON 1733 11/17/23 MU    Staphylococcus lugdunensis NOT DETECTED NOT DETECTED Final   Streptococcus species NOT DETECTED NOT DETECTED Final   Streptococcus agalactiae NOT DETECTED NOT DETECTED Final   Streptococcus pneumoniae NOT DETECTED NOT DETECTED Final   Streptococcus pyogenes NOT DETECTED NOT DETECTED Final   A.calcoaceticus-baumannii NOT DETECTED NOT DETECTED Final   Bacteroides fragilis NOT DETECTED NOT DETECTED Final   Enterobacterales NOT DETECTED NOT DETECTED Final   Enterobacter cloacae complex NOT DETECTED NOT DETECTED Final   Escherichia coli NOT DETECTED NOT DETECTED Final   Klebsiella aerogenes NOT DETECTED NOT DETECTED Final   Klebsiella oxytoca NOT DETECTED NOT DETECTED Final   Klebsiella pneumoniae NOT DETECTED NOT DETECTED Final   Proteus species NOT DETECTED NOT DETECTED Final   Salmonella species NOT DETECTED NOT DETECTED Final   Serratia marcescens NOT DETECTED NOT DETECTED Final   Haemophilus influenzae NOT DETECTED NOT DETECTED Final   Neisseria meningitidis NOT DETECTED NOT DETECTED Final   Pseudomonas aeruginosa NOT DETECTED NOT DETECTED Final   Stenotrophomonas maltophilia NOT DETECTED NOT DETECTED Final   Candida albicans NOT DETECTED NOT DETECTED Final   Candida auris NOT DETECTED NOT DETECTED Final   Candida glabrata NOT DETECTED NOT DETECTED Final   Candida krusei NOT DETECTED NOT DETECTED Final   Candida parapsilosis NOT DETECTED NOT DETECTED Final   Candida tropicalis NOT DETECTED NOT DETECTED Final   Cryptococcus neoformans/gattii NOT DETECTED NOT DETECTED Final   Methicillin resistance mecA/C DETECTED (A) NOT DETECTED Final    Comment: CRITICAL RESULT CALLED TO, READ BACK BY AND VERIFIED WITH: WILL ANDERSON 1733 11/17/23 MU Performed at  [facility], [address] [place], [place] [address]      Labs: CBC: Recent Labs  Lab 11/16/23 0805 11/17/23 0302  WBC 14.8* 18.3*  NEUTROABS 11.6*  --   HGB 10.7* 8.5*  HCT 34.4* 26.2*  MCV 88.0 85.6  PLT 186 138*   Basic Metabolic Panel: Recent Labs  Lab 11/16/23 0805 11/17/23 0302 11/17/23 0757  NA 130* 130*  --   K 3.4* 3.1*  --   CL 89* 96*  --   CO2 26 25  --   GLUCOSE 229* 124*  --   BUN 29* 35*  --   CREATININE 3.16* 3.31*  --   CALCIUM 8.3* 8.0*  --   MG  --   --  1.8  PHOS  --   --  4.2   Liver Function Tests: Recent Labs  Lab 11/16/23 0805 11/17/23 0302  AST 38 103*  ALT 28 60*  ALKPHOS 81 56  BILITOT 1.0 1.1  PROT 5.9* 4.6*  ALBUMIN 2.8* 2.0*   No results for input(s): "LIPASE", "AMYLASE" in the last 168 hours. No results for input(s): "AMMONIA" in the last 168 hours. Cardiac Enzymes: No results for input(s): "CKTOTAL", "CKMB", "CKMBINDEX", "TROPONINI" in the last 168 hours. BNP (last 3 results) No results for input(s): "BNP" in the last 8760 hours. CBG: Recent Labs  Lab 11/16/23 1525 11/17/23 0735 11/17/23 0803 11/17/23 1153 11/17/23 1154  GLUCAP 167* 59* 73 50* 53*    Time  spent: 35 minutes  Signed:  Gillis Santa  [facility] 11/18/2023 12:25 PM

## 2023-11-18 NOTE — Progress Notes (Signed)
[facility]  ROUNDING NOTE   Subjective:   Patient well-known to [place] as we follow her for outpatient hemodialysis. Patient recently started on dialysis for ESRD. She was brought in from nursing facility for increasing confusion and lethargy.  Patient presented with a temperature of 100.9 with a heart rate in the 100s. Patient's husband was at the bedside in the intensive care unit when seen shortly after admission. He states he has spoken with the patient and the family about dialysis and they would like to hold off on further dialysis treatments.  Update: Patient resting comfortably earlier in the day. Son was at bedside at that time. Pain control appears reasonable.   Objective:  Vital signs in last 24 hours:  Temp:  [97.6 F (36.4 C)-102 F (38.9 C)] 97.6 F (36.4 C) (11/21 0840) Pulse Rate:  [70-98] 70 (11/21 0840) Resp:  [18-20] 18 (11/21 0840) BP: (109)/(50) 109/50 (11/21 0840) SpO2:  [95 %-97 %] 97 % (11/21 0840)  Weight change:  Filed Weights   11/16/23 0756  Weight: 89.8 kg    Intake/Output: I/O last 3 completed shifts: In: 376 [P.O.:210; I.V.:146; Other:20] Out: 200 [Urine:200]   Intake/Output this shift:  No intake/output data recorded.  Physical Exam: General: Chronically ill-appearing  Head: Normocephalic, atraumatic. Moist oral mucosal membranes  Eyes: Anicteric  Lungs:  Basilar rales  Heart: S1S2 no rubs  Abdomen:  Soft, nontender  Extremities: 2+ bilateral lower extremity edema with significant erythema and left lower extremity  Neurologic: Alert, moving all four extremities  Skin: Visible erythema in the left lower extremity, right lower extremity covered  Access:  Rt Chest permcath placed on 10/22/23    Basic Metabolic Panel: Recent Labs  Lab 11/16/23 0805 11/17/23 0302 11/17/23 0757  NA 130* 130*  --   K 3.4* 3.1*  --   CL 89* 96*  --   CO2 26 25  --   GLUCOSE 229* 124*  --   BUN 29* 35*  --   CREATININE 3.16* 3.31*  --    CALCIUM 8.3* 8.0*  --   MG  --   --  1.8  PHOS  --   --  4.2    Liver Function Tests: Recent Labs  Lab 11/16/23 0805 11/17/23 0302  AST 38 103*  ALT 28 60*  ALKPHOS 81 56  BILITOT 1.0 1.1  PROT 5.9* 4.6*  ALBUMIN 2.8* 2.0*   No results for input(s): "LIPASE", "AMYLASE" in the last 168 hours. No results for input(s): "AMMONIA" in the last 168 hours.  CBC: Recent Labs  Lab 11/16/23 0805 11/17/23 0302  WBC 14.8* 18.3*  NEUTROABS 11.6*  --   HGB 10.7* 8.5*  HCT 34.4* 26.2*  MCV 88.0 85.6  PLT 186 138*    Cardiac Enzymes: No results for input(s): "CKTOTAL", "CKMB", "CKMBINDEX", "TROPONINI" in the last 168 hours.   BNP: Invalid input(s): "POCBNP"  CBG: Recent Labs  Lab 11/16/23 1525 11/17/23 0735 11/17/23 0803 11/17/23 1153 11/17/23 1154  GLUCAP 167* 59* 73 50* 53*    Microbiology: Results for orders placed or performed during the hospital encounter of 11/16/23  Blood Culture (routine x 2)     Status: Abnormal   Collection Time: 11/16/23  8:05 AM   Specimen: BLOOD  Result Value Ref Range Status   Specimen Description   Final    BLOOD RIGHT UPPER ARM Performed at [facility], [address], [place], [place] [address]    Special Requests  Final    BOTTLES DRAWN AEROBIC AND ANAEROBIC Performed at [facility], [address] [place], [place] [address]    Culture  Setup Time   Final    GRAM POSITIVE COCCI AEROBIC BOTTLE ONLY Organism ID to follow CRITICAL RESULT CALLED TO, READ BACK BY AND VERIFIED WITH: WILL ANDERSON 1733 11/17/23 MU GRAM STAIN REVIEWED-AGREE WITH RESULT DRT    Culture (A)  Final    STAPHYLOCOCCUS EPIDERMIDIS THE SIGNIFICANCE OF ISOLATING THIS ORGANISM FROM A SINGLE SET OF BLOOD CULTURES WHEN MULTIPLE SETS ARE DRAWN IS UNCERTAIN. PLEASE NOTIFY THE MICROBIOLOGY DEPARTMENT WITHIN ONE WEEK IF SPECIATION AND SENSITIVITIES ARE REQUIRED. Performed at [facility], [address] [place], [place] [address]     Report Status 11/18/2023 FINAL  Final  Blood Culture (routine x 2)     Status: Abnormal (Preliminary result)   Collection Time: 11/16/23  8:05 AM   Specimen: BLOOD  Result Value Ref Range Status   Specimen Description   Final    BLOOD LEFT WRIST Performed at [facility], [address] [place], [place] [address]    Special Requests   Final    BOTTLES DRAWN AEROBIC AND ANAEROBIC Blood Culture adequate volume Performed at [facility], [address] [place], [place] [address]    Culture  Setup Time   Final    Organism ID to follow GRAM NEGATIVE RODS AEROBIC BOTTLE ONLY CRITICAL RESULT CALLED TO, READ BACK BY AND VERIFIED WITH: JASON ROBBINS PHARMD @0307  11/17/23 ASW Performed at [facility], [address], [place], [place] [address]    Culture (A)  Final    PSEUDOMONAS AERUGINOSA SUSCEPTIBILITIES TO FOLLOW Performed at [facility], [address] [place], [place] [address]    Report Status PENDING  Incomplete  Urine Culture     Status: Abnormal   Collection Time: 11/16/23  8:05 AM   Specimen: Urine, Random  Result Value Ref Range Status   Specimen Description   Final    URINE, RANDOM Performed at [facility], [address] [place], [place] [address]    Special Requests   Final    NONE Reflexed from [id number] Performed at [facility], [address] [place], [place] [address]    Culture >=100,000 COLONIES/mL PSEUDOMONAS AERUGINOSA (A)  Final   Report Status 11/18/2023 FINAL  Final   Organism ID, Bacteria PSEUDOMONAS AERUGINOSA (A)  Final      Susceptibility   Pseudomonas aeruginosa - MIC*    CEFTAZIDIME 4 SENSITIVE Sensitive     CIPROFLOXACIN 0.5 SENSITIVE Sensitive     GENTAMICIN <=1 SENSITIVE Sensitive     IMIPENEM 2 SENSITIVE Sensitive     * >=100,000 COLONIES/mL PSEUDOMONAS AERUGINOSA  Blood Culture ID Panel (Reflexed)     Status: Abnormal   Collection Time: 11/16/23  8:05 AM  Result Value Ref Range  Status   Enterococcus faecalis NOT DETECTED NOT DETECTED Final   Enterococcus Faecium NOT DETECTED NOT DETECTED Final   Listeria monocytogenes NOT DETECTED NOT DETECTED Final   Staphylococcus species NOT DETECTED NOT DETECTED Final   Staphylococcus aureus (BCID) NOT DETECTED NOT DETECTED Final   Staphylococcus epidermidis NOT DETECTED NOT DETECTED Final   Staphylococcus lugdunensis NOT DETECTED NOT DETECTED Final   Streptococcus species NOT DETECTED NOT DETECTED Final   Streptococcus agalactiae NOT DETECTED NOT DETECTED Final   Streptococcus pneumoniae NOT DETECTED NOT DETECTED Final   Streptococcus pyogenes NOT DETECTED NOT DETECTED Final   A.calcoaceticus-baumannii  NOT DETECTED NOT DETECTED Final   Bacteroides fragilis NOT DETECTED NOT DETECTED Final   Enterobacterales NOT DETECTED NOT DETECTED Final   Enterobacter cloacae complex NOT DETECTED NOT DETECTED Final   Escherichia coli NOT DETECTED NOT DETECTED Final   Klebsiella aerogenes NOT DETECTED NOT DETECTED Final   Klebsiella oxytoca NOT DETECTED NOT DETECTED Final   Klebsiella pneumoniae NOT DETECTED NOT DETECTED Final   Proteus species NOT DETECTED NOT DETECTED Final   Salmonella species NOT DETECTED NOT DETECTED Final   Serratia marcescens NOT DETECTED NOT DETECTED Final   Haemophilus influenzae NOT DETECTED NOT DETECTED Final   Neisseria meningitidis NOT DETECTED NOT DETECTED Final   Pseudomonas aeruginosa DETECTED (A) NOT DETECTED Final    Comment: CRITICAL RESULT CALLED TO, READ BACK BY AND VERIFIED WITH: JASON ROBBINS PHARMD @0307  11/17/23 ASW    Stenotrophomonas maltophilia NOT DETECTED NOT DETECTED Final   Candida albicans NOT DETECTED NOT DETECTED Final   Candida auris NOT DETECTED NOT DETECTED Final   Candida glabrata NOT DETECTED NOT DETECTED Final   Candida krusei NOT DETECTED NOT DETECTED Final   Candida parapsilosis NOT DETECTED NOT DETECTED Final   Candida tropicalis NOT DETECTED NOT DETECTED Final    Cryptococcus neoformans/gattii NOT DETECTED NOT DETECTED Final   CTX-M ESBL NOT DETECTED NOT DETECTED Final   Carbapenem resistance IMP NOT DETECTED NOT DETECTED Final   Carbapenem resistance KPC NOT DETECTED NOT DETECTED Final   Carbapenem resistance NDM NOT DETECTED NOT DETECTED Final   Carbapenem resistance VIM NOT DETECTED NOT DETECTED Final    Comment: Performed at [facility], [address] [place], [place] [address]  Blood Culture ID Panel (Reflexed)     Status: Abnormal   Collection Time: 11/16/23  8:05 AM  Result Value Ref Range Status   Enterococcus faecalis NOT DETECTED NOT DETECTED Final   Enterococcus Faecium NOT DETECTED NOT DETECTED Final   Listeria monocytogenes NOT DETECTED NOT DETECTED Final   Staphylococcus species DETECTED (A) NOT DETECTED Final    Comment: CRITICAL RESULT CALLED TO, READ BACK BY AND VERIFIED WITH: WILL ANDERSON 1733 11/17/23 MU    Staphylococcus aureus (BCID) NOT DETECTED NOT DETECTED Final   Staphylococcus epidermidis DETECTED (A) NOT DETECTED Final    Comment: Methicillin (oxacillin) resistant coagulase negative staphylococcus. Possible blood culture contaminant (unless isolated from more than one blood culture draw or clinical case suggests pathogenicity). No antibiotic treatment is indicated for blood  culture contaminants. CRITICAL RESULT CALLED TO, READ BACK BY AND VERIFIED WITH: WILL ANDERSON 1733 11/17/23 MU    Staphylococcus lugdunensis NOT DETECTED NOT DETECTED Final   Streptococcus species NOT DETECTED NOT DETECTED Final   Streptococcus agalactiae NOT DETECTED NOT DETECTED Final   Streptococcus pneumoniae NOT DETECTED NOT DETECTED Final   Streptococcus pyogenes NOT DETECTED NOT DETECTED Final   A.calcoaceticus-baumannii NOT DETECTED NOT DETECTED Final   Bacteroides fragilis NOT DETECTED NOT DETECTED Final   Enterobacterales NOT DETECTED NOT DETECTED Final   Enterobacter cloacae complex NOT DETECTED NOT DETECTED Final    Escherichia coli NOT DETECTED NOT DETECTED Final   Klebsiella aerogenes NOT DETECTED NOT DETECTED Final   Klebsiella oxytoca NOT DETECTED NOT DETECTED Final   Klebsiella pneumoniae NOT DETECTED NOT DETECTED Final   Proteus species NOT DETECTED NOT DETECTED Final   Salmonella species NOT DETECTED NOT DETECTED Final   Serratia marcescens NOT DETECTED NOT DETECTED Final   Haemophilus influenzae NOT DETECTED NOT DETECTED Final   Neisseria meningitidis NOT DETECTED NOT DETECTED Final  Pseudomonas aeruginosa NOT DETECTED NOT DETECTED Final   Stenotrophomonas maltophilia NOT DETECTED NOT DETECTED Final   Candida albicans NOT DETECTED NOT DETECTED Final   Candida auris NOT DETECTED NOT DETECTED Final   Candida glabrata NOT DETECTED NOT DETECTED Final   Candida krusei NOT DETECTED NOT DETECTED Final   Candida parapsilosis NOT DETECTED NOT DETECTED Final   Candida tropicalis NOT DETECTED NOT DETECTED Final   Cryptococcus neoformans/gattii NOT DETECTED NOT DETECTED Final   Methicillin resistance mecA/C DETECTED (A) NOT DETECTED Final    Comment: CRITICAL RESULT CALLED TO, READ BACK BY AND VERIFIED WITH: WILL ANDERSON 1733 11/17/23 MU Performed at [facility], [address] [place], [place] [address]     Coagulation Studies: No results for input(s): "LABPROT", "INR" in the last 72 hours.  Urinalysis: Recent Labs    11/16/23 0805  COLORURINE YELLOW*  LABSPEC 1.023  PHURINE 5.0  GLUCOSEU NEGATIVE  HGBUR MODERATE*  BILIRUBINUR NEGATIVE  KETONESUR 5*  PROTEINUR >=300*  NITRITE NEGATIVE  LEUKOCYTESUR MODERATE*      Imaging: No results found.   Medications:    promethazine (PHENERGAN) injection (IM or IVPB)      midodrine  10 mg Oral TID WC   acetaminophen **OR** acetaminophen, antiseptic oral rinse, fentaNYL (SUBLIMAZE) injection, glycopyrrolate **OR** glycopyrrolate **OR** glycopyrrolate, haloperidol **OR** haloperidol **OR** haloperidol lactate, ondansetron  **OR** ondansetron (ZOFRAN) IV, mouth rinse, oxyCODONE, polyvinyl alcohol, promethazine (PHENERGAN) injection (IM or IVPB)  Assessment/ Plan:  Tiffany [name] is a 89 y.o.  female with past medical history including chronic heart failure, A-fib with recent cardioversion on Xarelto, CAD, and chronic kidney disease stage IV-5, who was admitted to [facility] on 11/16/2023 for Sepsis (HCC) [A41.9]   End stage renal disease requiring hemodialysis.  Patient recently started on outpatient hemodialysis treatments.  She has had some issues with hypotension during treatments but otherwise has been tolerating relatively well.  Family decided upon admission that they would like to hold off on further dialysis treatments.  Patient now successfully moved towards comfort care.  Would not remove PermCath as this could lead to additional discomfort.  Lab Results  Component Value Date   CREATININE 3.31 (H) 11/17/2023   CREATININE 3.16 (H) 11/16/2023   CREATININE 3.02 (H) 10/27/2023    Intake/Output Summary (Last 24 hours) at 11/18/2023 1608 Last data filed at 11/17/2023 1751 Gross per 24 hour  Intake --  Output 125 ml  Net -125 ml   2.  Acute on chronic combined heart failure.  Last echo 10/24 with EF of 40 to 45% and grade 1 diastolic dysfunction.  Breathing comfortably at the moment.   3. Diabetes mellitus type II with chronic kidney disease/renal manifestations: Glycemic control per hospitalist.  4.  Left kidney renal mass, seen on MRI abdomen with/without contrast measuring 1.7 to 1.4 cm, consistent with small renal cell carcinoma.  No additional workup necessary.  5. Anemia of chronic kidney disease  Lab Results  Component Value Date   HGB 8.5 (L) 11/17/2023    No need to check CBC or administer erythropoietin stimulating agents going forward.   LOS: 2 Nataliyah Packham 11/21/20244:08 PM

## 2023-11-18 NOTE — [place] FL2 (Signed)
[place] MEDICAID FL2 LEVEL OF CARE FORM     IDENTIFICATION  Patient Name: Tiffany [name] Birthdate: August 19, 1933 Sex: female Admission Date (Current Location): 11/16/2023  [facility] and [place] Number:  Chiropodist and Address:  [facility], [address] [place], [place] [address]      Provider Number: [id number]  Attending Physician Name and Address:  Gillis Santa, MD  Relative Name and Phone Number:  Dr. Theola Sequin [phone]    Current Level of Care: Hospital Recommended Level of Care: Skilled Nursing Facility (SNF with Hospice to Follow) Prior Approval Number:    Date Approved/Denied:   PASRR Number: [id number]  Discharge Plan: SNF (SNF with Hospice following)    Current Diagnoses: Patient Active Problem List   Diagnosis Date Noted   Sepsis (HCC) 11/16/2023   HFrEF (heart failure with reduced ejection fraction) (HCC) 11/16/2023   ESRD (end stage renal disease) (HCC) 11/16/2023   Encephalopathy 11/16/2023   Hypertension 10/29/2023   Spinal stenosis 10/29/2023   Cellulitis 10/19/2023   AKI (acute kidney injury) (HCC) 10/19/2023   CHF (congestive heart failure) (HCC) 10/19/2023   Secondary hyperparathyroidism (HCC) 02/23/2023   Ventral hernia with obstruction and without gangrene 08/27/2022   Acquired thrombophilia (HCC) 01/14/2022   Acute on chronic congestive heart failure (HCC)    Atrial fibrillation, chronic (HCC)    Ventral hernia without obstruction or gangrene    Hydronephrosis with ureteral stricture, not elsewhere classified    Acute on chronic systolic CHF (congestive heart failure) (HCC) 11/05/2021   Hypertensive heart and chronic kidney disease with heart failure and stage 1 through stage 4 chronic kidney disease, or unspecified chronic kidney disease (HCC) 05/29/2019   Anemia 05/26/2019   Hyperlipidemia 05/26/2019   Presence of left artificial knee joint 05/26/2019   Insulin dependent type 2 diabetes  mellitus (HCC) 05/26/2019   UTI (urinary tract infection) 05/26/2019   Status post total knee replacement using cement, left 05/23/2019   Chronic pain of left knee 02/08/2019   Primary osteoarthritis of left knee 02/08/2019   Derangement of lateral meniscus, left 02/08/2019   Chronic pain syndrome 02/08/2019   Morbid obesity (HCC) 05/17/2018   Lumbar radiculitis 04/24/2015    Orientation RESPIRATION BLADDER Height & Weight     Self, Place  Normal Incontinent Weight: 89.8 kg Height:  5\' 2"  (157.5 cm)  BEHAVIORAL SYMPTOMS/MOOD NEUROLOGICAL BOWEL NUTRITION STATUS      Incontinent  (See Discharge Summary)  AMBULATORY STATUS COMMUNICATION OF NEEDS Skin   Extensive Assist Verbally Normal                       Personal Care Assistance Level of Assistance  Bathing, Feeding, Dressing Bathing Assistance: Maximum assistance Feeding assistance: Maximum assistance Dressing Assistance: Maximum assistance     Functional Limitations Info  Sight, Hearing, Speech Sight Info: Adequate Hearing Info: Adequate Speech Info: Adequate    SPECIAL CARE FACTORS FREQUENCY                       Contractures Contractures Info: Not present    Additional Factors Info  Code Status, Allergies Code Status Info: DNR-Comfort Allergies Info: Amlodipine, Codeine, Sacubitril-valsartan, Tizanidine, Doxycycline           Current Medications (11/18/2023):  This is the current hospital active medication list Current Facility-Administered Medications  Medication Dose Route Frequency Provider Last Rate Last Admin   acetaminophen (TYLENOL) tablet 650 mg  650 mg Oral  Q6H PRN Morton Stall, NP       Or   acetaminophen (TYLENOL) suppository 650 mg  650 mg Rectal Q6H PRN Morton Stall, NP   650 mg at 11/17/23 1802   allopurinol (ZYLOPRIM) tablet 50 mg  50 mg Oral Daily Floydene Flock, MD   50 mg at 11/17/23 1037   amiodarone (PACERONE) tablet 200 mg  200 mg Oral Daily Floydene Flock, MD    200 mg at 11/17/23 1037   antiseptic oral rinse (BIOTENE) solution 15 mL  15 mL Topical PRN Morton Stall, NP       apixaban Everlene Balls) tablet 2.5 mg  2.5 mg Oral BID Floydene Flock, MD   2.5 mg at 11/17/23 1037   fentaNYL (SUBLIMAZE) injection 12.5-25 mcg  12.5-25 mcg Intravenous Q5 min PRN Morton Stall, NP   12.5 mcg at 11/17/23 1452   glycopyrrolate (ROBINUL) tablet 1 mg  1 mg Oral Q4H PRN Morton Stall, NP       Or   glycopyrrolate (ROBINUL) injection 0.2 mg  0.2 mg Subcutaneous Q4H PRN Morton Stall, NP       Or   glycopyrrolate (ROBINUL) injection 0.2 mg  0.2 mg Intravenous Q4H PRN Morton Stall, NP       haloperidol (HALDOL) tablet 0.5 mg  0.5 mg Oral Q4H PRN Morton Stall, NP       Or   haloperidol (HALDOL) 2 MG/ML solution 0.5 mg  0.5 mg Sublingual Q4H PRN Morton Stall, NP       Or   haloperidol lactate (HALDOL) injection 0.5 mg  0.5 mg Intravenous Q4H PRN Morton Stall, NP       midodrine (PROAMATINE) tablet 10 mg  10 mg Oral TID WC Floydene Flock, MD   10 mg at 11/17/23 1302   ondansetron (ZOFRAN) tablet 4 mg  4 mg Oral Q6H PRN Floydene Flock, MD       Or   ondansetron [facility]) injection 4 mg  4 mg Intravenous Q6H PRN Floydene Flock, MD   4 mg at 11/17/23 1345   Oral care mouth rinse  15 mL Mouth Rinse PRN Floydene Flock, MD       oxyCODONE (ROXICODONE INTENSOL) 20 MG/ML concentrated solution 5-10 mg  5-10 mg Oral Q2H PRN Morton Stall, NP       polyvinyl alcohol (LIQUIFILM TEARS) 1.4 % ophthalmic solution 1 drop  1 drop Both Eyes QID PRN Morton Stall, NP       promethazine (PHENERGAN) 12.5 mg in sodium chloride 0.9 % 50 mL IVPB  12.5 mg Intravenous Q6H PRN Morton Stall, NP         Discharge Medications: Please see discharge summary for a list of discharge medications.  Relevant Imaging Results:  Relevant Lab Results:   Additional Information [id number]  Comfort Care/ Hospice to follow at facility  Garret Reddish,  RN

## 2023-11-18 NOTE — Plan of Care (Signed)
Id brief note  Reviewed chart Patient and family have decided to transition to hospice and abx was stopped   -continue management as per hospice protocol -id will sign off

## 2023-11-18 NOTE — Consult Note (Signed)
[facility] Liaison Consult Note    11/18/2023  Tiffany [name] 06/23/1933 [id number]  *Coverage for [name], RN [facility] Liaison  Insurance: [employer]  Primary Care Provider: Lynnea Ferrier, MD with [facility] [VBCI] remote coverage review for patient admitted to [facility]    Chart reviewed for less than 30 days readmission, with high risk score for unplanned readmission.  Patient's electronic medical record as reviewed from Palliative consult notes and reveals the patient is currently transitioning to Hospice Care with [facility] noted.   Plan: Patient will have full care coordination services through Hospice and needs will be met at the hospice level of care. No planned follow up for transitional needs. Continue to follow and Will sign off at transition from hospital.  For questions,    Charlesetta Shanks, RN, BSN, CCM [place]  [facility], [employer] [facility] Liaison Direct Dial: [phone] or secure chat Email: [email or web address]

## 2023-11-18 NOTE — TOC Transition Note (Signed)
Transition of Care [facility]) - CM/SW Discharge Note   Patient Details  Name: Tiffany [name] MRN: [id number] Date of Birth: [birth year]  [facility] CM/SW Contact:  Garret Reddish, RN Phone Number: 11/18/2023, 3:46 PM   Clinical Narrative:    Chart reviewed.  Noted that patient has orders for discharge today.    I have spoken with Dr. Montez Morita and he has requested that [facility] follow patient at the facility.  I have asked Annice Pih with [facility] to consult on patient.    I have spoken with Sue Lush, Admission Coordinator with [facility]. She informs me that patient will she will have a bed for patient.  She informs me that no insurance authorization will be needs as patient will be followed by Hospice at the facility.  Sue Lush reports that patient will go to room 517 and the number to call report is [phone].    I have sent [facility] patient's Discharge Summary, Discharge orders, and SNF transfer packet.    I have informed Dr. Montez Morita that [facility] will transport patient to [facility] today.    I have asked [facility] to transport patient to the [facility] today.    I have informed staff nurse of the above information.    Final next level of care: Skilled Nursing Facility Barriers to Discharge: No Barriers Identified   Patient Goals and CMS Choice CMS Medicare.gov Compare Post Acute Care list provided to:: Patient Represenative (must comment) (Patient's husband) Choice offered to / list presented to : Patient  Discharge Placement                  Patient to be transferred to facility by: [facility] Name of family member notified: Dr. Montez Morita Patient and family notified of of transfer: 11/18/23  Discharge Plan and Services Additional resources added to the After Visit Summary for                                       Social Determinants of Health (SDOH) Interventions SDOH Screenings   Food  Insecurity: No Food Insecurity (11/16/2023)  Housing: Low Risk  (11/16/2023)  Transportation Needs: No Transportation Needs (11/16/2023)  Utilities: Not At Risk (11/16/2023)  Depression (PHQ2-9): Low Risk  (02/20/2019)  Financial Resource Strain: Low Risk  (09/29/2023)   Received from [facility]  Tobacco Use: Low Risk  (11/16/2023)     Readmission Risk Interventions     No data to display

## 2023-11-18 NOTE — Progress Notes (Signed)
Daily Progress Note   Patient Name: Tiffany [name]       Date: 11/18/2023 DOB: 09-Sep-1933  Age: 89 y.o. MRN#: [id number] Attending Physician: Gillis Santa, MD Primary Care Physician: Lynnea Ferrier, MD Admit Date: 11/16/2023  Reason for Consultation/Follow-up: Establishing goals of care  Subjective: Notes and labs reviewed. Patient is resting in bed with eyes closed. No distress noted at this time. Husband is actively working with [employer]. PMT will sign off as goals are set.   Length of Stay: 2  Current Medications: Scheduled Meds:   midodrine  10 mg Oral TID WC    Continuous Infusions:  promethazine (PHENERGAN) injection (IM or IVPB)      PRN Meds: acetaminophen **OR** acetaminophen, antiseptic oral rinse, fentaNYL (SUBLIMAZE) injection, glycopyrrolate **OR** glycopyrrolate **OR** glycopyrrolate, haloperidol **OR** haloperidol **OR** haloperidol lactate, ondansetron **OR** ondansetron (ZOFRAN) IV, mouth rinse, oxyCODONE, polyvinyl alcohol, promethazine (PHENERGAN) injection (IM or IVPB)  Physical Exam Constitutional:      Comments: Eyes closed.  Pulmonary:     Effort: Pulmonary effort is normal.             Vital Signs: BP (!) 109/50 (BP Location: Left Arm)   Pulse 70   Temp 97.6 F (36.4 C) (Oral)   Resp 18   Ht 5\' 2"  (1.575 m)   Wt 89.8 kg   SpO2 97%   BMI 36.21 kg/m  SpO2: SpO2: 97 % O2 Device: O2 Device: Room Air O2 Flow Rate: O2 Flow Rate (L/min): 2 L/min  Intake/output summary:  Intake/Output Summary (Last 24 hours) at 11/18/2023 1144 Last data filed at 11/17/2023 1751 Gross per 24 hour  Intake 145.95 ml  Output 125 ml  Net 20.95 ml   LBM: Last BM Date : 11/17/23 Baseline Weight: Weight: 89.8 kg Most recent weight: Weight: 89.8  kg   Patient Active Problem List   Diagnosis Date Noted   Sepsis (HCC) 11/16/2023   HFrEF (heart failure with reduced ejection fraction) (HCC) 11/16/2023   ESRD (end stage renal disease) (HCC) 11/16/2023   Encephalopathy 11/16/2023   Hypertension 10/29/2023   Spinal stenosis 10/29/2023   Cellulitis 10/19/2023   AKI (acute kidney injury) (HCC) 10/19/2023   CHF (congestive heart failure) (HCC) 10/19/2023   Secondary hyperparathyroidism (HCC) 02/23/2023   Ventral hernia with obstruction and without gangrene  08/27/2022   Acquired thrombophilia (HCC) 01/14/2022   Acute on chronic congestive heart failure (HCC)    Atrial fibrillation, chronic (HCC)    Ventral hernia without obstruction or gangrene    Hydronephrosis with ureteral stricture, not elsewhere classified    Acute on chronic systolic CHF (congestive heart failure) (HCC) 11/05/2021   Hypertensive heart and chronic kidney disease with heart failure and stage 1 through stage 4 chronic kidney disease, or unspecified chronic kidney disease (HCC) 05/29/2019   Anemia 05/26/2019   Hyperlipidemia 05/26/2019   Presence of left artificial knee joint 05/26/2019   Insulin dependent type 2 diabetes mellitus (HCC) 05/26/2019   UTI (urinary tract infection) 05/26/2019   Status post total knee replacement using cement, left 05/23/2019   Chronic pain of left knee 02/08/2019   Primary osteoarthritis of left knee 02/08/2019   Derangement of lateral meniscus, left 02/08/2019   Chronic pain syndrome 02/08/2019   Morbid obesity (HCC) 05/17/2018   Lumbar radiculitis 04/24/2015    Palliative Care Assessment & Plan     Recommendations/Plan: Patient on comfort care. Husband working with [employer].  PMT will sign off as goals are set.   Code Status:    Code Status Orders  (From admission, onward)           Start     Ordered   11/17/23 1410  Do not attempt resuscitation (DNR) - Comfort care  Continuous       Question Answer  Comment  If patient has no pulse and is not breathing Do Not Attempt Resuscitation   In Pre-Arrest Conditions (Patient Is Breathing and Has a Pulse) Provide comfort measures. Relieve any mechanical airway obstruction. Avoid transfer unless required for comfort.   Consent: Discussion documented in EHR or advanced directives reviewed      11/17/23 1414           Code Status History     Date Active Date Inactive Code Status Order ID Comments User Context   11/17/2023 1407 11/17/2023 1414 Do not attempt resuscitation (DNR) - Comfort care [id number]  Morton Stall, NP Inpatient   11/16/2023 1608 11/17/2023 1407 Limited: Do not attempt resuscitation (DNR) -DNR-LIMITED -Do Not Intubate/DNI  [id number]  Floydene Flock, MD Inpatient   10/29/2023 2252 11/16/2023 0750 DNR [id number]  Earnestine Mealing, MD Outpatient   10/19/2023 0945 10/28/2023 1934 Full Code [id number]  Emeline General, MD ED   08/27/2022 1543 08/28/2022 1808 Full Code [id number]  Sung Amabile, DO Inpatient   11/05/2021 1533 11/07/2021 1914 Full Code [id number]  Darlin Drop, DO ED   05/23/2019 1514 05/26/2019 1645 Full Code [id number]  Poggi, Excell Seltzer, MD Inpatient      Care plan was discussed with [facility] liaison.   Thank you for allowing the Palliative Medicine Team to assist in the care of this patient.    Morton Stall, NP  Please contact Palliative Medicine Team phone at [phone] for questions and concerns.

## 2023-11-19 ENCOUNTER — Encounter: Payer: Self-pay | Admitting: Student

## 2023-11-19 ENCOUNTER — Non-Acute Institutional Stay (SKILLED_NURSING_FACILITY): Payer: PPO | Admitting: Student

## 2023-11-19 DIAGNOSIS — I509 Heart failure, unspecified: Secondary | ICD-10-CM | POA: Diagnosis not present

## 2023-11-19 DIAGNOSIS — Z66 Do not resuscitate: Secondary | ICD-10-CM | POA: Diagnosis not present

## 2023-11-19 DIAGNOSIS — N184 Chronic kidney disease, stage 4 (severe): Secondary | ICD-10-CM

## 2023-11-19 DIAGNOSIS — Z515 Encounter for palliative care: Secondary | ICD-10-CM | POA: Diagnosis not present

## 2023-11-19 DIAGNOSIS — A419 Sepsis, unspecified organism: Secondary | ICD-10-CM

## 2023-11-19 DIAGNOSIS — E1122 Type 2 diabetes mellitus with diabetic chronic kidney disease: Secondary | ICD-10-CM

## 2023-11-19 DIAGNOSIS — Z794 Long term (current) use of insulin: Secondary | ICD-10-CM

## 2023-11-19 DIAGNOSIS — N186 End stage renal disease: Secondary | ICD-10-CM | POA: Diagnosis not present

## 2023-11-19 DIAGNOSIS — I482 Chronic atrial fibrillation, unspecified: Secondary | ICD-10-CM

## 2023-11-19 DIAGNOSIS — G9341 Metabolic encephalopathy: Secondary | ICD-10-CM

## 2023-11-19 DIAGNOSIS — Z992 Dependence on renal dialysis: Secondary | ICD-10-CM

## 2023-11-19 LAB — CULTURE, BLOOD (ROUTINE X 2): Special Requests: ADEQUATE

## 2023-11-19 MED ORDER — MORPHINE SULFATE (CONCENTRATE) 20 MG/ML PO SOLN: 5.0000 mg | ORAL | 0 refills | Status: DC | PRN

## 2023-11-19 NOTE — Progress Notes (Unsigned)
Provider:  Coralyn Helling, M.D. Location:  [name] Room Number: 517 A Place of Service:  SNF ([phone])  PCP: Lynnea Ferrier, MD Patient Care Team: Lynnea Ferrier, MD as PCP - General (Internal Medicine) Rodney Langton, RN as [facility] Care Management  Extended Emergency Contact Information Primary Emergency Contact: Newton,DR. [name] Address: [address]          [place], [place] [address] [place] Home Phone: [phone] Mobile Phone: [phone] Relation: Spouse  Code Status: DNR Goals of Care: Advanced Directive information    11/19/2023    8:41 AM  Advanced Directives  Does Patient Have a Medical Advance Directive? Yes  Type of Advance Directive Out of facility DNR (pink MOST or yellow form)  Does patient want to make changes to medical advance directive? No - Patient declined  Pre-existing out of facility DNR order (yellow form or pink MOST form) Pink MOST form placed in chart (order not valid for inpatient use)      Chief Complaint  Patient presents with   New Admit To SNF    New admission to [facility]     HPI: Patient is a 89 y.o. female seen today for admission to  Past Medical History:  Diagnosis Date   Anemia    Aortic atherosclerosis (HCC)    Arthritis    CAD (coronary artery disease) 02/27/2015   a.) MPI 02/27/2015: EF 62%, small/mild area of septal hypoperfusion with borderline partial redistribution; equivocal study; b.) LHC 03/13/2015: LVEDP 16 mmHg; 10% mLAD, 10% RCA --> no intervention required (med mgmt); c.) MPI 03/16/2019: EF 56%; normal study.   Cataracts, both eyes    Cervical disc disease    a.) s/p cervical fusion   Chickenpox    CKD (chronic kidney disease), stage IV (HCC)    Complication of anesthesia    a.) remote h/o PONV; b.) difficult intubation related to remote cervical fusion   Difficult intubation    a.) secondary to remote cervical fusion; reports severe pain s/p  being intubated   Dyspnea on exertion    HFrEF (heart failure with reduced ejection fraction) (HCC)    a.) TTE 01/11/2014: EF >55%, mod LVH, MAC, mild RAE, mild MR/TR, sev PR; b.) TTE 11/14/2018: EF 40%, sep and inf HK, mild LAE, triv P, mod MR/TR, G2DD; c.) TTE 11/15/2020: EF 45%, sep HK, mild LVH, triv AR/PR, mild MR/TR, d.) TTE 10/28/2021: EF 45%, mild glob HK, mild LVH, triv AR/PR, mild MR, mod TR; e.) TTE 11/06/2021: EF 30-35%, mid apical and anteroseptal HK, mild TR, mod MR, G2DD.   History of recurrent UTI (urinary tract infection)    Hyperlipidemia    Hypertension    Incontinence in female    wears pads   LBBB (left bundle branch block)    Long term (current) use of anticoagulants    a.) rivaroxaban   Long-term current use of opiate analgesic    a.) hydrocodone/APAP (Norco)   Lumbar disc disease    a.) s/p lumbar interbody fusion L2-L5   PAF (paroxysmal atrial fibrillation) (HCC)    a.) CHA2DS2VASc = 7 (age x2, sex, HFrEF, HTN, vascular diasease, T2DM); b.) s/p DCCV 01/12/2017 (150 J x 1) and 11/04/2021 (120 J x 1); c.) rate/rhythm maintained on oral amiodarone + metoprolol succinate; chronically anticoagulated with rivaroxaban   PONV (postoperative nausea and vomiting)    with 1st pregnancy 50 years ago and no problem since then  Right leg weakness    Sciatica    Spinal stenosis of lumbar region    Type 2 diabetes mellitus treated with insulin (HCC)    Ventral hernia    Past Surgical History:  Procedure Laterality Date   CARDIOVERSION N/A 11/04/2021   Procedure: CARDIOVERSION;  Surgeon: Dalia Heading, MD;  Location: [facility];  Service: Cardiovascular;  Laterality: N/A;   CARDIOVERSION N/A 03/17/2023   Procedure: CARDIOVERSION;  Surgeon: Marcina Millard, MD;  Location: [facility];  Service: Cardiovascular;  Laterality: N/A;   CATARACT EXTRACTION, BILATERAL     CERVICAL LAMINECTOMY  1996   CYSTOSCOPY W/ RETROGRADES  12/06/2020   Procedure: CYSTOSCOPY WITH RETROGRADE  PYELOGRAM;  Surgeon: Sondra Come, MD;  Location: [facility];  Service: Urology;;   CYSTOSCOPY W/ URETERAL STENT PLACEMENT Left 04/10/2022   Procedure: CYSTOSCOPY WITH STENT REPLACEMENT;  Surgeon: Sondra Come, MD;  Location: [facility];  Service: Urology;  Laterality: Left;   CYSTOSCOPY W/ URETERAL STENT PLACEMENT Left 03/12/2023   Procedure: CYSTOSCOPY WITH STENT EXCHANGE;  Surgeon: Sondra Come, MD;  Location: [facility];  Service: Urology;  Laterality: Left;   CYSTOSCOPY WITH STENT PLACEMENT Left 12/06/2020   Procedure: CYSTOSCOPY WITH STENT PLACEMENT;  Surgeon: Sondra Come, MD;  Location: [facility];  Service: Urology;  Laterality: Left;   DIALYSIS/PERMA CATHETER INSERTION Right 10/22/2023   Procedure: DIALYSIS/PERMA CATHETER INSERTION;  Surgeon: Renford Dills, MD;  Location: [facility];  Service: Cardiovascular;  Laterality: Right;   ELECTROPHYSIOLOGIC STUDY N/A 01/12/2017   Procedure: Cardioversion;  Surgeon: Dalia Heading, MD;  Location: [facility];  Service: Cardiovascular;  Laterality: N/A;   INSERTION OF MESH N/A 08/27/2022   Procedure: INSERTION OF MESH;  Surgeon: Sung Amabile, DO;  Location: [facility];  Service: General;  Laterality: N/A;   JOINT REPLACEMENT     LATERAL FUSION LUMBAR SPINE, TRANSVERSE  01/2010   L2-L5   LEFT HEART CATH AND CORONARY ANGIOGRAPHY Left 03/13/2015   Procedure: LEFT HEART CATH AND CORONARY ANGIOGRAPHY; Location: [facility]; Surgeon: Harold Hedge, MD   ROTATOR CUFF REPAIR Right 2010   TONSILLECTOMY     TOTAL KNEE ARTHROPLASTY Left 05/23/2019   Procedure: TOTAL KNEE ARTHROPLASTY - LEFT - DIABETIC;  Surgeon: Christena Flake, MD;  Location: [facility];  Service: Orthopedics;  Laterality: Left;   URETEROSCOPY N/A 12/06/2020   Procedure: DIAGNOSTIC URETEROSCOPY;  Surgeon: Sondra Come, MD;  Location: [facility];  Service: Urology;  Laterality: N/A;   XI ROBOTIC ASSISTED VENTRAL HERNIA N/A 08/27/2022   Procedure: XI ROBOTIC ASSISTED VENTRAL  HERNIA;  Surgeon: Sung Amabile, DO;  Location: [facility];  Service: General;  Laterality: N/A;    reports that she has never smoked. She has never been exposed to tobacco smoke. She has never used smokeless tobacco. She reports current alcohol use of about 1.0 standard drink of alcohol per week. She reports that she does not use drugs. Social History   Socioeconomic History   Marital status: Married    Spouse name: Molly Maduro   Number of children: Not on file   Years of education: Not on file   Highest education level: Not on file  Occupational History   Not on file  Tobacco Use   Smoking status: Never    Passive exposure: Never   Smokeless tobacco: Never  Vaping Use   Vaping status: Never Used  Substance and Sexual Activity   Alcohol use: Yes    Alcohol/week: 1.0 standard drink of alcohol  Types: 1 Glasses of wine per week    Comment: RARE   Drug use: Never   Sexual activity: Not Currently    Birth control/protection: Post-menopausal  Other Topics Concern   Not on file  Social History Narrative   Not on file   Social Determinants of Health   Financial Resource Strain: Low Risk  (09/29/2023)   Received from [facility]   Overall Financial Resource Strain (CARDIA)    Difficulty of Paying Living Expenses: Not hard at all  Food Insecurity: No Food Insecurity (11/16/2023)   Hunger Vital Sign    Worried About Running Out of Food in the Last Year: Never true    Ran Out of Food in the Last Year: Never true  Transportation Needs: No Transportation Needs (11/16/2023)   PRAPARE - Administrator, Civil Service (Medical): No    Lack of Transportation (Non-Medical): No  Physical Activity: Not on file  Stress: Not on file  Social Connections: Not on file  Intimate Partner Violence: Not At Risk (11/16/2023)   Humiliation, Afraid, Rape, and Kick questionnaire    Fear of Current or Ex-Partner: No    Emotionally Abused: No    Physically Abused: No     Sexually Abused: No    Functional Status Survey:    Family History  Adopted: Yes    Health Maintenance  Topic Date Due   FOOT EXAM  Never done   OPHTHALMOLOGY EXAM  Never done   DEXA SCAN  Never done   Zoster Vaccines- Shingrix (2 of 2) 12/03/2017   INFLUENZA VACCINE  07/29/2023   COVID-19 Vaccine (5 - 2023-24 season) 08/29/2023   HEMOGLOBIN A1C  04/18/2024   Medicare Annual Wellness (AWV)  09/28/2024   DTaP/Tdap/Td (2 - Tdap) 06/03/2026   Pneumonia Vaccine 27+ Years old  Completed   HPV VACCINES  Aged Out    Allergies  Allergen Reactions   Amlodipine Swelling   Codeine Nausea And Vomiting    Tolerates low doses hydrocodone   Sacubitril-Valsartan Other (See Comments)    Acute kidney injury   Tizanidine     Didn't feel good taking it   Doxycycline Nausea And Vomiting    Outpatient Encounter Medications as of 11/19/2023  Medication Sig   acetaminophen (TYLENOL) 500 MG tablet Take 1 tablet (500 mg total) by mouth every 8 (eight) hours as needed for mild pain (pain score 1-3).   No facility-administered encounter medications on file as of 11/19/2023.    Review of Systems  Vitals:   11/19/23 0835  BP: 130/68  Pulse: 74  Temp: 98.3 F (36.8 C)  SpO2: 97%  Weight: 199 lb (90.3 kg)  Height: 5\' 2"  (1.575 m)   Body mass index is 36.4 kg/m. Physical Exam  Labs reviewed: Basic Metabolic Panel: Recent Labs    10/27/23 0615 11/16/23 0805 11/17/23 0302 11/17/23 0757  NA 131* 130* 130*  --   K 3.8 3.4* 3.1*  --   CL 95* 89* 96*  --   CO2 23 26 25   --   GLUCOSE 207* 229* 124*  --   BUN 68* 29* 35*  --   CREATININE 3.02* 3.16* 3.31*  --   CALCIUM 8.3* 8.3* 8.0*  --   MG  --   --   --  1.8  PHOS  --   --   --  4.2   Liver Function Tests: Recent Labs    11/16/23 0805 11/17/23 0302  AST 38  103*  ALT 28 60*  ALKPHOS 81 56  BILITOT 1.0 1.1  PROT 5.9* 4.6*  ALBUMIN 2.8* 2.0*   No results for input(s): "LIPASE", "AMYLASE" in the last 8760 hours. No  results for input(s): "AMMONIA" in the last 8760 hours. CBC: Recent Labs    10/26/23 0521 10/27/23 0615 11/16/23 0805 11/17/23 0302  WBC 9.6 9.2 14.8* 18.3*  NEUTROABS 7.6 7.1 11.6*  --   HGB 9.9* 10.6* 10.7* 8.5*  HCT 31.0* 34.1* 34.4* 26.2*  MCV 84.0 86.8 88.0 85.6  PLT 247 249 186 138*   Cardiac Enzymes: Recent Labs    10/19/23 0827  CKTOTAL 16*   BNP: Invalid input(s): "POCBNP" Lab Results  Component Value Date   HGBA1C 8.9 (H) 10/19/2023   Lab Results  Component Value Date   TSH 0.886 04/19/2012   Lab Results  Component Value Date   VITAMINB12 588 11/17/2023   Lab Results  Component Value Date   FOLATE 16.5 11/17/2023   Lab Results  Component Value Date   IRON 12 (L) 11/17/2023   TIBC 235 (L) 11/17/2023    Imaging and Procedures obtained prior to SNF admission: DG Chest Port 1 View  Result Date: 11/16/2023 CLINICAL DATA:  Questionable sepsis - evaluate for abnormality EXAM: PORTABLE CHEST 1 VIEW COMPARISON:  None Available. FINDINGS: Tunneled hemodialysis catheter with tip terminating in the right atrium. Low lung volumes. The cardiomediastinal silhouette is within normal limits. No pleural effusion. No pneumothorax. No mass or consolidation. No acute osseous abnormality. IMPRESSION: No acute findings in the chest. Electronically Signed   By: Olive Bass M.D.   On: 11/16/2023 13:13    Assessment/Plan There are no diagnoses linked to this encounter.   Family/ staff Communication:   Labs/tests ordered:

## 2023-11-20 ENCOUNTER — Encounter: Payer: Self-pay | Admitting: Student

## 2023-11-21 LAB — GLUCOSE, CAPILLARY
Glucose-Capillary: 117 mg/dL — ABNORMAL HIGH (ref 70–99)
Glucose-Capillary: 59 mg/dL — ABNORMAL LOW (ref 70–99)

## 2023-11-22 ENCOUNTER — Non-Acute Institutional Stay (SKILLED_NURSING_FACILITY): Payer: PPO | Admitting: Student

## 2023-11-22 ENCOUNTER — Encounter: Payer: Self-pay | Admitting: Student

## 2023-11-22 DIAGNOSIS — Z515 Encounter for palliative care: Secondary | ICD-10-CM

## 2023-11-22 DIAGNOSIS — I509 Heart failure, unspecified: Secondary | ICD-10-CM | POA: Diagnosis not present

## 2023-11-22 DIAGNOSIS — Z66 Do not resuscitate: Secondary | ICD-10-CM | POA: Diagnosis not present

## 2023-11-22 DIAGNOSIS — N186 End stage renal disease: Secondary | ICD-10-CM

## 2023-11-22 DIAGNOSIS — Z992 Dependence on renal dialysis: Secondary | ICD-10-CM | POA: Diagnosis not present

## 2023-11-22 MED ORDER — LORAZEPAM 1 MG PO TABS: 1.0000 mg | ORAL_TABLET | ORAL | 0 refills | Status: DC | PRN

## 2023-11-22 NOTE — Progress Notes (Signed)
Location:  Other Nursing Home Room Number: 517 A Place of Service:  SNF (31) Provider:  Forrest Moron, Viona Gilmore, MD  Patient Care Team: Lynnea Ferrier, MD as PCP - General (Internal Medicine) Rodney Langton, RN as [facility] Care Management  Extended Emergency Contact Information Primary Emergency Contact: Nidiffer,DR. [name] Address: [address]          [place], [place] [address] Darden [place] of [place] Home Phone: [phone] Mobile Phone: [phone] Relation: Spouse  Code Status:  DNR Goals of care: Advanced Directive information    11/19/2023    8:41 AM  Advanced Directives  Does Patient Have a Medical Advance Directive? Yes  Type of Advance Directive Out of facility DNR (pink MOST or yellow form)  Does patient want to make changes to medical advance directive? No - Patient declined  Pre-existing out of facility DNR order (yellow form or pink MOST form) Pink MOST form placed in chart (order not valid for inpatient use)     Chief Complaint  Patient presents with   hospice care    HPI:  Pt is a 89 y.o. female seen today for an acute visit for symptom management. Patient is sleeping in bed and nonresponsive. Son and spouse at bedside. Patient has not woken up since arrival. She moans and winces with movements and care.    Past Medical History:  Diagnosis Date   Anemia    Aortic atherosclerosis (HCC)    Arthritis    CAD (coronary artery disease) 02/27/2015   a.) MPI 02/27/2015: EF 62%, small/mild area of septal hypoperfusion with borderline partial redistribution; equivocal study; b.) LHC 03/13/2015: LVEDP 16 mmHg; 10% mLAD, 10% RCA --> no intervention required (med mgmt); c.) MPI 03/16/2019: EF 56%; normal study.   Cataracts, both eyes    Cervical disc disease    a.) s/p cervical fusion   Chickenpox    CKD (chronic kidney disease), stage IV (HCC)    Complication of anesthesia    a.) remote h/o PONV; b.) difficult intubation related to  remote cervical fusion   Difficult intubation    a.) secondary to remote cervical fusion; reports severe pain s/p being intubated   Dyspnea on exertion    HFrEF (heart failure with reduced ejection fraction) (HCC)    a.) TTE 01/11/2014: EF >55%, mod LVH, MAC, mild RAE, mild MR/TR, sev PR; b.) TTE 11/14/2018: EF 40%, sep and inf HK, mild LAE, triv P, mod MR/TR, G2DD; c.) TTE 11/15/2020: EF 45%, sep HK, mild LVH, triv AR/PR, mild MR/TR, d.) TTE 10/28/2021: EF 45%, mild glob HK, mild LVH, triv AR/PR, mild MR, mod TR; e.) TTE 11/06/2021: EF 30-35%, mid apical and anteroseptal HK, mild TR, mod MR, G2DD.   History of recurrent UTI (urinary tract infection)    Hyperlipidemia    Hypertension    Incontinence in female    wears pads   LBBB (left bundle branch block)    Long term (current) use of anticoagulants    a.) rivaroxaban   Long-term current use of opiate analgesic    a.) hydrocodone/APAP (Norco)   Lumbar disc disease    a.) s/p lumbar interbody fusion L2-L5   PAF (paroxysmal atrial fibrillation) (HCC)    a.) CHA2DS2VASc = 7 (age x2, sex, HFrEF, HTN, vascular diasease, T2DM); b.) s/p DCCV 01/12/2017 (150 J x 1) and 11/04/2021 (120 J x 1); c.) rate/rhythm maintained on oral amiodarone + metoprolol succinate; chronically anticoagulated with rivaroxaban   PONV (postoperative  nausea and vomiting)    with 1st pregnancy 50 years ago and no problem since then   Right leg weakness    Sciatica    Spinal stenosis of lumbar region    Type 2 diabetes mellitus treated with insulin (HCC)    Ventral hernia    Past Surgical History:  Procedure Laterality Date   CARDIOVERSION N/A 11/04/2021   Procedure: CARDIOVERSION;  Surgeon: Dalia Heading, MD;  Location: [facility];  Service: Cardiovascular;  Laterality: N/A;   CARDIOVERSION N/A 03/17/2023   Procedure: CARDIOVERSION;  Surgeon: Marcina Millard, MD;  Location: [facility];  Service: Cardiovascular;  Laterality: N/A;   CATARACT EXTRACTION, BILATERAL      CERVICAL LAMINECTOMY  1996   CYSTOSCOPY W/ RETROGRADES  12/06/2020   Procedure: CYSTOSCOPY WITH RETROGRADE PYELOGRAM;  Surgeon: Sondra Come, MD;  Location: [facility];  Service: Urology;;   CYSTOSCOPY W/ URETERAL STENT PLACEMENT Left 04/10/2022   Procedure: CYSTOSCOPY WITH STENT REPLACEMENT;  Surgeon: Sondra Come, MD;  Location: [facility];  Service: Urology;  Laterality: Left;   CYSTOSCOPY W/ URETERAL STENT PLACEMENT Left 03/12/2023   Procedure: CYSTOSCOPY WITH STENT EXCHANGE;  Surgeon: Sondra Come, MD;  Location: [facility];  Service: Urology;  Laterality: Left;   CYSTOSCOPY WITH STENT PLACEMENT Left 12/06/2020   Procedure: CYSTOSCOPY WITH STENT PLACEMENT;  Surgeon: Sondra Come, MD;  Location: [facility];  Service: Urology;  Laterality: Left;   DIALYSIS/PERMA CATHETER INSERTION Right 10/22/2023   Procedure: DIALYSIS/PERMA CATHETER INSERTION;  Surgeon: Renford Dills, MD;  Location: [facility];  Service: Cardiovascular;  Laterality: Right;   ELECTROPHYSIOLOGIC STUDY N/A 01/12/2017   Procedure: Cardioversion;  Surgeon: Dalia Heading, MD;  Location: [facility];  Service: Cardiovascular;  Laterality: N/A;   INSERTION OF MESH N/A 08/27/2022   Procedure: INSERTION OF MESH;  Surgeon: Sung Amabile, DO;  Location: [facility];  Service: General;  Laterality: N/A;   JOINT REPLACEMENT     LATERAL FUSION LUMBAR SPINE, TRANSVERSE  01/2010   L2-L5   LEFT HEART CATH AND CORONARY ANGIOGRAPHY Left 03/13/2015   Procedure: LEFT HEART CATH AND CORONARY ANGIOGRAPHY; Location: [facility]; Surgeon: Harold Hedge, MD   ROTATOR CUFF REPAIR Right 2010   TONSILLECTOMY     TOTAL KNEE ARTHROPLASTY Left 05/23/2019   Procedure: TOTAL KNEE ARTHROPLASTY - LEFT - DIABETIC;  Surgeon: Christena Flake, MD;  Location: [facility];  Service: Orthopedics;  Laterality: Left;   URETEROSCOPY N/A 12/06/2020   Procedure: DIAGNOSTIC URETEROSCOPY;  Surgeon: Sondra Come, MD;  Location: [facility];  Service: Urology;   Laterality: N/A;   XI ROBOTIC ASSISTED VENTRAL HERNIA N/A 08/27/2022   Procedure: XI ROBOTIC ASSISTED VENTRAL HERNIA;  Surgeon: Sung Amabile, DO;  Location: [facility];  Service: General;  Laterality: N/A;    Allergies  Allergen Reactions   Amlodipine Swelling   Codeine Nausea And Vomiting    Tolerates low doses hydrocodone   Sacubitril-Valsartan Other (See Comments)    Acute kidney injury   Tizanidine     Didn't feel good taking it   Doxycycline Nausea And Vomiting    Outpatient Encounter Medications as of 11/22/2023  Medication Sig   LORazepam (ATIVAN) 1 MG tablet Take 1 tablet (1 mg total) by mouth every 4 (four) hours as needed for anxiety.   acetaminophen (TYLENOL) 500 MG tablet Take 1 tablet (500 mg total) by mouth every 8 (eight) hours as needed for mild pain (pain score 1-3).   morphine (ROXANOL) 20 MG/ML concentrated  solution Take 0.25 mLs (5 mg total) by mouth every 2 (two) hours as needed.   No facility-administered encounter medications on file as of 11/22/2023.    Review of Systems  Immunization History  Administered Date(s) Administered   Influenza Inj Mdck Quad Pf 09/29/2016   Influenza, High Dose Seasonal PF 09/19/2019, 10/17/2020   Influenza-Unspecified 09/30/2011, 09/23/2017, 10/10/2018, 09/25/2022   PFIZER(Purple Top)SARS-COV-2 Vaccination 03/19/2020, 04/09/2020, 10/04/2020   Pneumococcal Conjugate-13 01/05/2017, 05/28/2017   Pneumococcal Polysaccharide-23 12/09/2012   Td 06/03/2016   Unspecified SARS-COV-2 Vaccination 09/25/2022   Zoster Recombinant(Shingrix) 10/08/2017   Zoster, Live 01/27/2011   Pertinent  Health Maintenance Due  Topic Date Due   FOOT EXAM  Never done   OPHTHALMOLOGY EXAM  Never done   DEXA SCAN  Never done   INFLUENZA VACCINE  07/29/2023   HEMOGLOBIN A1C  04/18/2024      11/06/2021    7:20 PM 11/07/2021    7:40 AM 08/27/2022    5:00 PM 08/28/2022   12:00 AM 08/28/2022    9:20 AM  Fall Risk  (RETIRED) Patient Fall Risk Level High  fall risk High fall risk Moderate fall risk Moderate fall risk Moderate fall risk   Functional Status Survey:    Vitals:   11/21/23 2051  BP: (!) 72/50  Pulse: 75  Temp: 97.7 F (36.5 C)  SpO2: (!) 88%   There is no height or weight on file to calculate BMI. Physical Exam Constitutional:      Comments: obtunded  Cardiovascular:     Rate and Rhythm: Normal rate.  Pulmonary:     Effort: Pulmonary effort is normal.  Musculoskeletal:     Comments: 3+ pitting edema hands and feet     Labs reviewed: Recent Labs    10/27/23 0615 11/16/23 0805 11/17/23 0302 11/17/23 0757  NA 131* 130* 130*  --   K 3.8 3.4* 3.1*  --   CL 95* 89* 96*  --   CO2 23 26 25   --   GLUCOSE 207* 229* 124*  --   BUN 68* 29* 35*  --   CREATININE 3.02* 3.16* 3.31*  --   CALCIUM 8.3* 8.3* 8.0*  --   MG  --   --   --  1.8  PHOS  --   --   --  4.2   Recent Labs    11/16/23 0805 11/17/23 0302  AST 38 103*  ALT 28 60*  ALKPHOS 81 56  BILITOT 1.0 1.1  PROT 5.9* 4.6*  ALBUMIN 2.8* 2.0*   Recent Labs    10/26/23 0521 10/27/23 0615 11/16/23 0805 11/17/23 0302  WBC 9.6 9.2 14.8* 18.3*  NEUTROABS 7.6 7.1 11.6*  --   HGB 9.9* 10.6* 10.7* 8.5*  HCT 31.0* 34.1* 34.4* 26.2*  MCV 84.0 86.8 88.0 85.6  PLT 247 249 186 138*   Lab Results  Component Value Date   TSH 0.886 04/19/2012   Lab Results  Component Value Date   HGBA1C 8.9 (H) 10/19/2023   Lab Results  Component Value Date   CHOL 127 11/06/2021   HDL 45 11/06/2021   LDLCALC 67 11/06/2021   TRIG 74 11/06/2021   CHOLHDL 2.8 11/06/2021    Significant Diagnostic Results in last 30 days:  DG Chest Port 1 View  Result Date: 11/16/2023 CLINICAL DATA:  Questionable sepsis - evaluate for abnormality EXAM: PORTABLE CHEST 1 VIEW COMPARISON:  None Available. FINDINGS: Tunneled hemodialysis catheter with tip terminating in the right atrium. Low lung volumes.  The cardiomediastinal silhouette is within normal limits. No pleural effusion. No  pneumothorax. No mass or consolidation. No acute osseous abnormality. IMPRESSION: No acute findings in the chest. Electronically Signed   By: Olive Bass M.D.   On: 11/16/2023 13:13   ECHOCARDIOGRAM COMPLETE  Result Date: 10/24/2023    ECHOCARDIOGRAM REPORT   Patient Name:   Tiffany [name] Date of Exam: 10/24/2023 Medical Rec #:  [id number]        Height:       63.0 in Accession #:    [id number]       Weight:       188.7 lb Date of Birth:  [birth year]        BSA:          1.886 m Patient Age:    89 years         BP:           132/65 mmHg Patient Gender: F                HR:           72 bpm. Exam Location:  [facility] Procedure: 2D Echo Indications:     CHF- Acute Systolic I50.21  History:         Patient has prior history of Echocardiogram examinations.  Sonographer:     Elwin Sleight RDCS Referring Phys:  [id number] Loyce Dys Diagnosing Phys: Julien Nordmann MD  Sonographer Comments: Suboptimal parasternal window, suboptimal apical window and suboptimal subcostal window. Image acquisition challenging due to patient body habitus. IMPRESSIONS  1. Left ventricular ejection fraction, by estimation, is 40 to 45%. Left ventricular ejection fraction by PLAX is 42 %. The left ventricle has mildly decreased function. The left ventricle demonstrates global hypokinesis. Left ventricular diastolic parameters are consistent with Grade I diastolic dysfunction (impaired relaxation).  2. Right ventricular systolic function is normal. The right ventricular size is normal. There is moderately elevated pulmonary artery systolic pressure. The estimated right ventricular systolic pressure is 54.6 mmHg.  3. The mitral valve is normal in structure. Mild mitral valve regurgitation. No evidence of mitral stenosis.  4. The aortic valve has an indeterminant number of cusps. Aortic valve regurgitation is not visualized. Aortic valve sclerosis is present, with no evidence of aortic valve stenosis.  5. The inferior vena cava is normal in size  with greater than 50% respiratory variability, suggesting right atrial pressure of 3 mmHg. FINDINGS  Left Ventricle: Left ventricular ejection fraction, by estimation, is 40 to 45%. Left ventricular ejection fraction by PLAX is 42 %. The left ventricle has mildly decreased function. The left ventricle demonstrates global hypokinesis. The left ventricular internal cavity size was normal in size. There is no left ventricular hypertrophy. Left ventricular diastolic parameters are consistent with Grade I diastolic dysfunction (impaired relaxation). Right Ventricle: The right ventricular size is normal. No increase in right ventricular wall thickness. Right ventricular systolic function is normal. There is moderately elevated pulmonary artery systolic pressure. The tricuspid regurgitant velocity is 3.52 m/s, and with an assumed right atrial pressure of 5 mmHg, the estimated right ventricular systolic pressure is 54.6 mmHg. Left Atrium: Left atrial size was normal in size. Right Atrium: Right atrial size was normal in size. Pericardium: There is no evidence of pericardial effusion. Mitral Valve: The mitral valve is normal in structure. Mild mitral valve regurgitation. No evidence of mitral valve stenosis. Tricuspid Valve: The tricuspid valve is normal in structure. Tricuspid valve regurgitation is mild . No evidence  of tricuspid stenosis. Aortic Valve: The aortic valve has an indeterminant number of cusps. Aortic valve regurgitation is not visualized. Aortic valve sclerosis is present, with no evidence of aortic valve stenosis. Aortic valve peak gradient measures 8.4 mmHg. Pulmonic Valve: The pulmonic valve was normal in structure. Pulmonic valve regurgitation is not visualized. No evidence of pulmonic stenosis. Aorta: The aortic root is normal in size and structure. Venous: The inferior vena cava is normal in size with greater than 50% respiratory variability, suggesting right atrial pressure of 3 mmHg. IAS/Shunts: No  atrial level shunt detected by color flow Doppler.  LEFT VENTRICLE PLAX 2D LV EF:         Left            Diastology                ventricular     LV e' medial:    4.24 cm/s                ejection        LV E/e' medial:  20.1                fraction by     LV e' lateral:   7.83 cm/s                PLAX is 42      LV E/e' lateral: 10.9                %. LVIDd:         3.75 cm LVIDs:         3.00 cm LV PW:         1.25 cm LV IVS:        1.25 cm LVOT diam:     1.90 cm LV SV:         52 LV SV Index:   28 LVOT Area:     2.84 cm  LV Volumes (MOD) LV vol d, MOD    79.9 ml A4C: LV SV MOD A4C:   79.9 ml RIGHT VENTRICLE RV Basal diam:  3.30 cm RV S prime:     17.00 cm/s TAPSE (M-mode): 2.2 cm LEFT ATRIUM             Index        RIGHT ATRIUM           Index LA diam:        3.40 cm 1.80 cm/m   RA Area:     12.30 cm LA Vol (A2C):   48.4 ml 25.66 ml/m  RA Volume:   26.20 ml  13.89 ml/m LA Vol (A4C):   48.4 ml 25.66 ml/m LA Biplane Vol: 48.7 ml 25.82 ml/m  AORTIC VALVE                 PULMONIC VALVE AV Area (Vmax): 1.98 cm     PV Vmax:        1.44 m/s AV Vmax:        144.50 cm/s  PV Peak grad:   8.2 mmHg AV Peak Grad:   8.4 mmHg     RVOT Peak grad: 4 mmHg LVOT Vmax:      101.00 cm/s LVOT Vmean:     59.100 cm/s LVOT VTI:       0.185 m  AORTA Ao Root diam: 2.50 cm Ao Asc diam:  3.60 cm MITRAL VALVE  TRICUSPID VALVE MV Area (PHT): 3.89 cm    TR Peak grad:   49.6 mmHg MV Decel Time: 195 msec    TR Vmax:        352.00 cm/s MV E velocity: 85.40 cm/s MV A velocity: 93.10 cm/s  SHUNTS MV E/A ratio:  0.92        Systemic VTI:  0.18 m                            Systemic Diam: 1.90 cm Julien Nordmann MD Electronically signed by Julien Nordmann MD Signature Date/Time: 10/24/2023/9:32:01 AM    Final     Assessment/Plan Hospice care patient - Plan: LORazepam (ATIVAN) 1 MG tablet  DNR (do not resuscitate) - Plan: LORazepam (ATIVAN) 1 MG tablet  ESRD (end stage renal disease) on dialysis (HCC)  Chronic congestive  heart failure, unspecified heart failure type [facility]) Patient terminal at this time. Adding additional comfort medications given discomfort with care. Continue supportive hospice level of care.     Family/ staff Communication: nursing, spouse

## 2023-11-27 DIAGNOSIS — N186 End stage renal disease: Secondary | ICD-10-CM | POA: Diagnosis not present

## 2023-11-27 DIAGNOSIS — Z992 Dependence on renal dialysis: Secondary | ICD-10-CM | POA: Diagnosis not present

## 2023-11-28 DEATH — deceased

## 2023-12-16 ENCOUNTER — Ambulatory Visit: Payer: PPO | Admitting: Physician Assistant

## 2024-02-10 ENCOUNTER — Ambulatory Visit: Payer: PPO | Admitting: Urology

## 2024-02-17 ENCOUNTER — Ambulatory Visit: Payer: PPO | Admitting: Urology
